# Patient Record
Sex: Male | Born: 1950 | Race: Black or African American | Hispanic: No | Marital: Single | Smoking: Former smoker
Health system: Southern US, Community
[De-identification: ages and names within clinical notes are randomized; demographics above are authoritative.]

## PROBLEM LIST (undated history)

## (undated) DIAGNOSIS — I10 Essential (primary) hypertension: Secondary | ICD-10-CM

## (undated) DIAGNOSIS — E7800 Pure hypercholesterolemia, unspecified: Secondary | ICD-10-CM

## (undated) DIAGNOSIS — M109 Gout, unspecified: Secondary | ICD-10-CM

## (undated) DIAGNOSIS — G43909 Migraine, unspecified, not intractable, without status migrainosus: Secondary | ICD-10-CM

## (undated) DIAGNOSIS — R7303 Prediabetes: Secondary | ICD-10-CM

## (undated) DIAGNOSIS — K219 Gastro-esophageal reflux disease without esophagitis: Secondary | ICD-10-CM

## (undated) DIAGNOSIS — I2699 Other pulmonary embolism without acute cor pulmonale: Secondary | ICD-10-CM

## (undated) DIAGNOSIS — G4733 Obstructive sleep apnea (adult) (pediatric): Secondary | ICD-10-CM

## (undated) HISTORY — PX: BACK SURGERY: SHX140

## (undated) HISTORY — PX: CARDIAC CATHETERIZATION: SHX172

## (undated) HISTORY — PX: APPENDECTOMY: SHX54

## (undated) HISTORY — PX: TONSILLECTOMY: SUR1361

## (undated) HISTORY — PX: NASAL SEPTUM SURGERY: SHX37

## (undated) HISTORY — DX: Obstructive sleep apnea (adult) (pediatric): G47.33

## (undated) HISTORY — PX: TESTICLE SURGERY: SHX794

---

## 2004-05-23 DIAGNOSIS — I2699 Other pulmonary embolism without acute cor pulmonale: Secondary | ICD-10-CM | POA: Insufficient documentation

## 2006-12-28 ENCOUNTER — Other Ambulatory Visit: Payer: Self-pay

## 2006-12-29 ENCOUNTER — Observation Stay: Payer: Self-pay | Admitting: *Deleted

## 2007-01-02 ENCOUNTER — Emergency Department: Payer: Self-pay | Admitting: Emergency Medicine

## 2007-01-02 ENCOUNTER — Other Ambulatory Visit: Payer: Self-pay

## 2007-01-13 ENCOUNTER — Other Ambulatory Visit: Payer: Self-pay

## 2007-01-13 ENCOUNTER — Emergency Department: Payer: Self-pay | Admitting: Emergency Medicine

## 2007-03-16 ENCOUNTER — Encounter (INDEPENDENT_AMBULATORY_CARE_PROVIDER_SITE_OTHER): Payer: Self-pay | Admitting: General Surgery

## 2007-03-16 ENCOUNTER — Observation Stay (HOSPITAL_COMMUNITY): Admission: EM | Admit: 2007-03-16 | Discharge: 2007-03-17 | Payer: Self-pay | Admitting: Family Medicine

## 2007-07-02 ENCOUNTER — Ambulatory Visit: Payer: Self-pay | Admitting: Family Medicine

## 2009-04-26 ENCOUNTER — Ambulatory Visit: Payer: Self-pay | Admitting: Family Medicine

## 2010-10-05 NOTE — Op Note (Signed)
[name] Whitaker, [name] Whitaker             ACCOUNT NO.:  [id number]   MEDICAL RECORD NO.:  [id number]          PATIENT TYPE:  INP   LOCATION:  5703                         FACILITY:  [facility]   PHYSICIAN:  Cherylynn Ridges, M.D.    DATE OF BIRTH:  [birth year]   DATE OF PROCEDURE:  03/16/2007  DATE OF DISCHARGE:                               OPERATIVE REPORT   PREOPERATIVE DIAGNOSIS:  Acute appendicitis.   POSTOPERATIVE DIAGNOSIS:  Acute appendicitis.   PROCEDURE:  Laparoscopic appendectomy.   SURGEON:  Cherylynn Ridges, M.D.   ANESTHESIA:  General endotracheal.   ESTIMATED BLOOD LOSS:  Less than 30 mL.   COMPLICATIONS:  None.   CONDITION:  Stable.   FINDINGS:  Acutely inflamed appendix without perforation with some  periappendiceal inflammation.   INDICATIONS FOR OPERATION:  The patient is a 60 year old gentleman with  a CT diagnosed acute appendicitis and clinical diagnosis to go along  with that who now comes in of laparoscopic appendectomy.   OPERATION:  The patient was taken to the operating room, placed on the  table in the supine position.  After an adequate general endotracheal  anesthetic was administered, he was prepped and draped in the usual  sterile manner, exposing the midline and the right upper quadrant and  the right lower quadrant.  A supraumbilical curvilinear incision was  made using a #11 blade and taking down to the midline fascia.  We were  able to incise superficially into this fascia using a #11 blade, making  an opening into the fascia.  We subsequently bluntly dissected down into  the peritoneal cavity using the surgeon's finger, and then subsequently  passed a pursestring suture of 0 Vicryl around the fascial opening.  This secured in the Hasson cannula which was subsequently passed.  Carbon dioxide gas was insufflated through the Hasson cannula into the  peritoneal cavity up to a maximal intraabdominal pressure of 15 mmHg.   With the Hasson cannula in place  and the carbon dioxide gas in place, we  placed the patient in Trendelenburg with the left side down.  A right  upper quadrant 5-mm cannula and a suprapubic 12-mm cannula passed under  direct vision with all cannulas in place.  We began the dissection.   We were able to manipulate the appendix which was in the right lower  quadrant using a grasper and a dissector.  We were able to dissect out  his adhesions up to the base of the cecum.  The mesoappendix, however,  was subposteriorly and medially, therefore, we decided to cauterize it  away from the appendix itself.  This was done using a dissector, taking  care not to have any significant bleeding.  We cauterized the  mesoappendix thoroughly in order to obtain hemostasis and also detach  the appendix until the base is clear for [place] to pass an Endo-GIA with 3.5-  mm closure or a blue staple across the base of the appendix and detach  it.  We retrieved it from the suprapubic cannula site with minimal  difficulty, then inspected the right lower quadrant for bleeding.  There  was no bleeding noted from the periappendiceal area or the mesoappendix.  We irrigated with about 700 to 800 mL of saline, then we aspirated out  fluid and gas from around the liver and the pelvis and removed all  cannulas.   The supraumbilical site was closed using a pursestring suture which was  in place.  We injected 0.25% Marcaine with epinephrine at all sites,  then we closed the skin at the suprapubic and the supraumbilical site  using a running subcuticular stitch of 5-0 Vicryl.  The lateral cannula  site was closed with Dermabond and we did apply Dermabond to the skin at  the supraumbilical and suprapubic site.  Sterile dressings were applied  including the Steri-Strips and Tegaderm.  Needle counts and sponge  counts and instrument counts were correct.      Cherylynn Ridges, M.D.  Electronically Signed     JOW/MEDQ  D:  03/16/2007  T:  03/16/2007  Job:   [id number]

## 2010-10-05 NOTE — H&P (Signed)
[name] Whitaker, [name] Whitaker             ACCOUNT NO.:  [id number]   MEDICAL RECORD NO.:  [id number]          PATIENT TYPE:  INP   LOCATION:  5703                         FACILITY:  [facility]   PHYSICIAN:  Cherylynn Ridges, M.D.    DATE OF BIRTH:  [birth year]   DATE OF ADMISSION:  03/15/2007  DATE OF DISCHARGE:                              HISTORY & PHYSICAL   IDENTIFICATION/CHIEF COMPLAINT:  The patient is a 60 year old male with  abdominal pain now localized to the right lower quadrant with  tenderness.  The CT scan demonstrating acute appendicitis who comes in  for an appendectomy.   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  He  stayed at work, although he only ate breakfast, did not have nausea or  vomiting.  The pain persisted throughout the day and worsened moving  from an epigastric location to the right lower quadrant and down towards  his thigh as the patient noted.  He had no fever or chills.  No  jaundice.  No diarrhea or constipation, but he did have a decrease in  his appetite and came into the emergency room after being seen in urgent  care where he was found to have acute appendicitis when the CT scan was  done and surgical consultation was obtained.   PAST MEDICAL HISTORY:  1. Hypertension.  2. Hypercholesterolemia.  3. Gout.  4. History of deep venous thrombosis.  5. History of sleep apnea.   PAST SURGICAL HISTORY:  1. He has had a partial palatectomy for sleep apnea.  2. Tonsils and adenoids.  3. Fixation of an ankle fracture several years ago.  4. 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   MEDICATIONS:  Include:  1. Niacin.  2. Simvastatin.  3. Hydrochlorothiazide.  Doses are unknown.   ALLERGIES:  HE IS ALLERGIC TO PENICILLIN AND HE GETS A  RASH.   SOCIAL HISTORY:  The patient is a vice principal.  He is a nonsmoker,  nondrinker.  He does not take any drugs.   REVIEW OF SYSTEMS:  GASTROINTESTINAL:  He has had no diarrhea,  constipation.  No fever or chills.  No nausea or vomiting.  He has had  no blood in his stools.  GENITOURINARY:  He has had no dysuria or  hematuria.   PHYSICAL EXAMINATION:  GENERAL:  He is a well-nourished, well-developed  gentleman, mildly overweight in no acute distress, although  uncomfortable, even at rest.  HEENT:  He is normocephalic and atraumatic and anicteric.  Mucous  membranes are moist and pink.  VITAL SIGNS:  His temperature is 97.6, pulse 71, blood pressure 138/89.  NECK:  His neck is supple with no palpable masses.  He has got no  bruits.  LUNGS:  Clear to auscultation.  No wheezes or rales.  CARDIAC EXAM:  Regular rhythm and rate  without murmurs.  ABDOMEN:  Distended but he has normoactive and perhaps hyperactive bowel  sounds with tenderness in the right lower quadrant.  No palpable masses.  No positive Rovsing sign.  RECTAL:  Rectal exam was not performed.  VASCULAR EXAM:  He has got normal pulses bilaterally.  He is slightly  swollen in the left lower extremity with good pulses.  NEUROLOGICALLY:  Cranial nerves II-XII are grossly intact.   LABORATORY STUDIES:  His white count is 12.8, hemoglobin is 15.8 with an  hematocrit of 47.0.  His electrolytes are within normal limits with the  exception of potassium which is 3.0.  BUN is 14, creatinine of 1.2.  A  12-lead EKG is normal.  Chest x-ray is pending.   IMPRESSION:  Acute appendicitis by CT scan and also clinically with  history and physical examination.   PLAN:  The plan is:  1. Give the patient IV antibiotics preoperatively.  2. Correct his potassium with two runs of K over one hour each.  3. Take him to the operating room for a laparoscopic appendectomy to      be performed as soon as possible.   The patient  understands the risks and benefits and he wishes to proceed.      Cherylynn Ridges, M.D.  Electronically Signed     JOW/MEDQ  D:  03/16/2007  T:  03/16/2007  Job:  [id number]

## 2010-10-05 NOTE — Discharge Summary (Signed)
NAMEBESSIE, [name] Whitaker             ACCOUNT NO.:  [id number]   MEDICAL RECORD NO.:  [id number]          PATIENT TYPE:  INP   LOCATION:  5703                         FACILITY:  [facility]   PHYSICIAN:  Revonda Standard L. Rennis Harding, N.P. DATE OF BIRTH:  1951/01/09   DATE OF ADMISSION:  03/16/2007  DATE OF DISCHARGE:  03/16/2007                               DISCHARGE SUMMARY   DISCHARGING PHYSICIAN:  Dr. Luisa Hart.   CHIEF COMPLAINT/REASON FOR ADMISSION:  [name] Whitaker is a 56-year male  patient, history of hypertension, who developed epigastric pain, later  more focal pain in the right lower quadrant, presented to the ER because  of severity of pain.  Was found to have elevated white count of 12,800.  A CT scan was performed that was consistent with acute appendicitis.  The patient was admitted by Dr. Lindie Spruce with a diagnosis of acute  appendicitis.   HOSPITAL COURSE:  The patient was admitted and taken from the ER to the  OR by Dr. Lindie Spruce, where he underwent a laparoscopic appendectomy for a  nonperforated acute appendicitis.  The patient tolerated the procedure  well and was sent back to the general floor to recover, noting that the  patient procedure's was complete a little after 3 a.m. on October 24th.   At 10:30 a.m. on March 16, 2007, the patient was somewhat groggy, but  doing otherwise well, tolerating a clear liquid diet.  His abdomen was  soft, slightly distended, but bowel sounds were present.  He had no  nausea or vomiting.  He was somewhat tender in the right lower quadrant  dressings were clean, dry and intact.  He was verbalizing that he wanted  to try a solid diet at lunch time.  Plans are if the patient tolerates  oral pain medicines, ambulation and a solid diet, that he can discharge  home later this evening.  Because of the inflammatory residual pain in  the right lower quadrant, the patient has also been given Toradol IV,  first dose starting today, and to continue until  discharged.   FINAL DISCHARGE DIAGNOSES:  1. Acute nonperforated appendicitis.  2. Status post laparoscopic appendectomy.  3. Chronic medical problems consisting of hypertension, sleep apnea,      deep venous thrombosis, dyslipidemia, and gout.   DISCHARGE MEDICATIONS:  The patient will resume his home medications of  allopurinol, aspirin, hydrochlorothiazide, niacin and Zocor.  No doses  available at time of dictation.  In addition, he has been given a  prescription for Vicodin 1-2 tablets every 4-6 hours as needed for pain,  dispensed #30.  In addition, he has been instructed to take over-the-  counter ibuprofen 2 tablets every 8 hours as needed in addition to  Vicodin for pain.  He is to take this with food.  Return to work 1-2  weeks; note specifies out of work 2 weeks, may return in 1 week if not  using Vicodin during the day for pain control.  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  Diet low-sodium/heart-healthy.   WOUND CARE:  Remove bandages in the morning.  May shower.  OTHER ACTIVITY:  Increase activity slowly.  May walk up steps.  No  lifting for 3 weeks.  No driving for 2 weeks.   FOLLOWUP APPOINTMENTS:  He needs to call Dr. Dixon Boos office to be seen  in 2 weeks.   OTHER INSTRUCTIONS:  1. He is to call the surgeon if a fever greater than 101 degrees      Fahrenheit.  2. New or increased belly pain.  3. Redness or drainage from wounds.  4. Nausea, vomiting or diarrhea.   This discharge is pending the patient tolerating advancement of diet,  oral pain medications, and ambulating without difficulty.      Allison L. Rennis Harding, N.P.     ALE/MEDQ  D:  03/16/2007  T:  03/17/2007  Job:  [id number]

## 2010-10-08 NOTE — Discharge Summary (Signed)
[name] Whitaker, [name] Whitaker             ACCOUNT NO.:  [id number]   MEDICAL RECORD NO.:  [id number]          PATIENT TYPE:  INP   LOCATION:  5703                         FACILITY:  [facility]   PHYSICIAN:  Maisie Fus A. Cornett, M.D.DATE OF BIRTH:  [birth year]   DATE OF ADMISSION:  03/15/2007  DATE OF DISCHARGE:  03/17/2007                               DISCHARGE SUMMARY   ADMITTING DIAGNOSIS:  Acute appendicitis.   DISCHARGE DIAGNOSIS:  Acute appendicitis.   PROCEDURE PERFORMED:  Laparoscopic appendectomy.   BRIEF HISTORY:  The patient is a 60 year old male admitted on March 16, 2007 with acute appendicitis. He underwent a laparoscopic  appendectomy by Dr. Jimmye Norman and was admitted to the hospital.   Postop course was unremarkable.  He was discharged home on postop day #1  in satisfactory condition tolerating his diet with no fevers and his  wounds were clean, dry and intact.   DISCHARGE INSTRUCTIONS:  He will follow-up in 1-2 weeks with Dr. Lindie Spruce  as an outpatient.  He will refrain from heavy lifting and straining for  the next 2 weeks and be discharged home on Vicodin for pain.   CONDITION ON DISCHARGE:  Improved.      Thomas A. Cornett, M.D.  Electronically Signed     TAC/MEDQ  D:  04/17/2007  T:  04/17/2007  Job:  [id number]

## 2011-03-02 LAB — URINALYSIS, ROUTINE W REFLEX MICROSCOPIC
Glucose, UA: NEGATIVE
Hgb urine dipstick: NEGATIVE
Ketones, ur: NEGATIVE
Nitrite: NEGATIVE
Specific Gravity, Urine: 1.02
pH: 6.5

## 2011-03-02 LAB — COMPREHENSIVE METABOLIC PANEL
ALT: 21
AST: 20
CO2: 32
Calcium: 8.7
GFR calc Af Amer: >60
GFR calc non Af Amer: >60
Total Protein: 7.1

## 2011-03-02 LAB — DIFFERENTIAL
Basophils Absolute: 0
Basophils Relative: 0
Eosinophils Absolute: 0.1
Lymphocytes Relative: 17
Lymphs Abs: 2.1
Monocytes Relative: 5
Neutro Abs: 9.8 — ABNORMAL HIGH

## 2011-03-02 LAB — CBC
HCT: 47.1
MCHC: 33.2
MCV: 85.8
Platelets: 257
RDW: 15.1 — ABNORMAL HIGH

## 2011-03-02 LAB — LIPASE, BLOOD: Lipase: 19

## 2011-05-05 ENCOUNTER — Emergency Department: Payer: Self-pay | Admitting: Emergency Medicine

## 2011-05-31 ENCOUNTER — Emergency Department: Payer: Self-pay | Admitting: Emergency Medicine

## 2011-05-31 LAB — CBC
HCT: 45.4 % (ref 40.0–52.0)
HGB: 14.7 g/dL (ref 13.0–18.0)
MCH: 29 pg (ref 26.0–34.0)
MCHC: 32.5 g/dL (ref 32.0–36.0)
MCV: 89 fL (ref 80–100)
RBC: 5.09 10*6/uL (ref 4.40–5.90)

## 2011-05-31 LAB — COMPREHENSIVE METABOLIC PANEL
Alkaline Phosphatase: 52 U/L (ref 50–136)
BUN: 15 mg/dL (ref 7–18)
Bilirubin,Total: 0.4 mg/dL (ref 0.2–1.0)
Chloride: 112 mmol/L — ABNORMAL HIGH (ref 98–107)
Creatinine: 1.19 mg/dL (ref 0.60–1.30)
EGFR (African American): >60
Osmolality: 294 (ref 275–301)
SGOT(AST): 16 U/L (ref 15–37)
SGPT (ALT): 20 U/L
Total Protein: 6.2 g/dL — ABNORMAL LOW (ref 6.4–8.2)

## 2011-07-14 ENCOUNTER — Ambulatory Visit: Payer: Self-pay | Admitting: Cardiology

## 2011-07-28 ENCOUNTER — Ambulatory Visit: Payer: Self-pay | Admitting: Cardiology

## 2011-10-24 ENCOUNTER — Emergency Department (HOSPITAL_COMMUNITY)
Admission: EM | Admit: 2011-10-24 | Discharge: 2011-10-24 | Disposition: A | Discharge status: Home / Self Care | Payer: BC Managed Care – PPO | Source: Home / Self Care

## 2011-10-24 ENCOUNTER — Encounter (HOSPITAL_COMMUNITY): Payer: Self-pay | Admitting: Cardiology

## 2011-10-24 ENCOUNTER — Encounter (HOSPITAL_COMMUNITY): Payer: Self-pay | Admitting: Emergency Medicine

## 2011-10-24 ENCOUNTER — Inpatient Hospital Stay (HOSPITAL_COMMUNITY)
Admission: EM | Admit: 2011-10-24 | Discharge: 2011-10-27 | DRG: 541 | Disposition: A | Discharge status: Home / Self Care | Payer: BC Managed Care – PPO | Attending: Internal Medicine | Admitting: Internal Medicine

## 2011-10-24 DIAGNOSIS — I82409 Acute embolism and thrombosis of unspecified deep veins of unspecified lower extremity: Secondary | ICD-10-CM | POA: Diagnosis present

## 2011-10-24 DIAGNOSIS — N182 Chronic kidney disease, stage 2 (mild): Secondary | ICD-10-CM | POA: Diagnosis present

## 2011-10-24 DIAGNOSIS — Z880 Allergy status to penicillin: Secondary | ICD-10-CM

## 2011-10-24 DIAGNOSIS — M109 Gout, unspecified: Secondary | ICD-10-CM | POA: Diagnosis present

## 2011-10-24 DIAGNOSIS — I2699 Other pulmonary embolism without acute cor pulmonale: Principal | ICD-10-CM | POA: Diagnosis present

## 2011-10-24 DIAGNOSIS — I129 Hypertensive chronic kidney disease with stage 1 through stage 4 chronic kidney disease, or unspecified chronic kidney disease: Secondary | ICD-10-CM | POA: Diagnosis present

## 2011-10-24 DIAGNOSIS — R0602 Shortness of breath: Secondary | ICD-10-CM

## 2011-10-24 DIAGNOSIS — I10 Essential (primary) hypertension: Secondary | ICD-10-CM

## 2011-10-24 DIAGNOSIS — E785 Hyperlipidemia, unspecified: Secondary | ICD-10-CM | POA: Diagnosis present

## 2011-10-24 DIAGNOSIS — I82402 Acute embolism and thrombosis of unspecified deep veins of left lower extremity: Secondary | ICD-10-CM

## 2011-10-24 DIAGNOSIS — Z8249 Family history of ischemic heart disease and other diseases of the circulatory system: Secondary | ICD-10-CM

## 2011-10-24 DIAGNOSIS — M7989 Other specified soft tissue disorders: Secondary | ICD-10-CM

## 2011-10-24 DIAGNOSIS — G4733 Obstructive sleep apnea (adult) (pediatric): Secondary | ICD-10-CM | POA: Diagnosis present

## 2011-10-24 DIAGNOSIS — E876 Hypokalemia: Secondary | ICD-10-CM | POA: Diagnosis present

## 2011-10-24 HISTORY — DX: Essential (primary) hypertension: I10

## 2011-10-24 HISTORY — DX: Other pulmonary embolism without acute cor pulmonale: I26.99

## 2011-10-24 HISTORY — DX: Pure hypercholesterolemia, unspecified: E78.00

## 2011-10-24 HISTORY — DX: Migraine, unspecified, not intractable, without status migrainosus: G43.909

## 2011-10-24 HISTORY — DX: Gout, unspecified: M10.9

## 2011-10-24 LAB — CBC
MCH: 29.8 pg (ref 26.0–34.0)
MCHC: 35.6 g/dL (ref 30.0–36.0)
MCV: 83.7 fL (ref 78.0–100.0)
RDW: 13.8 % (ref 11.5–15.5)
WBC: 8.3 10*3/uL (ref 4.0–10.5)

## 2011-10-24 LAB — COMPREHENSIVE METABOLIC PANEL
ALT: 21 U/L (ref 0–53)
AST: 22 U/L (ref 0–37)
Alkaline Phosphatase: 64 U/L (ref 39–117)
BUN: 16 mg/dL (ref 6–23)
Calcium: 9 mg/dL (ref 8.4–10.5)
Chloride: 102 mEq/L (ref 96–112)
Creatinine, Ser: 1.34 mg/dL (ref 0.50–1.35)
GFR calc Af Amer: 65 mL/min — ABNORMAL LOW (ref >90)
Total Protein: 7.3 g/dL (ref 6.0–8.3)

## 2011-10-24 LAB — DIFFERENTIAL
Basophils Absolute: 0 10*3/uL (ref 0.0–0.1)
Lymphocytes Relative: 33 % (ref 12–46)
Lymphs Abs: 2.7 10*3/uL (ref 0.7–4.0)
Neutro Abs: 4.9 10*3/uL (ref 1.7–7.7)

## 2011-10-24 LAB — POCT I-STAT TROPONIN I: Troponin i, poc: 0 ng/mL (ref 0.00–0.08)

## 2011-10-24 NOTE — ED Notes (Signed)
Patient complaining of left leg pain and swelling since yesterday; patient reports history of pulmonary embolism in the past.  Patient also reporting some shortness of breath that began yesterday.  Denies chest pain.

## 2011-10-24 NOTE — ED Provider Notes (Signed)
[name] Whitaker is a 61 y.o. male who presents to Urgent Care today for left leg swelling starting one day ago. Left calf swelling and pain. Patient notes some shortness of breath on exertion. He denies any chest pains or palpitations. He is a pertinent past history for pulmonary embolism several years ago without apparent cause.  He has no injury to his leg and feels well otherwise. Patient is right-handed   PMH reviewed. Significant for past history of pulmonary embolism History  Substance Use Topics  . Smoking status: Never Smoker   . Smokeless tobacco: Not on file  . Alcohol Use: No   ROS as above Medications reviewed. No current facility-administered medications for this encounter.   Current Outpatient Prescriptions  Medication Sig Dispense Refill  . allopurinol (ZYLOPRIM) 300 MG tablet Take 300 mg by mouth daily.      Marland Kitchen aspirin 81 MG tablet Take 81 mg by mouth daily.      . finasteride (PROSCAR) 5 MG tablet Take 5 mg by mouth daily.      Marland Kitchen gabapentin (NEURONTIN) 300 MG capsule Take 300 mg by mouth 2 (two) times daily.      Marland Kitchen loratadine (CLARITIN) 10 MG tablet Take 10 mg by mouth daily.      Marland Kitchen losartan-hydrochlorothiazide (HYZAAR) 100-25 MG per tablet Take 1 tablet by mouth daily.      . niacin 500 MG tablet Take 500 mg by mouth daily with breakfast.      . propranolol (INDERAL) 60 MG tablet Take 60 mg by mouth 2 (two) times daily.      . simvastatin (ZOCOR) 80 MG tablet Take 80 mg by mouth at bedtime.        Exam:  BP 125/78  Pulse 80  Temp(Src) 98.1 F (36.7 C) (Oral)  Resp 18  SpO2 97% Gen: Well NAD HEENT: EOMI,  MMM Lungs: CTABL Nl WOB Heart: RRR no MRG Exts: Non edematous BL  LE, warm and well perfused. Left calf diameter 46 cm right calf diameter 43 cm  No results found for this or any previous visit (from the past 24 hour(s)). No results found.  Assessment and Plan: 61 y.o. male with leg swelling concern for DVT.  Patient's left calf is 3 cm larger than his  right calf, and he is a pertinent past history for DVT. He has mild shortness of breath but no tachycardia tachypnea or decreased oxygen saturation. Pulmonary embolism is a possibility. I discussed the options with this patient and he agrees with transfer to the emergency room for further evaluation.     Rodolph Bong, MD 10/24/11 [phone]

## 2011-10-24 NOTE — ED Notes (Signed)
Pt reports left leg swelling that started yesterday. Calf tenderness that started approx one hour ago. Denies fever. Has a history of pulmonary emboli 06/2004. He had been off coumadin since 12/2004.

## 2011-10-24 NOTE — ED Provider Notes (Signed)
Medical screening examination/treatment/procedure(s) were performed by a resident physician and as supervising physician I was immediately available for consultation/collaboration.  Leslee Home, M.D.   Reuben Likes, MD 10/24/11 [id number]

## 2011-10-24 NOTE — ED Provider Notes (Signed)
History     CSN: [id number]  Arrival date & time 10/24/11  4098   First MD Initiated Contact with Patient 10/24/11 2256      Chief Complaint  Patient presents with  . Leg Pain  . Shortness of Breath    (Consider location/radiation/quality/duration/timing/severity/associated sxs/prior treatment) HPI Comments: 61 year old male with a history of hypertension, hypercholesterolemia and pulmonary embolism in 2006 who presents with recurrent left leg swelling. Onset was yesterday, gradually worsening, associated with shortness of breath. Nothing makes this better or worse, currently anticoagulated with only aspirin. He states he has no chest pain, no back pain, no difficulty ambulating or dyspnea on exertion. He has no other risk factors for pulmonary embolism other than prior pulmonary embolism including no travel, no trauma, no injuries, no immobilization, no hormone therapy, no tobacco use.  He denies fevers, cough, chills, nausea, vomiting, rashes, diarrhea  Patient is a 61 y.o. male presenting with leg pain and shortness of breath. The history is provided by the patient and medical records.  Leg Pain   Shortness of Breath  Associated symptoms include shortness of breath.    Past Medical History  Diagnosis Date  . Gout   . Hypercholesteremia   . Pulmonary embolism   . Hypertension   . Migraine     Past Surgical History  Procedure Date  . Appendectomy   . Nasal septum surgery   . Tonsillectomy   . Testicle surgery   . Cardiac catheterization     History reviewed. No pertinent family history.  History  Substance Use Topics  . Smoking status: Never Smoker   . Smokeless tobacco: Not on file  . Alcohol Use: No      Review of Systems  Respiratory: Positive for shortness of breath.   All other systems reviewed and are negative.    Allergies  Penicillins and Amlodipine  Home Medications   Current Outpatient Rx  Name Route Sig Dispense Refill  . ALLOPURINOL 300  MG PO TABS Oral Take 300 mg by mouth daily.    . ASPIRIN 81 MG PO TABS Oral Take 81 mg by mouth daily.    Marland Kitchen FINASTERIDE 5 MG PO TABS Oral Take 5 mg by mouth daily.    Marland Kitchen GABAPENTIN 300 MG PO CAPS Oral Take 300 mg by mouth 2 (two) times daily.    Marland Kitchen LORATADINE 10 MG PO TABS Oral Take 10 mg by mouth daily.    Marland Kitchen LOSARTAN POTASSIUM-HCTZ 100-25 MG PO TABS Oral Take 1 tablet by mouth daily.    Marland Kitchen NIACIN 500 MG PO TABS Oral Take 500 mg by mouth daily with breakfast.    . PROPRANOLOL HCL 60 MG PO TABS Oral Take 60 mg by mouth 2 (two) times daily.    Marland Kitchen SIMVASTATIN 80 MG PO TABS Oral Take 80 mg by mouth at bedtime.      BP 173/111  Pulse 70  Temp(Src) 98.2 F (36.8 C) (Oral)  Resp 16  Ht 5\' 11"  (1.803 m)  Wt 255 lb (115.667 kg)  BMI 35.57 kg/m2  SpO2 98%  Physical Exam  Nursing note and vitals reviewed. Constitutional: He appears well-developed and well-nourished. No distress.  HENT:  Head: Normocephalic and atraumatic.  Mouth/Throat: Oropharynx is clear and moist. No oropharyngeal exudate.  Eyes: Conjunctivae and EOM are normal. Pupils are equal, round, and reactive to light. Right eye exhibits no discharge. Left eye exhibits no discharge. No scleral icterus.  Neck: Normal range of motion. Neck supple. No JVD present.  No thyromegaly present.  Cardiovascular: Normal rate, regular rhythm, normal heart sounds and intact distal pulses.  Exam reveals no gallop and no friction rub.   No murmur heard. Pulmonary/Chest: Effort normal and breath sounds normal. No respiratory distress. He has no wheezes. He has no rales.  Abdominal: Soft. Bowel sounds are normal. He exhibits no distension and no mass. There is no tenderness.  Musculoskeletal: Normal range of motion. He exhibits edema ( Scant pitting edema of the left lower extremity, there is asymmetry of the lower extremities with left greater than right. There is normal pulses at the left foot including the dorsalis pedis and posterior tibial). He  exhibits no tenderness.  Lymphadenopathy:    He has no cervical adenopathy.  Neurological: He is alert. Coordination normal.       Normal speech, strength, sensation, motor, extraocular movements  Skin: Skin is warm and dry. No rash noted. No erythema.  Psychiatric: He has a normal mood and affect. His behavior is normal.    ED Course  Procedures (including critical care time)  ED ECG REPORT   Date: 10/25/2011   Rate: 73  Rhythm: normal sinus rhythm  QRS Axis: left  Intervals: normal  ST/T Wave abnormalities: nonspecific T wave changes  Conduction Disutrbances:none  Narrative Interpretation:   Old EKG Reviewed: No significant changes since 03/16/2007   Labs Reviewed  COMPREHENSIVE METABOLIC PANEL - Abnormal; Notable for the following:    Potassium 3.4 (*)    Glucose, Bld 132 (*)    GFR calc non Af Amer 56 (*)    GFR calc Af Amer 65 (*)    All other components within normal limits  CBC  DIFFERENTIAL  POCT I-STAT TROPONIN I  APTT  PROTIME-INR   Ct Angio Chest W/cm &/or Wo Cm  10/25/2011  *RADIOLOGY REPORT*  Clinical Data: Shortness of breath and leg swelling.  CT ANGIOGRAPHY CHEST  Technique:  Multidetector CT imaging of the chest using the standard protocol during bolus administration of intravenous contrast. Multiplanar reconstructed images including MIPs were obtained and reviewed to evaluate the vascular anatomy.  Contrast: OMNIPAQUE IOHEXOL 350 MG/ML SOLN  Comparison: None  Findings: The chest wall is unremarkable.  No supraclavicular or axillary lymphadenopathy.  Small scattered lymph nodes are noted. The bony thorax is intact.  Mild degenerative changes involving the thoracic spine.  No destructive bony lesions or spinal canal compromise.  The heart is normal in size.  No pericardial effusion.  No mediastinal or hilar lymphadenopathy.  Small scattered lymph nodes are noted.  There is mild diffuse fusiform enlargement of the ascending aorta with maximal measurement of  4.4 cm at the level of the right main pulmonary artery.  No dissection.  No significant atherosclerotic calcifications.  The esophagus is grossly normal.  The pulmonary arteries are fairly well opacified.   There are small bilateral pulmonary emboli.  Examination of the lung parenchyma demonstrates mild dependent atelectasis but no infiltrates, edema or effusions.  No worrisome pulmonary mass lesions or nodules.  The upper abdomen is unremarkable.  IMPRESSION:  1.  Small bilateral pulmonary emboli. 2.  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 3.  Dependent atelectasis without infiltrate or effusion.  Original Report Authenticated By: P. Loralie Champagne, M.D.     1. Pulmonary embolism   2. DVT (deep venous thrombosis), left       MDM  Overall the patient appears to have no significant cardiac instability, his pulse is 75, respirations are  unlabored and even at 18, sats are 97% on room air and blood pressure is normal, temperature is normal. EKG shows nonspecific T waves, there is no significant change from prior EKG from October 2008. Laboratory data shows normal blood counts, slight elevated creatinine 1.3, patient will be hydrated and had a CT angiogram of his chest.  The patient had undergone anticoagulation therapy for 6 months in 2006, and none since that time.  I have personally evaluated the CT scan and find multiple PE's.  Will d/w hospitalist re: admission.    Heparin gtt ordered.  CC provided for deep venous thrombosis with venous thromboembolism pulmonary embolus.  D/w Toniann Fail - will admit.  CRITICAL CARE Performed by: Vida Roller   Total critical care time: 35  Critical care time was exclusive of separately billable procedures and treating other patients.  Critical care was necessary to treat or prevent imminent or life-threatening deterioration.  Critical care was time spent personally by me on the following activities:  development of treatment plan with patient and/or surrogate as well as nursing, discussions with consultants, evaluation of patient's response to treatment, examination of patient, obtaining history from patient or surrogate, ordering and performing treatments and interventions, ordering and review of laboratory studies, ordering and review of radiographic studies, pulse oximetry and re-evaluation of patient's condition.       Vida Roller, MD 10/25/11 [id number]

## 2011-10-25 ENCOUNTER — Encounter (HOSPITAL_COMMUNITY): Payer: Self-pay | Admitting: Internal Medicine

## 2011-10-25 ENCOUNTER — Emergency Department (HOSPITAL_COMMUNITY): Payer: BC Managed Care – PPO

## 2011-10-25 DIAGNOSIS — I10 Essential (primary) hypertension: Secondary | ICD-10-CM

## 2011-10-25 DIAGNOSIS — E782 Mixed hyperlipidemia: Secondary | ICD-10-CM

## 2011-10-25 DIAGNOSIS — I2699 Other pulmonary embolism without acute cor pulmonale: Secondary | ICD-10-CM

## 2011-10-25 DIAGNOSIS — M109 Gout, unspecified: Secondary | ICD-10-CM | POA: Diagnosis present

## 2011-10-25 DIAGNOSIS — M7989 Other specified soft tissue disorders: Secondary | ICD-10-CM

## 2011-10-25 DIAGNOSIS — E785 Hyperlipidemia, unspecified: Secondary | ICD-10-CM | POA: Diagnosis present

## 2011-10-25 HISTORY — DX: Essential (primary) hypertension: I10

## 2011-10-25 LAB — APTT: aPTT: 29 seconds (ref 24–37)

## 2011-10-25 LAB — COMPREHENSIVE METABOLIC PANEL
Albumin: 3.7 g/dL (ref 3.5–5.2)
Alkaline Phosphatase: 60 U/L (ref 39–117)
BUN: 14 mg/dL (ref 6–23)
CO2: 25 mEq/L (ref 19–32)
Chloride: 102 mEq/L (ref 96–112)
Creatinine, Ser: 1.21 mg/dL (ref 0.50–1.35)
GFR calc non Af Amer: 64 mL/min — ABNORMAL LOW (ref >90)
Potassium: 3.1 mEq/L — ABNORMAL LOW (ref 3.5–5.1)
Total Bilirubin: 0.6 mg/dL (ref 0.3–1.2)

## 2011-10-25 LAB — CBC
MCH: 29 pg (ref 26.0–34.0)
MCV: 85 fL (ref 78.0–100.0)
Platelets: 206 10*3/uL (ref 150–400)
RDW: 14.2 % (ref 11.5–15.5)
WBC: 9.5 10*3/uL (ref 4.0–10.5)

## 2011-10-25 LAB — PROTIME-INR: INR: 1.11 (ref 0.00–1.49)

## 2011-10-25 LAB — HEPARIN LEVEL (UNFRACTIONATED): Heparin Unfractionated: 0.57 IU/mL (ref 0.30–0.70)

## 2011-10-25 MED ORDER — PROPRANOLOL HCL 60 MG PO TABS
60.0000 mg | ORAL_TABLET | ORAL | Status: AC
  Filled 2011-10-25: qty 1

## 2011-10-25 MED ORDER — LORATADINE 10 MG PO TABS
10.0000 mg | ORAL_TABLET | Freq: Every day | ORAL | Status: DC
  Administered 2011-10-25 – 2011-10-27 (×3): 10 mg via ORAL
  Filled 2011-10-25 (×3): qty 1

## 2011-10-25 MED ORDER — SODIUM CHLORIDE 0.9 % IV SOLN
INTRAVENOUS | Status: AC
  Administered 2011-10-25: 04:00:00 via INTRAVENOUS

## 2011-10-25 MED ORDER — WARFARIN - PHARMACIST DOSING INPATIENT: Freq: Every day | Status: DC

## 2011-10-25 MED ORDER — KETOROLAC TROMETHAMINE 60 MG/2ML IM SOLN
30.0000 mg | Freq: Once | INTRAMUSCULAR | Status: AC
  Administered 2011-10-25: 30 mg via INTRAMUSCULAR
  Filled 2011-10-25: qty 2

## 2011-10-25 MED ORDER — ATORVASTATIN CALCIUM 40 MG PO TABS
40.0000 mg | ORAL_TABLET | Freq: Every day | ORAL | Status: DC
  Administered 2011-10-25 – 2011-10-26 (×2): 40 mg via ORAL
  Filled 2011-10-25 (×3): qty 1

## 2011-10-25 MED ORDER — ONDANSETRON HCL 4 MG PO TABS: 4.0000 mg | ORAL_TABLET | Freq: Four times a day (QID) | ORAL | Status: DC | PRN

## 2011-10-25 MED ORDER — PROPRANOLOL HCL 60 MG PO TABS
60.0000 mg | ORAL_TABLET | Freq: Two times a day (BID) | ORAL | Status: DC
  Administered 2011-10-25 – 2011-10-27 (×5): 60 mg via ORAL
  Filled 2011-10-25 (×7): qty 1

## 2011-10-25 MED ORDER — FINASTERIDE 5 MG PO TABS
5.0000 mg | ORAL_TABLET | Freq: Every day | ORAL | Status: DC
  Administered 2011-10-25 – 2011-10-27 (×3): 5 mg via ORAL
  Filled 2011-10-25 (×3): qty 1

## 2011-10-25 MED ORDER — HYDRALAZINE HCL 20 MG/ML IJ SOLN
10.0000 mg | INTRAMUSCULAR | Status: DC | PRN
  Filled 2011-10-25: qty 0.5

## 2011-10-25 MED ORDER — ACETAMINOPHEN 325 MG PO TABS
650.0000 mg | ORAL_TABLET | Freq: Four times a day (QID) | ORAL | Status: DC | PRN
  Administered 2011-10-25: 650 mg via ORAL
  Filled 2011-10-25: qty 2

## 2011-10-25 MED ORDER — GABAPENTIN 300 MG PO CAPS
300.0000 mg | ORAL_CAPSULE | ORAL | Status: AC
  Filled 2011-10-25: qty 1

## 2011-10-25 MED ORDER — ACETAMINOPHEN 650 MG RE SUPP: 650.0000 mg | Freq: Four times a day (QID) | RECTAL | Status: DC | PRN

## 2011-10-25 MED ORDER — ONDANSETRON HCL 4 MG/2ML IJ SOLN: 4.0000 mg | Freq: Four times a day (QID) | INTRAMUSCULAR | Status: DC | PRN

## 2011-10-25 MED ORDER — NIACIN 500 MG PO TABS
500.0000 mg | ORAL_TABLET | Freq: Every day | ORAL | Status: DC
  Administered 2011-10-25 – 2011-10-27 (×3): 500 mg via ORAL
  Filled 2011-10-25 (×5): qty 1

## 2011-10-25 MED ORDER — HYDROCHLOROTHIAZIDE 25 MG PO TABS
25.0000 mg | ORAL_TABLET | Freq: Every day | ORAL | Status: DC
  Administered 2011-10-25 – 2011-10-27 (×3): 25 mg via ORAL
  Filled 2011-10-25 (×3): qty 1

## 2011-10-25 MED ORDER — IOHEXOL 350 MG/ML SOLN
100.0000 mL | Freq: Once | INTRAVENOUS | Status: AC | PRN
  Administered 2011-10-25: 100 mL via INTRAVENOUS

## 2011-10-25 MED ORDER — HEPARIN BOLUS VIA INFUSION
4000.0000 [IU] | Freq: Once | INTRAVENOUS | Status: AC
  Administered 2011-10-25: 4000 [IU] via INTRAVENOUS

## 2011-10-25 MED ORDER — LOSARTAN POTASSIUM 50 MG PO TABS
100.0000 mg | ORAL_TABLET | Freq: Every day | ORAL | Status: DC
  Administered 2011-10-25 – 2011-10-27 (×3): 100 mg via ORAL
  Filled 2011-10-25 (×3): qty 2

## 2011-10-25 MED ORDER — LOSARTAN POTASSIUM-HCTZ 100-25 MG PO TABS: 1.0000 | ORAL_TABLET | Freq: Every day | ORAL | Status: DC

## 2011-10-25 MED ORDER — WARFARIN SODIUM 10 MG PO TABS
10.0000 mg | ORAL_TABLET | Freq: Once | ORAL | Status: AC
  Administered 2011-10-25: 10 mg via ORAL
  Filled 2011-10-25: qty 1

## 2011-10-25 MED ORDER — HEPARIN (PORCINE) IN NACL 100-0.45 UNIT/ML-% IJ SOLN
1300.0000 [IU]/h | INTRAMUSCULAR | Status: AC
  Administered 2011-10-25: 1500 [IU]/h via INTRAVENOUS
  Administered 2011-10-25: 1600 [IU]/h via INTRAVENOUS
  Administered 2011-10-26: 1300 [IU]/h via INTRAVENOUS
  Filled 2011-10-25 (×4): qty 250

## 2011-10-25 MED ORDER — ALLOPURINOL 300 MG PO TABS
300.0000 mg | ORAL_TABLET | Freq: Every day | ORAL | Status: DC
  Administered 2011-10-25 – 2011-10-27 (×3): 300 mg via ORAL
  Filled 2011-10-25 (×3): qty 1

## 2011-10-25 MED ORDER — POTASSIUM CHLORIDE CRYS ER 20 MEQ PO TBCR
40.0000 meq | EXTENDED_RELEASE_TABLET | Freq: Once | ORAL | Status: AC
  Administered 2011-10-25: 40 meq via ORAL
  Filled 2011-10-25: qty 2

## 2011-10-25 MED ORDER — ONDANSETRON HCL 4 MG/2ML IJ SOLN: 4.0000 mg | Freq: Three times a day (TID) | INTRAMUSCULAR | Status: DC | PRN

## 2011-10-25 MED ORDER — SODIUM CHLORIDE 0.9 % IV SOLN: INTRAVENOUS | Status: DC

## 2011-10-25 MED ORDER — SODIUM CHLORIDE 0.9 % IJ SOLN
3.0000 mL | Freq: Two times a day (BID) | INTRAMUSCULAR | Status: DC
  Administered 2011-10-25 – 2011-10-26 (×3): 3 mL via INTRAVENOUS

## 2011-10-25 MED ORDER — BIOTENE DRY MOUTH MT LIQD: 15.0000 mL | OROMUCOSAL | Status: DC | PRN

## 2011-10-25 MED ORDER — GABAPENTIN 300 MG PO CAPS
300.0000 mg | ORAL_CAPSULE | Freq: Two times a day (BID) | ORAL | Status: DC
  Administered 2011-10-25 – 2011-10-27 (×5): 300 mg via ORAL
  Filled 2011-10-25 (×7): qty 1

## 2011-10-25 MED ORDER — ENALAPRILAT 1.25 MG/ML IV SOLN
1.2500 mg | Freq: Once | INTRAVENOUS | Status: AC
  Administered 2011-10-25: 1.25 mg via INTRAVENOUS
  Filled 2011-10-25: qty 1

## 2011-10-25 NOTE — Progress Notes (Signed)
ANTICOAGULATION CONSULT NOTE - Initial Consult  Pharmacy Consult for Heparin and Coumadin Indication: pulmonary embolus  Allergies  Allergen Reactions  . Penicillins Shortness Of Breath  . Amlodipine Swelling    Patient Measurements: Height: 5\' 10"  (177.8 cm) Weight: 256 lb 2.8 oz (116.2 kg) IBW/kg (Calculated) : 73  Heparin Dosing Weight: 100  Vital Signs: Temp: 98.5 F (36.9 C) (06/04 0343) Temp src: Oral (06/04 0343) BP: 160/90 mmHg (06/04 0343) Pulse Rate: 95  (06/04 0343)  Labs:  Basename 10/25/11 0102 10/24/11 1945  HGB -- 15.5  HCT -- 43.5  PLT -- 199  APTT 29 --  LABPROT 14.5 --  INR 1.11 --  HEPARINUNFRC -- --  CREATININE -- 1.34  CKTOTAL -- --  CKMB -- --  TROPONINI -- --    Estimated Creatinine Clearance: 75.8 ml/min (by C-G formula based on Cr of 1.34).   Medical History: Past Medical History  Diagnosis Date  . Gout   . Hypercholesteremia   . Pulmonary embolism   . Hypertension   . Migraine     Medications:  Prescriptions prior to admission  Medication Sig Dispense Refill  . allopurinol (ZYLOPRIM) 300 MG tablet Take 300 mg by mouth daily.      Marland Kitchen aspirin 81 MG tablet Take 81 mg by mouth daily.      . finasteride (PROSCAR) 5 MG tablet Take 5 mg by mouth daily.      Marland Kitchen gabapentin (NEURONTIN) 300 MG capsule Take 300 mg by mouth 2 (two) times daily.      Marland Kitchen loratadine (CLARITIN) 10 MG tablet Take 10 mg by mouth daily.      Marland Kitchen losartan-hydrochlorothiazide (HYZAAR) 100-25 MG per tablet Take 1 tablet by mouth daily.      . niacin 500 MG tablet Take 500 mg by mouth daily with breakfast.      . propranolol (INDERAL) 60 MG tablet Take 60 mg by mouth 2 (two) times daily.      . simvastatin (ZOCOR) 80 MG tablet Take 80 mg by mouth at bedtime.        Assessment: 60 yo male with PE for anticoagulation.  Heparin 4000 units IV bolus, 1600 units/hr started in ED at 0130.  Goal of Therapy:  INR 2-3 Heparin level 0.3-0.7 units/ml Monitor platelets by  anticoagulation protocol: Yes   Plan:  Continue Heparin 1600 units/hr Coumadin 10 mg po tonight  Eddie Candle 10/25/2011,4:03 AM

## 2011-10-25 NOTE — ED Notes (Signed)
Patient transported to CT 

## 2011-10-25 NOTE — Progress Notes (Signed)
ANTICOAGULATION CONSULT NOTE - Follow Up Consult  Pharmacy Consult for Heparin Indication: PE  Allergies  Allergen Reactions  . Penicillins Shortness Of Breath  . Amlodipine Swelling    Patient Measurements: Height: 5\' 10"  (177.8 cm) Weight: 256 lb 2.8 oz (116.2 kg) IBW/kg (Calculated) : 73  Heparin Dosing Weight: 98.7kg  Vital Signs: Temp: 98.5 F (36.9 C) (06/04 0343) Temp src: Oral (06/04 0343) BP: 160/90 mmHg (06/04 0343) Pulse Rate: 95  (06/04 0343)  Labs:  Basename 10/25/11 0916 10/25/11 0630 10/25/11 0102 10/24/11 1945  HGB -- 14.7 -- 15.5  HCT -- 43.1 -- 43.5  PLT -- 206 -- 199  APTT -- -- 29 --  LABPROT -- -- 14.5 --  INR -- -- 1.11 --  HEPARINUNFRC 0.72* -- -- --  CREATININE -- 1.21 -- 1.34  CKTOTAL -- -- -- --  CKMB -- -- -- --  TROPONINI -- -- -- --    Estimated Creatinine Clearance: 84 ml/min (by C-G formula based on Cr of 1.21).   Medications:  Heparin 1600 units/hr  Assessment: 61yom on heparin bridging to Coumadin (Day 1 of minimum 5 Day overlap) for CTA confirmed PE. Heparin level (0.72) is just above goal range - will decrease rate and check 6hr follow-up level. Noted possible plans to switch to Xarelto - first dose can be given at time of heparin discontinuation.  - H/H and Plts wnl - No significant bleeding reported  Goal of Therapy:  INR 2-3 Heparin level 0.3-0.7 units/ml Monitor platelets by anticoagulation protocol: Yes   Plan:  1. Decrease heparin drip to 1500 units/hr (15 ml/hr) 2. Check heparin level 6 hours after rate decrease 3. Continue daily heparin level, INR and CBC  Asiah Befort, Dannielle Karvonen 10/25/2011,10:10 AM

## 2011-10-25 NOTE — Progress Notes (Signed)
ANTICOAGULATION CONSULT NOTE - Follow Up Consult  Pharmacy Consult for Heparin Indication: PE  Allergies  Allergen Reactions  . Penicillins Shortness Of Breath  . Amlodipine Swelling    Patient Measurements: Height: 5\' 10"  (177.8 cm) Weight: 256 lb 2.8 oz (116.2 kg) IBW/kg (Calculated) : 73  Heparin Dosing Weight: 98.7kg  Vital Signs: Temp: 98.2 F (36.8 C) (06/04 1407) Temp src: Oral (06/04 1407) BP: 152/93 mmHg (06/04 1407) Pulse Rate: 63  (06/04 1407)  Labs:  Basename 10/25/11 1729 10/25/11 0916 10/25/11 0630 10/25/11 0102 10/24/11 1945  HGB -- -- 14.7 -- 15.5  HCT -- -- 43.1 -- 43.5  PLT -- -- 206 -- 199  APTT -- -- -- 29 --  LABPROT -- -- -- 14.5 --  INR -- -- -- 1.11 --  HEPARINUNFRC 0.57 0.72* -- -- --  CREATININE -- -- 1.21 -- 1.34  CKTOTAL -- -- -- -- --  CKMB -- -- -- -- --  TROPONINI -- -- -- -- --    Estimated Creatinine Clearance: 84 ml/min (by C-G formula based on Cr of 1.21).   Medications:  Heparin 1500 units/hr  Assessment: 61yom on heparin bridging to Coumadin (Day 1 of minimum 5 Day overlap) for CTA confirmed PE. Heparin level (0.72) down to 0.57 now in goal range.  Goal of Therapy:  INR 2-3 Heparin level 0.3-0.7 units/ml Monitor platelets by anticoagulation protocol: Yes   Plan:  Continue heparin at 1500 units/hr and check next heparin level with am labs.  Merilynn Finland, [employer] 10/25/2011,6:51 PM
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
## 2011-10-25 NOTE — Care Management Note (Unsigned)
    Page 1 of 1   10/25/2011     1:39:54 PM   CARE MANAGEMENT NOTE 10/25/2011  Patient:  [name] Whitaker,[name] Whitaker   Account Number:  [id number]  Date Initiated:  10/25/2011  Documentation initiated by:  SIMMONS,Bracha Frankowski  Subjective/Objective Assessment:   ADMITTED WITH PULMONARY EMBOLISM; LIVES AT HOME ALONE, STILL WORKS; HAS FAMILY SUPPORT;  IPTA.     Action/Plan:   DISCHARGE PLANNING INITIATED.   Anticipated DC Date:  10/26/2011   Anticipated DC Plan:  HOME/SELF CARE      DC Planning Services  CM consult      Choice offered to / List presented to:             Status of service:  In process, will continue to follow Medicare Important Message given?   (If response is "NO", the following Medicare IM given date fields will be blank) Date Medicare IM given:   Date Additional Medicare IM given:    Discharge Disposition:    Per UR Regulation:  Reviewed for med. necessity/level of care/duration of stay  If discussed at Long Length of Stay Meetings, dates discussed:    Comments:  10/25/11  1339  [name] Whitaker SIMMONS RN, BSN [phone] MEDICATION IS COVERED, NO AUTH REQUIRED, CO-PAY AT RETAIL $40.00  10/25/11  1042  [name] Whitaker SIMMONS RN, BSN [phone] BENEFITS CHECK IN PROGRESS FOR XARELTO PRICING.

## 2011-10-25 NOTE — Progress Notes (Signed)
Pt placed on dpap of 5cmh2o @ 0540

## 2011-10-25 NOTE — H&P (Signed)
[name] Whitaker is an 61 y.o. male.   PCP - Dr.Javed Mahsoud in [place]. Chief Complaint: Shortness of breath. HPI: 61 year old male with previous history of pulmonary embolism in 2006 presented to the ER because of sudden onset of left leg swelling and shortness of breath. Patient states he noticed the swelling and shortness of breath only from yesterday. He has exertional shortness of breath denies any chest pain fever chills cough or phlegm. He denies any recent travel on a recent surgery. Has had cardiac catheter in February of this year which was normal as per patient. Patient had a CT chest which shows embolism has been admitted for further workup.  Past Medical History  Diagnosis Date  . Gout   . Hypercholesteremia   . Pulmonary embolism   . Hypertension   . Migraine     Past Surgical History  Procedure Date  . Appendectomy   . Nasal septum surgery   . Tonsillectomy   . Testicle surgery   . Cardiac catheterization     Family History  Problem Relation Age of Onset  . Hypertension Daughter    Social History:  reports that he has never smoked. He does not have any smokeless tobacco history on file. He reports that he does not drink alcohol or use illicit drugs.  Allergies:  Allergies  Allergen Reactions  . Penicillins Shortness Of Breath  . Amlodipine Swelling     (Not in a hospital admission)  Results for orders placed during the hospital encounter of 10/24/11 (from the past 48 hour(s))  CBC     Status: Normal   Collection Time   10/24/11  7:45 PM      Component Value Range Comment   WBC 8.3  4.0 - 10.5 (K/uL)    RBC 5.20  4.22 - 5.81 (MIL/uL)    Hemoglobin 15.5  13.0 - 17.0 (g/dL)    HCT 33.8  25.0 - 53.9 (%)    MCV 83.7  78.0 - 100.0 (fL)    MCH 29.8  26.0 - 34.0 (pg)    MCHC 35.6  30.0 - 36.0 (g/dL)    RDW 76.7  34.1 - 93.7 (%)    Platelets 199  150 - 400 (K/uL)   DIFFERENTIAL     Status: Normal   Collection Time   10/24/11  7:45 PM      Component Value  Range Comment   Neutrophils Relative 59  43 - 77 (%)    Neutro Abs 4.9  1.7 - 7.7 (K/uL)    Lymphocytes Relative 33  12 - 46 (%)    Lymphs Abs 2.7  0.7 - 4.0 (K/uL)    Monocytes Relative 4  3 - 12 (%)    Monocytes Absolute 0.4  0.1 - 1.0 (K/uL)    Eosinophils Relative 4  0 - 5 (%)    Eosinophils Absolute 0.3  0.0 - 0.7 (K/uL)    Basophils Relative 0  0 - 1 (%)    Basophils Absolute 0.0  0.0 - 0.1 (K/uL)   COMPREHENSIVE METABOLIC PANEL     Status: Abnormal   Collection Time   10/24/11  7:45 PM      Component Value Range Comment   Sodium 140  135 - 145 (mEq/L)    Potassium 3.4 (*) 3.5 - 5.1 (mEq/L)    Chloride 102  96 - 112 (mEq/L)    CO2 28  19 - 32 (mEq/L)    Glucose, Bld 132 (*) 70 - 99 (mg/dL)  BUN 16  6 - 23 (mg/dL)    Creatinine, Ser 1.47  0.50 - 1.35 (mg/dL)    Calcium 9.0  8.4 - 10.5 (mg/dL)    Total Protein 7.3  6.0 - 8.3 (g/dL)    Albumin 4.0  3.5 - 5.2 (g/dL)    AST 22  0 - 37 (U/L)    ALT 21  0 - 53 (U/L)    Alkaline Phosphatase 64  39 - 117 (U/L)    Total Bilirubin 0.4  0.3 - 1.2 (mg/dL)    GFR calc non Af Amer 56 (*) >90 (mL/min)    GFR calc Af Amer 65 (*) >90 (mL/min)   POCT I-STAT TROPONIN I     Status: Normal   Collection Time   10/24/11  7:58 PM      Component Value Range Comment   Troponin i, poc 0.00  0.00 - 0.08 (ng/mL)    Comment 3            APTT     Status: Normal   Collection Time   10/25/11  1:02 AM      Component Value Range Comment   aPTT 29  24 - 37 (seconds)   PROTIME-INR     Status: Normal   Collection Time   10/25/11  1:02 AM      Component Value Range Comment   Prothrombin Time 14.5  11.6 - 15.2 (seconds)    INR 1.11  0.00 - 1.49     Ct Angio Chest W/cm &/or Wo Cm  10/25/2011  *RADIOLOGY REPORT*  Clinical Data: Shortness of breath and leg swelling.  CT ANGIOGRAPHY CHEST  Technique:  Multidetector CT imaging of the chest using the standard protocol during bolus administration of intravenous contrast. Multiplanar reconstructed images including  MIPs were obtained and reviewed to evaluate the vascular anatomy.  Contrast: OMNIPAQUE IOHEXOL 350 MG/ML SOLN  Comparison: None  Findings: The chest wall is unremarkable.  No supraclavicular or axillary lymphadenopathy.  Small scattered lymph nodes are noted. The bony thorax is intact.  Mild degenerative changes involving the thoracic spine.  No destructive bony lesions or spinal canal compromise.  The heart is normal in size.  No pericardial effusion.  No mediastinal or hilar lymphadenopathy.  Small scattered lymph nodes are noted.  There is mild diffuse fusiform enlargement of the ascending aorta with maximal measurement of 4.4 cm at the level of the right main pulmonary artery.  No dissection.  No significant atherosclerotic calcifications.  The esophagus is grossly normal.  The pulmonary arteries are fairly well opacified.   There are small bilateral pulmonary emboli.  Examination of the lung parenchyma demonstrates mild dependent atelectasis but no infiltrates, edema or effusions.  No worrisome pulmonary mass lesions or nodules.  The upper abdomen is unremarkable.  IMPRESSION:  1.  Small bilateral pulmonary emboli. 2.  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 3.  Dependent atelectasis without infiltrate or effusion.  Original Report Authenticated By: P. Loralie Champagne, M.D.    Review of Systems  Constitutional: Negative.   HENT: Negative.   Respiratory: Positive for shortness of breath.   Cardiovascular: Negative.   Gastrointestinal: Negative.   Genitourinary: Negative.   Musculoskeletal:       LEFT LEG SWELLING.  Skin: Negative.   Neurological: Negative.   Endo/Heme/Allergies: Negative.   Psychiatric/Behavioral: Negative.     Blood pressure 161/112, pulse 67, temperature 98.2 F (36.8 C), temperature source Oral, resp. rate 20, height  5\' 11"  (1.803 m), weight 115.667 kg (255 lb), SpO2 95.00%. Physical Exam  Constitutional: He is oriented  to person, place, and time. He appears well-developed and well-nourished. No distress.  HENT:  Head: Normocephalic and atraumatic.  Right Ear: External ear normal.  Left Ear: External ear normal.  Nose: Nose normal.  Mouth/Throat: Oropharynx is clear and moist. No oropharyngeal exudate.  Eyes: Conjunctivae are normal. Pupils are equal, round, and reactive to light. Right eye exhibits no discharge. Left eye exhibits no discharge. No scleral icterus.  Neck: Normal range of motion. Neck supple.  Cardiovascular: Normal rate and regular rhythm.   Respiratory: Effort normal and breath sounds normal. No respiratory distress. He has no wheezes. He has no rales.  GI: Soft. Bowel sounds are normal. He exhibits no distension. There is no tenderness.  Musculoskeletal: He exhibits edema (Left leg swollen.).  Neurological: He is alert and oriented to person, place, and time.       Moves all extremities.  Skin: He is not diaphoretic.     Assessment/Plan #1. Pulmonary embolism - patient has been started on heparin which we will continue. Add Coumadin per pharmacy. If patient insurance qualifies him for xarelto then may change over to xarelto. Since this is patient's second episode of pulmonary embolism and is being unprovoked patient may need lifelong anticoagulation. Check Doppler of the lower extremity. #2. 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 #3. Hypertension uncontrolled - continue home medications and I have placed patient on when necessary labetolol for systolic blood pressure more than 160. #4. Hyperlipidemia - continue present medications. #5. History of gout - continue present medications.  Patient states he had a normal colonoscopy 7 years ago. This being unprovoked DVT and pulmonary embolism patient may need age-appropriate cancer screening.  CODE STATUS - full code.  Sharese Manrique N. 10/25/2011, 2:55 AM

## 2011-10-25 NOTE — Progress Notes (Signed)
*  PRELIMINARY RESULTS* Vascular Ultrasound Lower extremity venous duplex has been completed.  Preliminary findings: Right= no evidence of DVT. Left= Evidence of DVT involving the peroneal veins.  Farrel Demark RDMS           10/25/2011, 2:27 PM

## 2011-10-26 DIAGNOSIS — I2699 Other pulmonary embolism without acute cor pulmonale: Secondary | ICD-10-CM

## 2011-10-26 DIAGNOSIS — N189 Chronic kidney disease, unspecified: Secondary | ICD-10-CM

## 2011-10-26 DIAGNOSIS — I749 Embolism and thrombosis of unspecified artery: Secondary | ICD-10-CM

## 2011-10-26 DIAGNOSIS — E876 Hypokalemia: Secondary | ICD-10-CM

## 2011-10-26 LAB — PROTIME-INR
INR: 1.14 (ref 0.00–1.49)
Prothrombin Time: 14.8 seconds (ref 11.6–15.2)

## 2011-10-26 LAB — CBC
MCH: 29.4 pg (ref 26.0–34.0)
Platelets: 184 10*3/uL (ref 150–400)
RBC: 4.89 MIL/uL (ref 4.22–5.81)
RDW: 14.2 % (ref 11.5–15.5)
WBC: 6.8 10*3/uL (ref 4.0–10.5)

## 2011-10-26 LAB — BASIC METABOLIC PANEL
CO2: 28 mEq/L (ref 19–32)
Calcium: 8.8 mg/dL (ref 8.4–10.5)
Chloride: 103 mEq/L (ref 96–112)
Creatinine, Ser: 1.38 mg/dL — ABNORMAL HIGH (ref 0.50–1.35)
GFR calc Af Amer: 63 mL/min — ABNORMAL LOW (ref >90)
Sodium: 140 mEq/L (ref 135–145)

## 2011-10-26 MED ORDER — RIVAROXABAN 15 MG PO TABS
15.0000 mg | ORAL_TABLET | Freq: Two times a day (BID) | ORAL | Status: DC
  Administered 2011-10-26 – 2011-10-27 (×3): 15 mg via ORAL
  Filled 2011-10-26 (×4): qty 1

## 2011-10-26 MED ORDER — RIVAROXABAN 10 MG PO TABS: 20.0000 mg | ORAL_TABLET | Freq: Every day | ORAL | Status: DC

## 2011-10-26 MED ORDER — RIVAROXABAN 15 MG PO TABS
15.0000 mg | ORAL_TABLET | Freq: Once | ORAL | Status: AC
  Administered 2011-10-26: 15 mg via ORAL
  Filled 2011-10-26: qty 1

## 2011-10-26 NOTE — Progress Notes (Signed)
ANTICOAGULATION CONSULT NOTE - Follow Up Consult  Pharmacy Consult for Heparin Indication: PE  Allergies  Allergen Reactions  . Penicillins Shortness Of Breath  . Amlodipine Swelling    Patient Measurements: Height: 5\' 10"  (177.8 cm) Weight: 256 lb 2.8 oz (116.2 kg) IBW/kg (Calculated) : 73  Heparin Dosing Weight: 98.7kg  Vital Signs: Temp: 97.5 F (36.4 C) (06/05 0443) Temp src: Oral (06/05 0443) BP: 121/72 mmHg (06/05 0443) Pulse Rate: 54  (06/05 0443)  Labs:  Basename 10/26/11 0513 10/25/11 1729 10/25/11 0916 10/25/11 0630 10/25/11 0102 10/24/11 1945  HGB 14.4 -- -- 14.7 -- --  HCT 41.7 -- -- 43.1 -- 43.5  PLT 184 -- -- 206 -- 199  APTT -- -- -- -- 29 --  LABPROT 14.8 -- -- -- 14.5 --  INR 1.14 -- -- -- 1.11 --  HEPARINUNFRC 0.90* 0.57 0.72* -- -- --  CREATININE 1.38* -- -- 1.21 -- 1.34  CKTOTAL -- -- -- -- -- --  CKMB -- -- -- -- -- --  TROPONINI -- -- -- -- -- --    Estimated Creatinine Clearance: 73.6 ml/min (by C-G formula based on Cr of 1.38).  Assessment: 61 yo male with PE for Heparin  Goal of Therapy:  INR 2-3 Heparin level 0.3-0.7 units/ml Monitor platelets by anticoagulation protocol: Yes   Plan:  Decrease Heparin 1300 units/hr F/U plan for Xarelto  Eddie Candle 10/26/2011,6:55 AM

## 2011-10-26 NOTE — Progress Notes (Signed)
ANTICOAGULATION CONSULT NOTE - Follow Up Consult  Pharmacy Consult for Heparin/Coumadin --> Xarelto Indication: PE  Allergies  Allergen Reactions  . Penicillins Shortness Of Breath  . Amlodipine Swelling    Patient Measurements: Height: 5\' 10"  (177.8 cm) Weight: 256 lb 2.8 oz (116.2 kg) IBW/kg (Calculated) : 73  Heparin Dosing Weight:   Vital Signs: Temp: 97.5 F (36.4 C) (06/05 0443) Temp src: Oral (06/05 0443) BP: 121/72 mmHg (06/05 0443) Pulse Rate: 54  (06/05 0443)  Labs:  Basename 10/26/11 0513 10/25/11 1729 10/25/11 0916 10/25/11 0630 10/25/11 0102 10/24/11 1945  HGB 14.4 -- -- 14.7 -- --  HCT 41.7 -- -- 43.1 -- 43.5  PLT 184 -- -- 206 -- 199  APTT -- -- -- -- 29 --  LABPROT 14.8 -- -- -- 14.5 --  INR 1.14 -- -- -- 1.11 --  HEPARINUNFRC 0.90* 0.57 0.72* -- -- --  CREATININE 1.38* -- -- 1.21 -- 1.34  CKTOTAL -- -- -- -- -- --  CKMB -- -- -- -- -- --  TROPONINI -- -- -- -- -- --    Estimated Creatinine Clearance: 73.6 ml/min (by C-G formula based on Cr of 1.38).   Medications:  Heparin 1300 units/hr  Assessment: 61yom on Heparin bridging to Coumadin for CTA confirmed PE. Pharmacy now consulted to transition patient to Xarelto for PE treatment. Confirmed plan with Dr. Waymon Amato - patient agrees to oupatient Xarelto cost. First dose of Xarelto should be given at the time of Heparin drip discontinuation and when the INR is < 3.  - INR 1.14 - H/H and Plts wnl - AST/ALT wnl - CrCl 74 ml/min - No significant bleeding reported   Plan:  1. Discontinue Heparin drip and Coumadin now 2. Xarelto 15mg  BID with food x 3 weeks, then 20mg  once daily with food - first dose given at time of Heparin drip discontinuation 3. Monitor CBC and s/s bleeding  Cleon Dew [phone] 10/26/2011,1:00 PM

## 2011-10-26 NOTE — Plan of Care (Signed)
Problem: Phase I Progression Outcomes Goal: Discharge plan established Outcome: Completed/Met Date Met:  10/26/11 Home with daughters help

## 2011-10-26 NOTE — Progress Notes (Signed)
Subjective:   Left leg pain has improved. Denies pain while lying in bed but still complains of sharp pains on weightbearing. Has ambulated to the bathroom x1. No bleeding. Occasional dyspnea on exertion.  Objective  Vital signs in last 24 hours: Filed Vitals:   10/25/11 0343 10/25/11 1407 10/25/11 2018 10/26/11 0443  BP: 160/90 152/93 130/97 121/72  Pulse: 95 63 64 54  Temp: 98.5 F (36.9 C) 98.2 F (36.8 C) 98 F (36.7 C) 97.5 F (36.4 C)  TempSrc: Oral Oral Oral Oral  Resp: 18 18 18 17   Height: 5\' 10"  (1.778 m)     Weight: 116.2 kg (256 lb 2.8 oz)     SpO2: 95% 95% 98% 96%   Weight change:   Intake/Output Summary (Last 24 hours) at 10/26/11 1157 Last data filed at 10/25/11 1732  Gross per 24 hour  Intake 829.17 ml  Output    725 ml  Net 104.17 ml    Physical Exam:  General Exam: Comfortable.  Respiratory System: Clear. No increased work of breathing.  Cardiovascular System: First and second heart sounds heard. Regular rate and rhythm. No JVD/murmurs. Telemetry shows sinus bradycardia in the 50s to sinus rhythm without any arrhythmia alarms. Gastrointestinal System: Abdomen is non distended, soft and normal bowel sounds heard. Nontender. Central Nervous System: Alert and oriented. No focal neurological deficits. Extremities: Left leg (below-knee) mildly asymmetrically swollen and warm compared to the right (swelling is decreasing and calf seems to be less tight compared to 6/4). No redness or color change. Peripheral pulses symmetrically well felt.  Labs:  Basic Metabolic Panel:  Lab 10/26/11 0981 10/25/11 0630 10/24/11 1945  NA 140 141 140  K 3.6 3.1* 3.4*  CL 103 102 102  CO2 28 25 28   GLUCOSE 117* 122* 132*  BUN 16 14 16   CREATININE 1.38* 1.21 1.34  CALCIUM 8.8 8.7 9.0  ALB -- -- --  PHOS -- -- --   Liver Function Tests:  Lab 10/25/11 0630 10/24/11 1945  AST 17 22  ALT 18 21  ALKPHOS 60 64  BILITOT 0.6 0.4  PROT 6.9 7.3  ALBUMIN 3.7 4.0   No  results found for this basename: LIPASE:3,AMYLASE:3 in the last 168 hours No results found for this basename: AMMONIA:3 in the last 168 hours CBC:  Lab 10/26/11 0513 10/25/11 0630 10/24/11 1945  WBC 6.8 9.5 8.3  NEUTROABS -- -- 4.9  HGB 14.4 14.7 15.5  HCT 41.7 43.1 43.5  MCV 85.3 85.0 83.7  PLT 184 206 199   Cardiac Enzymes: No results found for this basename: CKTOTAL:5,CKMB:5,CKMBINDEX:5,TROPONINI:5 in the last 168 hours CBG: No results found for this basename: GLUCAP:5 in the last 168 hours  Iron Studies: No results found for this basename: IRON,TIBC,TRANSFERRIN,FERRITIN in the last 72 hours Studies/Results: Ct Angio Chest W/cm &/or Wo Cm  10/25/2011  *RADIOLOGY REPORT*  Clinical Data: Shortness of breath and leg swelling.  CT ANGIOGRAPHY CHEST  Technique:  Multidetector CT imaging of the chest using the standard protocol during bolus administration of intravenous contrast. Multiplanar reconstructed images including MIPs were obtained and reviewed to evaluate the vascular anatomy.  Contrast: OMNIPAQUE IOHEXOL 350 MG/ML SOLN  Comparison: None  Findings: The chest wall is unremarkable.  No supraclavicular or axillary lymphadenopathy.  Small scattered lymph nodes are noted. The bony thorax is intact.  Mild degenerative changes involving the thoracic spine.  No destructive bony lesions or spinal canal compromise.  The heart is normal in size.  No pericardial  effusion.  No mediastinal or hilar lymphadenopathy.  Small scattered lymph nodes are noted.  There is mild diffuse fusiform enlargement of the ascending aorta with maximal measurement of 4.4 cm at the level of the right main pulmonary artery.  No dissection.  No significant atherosclerotic calcifications.  The esophagus is grossly normal.  The pulmonary arteries are fairly well opacified.   There are small bilateral pulmonary emboli.  Examination of the lung parenchyma demonstrates mild dependent atelectasis but no infiltrates, edema or  effusions.  No worrisome pulmonary mass lesions or nodules.  The upper abdomen is unremarkable.  IMPRESSION:  1.  Small bilateral pulmonary emboli. 2.  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 3.  Dependent atelectasis without infiltrate or effusion.  Original Report Authenticated By: P. Loralie Champagne, M.D.   Medications:    . heparin 1,300 Units/hr (10/26/11 0927)      . sodium chloride   Intravenous STAT  . allopurinol  300 mg Oral Daily  . atorvastatin  40 mg Oral q1800  . finasteride  5 mg Oral Daily  . gabapentin  300 mg Oral BID  . hydrochlorothiazide  25 mg Oral Daily  . loratadine  10 mg Oral Daily  . losartan  100 mg Oral Daily  . niacin  500 mg Oral Q breakfast  . propranolol  60 mg Oral BID  . sodium chloride  3 mL Intravenous Q12H  . warfarin  10 mg Oral ONCE-1800  . Warfarin - Pharmacist Dosing Inpatient   Does not apply q1800    I  have reviewed scheduled and prn medications.     Problem/Plan: Principal Problem:  *Pulmonary embolism Active Problems:  HTN (hypertension)  Hyperlipidemia  Gout  1. Acute bilateral pulmonary embolism, secondary to left lower extremity DVT: History of pulmonary embolism in 2006. Currently on IV heparin and Coumadin per pharmacy. Will switch to Xarelto. Recommend outpatient hypercoagulable workup in a couple of months and hematology consultation. He may need prolonged/lifelong anticoagulation. 2. Left Peroneal veins DVT: Management as above. 3. Hypokalemia: Likely secondary to diuretics. Repleted. 4. Possible chronic kidney disease: Stable. Outpatient followup. 5. Hypertension: Continue propranolol, losartan and HCTZ. Monitor. Reasonably controlled. 6. History of gout: Continue allopurinol. 7. History of OSA: On CPAP. 8. History of hyperlipidemia: Continue statins and niacin. 9. 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 Outpatient followup.  Disposition:  Ambulate patient and possible discharge 10/27/11.  Rhen Kawecki 10/26/2011,11:57 AM  LOS: 2 days

## 2011-10-27 DIAGNOSIS — N189 Chronic kidney disease, unspecified: Secondary | ICD-10-CM

## 2011-10-27 DIAGNOSIS — I82409 Acute embolism and thrombosis of unspecified deep veins of unspecified lower extremity: Secondary | ICD-10-CM

## 2011-10-27 DIAGNOSIS — I749 Embolism and thrombosis of unspecified artery: Secondary | ICD-10-CM

## 2011-10-27 DIAGNOSIS — G4733 Obstructive sleep apnea (adult) (pediatric): Secondary | ICD-10-CM | POA: Diagnosis present

## 2011-10-27 DIAGNOSIS — E876 Hypokalemia: Secondary | ICD-10-CM

## 2011-10-27 DIAGNOSIS — Z9989 Dependence on other enabling machines and devices: Secondary | ICD-10-CM | POA: Diagnosis present

## 2011-10-27 DIAGNOSIS — I2699 Other pulmonary embolism without acute cor pulmonale: Secondary | ICD-10-CM

## 2011-10-27 HISTORY — DX: Acute embolism and thrombosis of unspecified deep veins of unspecified lower extremity: I82.409

## 2011-10-27 LAB — CBC
MCV: 84.4 fL (ref 78.0–100.0)
Platelets: 196 10*3/uL (ref 150–400)
RBC: 5.14 MIL/uL (ref 4.22–5.81)
RDW: 14 % (ref 11.5–15.5)
WBC: 7.2 10*3/uL (ref 4.0–10.5)

## 2011-10-27 MED ORDER — RIVAROXABAN 20 MG PO TABS: 20.0000 mg | ORAL_TABLET | Freq: Every day | ORAL | Status: DC

## 2011-10-27 MED ORDER — RIVAROXABAN 15 MG PO TABS: 15.0000 mg | ORAL_TABLET | Freq: Two times a day (BID) | ORAL | Status: DC
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
## 2011-10-27 NOTE — Discharge Instructions (Signed)
Coronary Artery Disease, Risk Factors Research has shown that the risk of developing coronary artery disease (CAD) and having a heart attack increases with each factor you have. RISK FACTORS YOU CANNOT CHANGE  Your age. Your risk goes up as you get older. Most heart attacks happen to people over the age of 65.   Gender. Men have a greater risk of heart attack than women, and they have attacks earlier in life. However, women are more likely to die from a heart attack.   Heredity. Children of parents with heart disease are more likely to develop it themselves.   Race. African Americans and other ethnic groups have a higher risk, possibly because of high blood pressure, a tendency toward obesity, and diabetes.   Your family. Most people with a strong family history of heart disease have one or more other risk factors.  RISK FACTORS YOU CAN CHANGE  Exposure to tobacco smoke. Even secondhand smoke greatly increases the risk for heart disease.   High blood cholesterol may be lowered with changes in diet, activity, and medicines.   High blood pressure makes the heart work harder. This causes the heart muscles to become thick and, eventually, weaker. It also increases your risk of stroke, heart attack, and kidney or heart failure.   Physical inactivity is a risk factor for CAD. Regular physical activity helps prevent heart and blood vessel disease. Exercise helps control blood cholesterol, diabetes, obesity, and it may help lower blood pressure in some people.   Excess body fat, especially belly fat, increases the risk of heart disease and stroke even if there are no other risk factors. Excess weight increases the heart's workload and raises blood pressure and blood cholesterol.   Diabetes seriously increases your risk of developing CAD. If you have diabetes, you should work with your caregiver to manage it and control other risk factors.  OTHER RISK FACTORS FOR CAD  How you respond to stress.     Drinking too much alcohol may raise blood pressure, cause heart failure, and lead to stroke.   Total cholesterol greater than 200 milligrams.   HDL (good) cholesterol less than 40 milligrams. HDL helps keep cholesterol from building up in the walls of the arteries.  PREVENTING CAD  Maintain a healthy weight.   Exercise or do physical activity.   Eat a heart-healthy diet low in fat and salt and high in fiber.   Control your blood pressure to keep it below 120 over 80.   Keep your cholesterol at a level that lowers your risk.   Manage diabetes if you have it.   Stop smoking.   Learn how to manage stress.  HEART SMART SUBSTITUTIONS  Instead of whole or 2% milk and cream, use skim milk.   Instead of fried foods, eat baked, steamed, boiled, broiled, or microwaved foods.   Instead of lard, butter, palm and coconut oils, cook with unsaturated vegetable oils, such as corn, olive, canola, safflower, sesame, soybean, sunflower, or peanut.   Instead of fatty cuts of meat, eat lean cuts of meat or cut off the fatty parts.   Instead of 1 whole egg in recipes, use 2 egg whites.   Instead of sauces, butter, and salt, season vegetables with herbs and spices.   Instead of regular hard and processed cheeses, eat low-fat, low-sodium cheeses.   Instead of salted potato chips, choose low-fat, unsalted tortilla and potato chips and unsalted pretzels and popcorn.   Instead of sour cream and mayonnaise, use plain   low-fat yogurt, low-fat cottage cheese, or low-fat or "light" sour cream.  FOR MORE INFORMATION  National Heart Lung and Blood Institute: www.nhlbi.nih.gov/health/hearttruth American Heart Association: www.heart.org/HEARTORG Document Released: 07/30/2003 Document Revised: 04/28/2011 Document Reviewed: 07/25/2007 Exit[employer] Patient Information 2012 [employer]. 

## 2011-10-27 NOTE — Progress Notes (Signed)
10/26/2011 23:55 Patient placed on CPAP 5 with 21% Fio2. He has a nasal mask on and tolerates very well at this time. All vital signs are stable. RT will continue to monitor. Thelma Barge Florabelle Cardin RRT,RCP

## 2011-11-10 ENCOUNTER — Ambulatory Visit: Payer: Self-pay | Admitting: Internal Medicine

## 2011-11-17 ENCOUNTER — Observation Stay: Payer: Self-pay | Admitting: Internal Medicine

## 2011-11-17 LAB — BASIC METABOLIC PANEL
Anion Gap: 5 — ABNORMAL LOW (ref 7–16)
BUN: 14 mg/dL (ref 7–18)
Calcium, Total: 8.6 mg/dL (ref 8.5–10.1)
Chloride: 105 mmol/L (ref 98–107)
Co2: 32 mmol/L (ref 21–32)
Creatinine: 1.46 mg/dL — ABNORMAL HIGH (ref 0.60–1.30)
EGFR (African American): >60
EGFR (Non-African Amer.): 52 — ABNORMAL LOW
Glucose: 133 mg/dL — ABNORMAL HIGH (ref 65–99)
Osmolality: 286 (ref 275–301)
Potassium: 3.7 mmol/L (ref 3.5–5.1)
Sodium: 142 mmol/L (ref 136–145)

## 2011-11-17 LAB — APTT: Activated PTT: 39.4 secs — ABNORMAL HIGH (ref 23.6–35.9)

## 2011-11-17 LAB — CBC
HGB: 15 g/dL (ref 13.0–18.0)
MCH: 29.6 pg (ref 26.0–34.0)
MCHC: 33.7 g/dL (ref 32.0–36.0)
MCV: 88 fL (ref 80–100)
RBC: 5.08 10*6/uL (ref 4.40–5.90)
WBC: 5.6 10*3/uL (ref 3.8–10.6)

## 2011-11-17 LAB — HEMOGLOBIN: HGB: 14.8 g/dL (ref 13.0–18.0)

## 2011-11-17 LAB — HEPATIC FUNCTION PANEL A (ARMC)
Albumin: 4 g/dL (ref 3.4–5.0)
Bilirubin, Direct: <0.1 mg/dL (ref 0.00–0.20)
SGPT (ALT): 32 U/L
Total Protein: 7.2 g/dL (ref 6.4–8.2)

## 2011-11-17 LAB — TROPONIN I
Troponin-I: <0.02 ng/mL
Troponin-I: <0.02 ng/mL

## 2011-11-17 LAB — PROTIME-INR
INR: 1.8
Prothrombin Time: 21.1 secs — ABNORMAL HIGH (ref 11.5–14.7)

## 2011-11-17 LAB — CK TOTAL AND CKMB (NOT AT ARMC)
CK, Total: 363 U/L — ABNORMAL HIGH (ref 35–232)
CK-MB: 1 ng/mL (ref 0.5–3.6)
CK-MB: 0.6 ng/mL (ref 0.5–3.6)

## 2011-11-18 LAB — BASIC METABOLIC PANEL
Anion Gap: 7 (ref 7–16)
BUN: 14 mg/dL (ref 7–18)
Chloride: 106 mmol/L (ref 98–107)
Creatinine: 1.67 mg/dL — ABNORMAL HIGH (ref 0.60–1.30)
EGFR (African American): 51 — ABNORMAL LOW
EGFR (Non-African Amer.): 44 — ABNORMAL LOW

## 2011-11-18 LAB — CBC WITH DIFFERENTIAL/PLATELET
Basophil #: 0 10*3/uL (ref 0.0–0.1)
Basophil %: 0.4 %
HCT: 43.1 % (ref 40.0–52.0)
Lymphocyte #: 2.7 10*3/uL (ref 1.0–3.6)
Lymphocyte %: 42.5 %
MCHC: 33 g/dL (ref 32.0–36.0)
MCV: 88 fL (ref 80–100)
Neutrophil #: 2.9 10*3/uL (ref 1.4–6.5)
Neutrophil %: 45.4 %
Platelet: 180 10*3/uL (ref 150–440)

## 2011-11-18 LAB — CK TOTAL AND CKMB (NOT AT ARMC): CK-MB: 0.9 ng/mL (ref 0.5–3.6)

## 2011-11-18 LAB — LIPID PANEL: HDL Cholesterol: 23 mg/dL — ABNORMAL LOW (ref 40–60)

## 2011-12-01 ENCOUNTER — Ambulatory Visit: Payer: Self-pay | Admitting: Internal Medicine

## 2011-12-16 ENCOUNTER — Telehealth: Payer: Self-pay | Admitting: Internal Medicine

## 2011-12-16 NOTE — Telephone Encounter (Signed)
S/w pt re appt for 8/6 @ 9:30 am. Date per pt - cannot come 7/31.

## 2011-12-27 ENCOUNTER — Telehealth: Payer: Self-pay | Admitting: *Deleted

## 2011-12-27 ENCOUNTER — Other Ambulatory Visit: Payer: BC Managed Care – PPO | Admitting: Lab

## 2011-12-27 ENCOUNTER — Ambulatory Visit (HOSPITAL_BASED_OUTPATIENT_CLINIC_OR_DEPARTMENT_OTHER): Payer: BC Managed Care – PPO | Admitting: Internal Medicine

## 2011-12-27 ENCOUNTER — Ambulatory Visit: Payer: BC Managed Care – PPO

## 2011-12-27 ENCOUNTER — Encounter: Payer: Self-pay | Admitting: Internal Medicine

## 2011-12-27 ENCOUNTER — Ambulatory Visit (HOSPITAL_BASED_OUTPATIENT_CLINIC_OR_DEPARTMENT_OTHER): Payer: BC Managed Care – PPO | Admitting: Lab

## 2011-12-27 VITALS — BP 138/90 | HR 68 | Temp 98.6°F | Resp 22 | Ht 71.0 in | Wt 260.7 lb

## 2011-12-27 DIAGNOSIS — I824Z9 Acute embolism and thrombosis of unspecified deep veins of unspecified distal lower extremity: Secondary | ICD-10-CM

## 2011-12-27 DIAGNOSIS — I2699 Other pulmonary embolism without acute cor pulmonale: Secondary | ICD-10-CM

## 2011-12-27 NOTE — Telephone Encounter (Signed)
Gave patient appointment for 6 momths placed the patient on the lab schedule for 12-27-2011

## 2011-12-27 NOTE — Progress Notes (Signed)
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  HPI [name] Whitaker is a 61 y.o. male was past medical history significant for hypertension, benign prostatic hypertrophy, dyslipidemia, migraine headache and history of left deep venous thrombosis and pulmonary emboli in 2006 after left foot injury. He was treated with Coumadin for 6 months at that time. The patient has been doing fine until early June of 2013 when he noted sudden pain and swelling in his left lower extremity and shortness of breath with exertion. He was seen at the emergency Department at [facility]. CT angiogram of the chest at that time showed small bilateral pulmonary emboli. Lower extremity venous Doppler was performed on 10/25/2011 and it showed no evidence of deep venous thrombosis involving the right lower extremity but there was none compressible peroneal veins consistent with deep venous thrombosis. The patient was treated with IV heparin then switched Xarelto on 10/26/2011. He is currently on Xarelto 20 by mouth daily and tolerating his treatment fairly well with no significant adverse effects. He has occasional tightness in his chest as well as shortness breath. No cough or hemoptysis. He has no bleeding issues. He was referred to me today for evaluation and recommendation regarding his recurrent pulmonary emboli. The patient did not have any hypercoagulable studies performed before. His brother also has a history of deep venous thrombosis. The patient is single and has 3 children. 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 He has a history of smoking pipes but not recently. No history of alcohol or drug abuse. @SFHPI @  Past Medical History  Diagnosis Date  . Gout   . Hypercholesteremia   . Pulmonary embolism   . Hypertension   . Migraine      Past Surgical History  Procedure Date  . Appendectomy   . Nasal septum surgery   . Tonsillectomy   . Testicle surgery   . Cardiac catheterization     Family History  Problem Relation Age of Onset  . Hypertension Mother   . Hypertension Father     Social History History  Substance Use Topics  . Smoking status: Current Everyday Smoker    Types: Pipe  . Smokeless tobacco: Not on file  . Alcohol Use: No    Allergies  Allergen Reactions  . Penicillins Shortness Of Breath  . Amlodipine Swelling    Current Outpatient Prescriptions  Medication Sig Dispense Refill  . allopurinol (ZYLOPRIM) 300 MG tablet Take 300 mg by mouth daily.      . finasteride (PROSCAR) 5 MG tablet Take 5 mg by mouth daily.      Marland Kitchen gabapentin (NEURONTIN) 300 MG capsule Take 300 mg by mouth 2 (two) times daily.      Marland Kitchen loratadine (CLARITIN) 10 MG tablet Take 10 mg by mouth daily.      Marland Kitchen losartan-hydrochlorothiazide (HYZAAR) 100-25 MG per tablet Take 1 tablet by mouth daily.      . niacin 500 MG tablet Take 500 mg by mouth daily with breakfast.      . propranolol (INDERAL) 60 MG tablet Take 60 mg by mouth 2 (two) times daily.      . rivaroxaban (XARELTO) 10 MG TABS tablet Take 20 mg by mouth daily.      . rivaroxaban 20 MG TABS Take 20 mg by mouth daily with supper. Start after completing  15 mg twice daily prescription.  30 tablet  0  . simvastatin (ZOCOR) 80 MG tablet Take 80 mg by mouth at bedtime.      Marland Kitchen DISCONTD: Rivaroxaban (XARELTO) 15 MG TABS tablet Take 1 tablet (15 mg total) by mouth 2 (two) times daily with a meal. Start 10/27/11 pm. After completing this dose,start 20 mg daily.  39 tablet  0    Review of Systems  A comprehensive review of systems was negative.  Physical Exam  WUJ:WJXBJ, healthy, no distress, well nourished and well developed SKIN: skin color, texture, turgor are normal HEAD: Normocephalic EYES: normal EARS: External ears normal OROPHARYNX:no exudate and no erythema    NECK: supple, no adenopathy LYMPH:  no palpable lymphadenopathy, no hepatosplenomegaly LUNGS: clear to auscultation  HEART: regular rate & rhythm, no murmurs and no gallops ABDOMEN:abdomen soft, non-tender, normal bowel sounds and no masses or organomegaly BACK: Back symmetric, no curvature. EXTREMITIES:no joint deformities, effusion, or inflammation, no edema, no skin discoloration, no clubbing, no cyanosis  NEURO: alert & oriented x 3 with fluent speech, no focal motor/sensory deficits  PERFORMANCE STATUS: ECOG 0  LABORATORY DATA: Lab Results  Component Value Date   WBC 7.2 10/27/2011   HGB 15.5 10/27/2011   HCT 43.4 10/27/2011   MCV 84.4 10/27/2011   PLT 196 10/27/2011      Chemistry      Component Value Date/Time   NA 140 10/26/2011 0513   K 3.6 10/26/2011 0513   CL 103 10/26/2011 0513   CO2 28 10/26/2011 0513   BUN 16 10/26/2011 0513   CREATININE 1.38* 10/26/2011 0513      Component Value Date/Time   CALCIUM 8.8 10/26/2011 0513   ALKPHOS 60 10/25/2011 0630   AST 17 10/25/2011 0630   ALT 18 10/25/2011 0630   BILITOT 0.6 10/25/2011 0630       RADIOGRAPHIC STUDIES: No results found.  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 His first deep venous thrombosis and pulmonary emboli was proceeded by left foot injury but the second one was incidental with no prior trauma or travel. The patient has a sedentary job which may have been a contributing factor.  PLAN: I have a lengthy discussion with the patient today about his condition. I recommended for him to proceed with a hypercoagulable panel today and also another one after completion of his anticoagulation therapy in 6 months. If there is no significant genetic or acquired abnormalities in his hypercoagulable panel, he should be treated with anticoagulation only for 6 months. If the patient has any significant genetic or acquired  abnormalities in his hypercoagulable panel, I would recommend for him anticoagulation for life. The patient would come back for followup visit in 6 months for reevaluation. He was advised to call me immediately she has any concerning symptoms in the interval. His treatment with Xarelto would be monitored by his primary care physician during this period.   All questions were answered. The patient knows to call the clinic with any problems, questions or concerns. We can certainly see the patient much sooner if necessary.  Thank you so much for allowing me to participate in the care of [name] Whitaker. I will continue to follow up the patient with you and assist in his care.  I spent 30 minutes counseling the patient face to face. The total time spent in the appointment was 55 minutes.  Aeden Matranga K. 12/27/2011, 11:17 AM

## 2011-12-30 LAB — HYPERCOAGULABLE PANEL, COMPREHENSIVE
Anticardiolipin IgA: 2 APL U/mL (ref <22)
Anticardiolipin IgG: 11 GPL U/mL (ref <23)
Anticardiolipin IgM: 1 MPL U/mL (ref <11)
DRVVT 1:1 Mix: 44.6 secs (ref <45.1)
DRVVT: 66.6 secs — ABNORMAL HIGH (ref <45.1)
PTT Lupus Anticoagulant: 41.8 secs (ref 28.0–43.0)
Protein C Activity: 148 % — ABNORMAL HIGH (ref 75–133)
Protein S Activity: 84 % (ref 69–129)
Protein S Total: 75 % (ref 60–150)

## 2012-04-03 ENCOUNTER — Emergency Department: Payer: Self-pay | Admitting: Emergency Medicine

## 2012-04-03 LAB — CBC
HCT: 46 % (ref 40.0–52.0)
HGB: 15.7 g/dL (ref 13.0–18.0)
MCH: 29.8 pg (ref 26.0–34.0)
MCHC: 34.2 g/dL (ref 32.0–36.0)
MCV: 87 fL (ref 80–100)
Platelet: 227 10*3/uL (ref 150–440)
RBC: 5.28 10*6/uL (ref 4.40–5.90)
RDW: 14.6 % — ABNORMAL HIGH (ref 11.5–14.5)
WBC: 6.3 10*3/uL (ref 3.8–10.6)

## 2012-04-03 LAB — COMPREHENSIVE METABOLIC PANEL
Albumin: 3.9 g/dL (ref 3.4–5.0)
Alkaline Phosphatase: 75 U/L (ref 50–136)
Anion Gap: 7 (ref 7–16)
BUN: 13 mg/dL (ref 7–18)
Bilirubin,Total: 0.7 mg/dL (ref 0.2–1.0)
Calcium, Total: 9.1 mg/dL (ref 8.5–10.1)
Chloride: 106 mmol/L (ref 98–107)
Co2: 28 mmol/L (ref 21–32)
Creatinine: 1.49 mg/dL — ABNORMAL HIGH (ref 0.60–1.30)
EGFR (African American): 59 — ABNORMAL LOW
EGFR (Non-African Amer.): 51 — ABNORMAL LOW
Glucose: 114 mg/dL — ABNORMAL HIGH (ref 65–99)
Osmolality: 282 (ref 275–301)
Potassium: 3.4 mmol/L — ABNORMAL LOW (ref 3.5–5.1)
SGOT(AST): 23 U/L (ref 15–37)
SGPT (ALT): 30 U/L (ref 12–78)
Sodium: 141 mmol/L (ref 136–145)
Total Protein: 7.8 g/dL (ref 6.4–8.2)

## 2012-06-07 ENCOUNTER — Other Ambulatory Visit: Payer: Self-pay | Admitting: Internal Medicine

## 2012-06-07 ENCOUNTER — Other Ambulatory Visit (HOSPITAL_BASED_OUTPATIENT_CLINIC_OR_DEPARTMENT_OTHER): Payer: BC Managed Care – PPO | Admitting: Lab

## 2012-06-07 DIAGNOSIS — I2699 Other pulmonary embolism without acute cor pulmonale: Secondary | ICD-10-CM

## 2012-06-08 LAB — HYPERCOAGULABLE PANEL, COMPREHENSIVE
Anticardiolipin IgA: 2 APL U/mL (ref <22)
Anticardiolipin IgG: 8 GPL U/mL (ref <23)
Beta-2-Glycoprotein I IgA: 5 A Units (ref <20)
DRVVT: 67.2 secs — ABNORMAL HIGH (ref <42.9)
Drvvt confirmation: 1.1 Ratio (ref <1.11)
Lupus Anticoagulant: NOT DETECTED
Protein C Activity: 173 % — ABNORMAL HIGH (ref 75–133)
Protein C, Total: 83 % (ref 72–160)
Protein S Total: 71 % (ref 60–150)

## 2012-06-21 ENCOUNTER — Ambulatory Visit (HOSPITAL_BASED_OUTPATIENT_CLINIC_OR_DEPARTMENT_OTHER): Payer: BC Managed Care – PPO | Admitting: Internal Medicine

## 2012-06-21 ENCOUNTER — Encounter: Payer: Self-pay | Admitting: Internal Medicine

## 2012-06-21 VITALS — BP 132/79 | HR 84 | Temp 97.8°F | Resp 22 | Ht 71.0 in | Wt 265.7 lb

## 2012-06-21 DIAGNOSIS — Z7901 Long term (current) use of anticoagulants: Secondary | ICD-10-CM

## 2012-06-21 DIAGNOSIS — I2699 Other pulmonary embolism without acute cor pulmonale: Secondary | ICD-10-CM

## 2012-06-21 DIAGNOSIS — I82409 Acute embolism and thrombosis of unspecified deep veins of unspecified lower extremity: Secondary | ICD-10-CM

## 2012-06-21 NOTE — Progress Notes (Signed)
[facility] Telephone:([phone]) [phone]   Fax:([phone]  OFFICE PROGRESS NOTE  MASOUD,JAVED, MD [address] [place] [address]  DIAGNOSIS: Recurrent pulmonary emboli and deep venous thrombosis.  CURRENT THERAPY: Xarelto 20 mg by mouth daily  INTERVAL HISTORY: [name] Whitaker 62 y.o. male returns to the clinic today for a routine followup visit. The patient is feeling fine today with no specific complaints he denied having any significant chest pain, shortness breath, cough or hemoptysis. He denied having any bleeding issues, bruises or ecchymosis. The patient denied having any significant weight loss or night sweats. He is tolerating his treatment with Xarelto fairly well with no significant adverse effects. He had a hypercoagulable panel performed twice 6 months ago and recently which showed no significant genetic or acquired abnormality of anticoagulation. The patient is here today for evaluation and recommendation regarding his condition.  MEDICAL HISTORY: Past Medical History  Diagnosis Date  . Gout   . Hypercholesteremia   . Pulmonary embolism   . Hypertension   . Migraine     ALLERGIES:  is allergic to penicillins and amlodipine.  MEDICATIONS:  Current Outpatient Prescriptions  Medication Sig Dispense Refill  . allopurinol (ZYLOPRIM) 300 MG tablet Take 300 mg by mouth daily.      . finasteride (PROSCAR) 5 MG tablet Take 5 mg by mouth daily.      Marland Kitchen gabapentin (NEURONTIN) 300 MG capsule Take 300 mg by mouth 2 (two) times daily.      . hydrochlorothiazide (HYDRODIURIL) 50 MG tablet 50 mg Daily.      Marland Kitchen losartan-hydrochlorothiazide (HYZAAR) 100-25 MG per tablet Take 1 tablet by mouth daily.      . niacin 500 MG tablet Take 500 mg by mouth daily with breakfast.      . propranolol (INDERAL) 60 MG tablet Take 60 mg by mouth 2 (two) times daily.      . rivaroxaban (XARELTO) 10 MG TABS tablet Take 20 mg by mouth daily.      . simvastatin  (ZOCOR) 80 MG tablet Take 80 mg by mouth at bedtime.      Marland Kitchen loratadine (CLARITIN) 10 MG tablet Take 10 mg by mouth daily.        SURGICAL HISTORY:  Past Surgical History  Procedure Date  . Appendectomy   . Nasal septum surgery   . Tonsillectomy   . Testicle surgery   . Cardiac catheterization     REVIEW OF SYSTEMS:  A comprehensive review of systems was negative.   PHYSICAL EXAMINATION: General appearance: alert, cooperative and no distress Head: Normocephalic, without obvious abnormality, atraumatic Neck: no adenopathy Resp: clear to auscultation bilaterally Cardio: regular rate and rhythm, S1, S2 normal, no murmur, click, rub or gallop GI: soft, non-tender; bowel sounds normal; no masses,  no organomegaly Extremities: extremities normal, atraumatic, no cyanosis or edema  ECOG PERFORMANCE STATUS: 0 - Asymptomatic  Blood pressure 132/79, pulse 84, temperature 97.8 F (36.6 C), temperature source Oral, resp. rate 22, height 5\' 11"  (1.803 m), weight 265 lb 11.2 oz (120.521 kg).  LABORATORY DATA: Lab Results  Component Value Date   WBC 7.2 10/27/2011   HGB 15.5 10/27/2011   HCT 43.4 10/27/2011   MCV 84.4 10/27/2011   PLT 196 10/27/2011      Chemistry      Component Value Date/Time   NA 140 10/26/2011 0513   K 3.6 10/26/2011 0513   CL 103 10/26/2011 0513   CO2 28  10/26/2011 0513   BUN 16 10/26/2011 0513   CREATININE 1.38* 10/26/2011 0513      Component Value Date/Time   CALCIUM 8.8 10/26/2011 0513   ALKPHOS 60 10/25/2011 0630   AST 17 10/25/2011 0630   ALT 18 10/25/2011 0630   BILITOT 0.6 10/25/2011 0630       RADIOGRAPHIC STUDIES: No results found.  ASSESSMENT: This is a very pleasant 62 years old [place] American male with recurrent pulmonary emboli and deep venous thrombosis but no significant acquired or genetic hypercoagulable abnormality on the recent blood work. The patient is currently on Xarelto and tolerating it fairly well.  PLAN: I discussed the lab result with the patient  today. I recommended for him to continue on anticoagulation for a period of 6-12 months but I would not recommend for him to be on anticoagulation for life. The patient has concern about discontinuing anticoagulation and he would like to continue with the same treatment as he was concerned about recurrent pulmonary embolism. I would consider referring him to Dr. Imagene Gurney at [facility] who is an expert in this area for a second opinion and recommendation regarding the length of anticoagulation. I don't see a need for the patient to continue routine followup visit with me at this point but will be happy to see him in the future if needed. All questions were answered. The patient knows to call the clinic with any problems, questions or concerns. We can certainly see the patient much sooner if necessary.

## 2012-06-21 NOTE — Patient Instructions (Signed)
The hypercoagulable panel showed no significant abnormalities. I would not recommend anticoagulation for life but 6-12 months of treatment is acceptable. I will refer you to Dr. Isaiah Serge at [facility] for a second opinion regarding the length of anticoagulation.

## 2012-07-30 ENCOUNTER — Ambulatory Visit: Payer: Self-pay | Admitting: Family Medicine

## 2013-02-05 IMAGING — CT CT HEAD WITHOUT CONTRAST
1 series · 16 of 30 positions shown, 20 images · non-contrast
Comparison: none

REASON FOR EXAM: CR [id number] or Pager [phone] Headache Dizzines Eval Bleed
COMMENTS:

[Series 2: 1 soft tissue · axial · 0.39mm/px · z∈[+721,+856]mm · 16 of 30 slices shown, 20 images]
[im 2/30  brain]
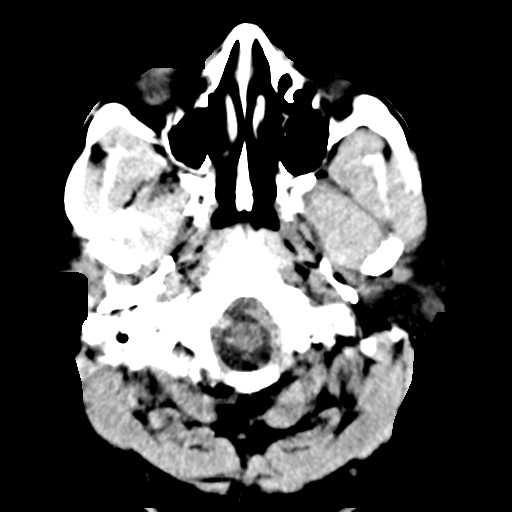
[im 2/30  bone]
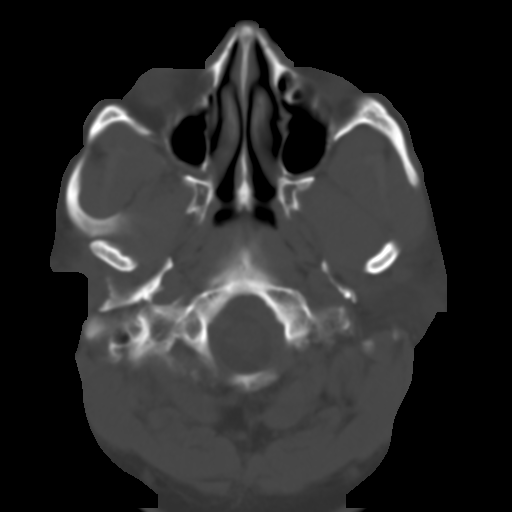
[im 4/30  brain]
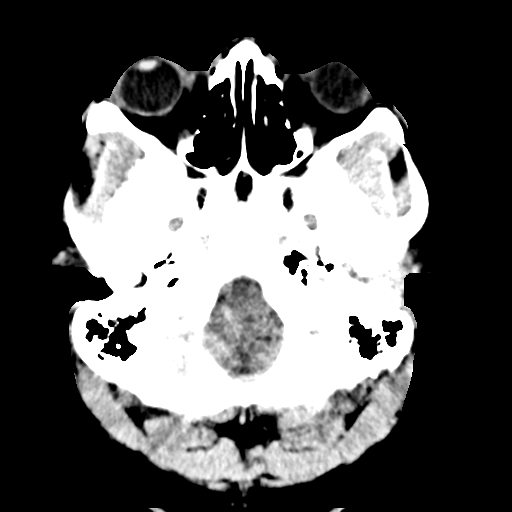
[im 6/30  brain]
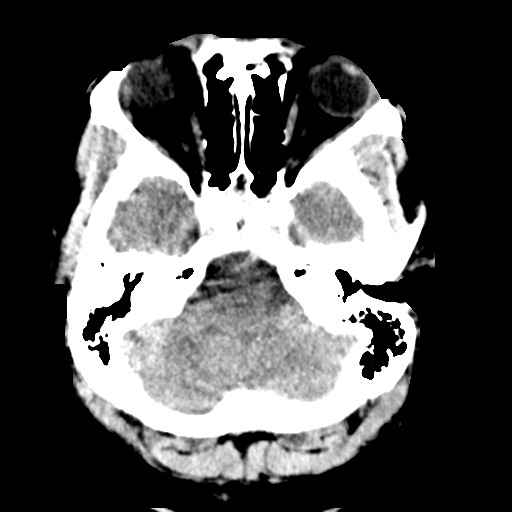
[im 8/30  brain]
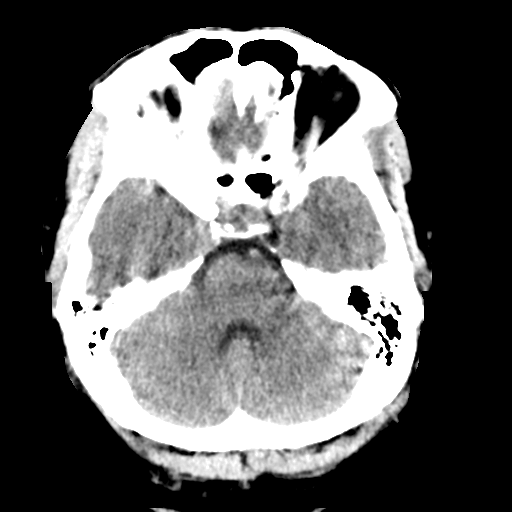
[im 9/30  brain]
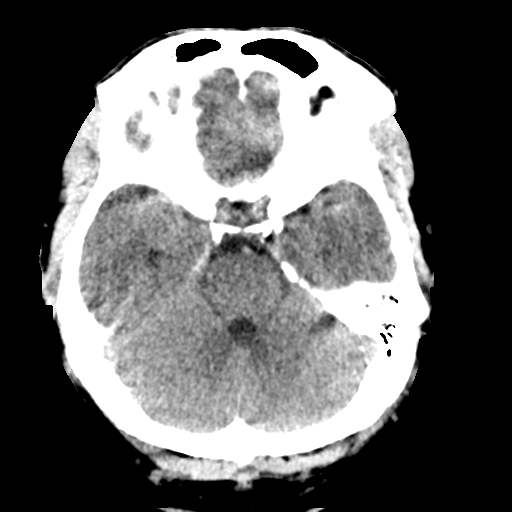
[im 9/30  bone]
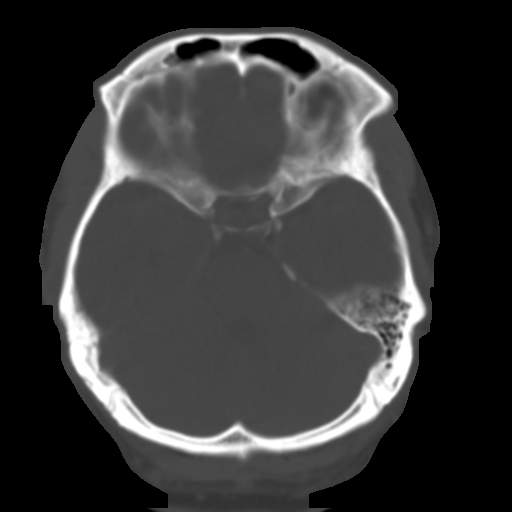
[im 11/30  brain]
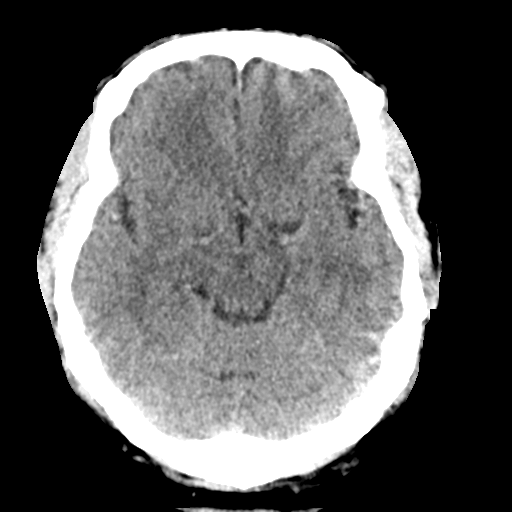
[im 13/30  brain]
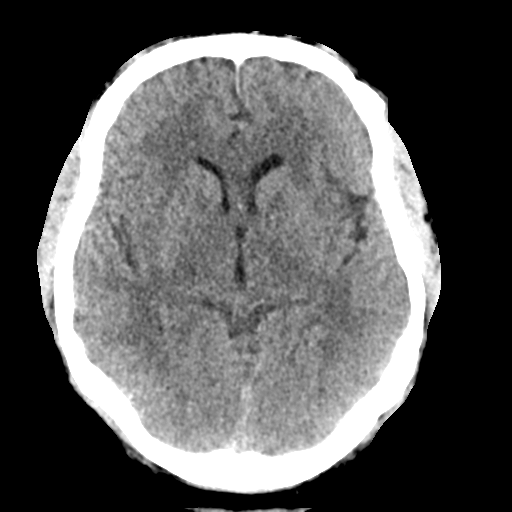
[im 15/30  brain]
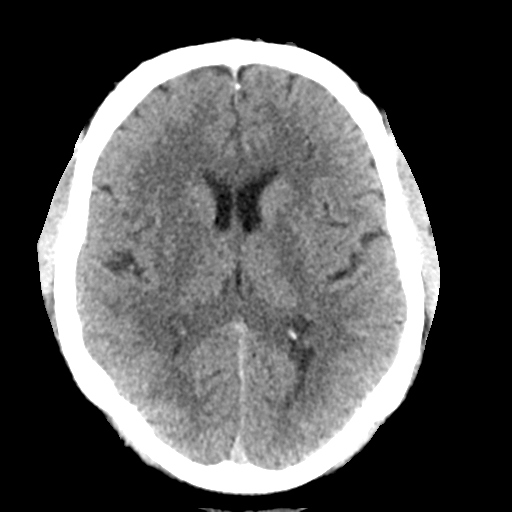
[im 16/30  brain]
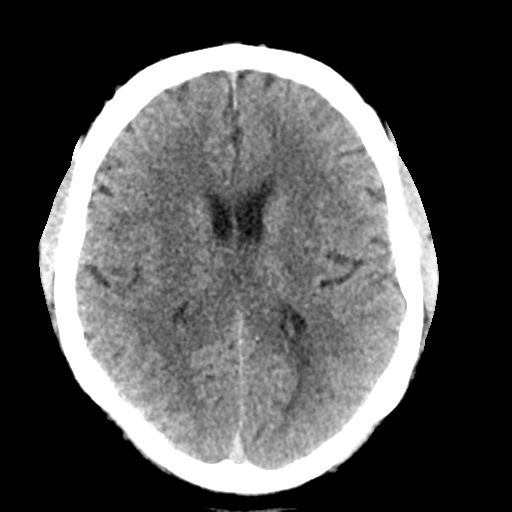
[im 16/30  bone]
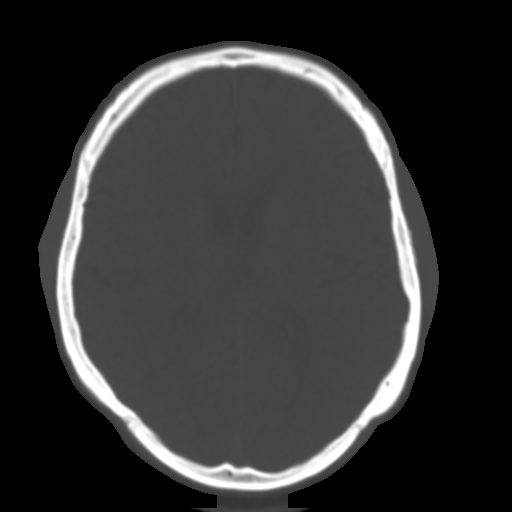
[im 18/30  brain]
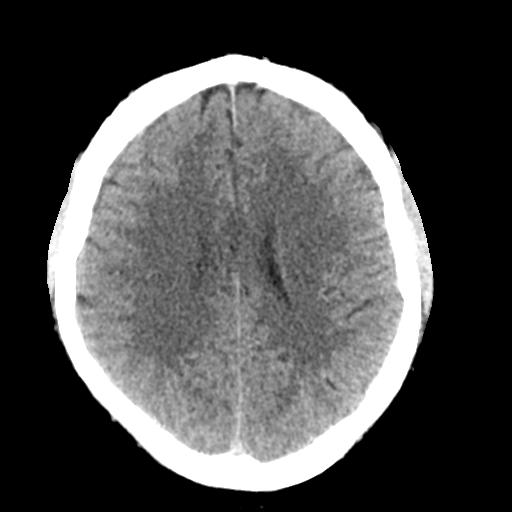
[im 20/30  brain]
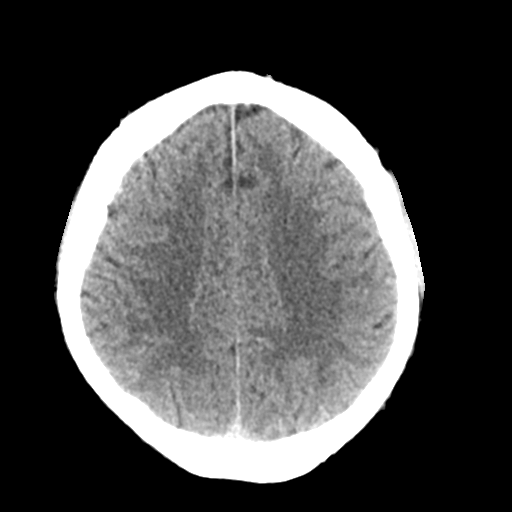
[im 22/30  brain]
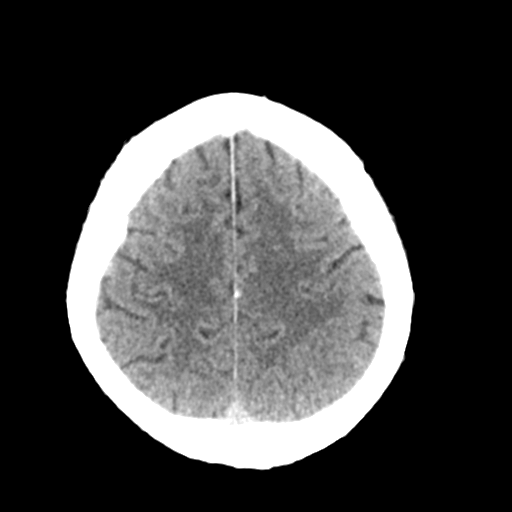
[im 23/30  brain]
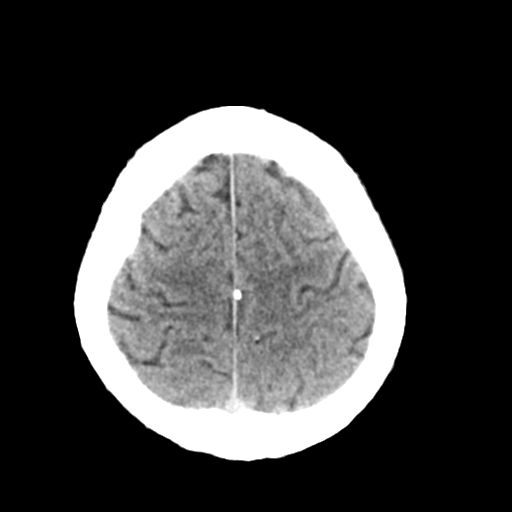
[im 23/30  bone]
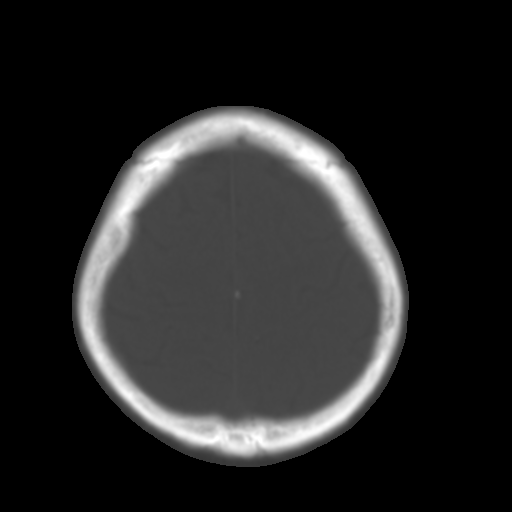
[im 25/30  brain]
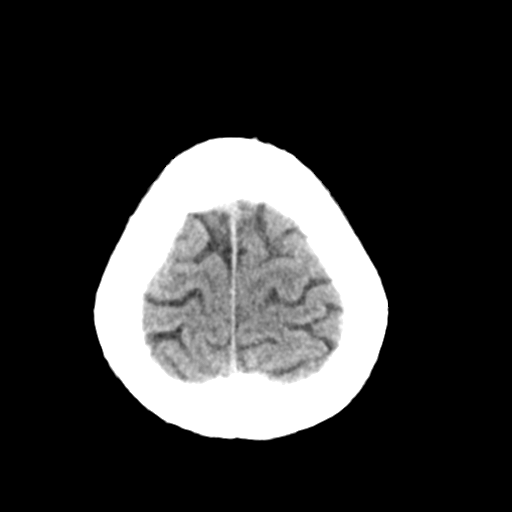
[im 27/30  brain]
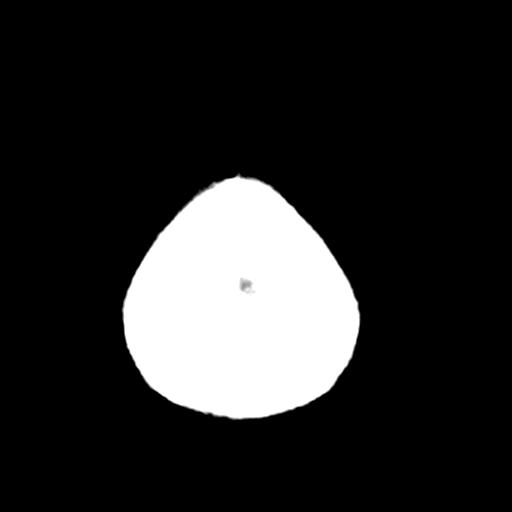
[im 29/30  brain]
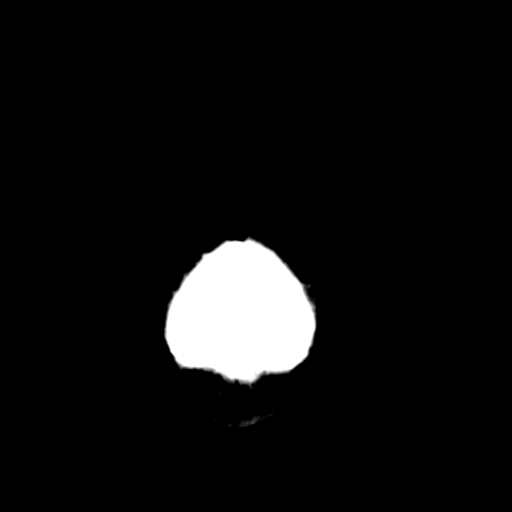

[16 of 30 positions shown; findings below may reference images not displayed]

PROCEDURE:     KCT - KCT HEAD WITHOUT CONTRAST  - November 10, 2011 [DATE]

RESULT:     Axial noncontrast CT scanning was performed through the brain
with reconstructions at 5 mm intervals and slice thicknesses. Comparison
made to study May 31, 2011.

The ventricles are normal in size and position. There is no intracranial
hemorrhage nor the cranial mass effect. There is no evidence of an evolving
ischemic infarction. The cerebellum and brainstem are normal in density. At
bone window settings the observed portions of the paranasal sinuses and
mastoid air cells are clear. There is no evidence of a skull fracture.
IMPRESSION: Normal noncontrast CT scan of the brain.

This report was called by me to Dr. [REDACTED] and the report given to
[HOSPITAL] at [DATE] p.m. on November 10, 2011.

## 2013-08-27 DIAGNOSIS — M47817 Spondylosis without myelopathy or radiculopathy, lumbosacral region: Secondary | ICD-10-CM | POA: Insufficient documentation

## 2013-09-03 ENCOUNTER — Emergency Department (HOSPITAL_COMMUNITY): Payer: Non-veteran care

## 2013-09-03 ENCOUNTER — Encounter (HOSPITAL_COMMUNITY): Payer: Self-pay | Admitting: Emergency Medicine

## 2013-09-03 ENCOUNTER — Emergency Department (HOSPITAL_COMMUNITY)
Admission: EM | Admit: 2013-09-03 | Discharge: 2013-09-03 | Disposition: A | Discharge status: Home / Self Care | Payer: Non-veteran care | Attending: Emergency Medicine | Admitting: Emergency Medicine

## 2013-09-03 DIAGNOSIS — I10 Essential (primary) hypertension: Secondary | ICD-10-CM | POA: Diagnosis not present

## 2013-09-03 DIAGNOSIS — M79609 Pain in unspecified limb: Secondary | ICD-10-CM | POA: Insufficient documentation

## 2013-09-03 DIAGNOSIS — Z86711 Personal history of pulmonary embolism: Secondary | ICD-10-CM | POA: Diagnosis not present

## 2013-09-03 DIAGNOSIS — M7989 Other specified soft tissue disorders: Secondary | ICD-10-CM

## 2013-09-03 DIAGNOSIS — M109 Gout, unspecified: Secondary | ICD-10-CM | POA: Insufficient documentation

## 2013-09-03 DIAGNOSIS — IMO0002 Reserved for concepts with insufficient information to code with codable children: Secondary | ICD-10-CM | POA: Diagnosis not present

## 2013-09-03 DIAGNOSIS — G43909 Migraine, unspecified, not intractable, without status migrainosus: Secondary | ICD-10-CM | POA: Diagnosis not present

## 2013-09-03 DIAGNOSIS — Z880 Allergy status to penicillin: Secondary | ICD-10-CM | POA: Diagnosis not present

## 2013-09-03 DIAGNOSIS — Z87891 Personal history of nicotine dependence: Secondary | ICD-10-CM | POA: Insufficient documentation

## 2013-09-03 DIAGNOSIS — Z7901 Long term (current) use of anticoagulants: Secondary | ICD-10-CM | POA: Insufficient documentation

## 2013-09-03 DIAGNOSIS — Z9889 Other specified postprocedural states: Secondary | ICD-10-CM | POA: Insufficient documentation

## 2013-09-03 DIAGNOSIS — I82409 Acute embolism and thrombosis of unspecified deep veins of unspecified lower extremity: Secondary | ICD-10-CM

## 2013-09-03 DIAGNOSIS — R0789 Other chest pain: Secondary | ICD-10-CM | POA: Diagnosis not present

## 2013-09-03 DIAGNOSIS — E7800 Pure hypercholesterolemia, unspecified: Secondary | ICD-10-CM | POA: Insufficient documentation

## 2013-09-03 DIAGNOSIS — Z79899 Other long term (current) drug therapy: Secondary | ICD-10-CM | POA: Diagnosis not present

## 2013-09-03 LAB — COMPREHENSIVE METABOLIC PANEL
ALK PHOS: 72 U/L (ref 39–117)
ALT: 22 U/L (ref 0–53)
AST: 10 U/L (ref 0–37)
Albumin: 3.9 g/dL (ref 3.5–5.2)
BILIRUBIN TOTAL: 0.2 mg/dL — AB (ref 0.3–1.2)
BUN: 16 mg/dL (ref 6–23)
CHLORIDE: 102 meq/L (ref 96–112)
CO2: 25 meq/L (ref 19–32)
Calcium: 8.9 mg/dL (ref 8.4–10.5)
Creatinine, Ser: 1.27 mg/dL (ref 0.50–1.35)
GFR calc non Af Amer: 59 mL/min — ABNORMAL LOW (ref >90)
GFR, EST AFRICAN AMERICAN: 69 mL/min — AB (ref >90)
GLUCOSE: 100 mg/dL — AB (ref 70–99)
POTASSIUM: 3.3 meq/L — AB (ref 3.7–5.3)
Sodium: 143 mEq/L (ref 137–147)
Total Protein: 7.1 g/dL (ref 6.0–8.3)

## 2013-09-03 LAB — CBC WITH DIFFERENTIAL/PLATELET
Basophils Absolute: 0 10*3/uL (ref 0.0–0.1)
Basophils Relative: 0 % (ref 0–1)
Eosinophils Absolute: 0.3 10*3/uL (ref 0.0–0.7)
Eosinophils Relative: 5 % (ref 0–5)
HCT: 41.5 % (ref 39.0–52.0)
HEMOGLOBIN: 14.3 g/dL (ref 13.0–17.0)
LYMPHS ABS: 2.5 10*3/uL (ref 0.7–4.0)
LYMPHS PCT: 36 % (ref 12–46)
MCH: 29.4 pg (ref 26.0–34.0)
MCHC: 34.5 g/dL (ref 30.0–36.0)
MCV: 85.4 fL (ref 78.0–100.0)
MONOS PCT: 6 % (ref 3–12)
Monocytes Absolute: 0.4 10*3/uL (ref 0.1–1.0)
NEUTROS ABS: 3.7 10*3/uL (ref 1.7–7.7)
NEUTROS PCT: 53 % (ref 43–77)
Platelets: 224 10*3/uL (ref 150–400)
RBC: 4.86 MIL/uL (ref 4.22–5.81)
RDW: 15 % (ref 11.5–15.5)
WBC: 7 10*3/uL (ref 4.0–10.5)

## 2013-09-03 LAB — TROPONIN I: Troponin I: <0.3 ng/mL (ref <0.30)

## 2013-09-03 LAB — PRO B NATRIURETIC PEPTIDE: Pro B Natriuretic peptide (BNP): 50 pg/mL (ref 0–125)

## 2013-09-03 LAB — PROTIME-INR
INR: 1.19 (ref 0.00–1.49)
PROTHROMBIN TIME: 14.8 s (ref 11.6–15.2)

## 2013-09-03 MED ORDER — IOHEXOL 350 MG/ML SOLN
100.0000 mL | Freq: Once | INTRAVENOUS | Status: AC | PRN
  Administered 2013-09-03: 100 mL via INTRAVENOUS

## 2013-09-03 MED ORDER — NITROGLYCERIN 0.4 MG SL SUBL
0.4000 mg | SUBLINGUAL_TABLET | SUBLINGUAL | Status: DC | PRN
  Administered 2013-09-03: 0.4 mg via SUBLINGUAL
  Filled 2013-09-03: qty 1

## 2013-09-03 NOTE — ED Notes (Signed)
Patient transported to CT 

## 2013-09-03 NOTE — ED Notes (Signed)
Hourly rounding on patient, reporting he is having right sided CP 7/10 "burning". States this just started. EKG obtained and given to Dr. Jeanell Sparrow. VSS.

## 2013-09-03 NOTE — ED Provider Notes (Signed)
CSN: [id number]     Arrival date & time 09/03/13  0108 History   First MD Initiated Contact with Patient 09/03/13 0345     Chief Complaint  Patient presents with  . Leg Swelling     (Consider location/radiation/quality/duration/timing/severity/associated sxs/prior Treatment) HPI Patient presents with one day of left leg swelling that started around noon yesterday. He is concerned he may have developed a blood clot. He's had a blood clot in his right leg and is currently taking Zarontin. He states he's been compliant with medication. He denies any chest pain or shortness of breath. Past Medical History  Diagnosis Date  . Gout   . Hypercholesteremia   . Pulmonary embolism   . Hypertension   . Migraine    Past Surgical History  Procedure Laterality Date  . Appendectomy    . Nasal septum surgery    . Tonsillectomy    . Testicle surgery    . Cardiac catheterization     Family History  Problem Relation Age of Onset  . Hypertension Mother   . Hypertension Father    History  Substance Use Topics  . Smoking status: Former Smoker    Quit date: 05/24/1983  . Smokeless tobacco: Not on file  . Alcohol Use: No    Review of Systems  Constitutional: Negative for fever and chills.  Respiratory: Negative for cough and shortness of breath.   Cardiovascular: Negative for chest pain.  Gastrointestinal: Negative for nausea, vomiting and abdominal pain.  Musculoskeletal: Positive for myalgias. Negative for neck pain.  Skin: Negative for rash and wound.  Neurological: Negative for dizziness, weakness, light-headedness, numbness and headaches.  All other systems reviewed and are negative.     Allergies  Penicillins and Amlodipine  Home Medications   Current Outpatient Rx  Name  Route  Sig  Dispense  Refill  . allopurinol (ZYLOPRIM) 300 MG tablet   Oral   Take 300 mg by mouth daily.         . butalbital-acetaminophen-caffeine (FIORICET, ESGIC) 50-325-40 MG per tablet    Oral   Take 1 tablet by mouth 2 (two) times daily as needed for headache.         . finasteride (PROSCAR) 5 MG tablet   Oral   Take 5 mg by mouth daily.         . fluticasone (FLONASE) 50 MCG/ACT nasal spray   Each Nare   Place 1 spray into both nostrils daily.         Marland Kitchen gabapentin (NEURONTIN) 400 MG capsule   Oral   Take 800 mg by mouth 2 (two) times daily.         Marland Kitchen glipiZIDE (GLUCOTROL) 5 MG tablet   Oral   Take 5 mg by mouth daily before breakfast.         . hydrochlorothiazide (MICROZIDE) 12.5 MG capsule   Oral   Take 12.5 mg by mouth daily.         Marland Kitchen loratadine (CLARITIN) 10 MG tablet   Oral   Take 10 mg by mouth daily.         Marland Kitchen losartan (COZAAR) 100 MG tablet   Oral   Take 100 mg by mouth daily.         . niacin 500 MG tablet   Oral   Take 500 mg by mouth daily with breakfast.         . propranolol (INDERAL) 60 MG tablet   Oral   Take 60 mg  by mouth 2 (two) times daily.         . rivaroxaban (XARELTO) 10 MG TABS tablet   Oral   Take 20 mg by mouth daily.         . simvastatin (ZOCOR) 80 MG tablet   Oral   Take 80 mg by mouth at bedtime.         . traMADol (ULTRAM) 50 MG tablet   Oral   Take 50 mg by mouth every 6 (six) hours as needed for moderate pain.          BP 159/91  Pulse 62  Temp(Src) 97.6 F (36.4 C) (Oral)  Resp 16  Ht 5\' 10"  (1.778 m)  Wt 260 lb (117.935 kg)  BMI 37.31 kg/m2  SpO2 94% Physical Exam  Nursing note and vitals reviewed. Constitutional: He is oriented to person, place, and time. He appears well-developed and well-nourished. No distress.  HENT:  Head: Normocephalic and atraumatic.  Mouth/Throat: Oropharynx is clear and moist.  Eyes: EOM are normal. Pupils are equal, round, and reactive to light.  Neck: Normal range of motion. Neck supple.  Cardiovascular: Normal rate and regular rhythm.   Pulmonary/Chest: Effort normal and breath sounds normal. No respiratory distress. He has no wheezes. He  has no rales.  Abdominal: Soft. Bowel sounds are normal. He exhibits no distension and no mass. There is no tenderness. There is no rebound and no guarding.  Musculoskeletal: Normal range of motion. He exhibits no edema and no tenderness.  Patient with left greater than right calf swelling. There is mild tenderness to the popliteal fossa and posterior calf area. He has 2+ dorsalis pedis bilaterally.  Neurological: He is alert and oriented to person, place, and time.  Patient is alert and oriented x3 with clear, goal oriented speech. Patient has 5/5 motor in all extremities. Sensation is intact to light touch. Bilateral finger-to-nose is normal with no signs of dysmetria. Patient has a normal gait and walks without assistance.   Skin: Skin is warm and dry. No rash noted. No erythema.  Psychiatric: He has a normal mood and affect. His behavior is normal.    ED Course  Procedures (including critical care time) Labs Review Labs Reviewed  CBC WITH DIFFERENTIAL  COMPREHENSIVE METABOLIC PANEL  PROTIME-INR  PRO B NATRIURETIC PEPTIDE   Imaging Review No results found.   EKG Interpretation None      MDM   Final diagnoses:  None    Patient will need Doppler study to rule out DVT. Patient signed out to oncoming emergency physician pending Doppler study   Julianne Rice, MD 09/04/13 [phone]

## 2013-09-03 NOTE — ED Notes (Signed)
Pt returned from CT. Hooked up to monitor. Pt refuses another nitro, also offered nausea medicine after becoming nauseated in CT, refused.

## 2013-09-03 NOTE — ED Provider Notes (Signed)
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 He is currently on Zaroxolyn. He was seen and evaluated by Dr. Lita Mains and is pending an ultrasound. Nursing reports that he is now complaining of chest pain. Patient states that he just began having some burning in his anterior chest. He describes it as 7/10. His not dyspneic. He is unable to say if this is like previous chest pain. He states he had some discomfort previously with the PE is unable to associate with today's type of pain. He states he did have a cauterization of his heart several years ago but is unable to tell me the results and this was done in a different health care system. EKG obtained by nursing when he began complaining of chest pain shows diffuse T-wave inversion please see EKG reading for complete interpretation. The patient is having a troponin added. He'll receive nitroglycerin and we will see how this affects his pain. A CT angiogram has also been added to his workup.   EKG Interpretation  Date/Time:  Tuesday September 03 2013 07:50:28 EDT Ventricular Rate:  64 PR Interval:  171 QRS Duration: 105 QT Interval:  418 QTC Calculation: 431 R Axis:   -44 Text Interpretation:  Age not entered, assumed to be  63 years old for purpose of ECG interpretation Sinus rhythm Incomplete RBBB and LAFB Abnormal R-wave progression, late transition Borderline T wave abnormalities Confirmed by Izan Miron MD, Andee Poles [phone]) on 09/03/2013 7:54:16 AM      9:34 AM Ruben Gottron, RN reports patient's pain decreased from 8 to 6/10 after nitro, but patient refused second nitro.  Patient with first troponin negative.  CT angio without pe.  Patient having US performed of lower extremity now.  Plan reassessment after with patient and repeat troponin.   10:21 AM Patient returned from [place] and per rn reports no chest pain now.  Awaiting [place] report and repeat troponin being drawn.   10:58 AM Preliminary report: Left leg is negative  for deep and superficial vein thrombosis    Troponin pending.  Repeat troponin normal.  EKG with changes but stable from prior.  Patient reports normal prior cath.  Plan discharge to follow up with pmd but advised return if return of chest burning or other symptoms.  No evidence of dvt or pe on work up.   Shaune Pollack, MD 09/03/13 [phone]

## 2013-09-03 NOTE — ED Notes (Signed)
Dr. Jeanell Sparrow at bedside. Pt's wife given cranberry juices.

## 2013-09-03 NOTE — ED Notes (Signed)
Pt is in vascular  

## 2013-09-03 NOTE — ED Notes (Signed)
Pt states he noticed swelling to the left leg and pain in the back of his knees bilaterally starting around mid day today.  Pt states the pain is similar to his previous blood clot

## 2013-09-03 NOTE — Progress Notes (Signed)
VASCULAR LAB PRELIMINARY  PRELIMINARY  PRELIMINARY  PRELIMINARY             Left lower extremity venous duplex completed.    Preliminary report: Left leg is negative for deep and superficial vein thrombosis   Margarette Canada, RVT 09/03/2013, 10:09 AM

## 2013-09-03 NOTE — ED Notes (Signed)
Pt states that he takes xarelto due to previous blood clot, states that they checked his levels last week and they were WNL.

## 2013-09-03 NOTE — Discharge Instructions (Signed)
Chest Pain (Nonspecific) °Chest pain has many causes. Your pain could be caused by something serious, such as a heart attack or a blood clot in the lungs. It could also be caused by something less serious, such as a chest bruise or a virus. Follow up with your doctor. More lab tests or other studies may be needed to find the cause of your pain. Most of the time, nonspecific chest pain will improve within 2 to 3 days of rest and mild pain medicine. °HOME CARE °· For chest bruises, you may put ice on the sore area for 15-20 minutes, 03-04 times a day. Do this only if it makes you feel better. °· Put ice in a plastic bag. °· Place a towel between the skin and the bag. °· Rest for the next 2 to 3 days. °· Go back to work if the pain improves. °· See your doctor if the pain lasts longer than 1 to 2 weeks. °· Only take medicine as told by your doctor. °· Quit smoking if you smoke. °GET HELP RIGHT AWAY IF:  °· There is more pain or pain that spreads to the arm, neck, jaw, back, or belly (abdomen). °· You have shortness of breath. °· You cough more than usual or cough up blood. °· You have very bad back or belly pain, feel sick to your stomach (nauseous), or throw up (vomit). °· You have very bad weakness. °· You pass out (faint). °· You have a fever. °Any of these problems may be serious and may be an emergency. Do not wait to see if the problems will go away. Get medical help right away. Call your local emergency services 911 in [place] Do not drive yourself to the hospital. °MAKE SURE YOU:  °· Understand these instructions. °· Will watch this condition. °· Will get help right away if you or your child is not doing well or gets worse. °Document Released: 10/26/2007 Document Revised: 08/01/2011 Document Reviewed: 10/26/2007 °ExitCare® Patient Information ©2014 [employer]. ° °

## 2013-09-03 NOTE — ED Notes (Signed)
Spoke with vascular, they report patient is on list for transport.

## 2013-09-03 NOTE — ED Notes (Signed)
Spoke with vascular who reports 30 mins before patient has venous duplex completed.

## 2013-09-03 NOTE — ED Notes (Signed)
PT REPORTS 0/10 CP. REPORTS PAIN TO LEFT LOWER LEG 8/10.

## 2013-09-27 ENCOUNTER — Encounter: Payer: Self-pay | Admitting: Cardiology

## 2013-09-27 ENCOUNTER — Ambulatory Visit (INDEPENDENT_AMBULATORY_CARE_PROVIDER_SITE_OTHER): Payer: BC Managed Care – PPO | Admitting: Cardiology

## 2013-09-27 VITALS — BP 136/84 | HR 68 | Ht 70.0 in | Wt 250.0 lb

## 2013-09-27 DIAGNOSIS — R9431 Abnormal electrocardiogram [ECG] [EKG]: Secondary | ICD-10-CM

## 2013-09-27 DIAGNOSIS — I10 Essential (primary) hypertension: Secondary | ICD-10-CM

## 2013-09-27 DIAGNOSIS — I82409 Acute embolism and thrombosis of unspecified deep veins of unspecified lower extremity: Secondary | ICD-10-CM

## 2013-09-27 DIAGNOSIS — I77810 Thoracic aortic ectasia: Secondary | ICD-10-CM | POA: Insufficient documentation

## 2013-09-27 DIAGNOSIS — E7800 Pure hypercholesterolemia, unspecified: Secondary | ICD-10-CM | POA: Insufficient documentation

## 2013-09-27 DIAGNOSIS — R079 Chest pain, unspecified: Secondary | ICD-10-CM

## 2013-09-27 HISTORY — DX: Thoracic aortic ectasia: I77.810

## 2013-09-27 HISTORY — DX: Chest pain, unspecified: R07.9

## 2013-09-27 HISTORY — DX: Abnormal electrocardiogram (ECG) (EKG): R94.31

## 2013-09-27 MED ORDER — ATORVASTATIN CALCIUM 40 MG PO TABS: 40.0000 mg | ORAL_TABLET | Freq: Every day | ORAL | Status: DC

## 2013-09-27 NOTE — Progress Notes (Addendum)
[facility] [address], [address], [place]  [address] Phone: [phone] Fax:  [phone]  Date:  09/27/2013   ID:  [name] Whitaker, DOB 1951/02/26, MRN [id number]  PCP:  No primary provider on file.   History of Present Illness: [name] Whitaker is a 63 y.o. male here for evaluation at the request of Dr. Jeanell Sparrow for abnormal EKG, having been seen in emergency room on 09/03/13. He has a history of DVT and PE and presented with increased leg swelling at the emergency department. Left leg was more edematous than right. Has had 2 prior thrombotic episodes. Left home at 1am to get evaluated. Right of sternum, like hit with a hammer. Felt more in back pushing forward. NTG.   burning in his anterior chest, 7/10 without dyspnea. His EKG showed diffuse T wave inversion. Has incomplete right bundle branch block and left anterior fascicular block. Troponin was normal at 0.30. BNP was 50. CT scan 4.4 cm ascending root. No evidence of acute DVT on ultrasound.  Had cardiac cath at [facility]. 2014. Was told that he had moderate disease, 50%, no PCI.  He is transitioning over his primary care to Dr. Leighton Ruff.  He has not been experiencing any more exertional chest discomfort. 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 Looking to get back to work. Trying to lose weight.   Wt Readings from Last 3 Encounters:  09/27/13 250 lb (113.399 kg)  09/03/13 260 lb (117.935 kg)  06/21/12 265 lb 11.2 oz (120.521 kg)     Past Medical History  Diagnosis Date  . Gout   . Hypercholesteremia   . Pulmonary embolism   . Hypertension   . Migraine     Past Surgical History  Procedure Laterality Date  . Appendectomy    . Nasal septum surgery    . Tonsillectomy    . Testicle surgery    . Cardiac catheterization      Current Outpatient Prescriptions  Medication Sig Dispense Refill  . acetaminophen (TYLENOL) 325 MG tablet Take 650 mg by mouth every 6 (six) hours as needed.      Marland Kitchen allopurinol  (ZYLOPRIM) 300 MG tablet Take 300 mg by mouth daily.      . finasteride (PROSCAR) 5 MG tablet Take 5 mg by mouth daily.      . fluticasone (FLONASE) 50 MCG/ACT nasal spray Place 1 spray into both nostrils daily.      Marland Kitchen gabapentin (NEURONTIN) 400 MG capsule Take 800 mg by mouth 2 (two) times daily.      Marland Kitchen glipiZIDE (GLUCOTROL) 5 MG tablet Take 5 mg by mouth daily before breakfast.      . hydrochlorothiazide (MICROZIDE) 12.5 MG capsule Take 12.5 mg by mouth daily.      Marland Kitchen loratadine (CLARITIN) 10 MG tablet Take 10 mg by mouth daily.      Marland Kitchen losartan (COZAAR) 100 MG tablet Take 100 mg by mouth daily.      . niacin 500 MG tablet Take 500 mg by mouth daily with breakfast.      . propranolol (INDERAL) 60 MG tablet Take 60 mg by mouth 2 (two) times daily.      . rivaroxaban (XARELTO) 10 MG TABS tablet Take 20 mg by mouth daily.      . simvastatin (ZOCOR) 80 MG tablet Take 80 mg by mouth at bedtime.      . traMADol (ULTRAM) 50 MG tablet Take 50 mg by mouth every 6 (six)  hours as needed for moderate pain.      . potassium chloride SA (K-DUR,KLOR-CON) 20 MEQ tablet Take 1 tab daily       No current facility-administered medications for this visit.    Allergies:    Allergies  Allergen Reactions  . Penicillins Shortness Of Breath  . Amlodipine Swelling    Social History:  The patient  reports that he quit smoking about 30 years ago. He does not have any smokeless tobacco history on file. He reports that he does not drink alcohol or use illicit drugs.   Family History  Problem Relation Age of Onset  . Hypertension Mother   . Hypertension Father     ROS:  Please see the history of present illness.   Denies any fevers, chills, orthopnea, PND, syncope.   All other systems reviewed and negative.   PHYSICAL EXAM: VS:  BP 136/84  Pulse 68  Ht 5\' 10"  (1.778 m)  Wt 250 lb (113.399 kg)  BMI 35.87 kg/m2 Well nourished, well developed, in no acute distress HEENT: normal, [place]/AT, EOMI Neck: no JVD,  normal carotid upstroke, no bruit Cardiac:  normal S1, S2; RRR; no murmur Lungs:  clear to auscultation bilaterally, no wheezing, rhonchi or rales Abd: soft, nontender, no hepatomegaly, no bruitsoverweight Ext: trace B edema, 2+ distal pulses Skin: warm and dry GU: deferred Neuro: no focal abnormalities noted, AAO x 3  EKG:  Sinus rhythm, incomplete right bundle branch block, left anterior fascicular block, diffuse T wave inversion.  Cardiac catheterization 07/14/2011-[facility]   -distal left main 10%, mid LAD 50%, circumflex nondominant, minor luminal irregularities. RCA normal. Left ventriculogram mildly depressed.  Right atrium 5, right ventricle 16/2, pulmonary artery 6/2?, wedge 5  ASSESSMENT AND PLAN:  1. Abnormal EKG - T-wave inversion is new when compared to EKG from 2013. The patient does state that he has had a heart catheterization however within the past year at [facility] which showed moderate CAD, up to 50% but no PCI needed. He has been treated medically. He continues to be on beta blocker and ACE inhibitor. He has not had any further chest discomfort with exertion. We will continue with current regimen. I will see him back in 2 months to followup symptoms. 2. Hyperlipidemia-he admits to me that he has not been taking his simvastatin due to  Myalgias. I will transition him over to atorvastatin 40 mg once a day. I will have him stop his simvastatin and stopped his niacin (has not been taking for a while). He states that since stopping his simvastatin, his muscles feel better. I will check ALT and lipid profile in 2 months. 3. CAD-moderate disease. 50% LAD, Medical management. Catheterization 07/14/11.  4. 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 History of PE. 5. Hypertension-multidrug regimen. Doing well. 6. 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 We will continue to monitor 7. 2 mths follow up.   Signed, Candee Furbish, MD [facility]  09/27/2013 12:43 PM

## 2013-09-27 NOTE — Patient Instructions (Signed)
Your physician has recommended you make the following change in your medication:   1. Stop Simvastatin 2. Stop Niacin 3. Start Atorvastatin 40 mg once daily  Your physician recommends that you return for lab work in: 2 months, ALT, Lipid (Fasting)  Your physician recommends that you schedule a follow-up appointment in: 2 months with Dr. Marlou Porch

## 2013-11-27 ENCOUNTER — Encounter: Payer: Self-pay | Admitting: *Deleted

## 2013-11-27 ENCOUNTER — Encounter: Payer: Self-pay | Admitting: Cardiology

## 2013-11-27 ENCOUNTER — Ambulatory Visit (INDEPENDENT_AMBULATORY_CARE_PROVIDER_SITE_OTHER): Payer: BC Managed Care – PPO | Admitting: Cardiology

## 2013-11-27 ENCOUNTER — Other Ambulatory Visit: Payer: Non-veteran care

## 2013-11-27 VITALS — BP 114/86 | HR 60 | Ht 70.0 in | Wt 254.0 lb

## 2013-11-27 DIAGNOSIS — R0602 Shortness of breath: Secondary | ICD-10-CM

## 2013-11-27 DIAGNOSIS — I77810 Thoracic aortic ectasia: Secondary | ICD-10-CM

## 2013-11-27 DIAGNOSIS — Z7901 Long term (current) use of anticoagulants: Secondary | ICD-10-CM

## 2013-11-27 DIAGNOSIS — E7800 Pure hypercholesterolemia, unspecified: Secondary | ICD-10-CM

## 2013-11-27 DIAGNOSIS — I10 Essential (primary) hypertension: Secondary | ICD-10-CM

## 2013-11-27 LAB — LIPID PANEL
CHOLESTEROL: 97 mg/dL (ref 0–200)
HDL: 28.7 mg/dL — ABNORMAL LOW (ref >39.00)
LDL CALC: 50 mg/dL (ref 0–99)
NonHDL: 68.3
Total CHOL/HDL Ratio: 3
Triglycerides: 94 mg/dL (ref 0.0–149.0)
VLDL: 18.8 mg/dL (ref 0.0–40.0)

## 2013-11-27 NOTE — Patient Instructions (Signed)
The current medical regimen is effective;  continue present plan and medications.  Please have blood work today (Lipid panel)  Your physician has requested that you have an echocardiogram. Echocardiography is a painless test that uses sound waves to create images of your heart. It provides your doctor with information about the size and shape of your heart and how well your heart's chambers and valves are working. This procedure takes approximately one hour. There are no restrictions for this procedure.  Follow up in 6 months with Dr Marlou Porch.  You will receive a letter in the mail 2 months before you are due.  Please call us when you receive this letter to schedule your follow up appointment.

## 2013-11-27 NOTE — Progress Notes (Signed)
[name] Whitaker. [address], [address] [place], [place]  [address] Phone: [phone] Fax:  [phone]  Date:  11/27/2013   ID:  [name] Whitaker, DOB [birth year], MRN [id number]  PCP:  No primary provider on file.   History of Present Illness: [name] Whitaker is a 63 y.o. male here for evaluation at the request of [name] Whitaker for abnormal EKG, having been seen in emergency room on 09/03/13. He has a history of DVT and PE and presented with increased leg swelling at the emergency department. Left leg was more edematous than right. Has had 2 prior thrombotic episodes. Left home at 1am to get evaluated. 09/03/13: PE CT negative.  Right of sternum, like hit with a hammer. Felt more in back pushing forward. NTG.   burning in his anterior chest, 7/10 without dyspnea. His EKG showed diffuse T wave inversion. Has incomplete right bundle branch block and left anterior fascicular block. Troponin was normal at 0.30. BNP was 50. CT scan 4.4 cm ascending root. No evidence of acute DVT on ultrasound.  Had cardiac cath at [facility]. 2014. Was told that he had moderate disease, 50%, no PCI.  He is transitioning over his primary care to [name] Whitaker.  He has not been experiencing any more exertional chest discomfort. 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 Looking to get back to work. Trying to lose weight.  He does state that he is having some dyspnea on exertion when walking. Relieved with rest. Mild to moderate degree. Prior cardiac catheterization showed mild reduction in left ventricular function.   Wt Readings from Last 3 Encounters:  11/27/13 254 lb (115.214 kg)  09/27/13 250 lb (113.399 kg)  09/03/13 260 lb (117.935 kg)     Past Medical History  Diagnosis Date  . Gout   . Hypercholesteremia   . Pulmonary embolism   . Hypertension   . Migraine     Past Surgical History  Procedure Laterality Date  . Appendectomy    . Nasal septum surgery    . Tonsillectomy    . Testicle  surgery    . Cardiac catheterization      Current Outpatient Prescriptions  Medication Sig Dispense Refill  . acetaminophen (TYLENOL) 325 MG tablet Take 650 mg by mouth every 6 (six) hours as needed.      Marland Kitchen allopurinol (ZYLOPRIM) 300 MG tablet Take 300 mg by mouth daily.      Marland Kitchen atorvastatin (LIPITOR) 40 MG tablet Take 1 tablet (40 mg total) by mouth daily.  30 tablet  3  . finasteride (PROSCAR) 5 MG tablet Take 5 mg by mouth daily.      . fluticasone (FLONASE) 50 MCG/ACT nasal spray Place 1 spray into both nostrils daily.      Marland Kitchen gabapentin (NEURONTIN) 400 MG capsule Take 800 mg by mouth 2 (two) times daily.      Marland Kitchen glipiZIDE (GLUCOTROL) 5 MG tablet Take 5 mg by mouth daily before breakfast.      . hydrochlorothiazide (MICROZIDE) 12.5 MG capsule Take 12.5 mg by mouth daily.      Marland Kitchen ketoconazole (NIZORAL) 2 % cream       . loratadine (CLARITIN) 10 MG tablet Take 10 mg by mouth daily.      Marland Kitchen losartan (COZAAR) 100 MG tablet Take 100 mg by mouth daily.      . potassium chloride SA (K-DUR,KLOR-CON) 20 MEQ tablet Take 1 tab daily      . propranolol (INDERAL) 60  MG tablet Take 60 mg by mouth 2 (two) times daily.      . rivaroxaban (XARELTO) 10 MG TABS tablet Take 20 mg by mouth daily.      . traMADol (ULTRAM) 50 MG tablet Take 50 mg by mouth every 6 (six) hours as needed for moderate pain.       No current facility-administered medications for this visit.    Allergies:    Allergies  Allergen Reactions  . Penicillins Shortness Of Breath  . Amlodipine Swelling    Social History:  The patient  reports that he quit smoking about 30 years ago. He does not have any smokeless tobacco history on file. He reports that he does not drink alcohol or use illicit drugs.   Family History  Problem Relation Age of Onset  . Hypertension Mother   . Hypertension Father     ROS:  Please see the history of present illness.   Denies any fevers, chills, orthopnea, PND, syncope.   All other systems reviewed and  negative.   PHYSICAL EXAM: VS:  BP 114/86  Pulse 60  Ht 5\' 10"  (1.778 m)  Wt 254 lb (115.214 kg)  BMI 36.45 kg/m2 Well nourished, well developed, in no acute distress HEENT: normal, [name] Whitaker/AT, EOMI Neck: no JVD, normal carotid upstroke, no bruit Cardiac:  normal S1, S2; RRR; no murmur Lungs:  clear to auscultation bilaterally, no wheezing, rhonchi or rales Abd: soft, nontender, no hepatomegaly, no bruitsoverweight Ext: trace B edema, 2+ distal pulses Skin: warm and dry GU: deferred Neuro: no focal abnormalities noted, AAO x 3  EKG:  Sinus rhythm, incomplete right bundle branch block, left anterior fascicular block, diffuse T wave inversion.  Cardiac catheterization 07/14/2011-[facility]   -distal left main 10%, mid LAD 50%, circumflex nondominant, minor luminal irregularities. RCA normal. Left ventriculogram mildly depressed.  Right atrium 5, right ventricle 16/2, pulmonary artery 6/2?, wedge 38mmHg.  ASSESSMENT AND PLAN:  1. Dyspnea-I will check an echocardiogram. Cardiac catheterization from 2013 reviewed and showed mild global reduction in left ventricular function. I do want to make sure that he does not have a cardiomyopathy. 2. Abnormal EKG - T-wave inversion is new when compared to EKG from 2013. The patient does state that he has had a heart catheterization however within the past year at [facility] which showed moderate CAD, up to 50% but no PCI needed. He has been treated medically. He continues to be on beta blocker and ACE inhibitor. He has not had any further chest discomfort with exertion. We will continue with current regimen. I will see him back in 6 months to followup symptoms. 3. Hyperlipidemia-he admits to me that he has not been taking his simvastatin due to  Myalgias. I will transition him over to atorvastatin 40 mg once a day. I will have him stop his simvastatin and stopped his niacin (has not been taking for a while). He states that since stopping  his simvastatin, his muscles feel better. I will check ALT and lipid profile. Tolerating atorvastatin fairly well. 4. CAD-moderate disease. 50% LAD, Medical management. Catheterization 07/14/11.  5. 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 History of PE. 6. Hypertension-multidrug regimen. Doing well. 7. 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 We will continue to monitor 8. 6 mths follow up.   Signed, Candee Furbish, MD [facility]  11/27/2013 8:45 AM

## 2013-12-10 ENCOUNTER — Ambulatory Visit (HOSPITAL_COMMUNITY): Payer: BC Managed Care – PPO | Attending: Cardiovascular Disease | Admitting: Radiology

## 2013-12-10 DIAGNOSIS — Z87891 Personal history of nicotine dependence: Secondary | ICD-10-CM | POA: Insufficient documentation

## 2013-12-10 DIAGNOSIS — R079 Chest pain, unspecified: Secondary | ICD-10-CM | POA: Diagnosis not present

## 2013-12-10 DIAGNOSIS — I379 Nonrheumatic pulmonary valve disorder, unspecified: Secondary | ICD-10-CM | POA: Diagnosis not present

## 2013-12-10 DIAGNOSIS — G4733 Obstructive sleep apnea (adult) (pediatric): Secondary | ICD-10-CM | POA: Insufficient documentation

## 2013-12-10 DIAGNOSIS — I517 Cardiomegaly: Secondary | ICD-10-CM | POA: Diagnosis not present

## 2013-12-10 DIAGNOSIS — I059 Rheumatic mitral valve disease, unspecified: Secondary | ICD-10-CM | POA: Diagnosis not present

## 2013-12-10 DIAGNOSIS — E785 Hyperlipidemia, unspecified: Secondary | ICD-10-CM | POA: Diagnosis not present

## 2013-12-10 DIAGNOSIS — I77810 Thoracic aortic ectasia: Secondary | ICD-10-CM

## 2013-12-10 DIAGNOSIS — R0602 Shortness of breath: Secondary | ICD-10-CM | POA: Insufficient documentation

## 2013-12-10 DIAGNOSIS — I079 Rheumatic tricuspid valve disease, unspecified: Secondary | ICD-10-CM | POA: Insufficient documentation

## 2013-12-10 DIAGNOSIS — I10 Essential (primary) hypertension: Secondary | ICD-10-CM | POA: Insufficient documentation

## 2013-12-10 DIAGNOSIS — I2510 Atherosclerotic heart disease of native coronary artery without angina pectoris: Secondary | ICD-10-CM | POA: Diagnosis not present

## 2013-12-10 DIAGNOSIS — I359 Nonrheumatic aortic valve disorder, unspecified: Secondary | ICD-10-CM | POA: Diagnosis not present

## 2013-12-10 DIAGNOSIS — Z86718 Personal history of other venous thrombosis and embolism: Secondary | ICD-10-CM | POA: Insufficient documentation

## 2013-12-10 NOTE — Progress Notes (Signed)
Echocardiogram performed.  

## 2013-12-17 ENCOUNTER — Telehealth: Payer: Self-pay | Admitting: Cardiology

## 2013-12-17 NOTE — Telephone Encounter (Signed)
Reviewed results of echo with pt again

## 2013-12-17 NOTE — Telephone Encounter (Signed)
New message     Talk to [facility] again----he spoke to a nurse this am but have another question to ask

## 2014-02-05 ENCOUNTER — Telehealth: Payer: Self-pay | Admitting: Cardiology

## 2014-02-05 NOTE — Telephone Encounter (Signed)
New message    Office calling   Patient having colonoscopy on Monday  9/21 . Please advise on xarelto 10 mg

## 2014-02-05 NOTE — Telephone Encounter (Signed)
Will forward to Dr Skains for review and orders 

## 2014-02-07 NOTE — Telephone Encounter (Signed)
He is on Xarelto secondary to recurrent DVT/hypercoagulable state (not AFIB). Please forward to Dr. Inda Merlin for further recommendations. Thanks.

## 2014-02-07 NOTE — Telephone Encounter (Signed)
Pam, Dr. Marlou Porch ment Dr. Julien Nordmann the oncologist. I am Dr. Fletcher Anon :)

## 2014-05-09 ENCOUNTER — Ambulatory Visit: Payer: Non-veteran care | Admitting: Internal Medicine

## 2014-05-16 ENCOUNTER — Encounter (HOSPITAL_COMMUNITY): Payer: Self-pay | Admitting: Emergency Medicine

## 2014-05-16 DIAGNOSIS — Z86718 Personal history of other venous thrombosis and embolism: Secondary | ICD-10-CM | POA: Diagnosis not present

## 2014-05-16 DIAGNOSIS — I10 Essential (primary) hypertension: Secondary | ICD-10-CM | POA: Diagnosis not present

## 2014-05-16 DIAGNOSIS — R0602 Shortness of breath: Secondary | ICD-10-CM | POA: Diagnosis not present

## 2014-05-16 DIAGNOSIS — Z880 Allergy status to penicillin: Secondary | ICD-10-CM | POA: Insufficient documentation

## 2014-05-16 DIAGNOSIS — Z7901 Long term (current) use of anticoagulants: Secondary | ICD-10-CM | POA: Diagnosis not present

## 2014-05-16 DIAGNOSIS — Z79899 Other long term (current) drug therapy: Secondary | ICD-10-CM | POA: Insufficient documentation

## 2014-05-16 DIAGNOSIS — M109 Gout, unspecified: Secondary | ICD-10-CM | POA: Diagnosis not present

## 2014-05-16 DIAGNOSIS — M79651 Pain in right thigh: Secondary | ICD-10-CM | POA: Insufficient documentation

## 2014-05-16 DIAGNOSIS — R0789 Other chest pain: Secondary | ICD-10-CM | POA: Diagnosis not present

## 2014-05-16 DIAGNOSIS — I209 Angina pectoris, unspecified: Secondary | ICD-10-CM | POA: Insufficient documentation

## 2014-05-16 DIAGNOSIS — R079 Chest pain, unspecified: Secondary | ICD-10-CM | POA: Diagnosis present

## 2014-05-16 DIAGNOSIS — Z87891 Personal history of nicotine dependence: Secondary | ICD-10-CM | POA: Insufficient documentation

## 2014-05-16 DIAGNOSIS — Z9889 Other specified postprocedural states: Secondary | ICD-10-CM | POA: Insufficient documentation

## 2014-05-16 DIAGNOSIS — Z7951 Long term (current) use of inhaled steroids: Secondary | ICD-10-CM | POA: Diagnosis not present

## 2014-05-16 DIAGNOSIS — G43909 Migraine, unspecified, not intractable, without status migrainosus: Secondary | ICD-10-CM | POA: Diagnosis not present

## 2014-05-16 DIAGNOSIS — M79652 Pain in left thigh: Secondary | ICD-10-CM | POA: Insufficient documentation

## 2014-05-16 DIAGNOSIS — Z86711 Personal history of pulmonary embolism: Secondary | ICD-10-CM | POA: Insufficient documentation

## 2014-05-16 DIAGNOSIS — M5489 Other dorsalgia: Secondary | ICD-10-CM | POA: Insufficient documentation

## 2014-05-16 DIAGNOSIS — E780 Pure hypercholesterolemia: Secondary | ICD-10-CM | POA: Insufficient documentation

## 2014-05-16 NOTE — ED Notes (Signed)
Patient here with complaint of chest pain and upper medial back pain with shortness of breath. States that he had a procedure done on his back this past Tuesday to help with his back pain. Reports history of angina but never had this much pain.

## 2014-05-17 ENCOUNTER — Encounter (HOSPITAL_COMMUNITY): Payer: Self-pay

## 2014-05-17 ENCOUNTER — Emergency Department (HOSPITAL_COMMUNITY)
Admission: EM | Admit: 2014-05-17 | Discharge: 2014-05-17 | Disposition: A | Discharge status: Home / Self Care | Payer: BC Managed Care – PPO | Attending: Emergency Medicine | Admitting: Emergency Medicine

## 2014-05-17 ENCOUNTER — Emergency Department (HOSPITAL_COMMUNITY): Payer: BC Managed Care – PPO

## 2014-05-17 DIAGNOSIS — Z86711 Personal history of pulmonary embolism: Secondary | ICD-10-CM

## 2014-05-17 DIAGNOSIS — R0781 Pleurodynia: Secondary | ICD-10-CM

## 2014-05-17 LAB — CBC
HCT: 42.6 % (ref 39.0–52.0)
Hemoglobin: 14.4 g/dL (ref 13.0–17.0)
MCH: 29 pg (ref 26.0–34.0)
MCHC: 33.8 g/dL (ref 30.0–36.0)
MCV: 85.7 fL (ref 78.0–100.0)
PLATELETS: 203 10*3/uL (ref 150–400)
RBC: 4.97 MIL/uL (ref 4.22–5.81)
RDW: 14.6 % (ref 11.5–15.5)
WBC: 7.2 10*3/uL (ref 4.0–10.5)

## 2014-05-17 LAB — BASIC METABOLIC PANEL
ANION GAP: 12 (ref 5–15)
BUN: 15 mg/dL (ref 6–23)
CALCIUM: 8.9 mg/dL (ref 8.4–10.5)
CO2: 23 mmol/L (ref 19–32)
Chloride: 102 mEq/L (ref 96–112)
Creatinine, Ser: 1.36 mg/dL — ABNORMAL HIGH (ref 0.50–1.35)
GFR calc Af Amer: 63 mL/min — ABNORMAL LOW (ref >90)
GFR calc non Af Amer: 55 mL/min — ABNORMAL LOW (ref >90)
Glucose, Bld: 185 mg/dL — ABNORMAL HIGH (ref 70–99)
Potassium: 3.3 mmol/L — ABNORMAL LOW (ref 3.5–5.1)
SODIUM: 137 mmol/L (ref 135–145)

## 2014-05-17 LAB — I-STAT TROPONIN, ED: TROPONIN I, POC: 0.01 ng/mL (ref 0.00–0.08)

## 2014-05-17 LAB — BRAIN NATRIURETIC PEPTIDE: B Natriuretic Peptide: 33.8 pg/mL (ref 0.0–100.0)

## 2014-05-17 LAB — PROTIME-INR
INR: 1.41 (ref 0.00–1.49)
Prothrombin Time: 17.4 seconds — ABNORMAL HIGH (ref 11.6–15.2)

## 2014-05-17 MED ORDER — NAPROXEN 250 MG PO TABS: 250.0000 mg | ORAL_TABLET | Freq: Two times a day (BID) | ORAL | Status: DC

## 2014-05-17 MED ORDER — SODIUM CHLORIDE 0.9 % IV SOLN
INTRAVENOUS | Status: DC
  Administered 2014-05-17: 04:00:00 via INTRAVENOUS

## 2014-05-17 MED ORDER — MORPHINE SULFATE 4 MG/ML IJ SOLN
4.0000 mg | Freq: Once | INTRAMUSCULAR | Status: AC
  Administered 2014-05-17: 4 mg via INTRAVENOUS
  Filled 2014-05-17: qty 1

## 2014-05-17 MED ORDER — ONDANSETRON HCL 4 MG/2ML IJ SOLN
4.0000 mg | Freq: Once | INTRAMUSCULAR | Status: AC
  Administered 2014-05-17: 4 mg via INTRAVENOUS
  Filled 2014-05-17: qty 2

## 2014-05-17 MED ORDER — IOHEXOL 350 MG/ML SOLN
100.0000 mL | Freq: Once | INTRAVENOUS | Status: AC | PRN
  Administered 2014-05-17: 100 mL via INTRAVENOUS

## 2014-05-17 MED ORDER — CYCLOBENZAPRINE HCL 10 MG PO TABS: 10.0000 mg | ORAL_TABLET | Freq: Three times a day (TID) | ORAL | Status: DC | PRN

## 2014-05-17 NOTE — ED Notes (Signed)
Tomi Bamberger, MD at bedside.

## 2014-05-17 NOTE — ED Provider Notes (Signed)
CSN: [id number]     Arrival date & time 05/16/14  2339 History  This chart was scribed for Janice Norrie, MD by Peyton Bottoms, ED Scribe. This patient was seen in room A11C/A11C and the patient's care was started at 2:58 AM.   Chief Complaint  Patient presents with  . Chest Pain   The history is provided by the patient. No language interpreter was used.   HPI Comments: [name] Whitaker is a 63 y.o. male with a history of angina, gout, hypercholesteremia, pulmonary embolism, hypertension, and migraine, who presents to the Emergency Department complaining of moderate back pain that began 2 days ago and midsternal chest pain that began yesterday. He describes his chest pain as intermittent, pressure pain in his lower central chest that lasts a  few hours at a time. He states that the chest pain worsens with breathing in deeply, and turning. Nothing makes the chest pain feel better. He also states that "my chest feels warm". He states that the pain radiates to his back with movement. He states he has constant, sharp upper back pain located between his shoulder blades. He states that his back pain started before the onset of chest pain. He reports associated SOB. He states he has had a recent radiofrequency ablation surgery 4 days ago in his lower lumbar spine. He reports associated pain in the back of his thighs bilaterally. He denies associated cough, fever or abdominal pain. He denies swelling of his legs. Patient states he currently takes Xarelto for history of DVT and pulmonary embolus. He denies history of similar symptoms like he is having today. He denies recent travel. He did stop taking his xarelto for 1 day prior to his procedure Patient is seen by Dr. Leighton Ruff. Patient's Radiofrequency Ablation surgery was done by Dr. Marlaine Hind. Patient does not smoke or drink ETOH. Patient is a retired Physiological scientist. Patient states he has a history of angina that was treating by putting him on  cholesterol medications. He states his blockages were not significant enough to require stenting.  PCP Dr Edwin Dada Cardiologist Dr Marlou Porch  Appt Jan 12 Pain management Dr Brien Few   Past Medical History  Diagnosis Date  . Gout   . Hypercholesteremia   . Pulmonary embolism   . Hypertension   . Migraine    Past Surgical History  Procedure Laterality Date  . Appendectomy    . Nasal septum surgery    . Tonsillectomy    . Testicle surgery    . Cardiac catheterization     Family History  Problem Relation Age of Onset  . Hypertension Mother   . Hypertension Father    History  Substance Use Topics  . Smoking status: Former Smoker    Quit date: 05/24/1983  . Smokeless tobacco: Not on file  . Alcohol Use: No   Lives at home Lives with spouse retired  Review of Systems  Constitutional: Negative for fever and chills.  HENT: Negative for congestion, rhinorrhea and sore throat.   Respiratory: Positive for shortness of breath. Negative for cough.   Cardiovascular: Positive for chest pain.  Gastrointestinal: Negative for nausea, vomiting and diarrhea.  Musculoskeletal: Positive for back pain.  Psychiatric/Behavioral: Negative for confusion.  All other systems reviewed and are negative.  Allergies  Penicillins and Amlodipine  Home Medications   Prior to Admission medications   Medication Sig Start Date End Date Taking? Authorizing Provider  acetaminophen (TYLENOL) 325 MG tablet Take 650 mg by mouth every  6 (six) hours as needed for mild pain.    Yes Historical Provider, MD  allopurinol (ZYLOPRIM) 300 MG tablet Take 300 mg by mouth daily.   Yes Historical Provider, MD  finasteride (PROSCAR) 5 MG tablet Take 5 mg by mouth daily.   Yes Historical Provider, MD  fluticasone (FLONASE) 50 MCG/ACT nasal spray Place 1 spray into both nostrils daily as needed for allergies.    Yes Historical Provider, MD  gabapentin (NEURONTIN) 400 MG capsule Take 800 mg by mouth 2 (two) times daily.    Yes Historical Provider, MD  glipiZIDE (GLUCOTROL) 5 MG tablet Take 5 mg by mouth daily before breakfast.   Yes Historical Provider, MD  hydrochlorothiazide (MICROZIDE) 12.5 MG capsule Take 12.5 mg by mouth daily.   Yes Historical Provider, MD  ketoconazole (NIZORAL) 2 % cream Apply 1 application topically daily as needed for irritation (feet).  10/07/13  Yes Historical Provider, MD  loratadine (CLARITIN) 10 MG tablet Take 10 mg by mouth daily.   Yes Historical Provider, MD  losartan (COZAAR) 100 MG tablet Take 100 mg by mouth daily.   Yes Historical Provider, MD  potassium chloride SA (K-DUR,KLOR-CON) 20 MEQ tablet Take 1 tab daily 09/06/13  Yes Historical Provider, MD  propranolol (INDERAL) 60 MG tablet Take 60 mg by mouth 2 (two) times daily.   Yes Historical Provider, MD  rivaroxaban (XARELTO) 20 MG TABS tablet Take 20 mg by mouth every morning.   Yes Historical Provider, MD  traMADol (ULTRAM) 50 MG tablet Take 50 mg by mouth every 6 (six) hours as needed for moderate pain.   Yes Historical Provider, MD  atorvastatin (LIPITOR) 40 MG tablet Take 1 tablet (40 mg total) by mouth daily. Patient not taking: Reported on 05/17/2014 09/27/13   Candee Furbish, MD  rivaroxaban (XARELTO) 10 MG TABS tablet Take 20 mg by mouth daily. 11/07/11   Historical Provider, MD   Triage Vitals: BP 171/93 mmHg  Pulse 68  Temp(Src) 97.9 F (36.6 C) (Oral)  Resp 23  Ht 5\' 11"  (1.803 m)  Wt 255 lb (115.667 kg)  BMI 35.58 kg/m2  SpO2 95%  Vital signs normal except for hypertension   Physical Exam  Constitutional: He is oriented to person, place, and time. He appears well-developed and well-nourished.  Non-toxic appearance. He does not appear ill. No distress.  HENT:  Head: Normocephalic and atraumatic.  Right Ear: External ear normal.  Left Ear: External ear normal.  Nose: Nose normal. No mucosal edema or rhinorrhea.  Mouth/Throat: Oropharynx is clear and moist and mucous membranes are normal. No dental abscesses  or uvula swelling.  Eyes: Conjunctivae and EOM are normal. Pupils are equal, round, and reactive to light.  Neck: Normal range of motion and full passive range of motion without pain. Neck supple.  Cardiovascular: Normal rate, regular rhythm and normal heart sounds.  Exam reveals no gallop and no friction rub.   No murmur heard. Pulmonary/Chest: Effort normal and breath sounds normal. No respiratory distress. He has no wheezes. He has no rhonchi. He has no rales. He exhibits no tenderness and no crepitus.    Area of pain noted  Abdominal: Soft. Normal appearance and bowel sounds are normal. He exhibits no distension. There is no tenderness. There is no rebound and no guarding.  Musculoskeletal: Normal range of motion. He exhibits tenderness. He exhibits no edema.       Legs: Moves all extremities well. Nontender calves, nontender medial thighs, but tender posterior thighs.  Neurological: He  is alert and oriented to person, place, and time. He has normal strength. No cranial nerve deficit.  Skin: Skin is warm, dry and intact. No rash noted. No erythema. No pallor.  Psychiatric: He has a normal mood and affect. His speech is normal and behavior is normal. His mood appears not anxious.  Nursing note and vitals reviewed.  ED Course  Procedures (including critical care time)  Medications  0.9 %  sodium chloride infusion ( Intravenous Stopped 05/17/14 0614)  morphine 4 MG/ML injection 4 mg (4 mg Intravenous Given 05/17/14 0415)  ondansetron (ZOFRAN) injection 4 mg (4 mg Intravenous Given 05/17/14 0415)  iohexol (OMNIPAQUE) 350 MG/ML injection 100 mL (100 mLs Intravenous Contrast Given 05/17/14 0419)    DIAGNOSTIC STUDIES: Oxygen Saturation is 95% on RA, adequate by my interpretation.    COORDINATION OF CARE: 3:07 AM- Discussed plans to obtain diagnostic blood work. Will order CT imaging of chest. Pt advised of plan for treatment and pt agrees.  Patient was given results of his CT scan. He  states his chest pain is much improved and almost gone. His CK is normal so I do not suspect myositis. Patient was discharged on low-dose nonsteroidal anti-inflammatory history due to him being on a anticoagulant. He was only given 5 days worth of medication.  Results for orders placed or performed during the hospital encounter of 05/17/14  CBC  Result Value Ref Range   WBC 7.2 4.0 - 10.5 K/uL   RBC 4.97 4.22 - 5.81 MIL/uL   Hemoglobin 14.4 13.0 - 17.0 g/dL   HCT 42.6 39.0 - 52.0 %   MCV 85.7 78.0 - 100.0 fL   MCH 29.0 26.0 - 34.0 pg   MCHC 33.8 30.0 - 36.0 g/dL   RDW 14.6 11.5 - 15.5 %   Platelets 203 150 - 400 K/uL  Basic metabolic panel  Result Value Ref Range   Sodium 137 135 - 145 mmol/L   Potassium 3.3 (L) 3.5 - 5.1 mmol/L   Chloride 102 96 - 112 mEq/L   CO2 23 19 - 32 mmol/L   Glucose, Bld 185 (H) 70 - 99 mg/dL   BUN 15 6 - 23 mg/dL   Creatinine, Ser 1.36 (H) 0.50 - 1.35 mg/dL   Calcium 8.9 8.4 - 10.5 mg/dL   GFR calc non Af Amer 55 (L) >90 mL/min   GFR calc Af Amer 63 (L) >90 mL/min   Anion gap 12 5 - 15  BNP (order ONLY if patient complains of dyspnea/SOB AND you have documented it for THIS visit)  Result Value Ref Range   B Natriuretic Peptide 33.8 0.0 - 100.0 pg/mL  Protime-INR (if pt is taking Coumadin)  Result Value Ref Range   Prothrombin Time 17.4 (H) 11.6 - 15.2 seconds   INR 1.41 0.00 - 1.49  I-stat troponin, ED (not at [facility])  Result Value Ref Range   Troponin i, poc 0.01 0.00 - 0.08 ng/mL   Comment 3           Laboratory interpretation all normal except mild renal insufficiency     EKG Interpretation  Date/Time:  Friday May 16 2014 23:43:42 EST Ventricular Rate:  68 PR Interval:  164 QRS Duration: 100 QT Interval:  368 QTC Calculation: 391 R Axis:   -58 Text Interpretation:  Normal sinus rhythm Pulmonary disease pattern Left anterior fascicular block Nonspecific T wave abnormality No significant change since last tracing 03 Sep 2013 Confirmed  by [facility]  MD-I, Laurrie Toppin ([id number]) on  05/17/2014 1:27:06 AM        MDM   Final diagnoses:  Pleuritic chest pain  History of pulmonary embolism    Discharge Medication List as of 05/17/2014  6:25 AM    START taking these medications   Details  cyclobenzaprine (FLEXERIL) 10 MG tablet Take 1 tablet (10 mg total) by mouth 3 (three) times daily as needed (muscle soreness)., Starting 05/17/2014, Until Discontinued, Print    naproxen (NAPROSYN) 250 MG tablet Take 1 tablet (250 mg total) by mouth 2 (two) times daily., Starting 05/17/2014, Until Discontinued, Print        Plan discharge  Rolland Porter, MD, FACEP  I personally performed the services described in this documentation, which was scribed in my presence. The recorded information has been reviewed and considered.  Rolland Porter, MD, Abram Sander   Janice Norrie, MD 05/17/14 [phone]

## 2014-05-17 NOTE — Discharge Instructions (Signed)
Take the medication as prescribed. Recheck if you get worse such as fever, struggle to breathe. Keep your appointment with Dr Marlou Porch on Jan 12th.

## 2014-05-27 ENCOUNTER — Telehealth: Payer: Self-pay | Admitting: Cardiology

## 2014-05-27 NOTE — Telephone Encounter (Signed)
Pt calling because he was seen in the ED for chest pain that was determined to be pleuritic.  He is asking for a recommendation to a pulmonologist.  Advised we refer pts to [facility] on [facility]  He states understanding and that he will call them to schedule an appt.

## 2014-05-27 NOTE — Telephone Encounter (Signed)
New message     Pt was seen in ER last week.  He was having chest pain.  He was released.  He is not having chest pain but want to talk to a nurse

## 2014-06-03 ENCOUNTER — Ambulatory Visit (INDEPENDENT_AMBULATORY_CARE_PROVIDER_SITE_OTHER): Payer: Non-veteran care | Admitting: Cardiology

## 2014-06-03 ENCOUNTER — Encounter: Payer: Self-pay | Admitting: Cardiology

## 2014-06-03 VITALS — BP 126/82 | HR 73 | Ht 71.0 in | Wt 268.0 lb

## 2014-06-03 DIAGNOSIS — R0789 Other chest pain: Secondary | ICD-10-CM | POA: Diagnosis not present

## 2014-06-03 DIAGNOSIS — I10 Essential (primary) hypertension: Secondary | ICD-10-CM

## 2014-06-03 DIAGNOSIS — I2699 Other pulmonary embolism without acute cor pulmonale: Secondary | ICD-10-CM | POA: Diagnosis not present

## 2014-06-03 NOTE — Progress Notes (Signed)
[place]. [address], [address], [place]  [address] Phone: [phone] Fax:  [phone]  Date:  06/03/2014   ID:  [name] Whitaker, DOB Jul 22, 1950, MRN [id number]  PCP:  Gerrit Heck, MD   History of Present Illness: [name] Whitaker is a 64 y.o. male here for follow up abnormal EKG, having been seen in emergency room on 09/03/13 and on 05/17/14. He has a history of DVT and PE and presented with increased leg swelling at the emergency department. Left leg was more edematous than right. Has had 2 prior thrombotic episodes. Left home at 1am to get evaluated. 09/03/13 and on 05/17/14: PE CT negative.  Also went to [facility] ER 05/21/14. Gave Flexeril and pain medication. Pleurisy. Right of sternum, like hit with a hammer. Felt more in back pushing forward. NTG.   Burning in his anterior chest, 7/10 without dyspnea. His EKG showed diffuse T wave inversion. Has incomplete right bundle branch block and left anterior fascicular block. Troponin was normal at 0.30. BNP was 50. CT scan 4.4 cm ascending root. No evidence of acute DVT on ultrasound.  Had cardiac cath at [facility]. 2014. Was told that he had moderate disease, 50%, no PCI.  He is transitioning over his primary care to Dr. Leighton Ruff.  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 Trying to lose weight.    Wt Readings from Last 3 Encounters:  06/03/14 268 lb (121.564 kg)  05/16/14 255 lb (115.667 kg)  11/27/13 254 lb (115.214 kg)     Past Medical History  Diagnosis Date  . Gout   . Hypercholesteremia   . Pulmonary embolism   . Hypertension   . Migraine     Past Surgical History  Procedure Laterality Date  . Appendectomy    . Nasal septum surgery    . Tonsillectomy    . Testicle surgery    . Cardiac catheterization      Current Outpatient Prescriptions  Medication Sig Dispense Refill  . acetaminophen (TYLENOL) 325 MG tablet Take 650 mg by mouth every 6 (six) hours as needed for mild pain.       Marland Kitchen allopurinol (ZYLOPRIM) 300 MG tablet Take 300 mg by mouth daily.    Marland Kitchen atorvastatin (LIPITOR) 40 MG tablet Take 1 tablet (40 mg total) by mouth daily. 30 tablet 3  . cyclobenzaprine (FLEXERIL) 10 MG tablet Take 1 tablet (10 mg total) by mouth 3 (three) times daily as needed (muscle soreness). 30 tablet 0  . finasteride (PROSCAR) 5 MG tablet Take 5 mg by mouth daily.    . fluticasone (FLONASE) 50 MCG/ACT nasal spray Place 1 spray into both nostrils daily as needed for allergies.     Marland Kitchen gabapentin (NEURONTIN) 400 MG capsule Take 800 mg by mouth 2 (two) times daily.    Marland Kitchen glipiZIDE (GLUCOTROL) 5 MG tablet Take 5 mg by mouth daily before breakfast.    . hydrochlorothiazide (MICROZIDE) 12.5 MG capsule Take 12.5 mg by mouth daily.    Marland Kitchen ketoconazole (NIZORAL) 2 % cream Apply 1 application topically daily as needed for irritation (feet).     Marland Kitchen loratadine (CLARITIN) 10 MG tablet Take 10 mg by mouth daily.    Marland Kitchen losartan (COZAAR) 100 MG tablet Take 100 mg by mouth daily.    . potassium chloride SA (K-DUR,KLOR-CON) 20 MEQ tablet Take 1 tab daily    . propranolol (INDERAL) 60 MG tablet Take 60 mg by mouth 2 (two) times daily.    Marland Kitchen  rivaroxaban (XARELTO) 10 MG TABS tablet Take 20 mg by mouth daily.    . rivaroxaban (XARELTO) 20 MG TABS tablet Take 20 mg by mouth every morning.    . traMADol (ULTRAM) 50 MG tablet Take 50 mg by mouth every 6 (six) hours as needed for moderate pain.     No current facility-administered medications for this visit.    Allergies:    Allergies  Allergen Reactions  . Penicillins Shortness Of Breath  . Amlodipine Swelling    Social History:  The patient  reports that he quit smoking about 31 years ago. He does not have any smokeless tobacco history on file. He reports that he does not drink alcohol or use illicit drugs.   Family History  Problem Relation Age of Onset  . Hypertension Mother   . Hypertension Father     ROS:  Please see the history of present illness.    Denies any fevers, chills, orthopnea, PND, syncope.   All other systems reviewed and negative.   PHYSICAL EXAM: VS:  BP 126/82 mmHg  Pulse 73  Ht 5\' 11"  (1.803 m)  Wt 268 lb (121.564 kg)  BMI 37.39 kg/m2 Well nourished, well developed, in no acute distress HEENT: normal, [place]/AT, EOMI Neck: no JVD, normal carotid upstroke, no bruit Cardiac:  normal S1, S2; RRR; no murmur Lungs:  clear to auscultation bilaterally, no wheezing, rhonchi or rales Abd: soft, nontender, no hepatomegaly, no bruitsoverweight Ext: trace B edema, 2+ distal pulses Skin: warm and dry GU: deferred Neuro: no focal abnormalities noted, AAO x 3  EKG:  Sinus rhythm, incomplete right bundle branch block, left anterior fascicular block, diffuse T wave inversion.  Cardiac catheterization 07/14/2011-[facility]   -distal left main 10%, mid LAD 50%, circumflex nondominant, minor luminal irregularities. RCA normal. Left ventriculogram mildly depressed.  Right atrium 5, right ventricle 16/2, wedge 89mmHg.  ASSESSMENT AND PLAN:  1. Pleurisy/chronic chest pain - still with chest pain and back pain that moves.  Intermittent. Upper back and chest. No cough. Has been given a trial of prednisone. Several emergency room visits. Reassuring workups. On gabapentin. Asked to take Tylenol. Exercise. No signs of cardiac abnormality. Noncardiac chest pain. 2. Abnormal EKG - T-wave inversion. Catheterization at [name] which showed moderate CAD, up to 50% but no PCI needed. He has been treated medically. He continues to be on beta blocker and ACE inhibitor. He has not had any further chest discomfort with exertion. We will continue with current regimen. I will see him back in 6 months to followup symptoms. 3. Hyperlipidemia-prior statin intolerances. Encourage atorvastatin use. Dr. Drema Dallas recently check lipids. 4. CAD-moderate disease. 50% LAD, Medical management. Catheterization 07/14/11.  5. 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 History of PE. Because of Xarelto, cannot take NSAIDs. 6. Hypertension-multidrug regimen. Doing well. 7. 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 We will continue to monitor 8. 6 mths follow up.   Signed, Candee Furbish, MD [facility]  06/03/2014 4:58 PM

## 2014-06-03 NOTE — Patient Instructions (Signed)
The current medical regimen is effective;  continue present plan and medications.  Follow up in 6 months with Dr. Skains.  You will receive a letter in the mail 2 months before you are due.  Please call us when you receive this letter to schedule your follow up appointment.  Thank you for choosing [facility]!!     

## 2014-06-04 ENCOUNTER — Encounter: Payer: Self-pay | Admitting: Cardiology

## 2014-06-06 ENCOUNTER — Encounter: Payer: Self-pay | Admitting: Cardiology

## 2014-06-13 ENCOUNTER — Other Ambulatory Visit (HOSPITAL_COMMUNITY): Payer: BC Managed Care – PPO

## 2014-06-16 ENCOUNTER — Other Ambulatory Visit (HOSPITAL_COMMUNITY): Payer: BC Managed Care – PPO

## 2014-06-16 ENCOUNTER — Other Ambulatory Visit (HOSPITAL_COMMUNITY): Payer: Self-pay | Admitting: Cardiology

## 2014-06-16 ENCOUNTER — Ambulatory Visit (HOSPITAL_COMMUNITY): Payer: BC Managed Care – PPO | Attending: Cardiology | Admitting: Radiology

## 2014-06-16 DIAGNOSIS — I712 Thoracic aortic aneurysm, without rupture: Secondary | ICD-10-CM

## 2014-06-16 DIAGNOSIS — I7121 Aneurysm of the ascending aorta, without rupture: Secondary | ICD-10-CM

## 2014-06-16 NOTE — Progress Notes (Signed)
Echocardiogram performed.  

## 2014-06-27 ENCOUNTER — Encounter: Payer: Self-pay | Admitting: Internal Medicine

## 2014-06-27 ENCOUNTER — Ambulatory Visit (INDEPENDENT_AMBULATORY_CARE_PROVIDER_SITE_OTHER): Payer: Non-veteran care | Admitting: Internal Medicine

## 2014-06-27 ENCOUNTER — Encounter (INDEPENDENT_AMBULATORY_CARE_PROVIDER_SITE_OTHER): Payer: Self-pay

## 2014-06-27 ENCOUNTER — Telehealth: Payer: Self-pay | Admitting: Internal Medicine

## 2014-06-27 VITALS — BP 128/90 | HR 63 | Ht 71.0 in | Wt 267.0 lb

## 2014-06-27 DIAGNOSIS — R0600 Dyspnea, unspecified: Secondary | ICD-10-CM

## 2014-06-27 DIAGNOSIS — I2699 Other pulmonary embolism without acute cor pulmonale: Secondary | ICD-10-CM | POA: Diagnosis not present

## 2014-06-27 HISTORY — DX: Dyspnea, unspecified: R06.00

## 2014-06-27 MED ORDER — FAMOTIDINE 20 MG PO TABS: ORAL_TABLET | ORAL | Status: AC

## 2014-06-27 MED ORDER — PANTOPRAZOLE SODIUM 40 MG PO TBEC: 40.0000 mg | DELAYED_RELEASE_TABLET | Freq: Every day | ORAL | Status: DC

## 2014-06-27 NOTE — Progress Notes (Signed)
Subjective:    Patient ID: [name] Whitaker, male    DOB: 09/20/1950   MRN: [id number]  HPI  13 yobm quit smoking 1985 with recurrent PE around 2013  p L ankle injury  But back to baseline on maintenance then abruptly developed back and chest pain/sob 05/16/14 > [facility] > referred by [facility] to pulmonary clinic 06/27/2014 p neg cardiac w/u by Dr Marlou Porch   06/27/2014 1st [facility] office visit/ Wert   Chief Complaint  Patient presents with  . Pulmonary Consult    Referred by [facility]. Pt c/o SOB and CP on and off since 05/16/14. He states that his SOB can occur at rest or with exertion.  He states that the CP is dull and does can occur with rest or exertion. He also c/o sharp pain between in his shoulders.   onset of cp at rest   center of chest s radiation lasted weeks, then resolved x 3 weeks then recurred off and on x  shortest few hours, longest few hours, happening now 2 x weekly, sitting up more often than supine. Not lateralizing or pleuritic, can't related to meals, has not tried antacids/ppi  Sob with voice use more than exertion/ "other people hear me breathing hard"   No obvious other patterns in day to day or daytime variabilty or assoc chronic cough or  chest tightness, subjective wheeze overt sinus or hb symptoms. No unusual exp hx or h/o childhood pna/ asthma or knowledge of premature birth.  Sleeping ok without nocturnal  or early am exacerbation  of respiratory  c/o's or need for noct saba. Also denies any obvious fluctuation of symptoms with weather or environmental changes or other aggravating or alleviating factors except as outlined above   Current Medications, Allergies, Complete Past Medical History, Past Surgical History, Family History, and Social History were reviewed in [employer] record.             Review of Systems  Constitutional: Negative for fever, chills, activity change, appetite change and unexpected weight change.  HENT: Negative  for congestion, dental problem, postnasal drip, rhinorrhea, sneezing, sore throat, trouble swallowing and voice change.   Eyes: Negative for visual disturbance.  Respiratory: Positive for shortness of breath. Negative for cough and choking.   Cardiovascular: Positive for chest pain. Negative for leg swelling.  Gastrointestinal: Negative for nausea, vomiting and abdominal pain.  Genitourinary: Negative for difficulty urinating.  Musculoskeletal: Negative for arthralgias.  Skin: Negative for rash.  Psychiatric/Behavioral: Negative for behavioral problems and confusion.       Objective:   Physical Exam  Wt Readings from Last 3 Encounters:  06/27/14 267 lb (121.11 kg)  06/03/14 268 lb (121.564 kg)  05/16/14 255 lb (115.667 kg)    Vital signs reviewed    HEENT: nl dentition, turbinates, and orophanx. Nl external ear canals without cough reflex   NECK :  without JVD/Nodes/TM/ nl carotid upstrokes bilaterally   LUNGS: no acc muscle use, clear to A and P bilaterally without cough on insp or exp maneuvers   CV:  RRR  no s3 or murmur or increase in P2, no edema   ABD:  soft and nontender with nl excursion in the supine position. No bruits or organomegaly, bowel sounds nl  MS:  warm without deformities, calf tenderness, cyanosis or clubbing  SKIN: warm and dry without lesions    NEURO:  alert, approp, no deficits     CTa chest 05/17/14  I personally reviewed images  and agree with radiology impression as follows:   No acute pulmonary embolism. Enlarged main pulmonary artery can be seen with chronic pulmonary arterial hypertension.  Mild cardiomegaly and pulmonary vascular congestion.  Fusiform ascending aortic aneurysm.          Assessment & Plan:

## 2014-06-27 NOTE — Telephone Encounter (Signed)
Spoke with pt, had concerns about the GERD diet MW put on his AVS- I clarified the recs.  Nothing further needed.

## 2014-06-27 NOTE — Patient Instructions (Signed)
Pantoprazole (protonix) 40 mg   Take 30-60 min before first meal of the day and Pepcid 20 mg one bedtime until return to office - this is the best way to tell whether stomach acid is contributing to your problem.    GERD (REFLUX)  is an extremely common cause of respiratory symptoms just like yours , many times with no obvious heartburn at all.    It can be treated with medication, but also with lifestyle changes including avoidance of late meals, excessive alcohol, smoking cessation, and avoid fatty foods, chocolate, peppermint, colas, red wine, and acidic juices such as orange juice.  NO MINT OR MENTHOL PRODUCTS SO NO COUGH DROPS  USE SUGARLESS CANDY INSTEAD (Jolley ranchers or Stover's or Life Savers) or even ice chips will also do - the key is to swallow to prevent all throat clearing. NO OIL BASED VITAMINS - use powdered substitutes.    Please schedule a follow up office visit in 6 weeks, call sooner if needed with pfts

## 2014-06-29 ENCOUNTER — Encounter: Payer: Self-pay | Admitting: Internal Medicine

## 2014-06-29 NOTE — Assessment & Plan Note (Addendum)
2/5 /2016  Walked RA x 3 laps @ 185 ft each stopped due to  End of study, slow pace min sob    Symptoms are markedly disproportionate to objective findings and not clear this is a lung problem but pt does appear to have difficult airway management issues. DDX of  difficult airways management all start with A and  include Adherence, Ace Inhibitors, Acid Reflux, Active Sinus Disease, Alpha 1 Antitripsin deficiency, Anxiety masquerading as Airways dz,  ABPA,  allergy(esp in young), Aspiration (esp in elderly), Adverse effects of DPI,  Active smokers, plus two Bs  = Bronchiectasis and Beta blocker use..and one C= CHF  Adherence is always the initial "prime suspect" and is a multilayered concern that requires a "trust but verify" approach in every patient - starting with knowing how to use medications, especially inhalers, correctly, keeping up with refills and understanding the fundamental difference between maintenance and prns vs those medications only taken for a very short course and then stopped and not refilled.   ? Acid (or non-acid) GERD > always difficult to exclude as up to 75% of pts in some series report no assoc GI/ Heartburn symptoms> rec max (24h)  acid suppression and diet restrictions/ reviewed and instructions given in writing.   ? Anxiety > usually dx of exclusion   ?BB effect > need alternative to inderal if proves to have significant airflow obstruction > Strongly prefer in this setting: Bystolic, the most beta -1  selective Beta blocker available in sample form, with bisoprolol the most selective generic choice  on the market.   ? chf > doubt strongly/ eg w/u by Dr Kingsley Plan  Needs to return for pfts p full trial of gerd rx

## 2014-06-29 NOTE — Assessment & Plan Note (Signed)
Agree with lifelong xarelto, no evidence of recurrence and note cp is midline and not really pleuratic or lateralizing and much more likely GERD related so I would not keep pursuing dx of recurrent pe while on rx unless hx changes dramatically

## 2014-08-01 ENCOUNTER — Ambulatory Visit (INDEPENDENT_AMBULATORY_CARE_PROVIDER_SITE_OTHER): Payer: Non-veteran care | Admitting: Internal Medicine

## 2014-08-01 ENCOUNTER — Encounter: Payer: Self-pay | Admitting: Internal Medicine

## 2014-08-01 VITALS — BP 140/94 | HR 56 | Ht 71.0 in | Wt 266.0 lb

## 2014-08-01 DIAGNOSIS — M79661 Pain in right lower leg: Secondary | ICD-10-CM | POA: Diagnosis not present

## 2014-08-01 DIAGNOSIS — R0600 Dyspnea, unspecified: Secondary | ICD-10-CM | POA: Diagnosis not present

## 2014-08-01 DIAGNOSIS — M79662 Pain in left lower leg: Secondary | ICD-10-CM

## 2014-08-01 LAB — PULMONARY FUNCTION TEST
DL/VA % PRED: 77 %
DL/VA: 3.61 ml/min/mmHg/L
DLCO unc % pred: 119 %
DLCO unc: 40.27 ml/min/mmHg
FEF 25-75 Post: 2.87 L/sec
FEF 25-75 Pre: 2.87 L/sec
FEF2575-%CHANGE-POST: 0 %
FEF2575-%Pred-Post: 97 %
FEF2575-%Pred-Pre: 98 %
FEV1-%Change-Post: 0 %
FEV1-%PRED-POST: 90 %
FEV1-%PRED-PRE: 90 %
FEV1-PRE: 2.9 L
FEV1-Post: 2.89 L
FEV1FVC-%Change-Post: 0 %
FEV1FVC-%PRED-PRE: 102 %
FEV6-%Change-Post: -1 %
FEV6-%Pred-Post: 89 %
FEV6-%Pred-Pre: 91 %
FEV6-POST: 3.57 L
FEV6-Pre: 3.62 L
FEV6FVC-%Pred-Post: 104 %
FEV6FVC-%Pred-Pre: 104 %
FVC-%Change-Post: -1 %
FVC-%PRED-PRE: 87 %
FVC-%Pred-Post: 86 %
FVC-POST: 3.57 L
FVC-Pre: 3.62 L
POST FEV1/FVC RATIO: 81 %
POST FEV6/FVC RATIO: 100 %
PRE FEV6/FVC RATIO: 100 %
Pre FEV1/FVC ratio: 80 %
RV % PRED: 73 %
RV: 1.72 L
TLC % PRED: 79 %
TLC: 5.72 L

## 2014-08-01 NOTE — Progress Notes (Signed)
PFT performed today. 

## 2014-08-01 NOTE — Progress Notes (Signed)
Subjective:    Patient ID: [name] Whitaker, male    DOB: 12-Jan-1951   MRN: [id number]    Brief patient profile:  63 yobm quit smoking 1985 with recurrent PE around 2013  p L ankle injury  But back to baseline on maintenance then abruptly developed back and chest pain/sob 05/16/14 > [facility] > referred by [facility] to pulmonary clinic 06/27/2014 p neg cardiac w/u by Dr Marlou Porch    History of Present Illness  06/27/2014 1st [facility] office visit/ Almee Pelphrey   Chief Complaint  Patient presents with  . Pulmonary Consult    Referred by [facility]. Pt c/o SOB and CP on and off since 05/16/14. He states that his SOB can occur at rest or with exertion.  He states that the CP is dull and does can occur with rest or exertion. He also c/o sharp pain between in his shoulders.   onset of cp at rest  center of chest s radiation lasted weeks, then resolved x 3 weeks then recurred off and on x  shortest few hours, longest few hours, happening now 2 x weekly, sitting up more often than supine. Not lateralizing or pleuritic, can't related to meals, has not tried antacids/ppi  Sob with voice use more than exertion/ "other people hear me breathing hard"  rec Pantoprazole (protonix) 40 mg   Take 30-60 min before first meal of the day and Pepcid 20 mg one bedtime until return to office - this is the best way to tell whether stomach acid is contributing to your problem.   GERD diet       08/01/2014 f/u ov/Nixon Sparr re: unexplained sob  Chief Complaint  Patient presents with  . Follow-up    PFT done today. Pt states that his CP has resolved. His breathing is unchanged.   ex tolerance :  Limited by bilateral leg cramps  x several  Years on variable amts of statins, doesn't happen when he's not taking statins  Sob most noticeable with voice use phone or giving a talk / min assoc dry cough   Sleeps on cpap ok   No obvious day to day or daytime variabilty or assoc cp or chest tightness, subjective wheeze overt sinus or hb  symptoms. No unusual exp hx or h/o childhood pna/ asthma or knowledge of premature birth.  Sleeping ok without nocturnal  or early am exacerbation  of respiratory  c/o's or need for noct saba. Also denies any obvious fluctuation of symptoms with weather or environmental changes or other aggravating or alleviating factors except as outlined above   Current Medications, Allergies, Complete Past Medical History, Past Surgical History, Family History, and Social History were reviewed in [employer] record.  ROS  The following are not active complaints unless bolded sore throat, dysphagia, dental problems, itching, sneezing,  nasal congestion or excess/ purulent secretions, ear ache,   fever, chills, sweats, unintended wt loss, pleuritic or exertional cp, hemoptysis,  orthopnea pnd or leg swelling, presyncope, palpitations, heartburn, abdominal pain, anorexia, nausea, vomiting, diarrhea  or change in bowel or urinary habits, change in stools or urine, dysuria,hematuria,  rash, arthralgias, visual complaints, headache, numbness weakness or ataxia or problems with walking or coordination,  change in mood/affect or memory.                           Objective:   Physical Exam  08/01/2014        266  Wt  Readings from Last 3 Encounters:  06/27/14 267 lb (121.11 kg)  06/03/14 268 lb (121.564 kg)  05/16/14 255 lb (115.667 kg)    Vital signs reviewed    HEENT: nl dentition, turbinates, and orophanx. Nl external ear canals without cough reflex   NECK :  without JVD/Nodes/TM/ nl carotid upstrokes bilaterally   LUNGS: no acc muscle use, clear to A and P bilaterally without cough on insp or exp maneuvers   CV:  RRR  no s3 or murmur or increase in P2, no edema   ABD:  soft and nontender with nl excursion in the supine position. No bruits or organomegaly, bowel sounds nl  MS:  warm without deformities, calf tenderness, cyanosis or clubbing  SKIN: warm and dry without  lesions    NEURO:  alert, approp, no deficits     CTa chest 05/17/14  I personally reviewed images and agree with radiology impression as follows:   No acute pulmonary embolism. Enlarged main pulmonary artery can be seen with chronic pulmonary arterial hypertension. Mild cardiomegaly and pulmonary vascular congestion. Fusiform ascending aortic aneurysm.          Assessment & Plan:

## 2014-08-01 NOTE — Patient Instructions (Signed)
Try coQ enzyme over the counter to see helps the muscle  cramps and discuss with Drs Drema Dallas / Luther Parody (he is also following your aneurysm)  Weight control is simply a matter of calorie balance which needs to be tilted in your favor by eating less and exercising more.  To get the most out of exercise, you need to be continuously aware that you are short of breath, but never out of breath, for 30 minutes daily. As you improve, it will actually be easier for you to do the same amount of exercise  in  30 minutes so always push to the level where you are short of breath.  If this does not result in gradual weight reduction then I strongly recommend you see a nutritionist with a food diary x 2 weeks so that we can work out a negative calorie balance which is universally effective in steady weight loss programs.  Think of your calorie balance like you do your bank account where in this case you want the balance to go down so you must take in less calories than you burn up.  It's just that simple:  Hard to do, but easy to understand.  Good luck!   Pulmonary follow up is as needed

## 2014-08-02 ENCOUNTER — Encounter: Payer: Self-pay | Admitting: Internal Medicine

## 2014-08-02 DIAGNOSIS — M79662 Pain in left lower leg: Secondary | ICD-10-CM

## 2014-08-02 DIAGNOSIS — M79661 Pain in right lower leg: Secondary | ICD-10-CM | POA: Insufficient documentation

## 2014-08-02 NOTE — Assessment & Plan Note (Signed)
Reported to resolve off statins>   rec trial of coQ10/ discuss with cards and primary care if not better

## 2014-08-02 NOTE — Assessment & Plan Note (Addendum)
2/5 /2016  Walked RA x 3 laps @ 185 ft each stopped due to  End of study, slow pace min sob  - PFTs 08/01/14  FEV1  2.89 (90%) ratio 81 and nl dlco    Symptoms remain markedly disproportionate to objective findings and not clear this is a lung problem but pt does appear to have difficult airway management issues. DDX of  difficult airways management all start with A and  include Adherence, Ace Inhibitors, Acid Reflux, Active Sinus Disease, Alpha 1 Antitripsin deficiency, Anxiety masquerading as Airways dz,  ABPA,  allergy(esp in young), Aspiration (esp in elderly), Adverse effects of DPI,  Active smokers, plus two Bs  = Bronchiectasis and Beta blocker use..and one C= CHF  ? Anxiety > dx of exclusion   ? Acid (or non-acid) GERD > always difficult to exclude as up to 75% of pts in some series report no assoc GI/ Heartburn symptoms> rec continue max (24h)  acid suppression and diet restrictions/ reviewed       The CP as resolved on Acid rx but of course GERD is more than an excess acid issue and will need to continue to be addressed through diet/ wt loss  Nothing else to offer in pulmonary clinic as his spells of sob only when talking not c/w asthma and much more typical of voice fatigue from gerd.  If dx remains in doubt we could do a cpst but he'd need to be off statins for 2 weeks to assure his calf pains don't limit his from achieving V02 max

## 2014-09-14 NOTE — Discharge Summary (Signed)
PATIENT NAMEARIEZ, Whitaker MR#:  [id number] DATE OF BIRTH:  Feb 16, 1951  DATE OF ADMISSION:  11/17/2011 DATE OF DISCHARGE:  11/18/2011  DISCHARGE DIAGNOSES: 1. Rectal bleeding, likely due to internal hemorrhoids, on aspirin and Xarelto.  2. Chest pain, likely secondary to anxiety. Negative serial cardiac enzymes.   SECONDARY DIAGNOSES:  1. Hypertension with a history of pulmonary emboli and deep vein thrombosis.  2. PPH.  3. Hyperlipidemia.  4. Migraine.  5. Gout.   CONSULTATION: Gastroenterology, Dr. Dionne Milo.   PROCEDURES/RADIOLOGY: Chest x-ray on the 27th of June showed no acute cardiopulmonary disease.   HISTORY AND SHORT HOSPITAL COURSE: The patient is a 64 year old male with the above-mentioned medical problems who was admitted for rectal bleed, had stable hemodynamics. Gastroenterology consultation was obtained with Dr. Dionne Milo who felt this to be due to internal hemorrhoids and being on anticoagulation with Xarelto and aspirin. The patient did not have any further bleed. He was also found to have chest pain on admission, which was ruled out with three negative sets of cardiac enzymes. His chest pain was also resolved while in the hospital. The patient hemodynamically remained stable and did not have any further rectal bleed and was discharged home in stable condition.   PERTINENT PHYSICAL EXAMINATION ON THE DATE OF DISCHARGE: VITAL SIGNS: On the date of discharge, his vital signs were as follows: Temperature 98.7, heart rate 65 per minute, respirations 18 per minute, blood pressure 133/89. He was saturating 97% on room air. CARDIOVASCULAR: S1, S2 normal. No murmurs, rubs, or gallop. LUNGS: Clear to auscultation bilaterally. No wheezing, rales, rhonchi, or crepitation. ABDOMEN: Soft, benign. NEUROLOGIC: Nonfocal examination. All other physical examination remained at baseline.   DISCHARGE MEDICATIONS:  1. Finasteride 5 mg p.o. daily. 2. Allopurinol 300 mg p.o. daily.   3. Hydrochlorothiazide/losartan 25/100, one tablet p.o. daily.  4. Loratadine 10 mg p.o. b.i.d.  5. Propranolol 60 mg p.o. twice a day.  6. Gabapentin 300 mg p.o. b.i.d.  7. Simvastatin 80 mg p.o. daily. 8. Niacin 500 mg p.o. daily.   DISCHARGE DIET: Low sodium.   DISCHARGE ACTIVITY: As tolerated.   DISCHARGE INSTRUCTIONS AND FOLLOW-UP:  1. The patient was instructed to follow up with his primary care physician, Dr. Maryland Pink, in 1 to 2 weeks.  2. He will need follow-up with Dr. Lavera Guise in 2 to 4 weeks.  3. He was also instructed to follow-up with Dr. Dionne Milo from gastroenterology in 4 to 6 weeks.   TOTAL TIME DISCHARGING THIS PATIENT: 55 minutes.   ____________________________ Lucina Mellow. Manuella Ghazi, MD vss:ap D: 11/19/2011 22:19:59 ET T: 11/20/2011 14:25:59 ET JOB#: [id number]  cc: Symphany Fleissner S. Manuella Ghazi, MD, <Dictator> Irven Easterly. Kary Kos, MD Cletis Athens, MD Jill Side, MD  Lucina Mellow [facility] MD ELECTRONICALLY SIGNED 11/20/2011 19:25

## 2014-09-14 NOTE — Consult Note (Signed)
PATIENT NAME:  [name] Whitaker, HARTE MR#:  [id number] DATE OF BIRTH:  1950-07-24  DATE OF CONSULTATION:  11/18/2011  REFERRING PHYSICIAN:  Cletis Athens, MD CONSULTING PHYSICIAN:  Jill Side, MD  REASON FOR CONSULTATION: Rectal bleeding.   HISTORY OF PRESENT ILLNESS: This is a 64 year old male with history of hypertension, migraine, hyperlipidemia, history of recent deep vein thrombosis and PE for which she was recently started on Xarelto. The patient was admitted yesterday with one episode of a small amount of rectal bleeding with a bowel movement. He came to the Emergency Room. He was hemodynamically stable. Hemoglobin was 15. He was admitted with a diagnosis of lower gastrointestinal bleed. The patient was evaluated yesterday afternoon. He denied any further bleeding since admission that morning. Denies any nausea, vomiting, abdominal pain, fever, chills, or any other significant gastrointestinal symptoms. He denies any prior history of lower gastrointestinal bleeding. Apparently, he has had a colonoscopy a few years ago which was unremarkable according to him.   PAST MEDICAL HISTORY:  1. History of recent deep vein thrombosis and PE as mentioned above. 2. Hypertension. 3. Hyperlipidemia. 4. Migraine. 5. Gout.   HOME MEDICATIONS:  1. Allopurinol. 2. Aspirin 81 mg a day. 3. Gabapentin 300 mg b.i.d. 4. Hydrochlorothiazide/losartan 25/100.  5. Loratadine 10 b.i.d.  6. Niacin 500 mg a day. 7. Propranolol 60 mg b.i.d.  8. Simvastatin 80 mg a day. 9. Xarelto.   ALLERGIES: Norvasc, penicillin.   REVIEW OF SYSTEMS: Grossly negative except for what is mentioned in the history of present illness.   PHYSICAL EXAMINATION:  GENERAL: Fairly well built male who does not appear to be in any acute distress. Fully awake, alert, and oriented.   VITAL SIGNS: Temperature 98.7, pulse 65, respirations 18, blood pressure 133/89, heart rate is in 60s.   SKIN: Skin is without jaundice or signs of  anemia.   HEENT: Unremarkable.   NECK: Veins are flat.   LUNGS: Grossly clear to auscultation bilaterally with fair air entry and no added sounds.   CARDIOVASCULAR: Regular rate and rhythm. No gallops or murmur.   ABDOMEN: Examination is fairly benign. No hepatosplenomegaly or ascites were noted. Nontender. No rebound or guarding was noted.   EXTREMITIES: No edema.   NEUROLOGIC: Examination appears to be unremarkable.   LABORATORY, DIAGNOSTIC, AND RADIOLOGICAL DATA: INR 1.8, hemoglobin on admission was 15. Subsequent hemoglobins are normal at 14.8 and 14.2. White cell count normal at 6.3, platelet count is normal as well. Troponins are less than 0.02. Electrolytes are fine. Creatinine is slightly elevated at 1.67.   ASSESSMENT AND PLAN: The patient is with minimal rectal bleeding several hours ago. His hemoglobin and hematocrit is normal. He is not tachycardic. He is not hypertensive and there are no signs of further gastrointestinal blood loss. Most likely the bleeding came from internal hemorrhoids. Apparently, a colonoscopy just about six years ago was reported normal according to the patient. The patient was recently started on Xarelto that may have aggravated the bleeding from internal hemorrhoids. Again, as mentioned above, there are no signs of active gastrointestinal blood loss and he seems to be hemodynamically stable with normal hemoglobin and hematocrit. I would recommend continuing Xarelto because of very high risk of recurrent PE. Watch for any further signs of gastrointestinal bleeding, follow hemoglobin and hematocrit, agree with liquid diet. The pain was later discussed with Dr. Max Sane this morning and apparently the patient is still doing very well without any signs of further bleeding and I agree with discharge and  follow up as outpatient for probably a routine colonoscopy as his last colonoscopy was more than five years ago.   Thank you so much for involving me in the care  of Mr. Grunder. Will sign off.   ____________________________ Jill Side, MD si:ap D: 11/18/2011 15:39:30 ET T: 11/18/2011 16:05:02 ET JOB#: [id number]  cc: Jill Side, MD, <Dictator> Cletis Athens, MD Jill Side MD ELECTRONICALLY SIGNED 11/19/2011 12:41

## 2014-09-14 NOTE — H&P (Signed)
PATIENT NAMEDENZELL, [name] Whitaker MR#:  [id number] DATE OF BIRTH:  Apr 21, 1951  DATE OF ADMISSION:  11/17/2011  PRIMARY CARE PHYSICIAN: Cletis Athens, MD   CHIEF COMPLAINT: Chest pain and rectal bleed.   HISTORY AND PHYSICAL: The patient is a 64 year old male with a history of hypertension, migraine headaches, hyperlipidemia, and recent bilateral pulmonary emboli and left lower extremity deep venous thrombosis diagnosed at [facility], who is on Xarelto and comes in with the above complaints. The patient at 8:15 noticed some blood in his stool as he was flushing the toilet. He has not had any since then or prior to this. He had a colonoscopy about six years ago which he says was normal. He was supposed to come back in seven years. He is on Xarelto  20 mg a day. He was taking b.i.d. since last night for recent bilateral pulmonary emboli and DVT that was diagnosed two weeks ago at [facility]. Then at 8:30, he developed chest pain in the center of his chest with some shortness of breath which is now relieved. No other symptoms are associated with this.  EKG and troponins are all normal in the ER.   REVIEW OF SYSTEMS: CONSTITUTIONAL: No fever, fatigue, weakness. EYES: No blurred or double vision, glaucoma or cataracts. ENT: No ear pain, hearing loss. Positive seasonal allergies. No postnasal drip, snoring. RESPIRATORY: No cough, wheezing, hemoptysis, chronic obstructive pulmonary disease. CARDIOVASCULAR: Chest pain as mentioned above. No palpitations, orthopnea, edema, arrhythmia, dyspnea on exertion. GASTROINTESTINAL: No nausea, vomiting, diarrhea, abdominal pain, melena, or ulcers. He had some blood in his stool today.  GENITOURINARY: No dysuria or hematuria. ENDOCRINE: No polyuria or polydipsia. HEMATOLOGIC/LYMPHATIC: No anemia or easy bruising. SKIN: No rash or lesions. MUSCULOSKELETAL: No pain in the neck or shoulders. Positive history of gout. NEUROLOGICAL: No history of cerebrovascular accident, transient  ischemic attack or seizures. PSYCHIATRIC: No history of anxiety or depression.   PAST MEDICAL HISTORY:  1. Hypertension.  2. Pulmonary emboli and deep venous thrombosis. This is the second time the patient has been diagnosed with pulmonary emboli. His first one was after a foot fracture. This time, they have no idea why he had a pulmonary emboli/deep venous thrombosis. This was diagnosed about two weeks ago at [facility]. He is currently taking Xarelto.  3. He had a prostate check recently which was normal.  4. He had a colonoscopy about six years ago which he says was normal,  and he is due in one year from now.  5. Benign prostatic hypertrophy.  6. Hyperlipidemia.  7. Migraines.  8. Gout.   MEDICATIONS:  1. Allopurinol 300 mg daily.  2. Aspirin 81 mg daily. 3. Finasteride 5 mg daily.  4. Gabapentin 300 mg b.i.d.  5. HCTZ/losartan 25/100 daily.  6. Loratadine 10 mg b.i.d.  7. Niacin 500 mg daily. 8. Propranolol  60 mg b.i.d.  9. Simvastatin 80 mg at bedtime.   ALLERGIES: Norvasc causes swelling; penicillin unknown; reserpine causes swelling, shortness of breath and hives.   PAST SURGICAL HISTORY:  1. TURP.  2. Appendectomy.   FAMILY HISTORY: Positive for coronary artery disease, congestive heart failure, diabetes.   PHYSICAL EXAMINATION:  VITAL SIGNS: Temperature 97.9, pulse 54, respirations 18, blood pressure 125/84, 98% on room air.   GENERAL: The patient is alert and oriented, not in acute distress.   HEENT: Head is atraumatic. Pupils are round and reactive. Sclerae are anicteric. Mucous membranes are moist. Oropharynx is clear.   NECK: Supple without jugular venous  distention, carotid bruits or enlarged thyroid.   CARDIOVASCULAR: Regular rate and rhythm. No murmurs, gallops, or rubs. PMI is nondisplaced.   LUNGS: Clear to auscultation bilaterally without crackles, rales, rhonchi or wheezing.   EXTREMITIES: No cyanosis, clubbing or edema.   ABDOMEN: Bowel sounds are  positive and nontender, nondistended. No hepatosplenomegaly.  NEUROLOGICAL:   Cranial nerves II through XII are intact. There are no focal deficits.   SKIN: Without rash or lesions.   MUSCULOSKELETAL: Strength 5/5 in all extremities.   LABORATORY, DIAGNOSTIC AND RADIOLOGICAL DATA:  White blood cells 5.6, hemoglobin 15, hematocrit 45, platelets are 183. Sodium 142, potassium 3.7, chloride 104, bicarbonate 32, BUN 14, creatinine 1.46/133. Troponin less than 0.02.  CK 363, CPK-MB 1.4.  Bilirubin 0.3, ALT 32, AST 31, total protein 7.2, albumin 4.0.  INR 1.8.  Chest x-ray shows no acute cardiopulmonary disease.  EKG: Normal sinus rhythm. No ST elevation or depression.   ASSESSMENT AND PLAN: The patient is a 64 year old male recently diagnosed with pulmonary emboli and deep vein thrombosis at [facility], is on Xarelto, who presents with rectal bleeding x1 with guaiac-negative per Emergency Room physician, and chest pain.   1. Rectal bleed: Likely internal hemorrhoids. However, the patient is on aspirin and Xarelto. Hold the aspirin. He needs Xarelto due to his recent diagnosis of pulmonary emboli. He had a colonoscopy six years ago which he says was normal. I will go ahead and consult GI; however, I will continue his Xarelto and observe clinically, and also order a hemoglobin later on today. His vitals are stable and, as mentioned, he is guaiac-negative. He needs his Xarelto due to a recent history of PE/deep vein thrombosis. I have also ordered the results and clinical course from [facility]. He could be a candidate, if he continues to have bleeding, for an IVC.  2. Chest pain: Likely secondary to anxiety versus PE.  We will continue to cycle cardiac enzymes, place the patient on telemetry and monitor closely. EKG is normal.  3. Recent PE/deep venous thrombosis: As mentioned, the patient will continue Xarelto for now, hold aspirin and monitor closely. I have asked Dr. Renard Hamper to please get information  from his last hospitalization at [facility] regarding his PE/DVT.  4. Hypertension: Continue outpatient medications.  5. Benign prostatic hypertrophy: We will finasteride.   6. Gout: Continue allopurinol.  7. History of migraines: Continue propranolol and gabapentin.  8. Hyperlipidemia: Continue Niaspan and simvastatin.   CODE STATUS:  The patient is a FULL CODE STATUS.     TIME SPENT: Approximately  55 minutes.   ____________________________ Donell Beers. Benjie Karvonen, MD spm:cbb D: 11/17/2011 14:06:54 ET T: 11/17/2011 14:46:35 ET JOB#: [id number]  cc: Gleen Ripberger P. Benjie Karvonen, MD, <Dictator> Cletis Athens, MD Donell Beers Renie Stelmach MD ELECTRONICALLY SIGNED 11/17/2011 16:29

## 2014-09-14 NOTE — Consult Note (Signed)
Brief Consult Note: Diagnosis: Rectal bleeding.   Patient was seen by consultant.   Comments: Patient with minimal rectal bleeding times one thsi morning. Probably secondary to internal hemorrhoids. No signs of active GI bleed and H and H is normal.  Follow H and H. Agree with continuing Xeralto. Further recommendations depending on his hospital course.  Electronic Signatures: Jill Side (MD)  (Signed 27-Jun-13 18:43)  Authored: Brief Consult Note   Last Updated: 27-Jun-13 18:43 by Jill Side (MD)

## 2014-10-16 ENCOUNTER — Encounter: Payer: Self-pay | Admitting: Emergency Medicine

## 2014-10-16 ENCOUNTER — Emergency Department: Payer: BC Managed Care – PPO

## 2014-10-16 ENCOUNTER — Emergency Department
Admission: EM | Admit: 2014-10-16 | Discharge: 2014-10-16 | Disposition: A | Discharge status: Home / Self Care | Payer: BC Managed Care – PPO | Attending: Emergency Medicine | Admitting: Emergency Medicine

## 2014-10-16 DIAGNOSIS — Z880 Allergy status to penicillin: Secondary | ICD-10-CM | POA: Diagnosis not present

## 2014-10-16 DIAGNOSIS — M62831 Muscle spasm of calf: Secondary | ICD-10-CM

## 2014-10-16 DIAGNOSIS — Z87891 Personal history of nicotine dependence: Secondary | ICD-10-CM | POA: Insufficient documentation

## 2014-10-16 DIAGNOSIS — Z79899 Other long term (current) drug therapy: Secondary | ICD-10-CM | POA: Diagnosis not present

## 2014-10-16 DIAGNOSIS — M79604 Pain in right leg: Secondary | ICD-10-CM | POA: Diagnosis present

## 2014-10-16 DIAGNOSIS — I10 Essential (primary) hypertension: Secondary | ICD-10-CM | POA: Insufficient documentation

## 2014-10-16 LAB — BASIC METABOLIC PANEL
ANION GAP: 6 (ref 5–15)
BUN: 15 mg/dL (ref 6–20)
CO2: 30 mmol/L (ref 22–32)
Calcium: 8.9 mg/dL (ref 8.9–10.3)
Chloride: 106 mmol/L (ref 101–111)
Creatinine, Ser: 1.41 mg/dL — ABNORMAL HIGH (ref 0.61–1.24)
GFR, EST NON AFRICAN AMERICAN: 52 mL/min — AB (ref >60)
Glucose, Bld: 145 mg/dL — ABNORMAL HIGH (ref 65–99)
Potassium: 3.5 mmol/L (ref 3.5–5.1)
Sodium: 142 mmol/L (ref 135–145)

## 2014-10-16 NOTE — ED Provider Notes (Signed)
[facility] Emergency Department Provider Note  ____________________________________________  Time seen: 6:10 PM  I have reviewed the triage vital signs and the nursing notes.   HISTORY  Chief Complaint Leg Pain    HPI [name] Whitaker is a 64 y.o. male who reports a sudden onset of right lateral calf pain about 2 hours ago. It is tense and there is a palpable tense area that is tender to the touch. No recent injuries travel trauma or hospitalization. He does have a history of bilateral lower extremity DVT and PE which she has been on Xarelto for his complaint for this and has had no issues. Denies any chest pain or shortness of breath, dizziness or passing out. No fevers or chills or cough.  Patient does have hypertension for which she takes diuretics.  The calf pain is achy, constant, no aggravating or alleviating factors, no associated symptoms     Past Medical History  Diagnosis Date  . Gout   . Hypercholesteremia   . Pulmonary embolism   . Hypertension   . Migraine   . OSA (obstructive sleep apnea)     on CPAP     Patient Active Problem List   Diagnosis Date Noted  . Bilateral calf pain 08/02/2014  . Dyspnea 06/27/2014  . Chest pain 09/27/2013  . Abnormal ECG 09/27/2013  . 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 Pure hypercholesterolemia 09/27/2013  . DVT, lower extremity 10/27/2011  . OSA on CPAP 10/27/2011  . Pulmonary embolism 10/25/2011  . HTN (hypertension) 10/25/2011  . Hyperlipidemia 10/25/2011  . Gout 10/25/2011    Past Surgical History  Procedure Laterality Date  . Appendectomy    . Nasal septum surgery    . Tonsillectomy    . Testicle surgery    . Cardiac catheterization      Current Outpatient Rx  Name  Route  Sig  Dispense  Refill  . acetaminophen (TYLENOL) 325 MG tablet   Oral   Take 650 mg by mouth every 6 (six) hours as needed for mild pain.          Marland Kitchen allopurinol (ZYLOPRIM) 300 MG tablet   Oral   Take 300 mg  by mouth daily.         Marland Kitchen atorvastatin (LIPITOR) 40 MG tablet   Oral   Take 1 tablet (40 mg total) by mouth daily.   30 tablet   3   . EPINEPHrine 0.3 mg/0.3 mL IJ SOAJ injection   Intramuscular   Inject 0.3 mg into the muscle as needed.         . famotidine (PEPCID) 20 MG tablet      One at bedtime   30 tablet   2   . finasteride (PROSCAR) 5 MG tablet   Oral   Take 5 mg by mouth daily.         . fluticasone (FLONASE) 50 MCG/ACT nasal spray   Each Nare   Place 1 spray into both nostrils daily as needed for allergies.          Marland Kitchen gabapentin (NEURONTIN) 400 MG capsule   Oral   Take 800 mg by mouth 2 (two) times daily.         Marland Kitchen glipiZIDE (GLUCOTROL) 5 MG tablet   Oral   Take 5 mg by mouth daily before breakfast.         . hydrochlorothiazide (MICROZIDE) 12.5 MG capsule   Oral   Take 12.5 mg by mouth daily.         Marland Kitchen  ketoconazole (NIZORAL) 2 % cream   Topical   Apply 1 application topically daily as needed for irritation (feet).          Marland Kitchen loratadine (CLARITIN) 10 MG tablet   Oral   Take 10 mg by mouth daily.         Marland Kitchen losartan (COZAAR) 100 MG tablet   Oral   Take 100 mg by mouth daily.         . pantoprazole (PROTONIX) 40 MG tablet   Oral   Take 1 tablet (40 mg total) by mouth daily. Take 30-60 min before first meal of the day   30 tablet   2   . potassium chloride SA (K-DUR,KLOR-CON) 20 MEQ tablet      Take 1 tab daily         . propranolol (INDERAL) 60 MG tablet   Oral   Take 60 mg by mouth 2 (two) times daily.         . rivaroxaban (XARELTO) 20 MG TABS tablet   Oral   Take 20 mg by mouth every morning.         . traMADol (ULTRAM) 50 MG tablet   Oral   Take 50 mg by mouth every 6 (six) hours as needed for moderate pain.           Allergies Penicillins and Amlodipine  Family History  Problem Relation Age of Onset  . Hypertension Mother   . Hypertension Father   . Clotting disorder Brother     Social  History History  Substance Use Topics  . Smoking status: Former Smoker -- 4 years    Types: Pipe    Quit date: 05/24/1983  . Smokeless tobacco: Never Used     Comment: smoked a pipe for 4 yrs- quit 1985  . Alcohol Use: No    Review of Systems  Constitutional: No fever or chills. No weight changes Eyes:No blurry vision or double vision.  ENT: No sore throat. Cardiovascular: No chest pain. Respiratory: No dyspnea or cough. Gastrointestinal: Negative for abdominal pain, vomiting and diarrhea.  No BRBPR or melena. Genitourinary: Negative for dysuria, urinary retention, bloody urine, or difficulty urinating. Musculoskeletal: Negative for back pain. Right leg pain as above. No joint pain.. Skin: Negative for rash. Neurological: Negative for headaches, focal weakness or numbness. Psychiatric:No anxiety or depression.   Endocrine:No hot/cold intolerance, changes in energy, or sleep difficulty.  10-point ROS otherwise negative.  ____________________________________________   PHYSICAL EXAM:  VITAL SIGNS: ED Triage Vitals  Enc Vitals Group     BP 10/16/14 1735 174/108 mmHg     Pulse Rate 10/16/14 1735 66     Resp 10/16/14 1735 20     Temp 10/16/14 1735 97.6 F (36.4 C)     Temp Source 10/16/14 1735 Oral     SpO2 10/16/14 1735 98 %     Weight 10/16/14 1735 255 lb (115.667 kg)     Height 10/16/14 1735 5\' 10"  (1.778 m)     Head Cir --      Peak Flow --      Pain Score 10/16/14 1736 8     Pain Loc --      Pain Edu? --      Excl. in [place]? --      Constitutional: Alert and oriented. Well appearing and in no distress. Eyes: No scleral icterus. No conjunctival pallor. PERRL. EOMI ENT   Head: Normocephalic and atraumatic.   Nose: No congestion/rhinnorhea. No septal  hematoma   Mouth/Throat: MMM, no pharyngeal erythema. No peritonsillar mass. No uvula shift.   Neck: No stridor. No SubQ emphysema. No meningismus. Hematological/Lymphatic/Immunilogical: No cervical  lymphadenopathy. Cardiovascular: RRR. Normal and symmetric distal pulses are present in all extremities. No murmurs, rubs, or gallops. Respiratory: Normal respiratory effort without tachypnea nor retractions. Breath sounds are clear and equal bilaterally. No wheezes/rales/rhonchi.   Musculoskeletal: No edema, erythema, or asymmetric swelling. There is an area in the mid calf along the right lateral side of the right leg approximately 2 x 2 cm nodular area that is tender to the touch. The area is not warm or fluctuant. There is no evidence of surrounding inflammation. No calf tenderness other than that and no popliteal tenderness normal symmetric distal posterior tibial pulses in both legs.  Neurologic:   Normal speech and language.  CN 2-10 normal. Motor grossly intact. No pronator drift.  Normal gait. No gross focal neurologic deficits are appreciated.  Normal inversion eversion dorsiflexion and plantar flexion of the right foot Skin:  Skin is warm, dry and intact. No rash noted.  No petechiae, purpura, or bullae. Psychiatric: Mood and affect are normal. Speech and behavior are normal. Patient exhibits appropriate insight and judgment.  ____________________________________________    LABS (pertinent positives/negatives) (all labs ordered are listed, but only abnormal results are displayed) Labs Reviewed  BASIC METABOLIC PANEL - Abnormal; Notable for the following:    Glucose, Bld 145 (*)    Creatinine, Ser 1.41 (*)    GFR calc non Af Amer 52 (*)    All other components within normal limits   ____________________________________________   EKG    ____________________________________________    RADIOLOGY  DVT relatively unremarkable  ____________________________________________   PROCEDURES  ____________________________________________   INITIAL IMPRESSION / ASSESSMENT AND PLAN / ED COURSE  Pertinent labs & imaging results that were available during my care of the  patient were reviewed by me and considered in my medical decision making (see chart for details).  Overall, examination is consistent with a muscle spasm. However, due to the patient's medical history with DVT as well as diuretic use, we'll check a BMP for his potassium and get an ultrasound of the right leg to evaluate for a DVT which would indicate Xarelto treatment failure. Well appearing in no acute distress at this time. Anticipate that he will be discharged home to follow up with primary care with a trial of NSAIDs and heating pad if his workup does not reveal any significant concerns ----------------------------------------- 7:44 PM on 10/16/2014 -----------------------------------------  Workup unremarkable. Joints okay no DVT. Patient, comfortable hemodynamically stable. We'll discharge home, heating pad gentle stretching and exercises for the right leg muscle spasm. Follow-up with primary care as needed. No evidence of DVT or PE at this time. No evidence of  infection  ____________________________________________   FINAL CLINICAL IMPRESSION(S) / ED DIAGNOSES  Final diagnoses:  Muscle spasm of calf      Carrie Mew, MD 10/16/14 1945

## 2014-10-16 NOTE — Discharge Instructions (Signed)
Heat Therapy Heat therapy can help make painful, stiff muscles and joints feel better. Do not use heat on new injuries. Wait at least 48 hours after an injury to use heat. Do not use heat when you have aches or pains right after an activity. If you still have pain 3 hours after stopping the activity, then you may use heat. HOME CARE Wet heat pack  Soak a clean towel in warm water. Squeeze out the extra water.  Put the warm, wet towel in a plastic bag.  Place a thin, dry towel between your skin and the bag.  Put the heat pack on the area for 5 minutes, and check your skin. Your skin may be pink, but it should not be red.  Leave the heat pack on the area for 15 to 30 minutes.  Repeat this every 2 to 4 hours while awake. Do not use heat while you are sleeping. Warm water bath  Fill a tub with warm water.  Place the affected body part in the tub.  Soak the area for 20 to 40 minutes.  Repeat as needed. Hot water bottle  Fill the water bottle half full with hot water.  Press out the extra air. Close the cap tightly.  Place a dry towel between your skin and the bottle.  Put the bottle on the area for 5 minutes, and check your skin. Your skin may be pink, but it should not be red.  Leave the bottle on the area for 15 to 30 minutes.  Repeat this every 2 to 4 hours while awake. Electric heating pad  Place a dry towel between your skin and the heating pad.  Set the heating pad on low heat.  Put the heating pad on the area for 10 minutes, and check your skin. Your skin may be pink, but it should not be red.  Leave the heating pad on the area for 20 to 40 minutes.  Repeat this every 2 to 4 hours while awake.  Do not lie on the heating pad.  Do not fall asleep while using the heating pad.  Do not use the heating pad near water. GET HELP RIGHT AWAY IF:  You get blisters or red skin.  Your skin is puffy (swollen), or you lose feeling (numbness) in the affected area.  You  have any new problems.  Your problems are getting worse.  You have any questions or concerns. If you have any problems, stop using heat therapy until you see your doctor. MAKE SURE YOU:  Understand these instructions.  Will watch your condition.  Will get help right away if you are not doing well or get worse. Document Released: 08/01/2011 Document Reviewed: 07/02/2013 [facility] Patient Information 2015 [place]. This information is not intended to replace advice given to you by your health care provider. Make sure you discuss any questions you have with your health care provider.  Muscle Cramps and Spasms Muscle cramps and spasms occur when a muscle or muscles tighten and you have no control over this tightening (involuntary muscle contraction). They are a common problem and can develop in any muscle. The most common place is in the calf muscles of the leg. Both muscle cramps and muscle spasms are involuntary muscle contractions, but they also have differences:   Muscle cramps are sporadic and painful. They may last a few seconds to a quarter of an hour. Muscle cramps are often more forceful and last longer than muscle spasms.  Muscle spasms  may or may not be painful. They may also last just a few seconds or much longer. CAUSES  It is uncommon for cramps or spasms to be due to a serious underlying problem. In many cases, the cause of cramps or spasms is unknown. Some common causes are:   Overexertion.   Overuse from repetitive motions (doing the same thing over and over).   Remaining in a certain position for a long period of time.   Improper preparation, form, or technique while performing a sport or activity.   Dehydration.   Injury.   Side effects of some medicines.   Abnormally low levels of the salts and ions in your blood (electrolytes), especially potassium and calcium. This could happen if you are taking water pills (diuretics) or you are pregnant.  Some  underlying medical problems can make it more likely to develop cramps or spasms. These include, but are not limited to:   Diabetes.   Parkinson disease.   Hormone disorders, such as thyroid problems.   Alcohol abuse.   Diseases specific to muscles, joints, and bones.   Blood vessel disease where not enough blood is getting to the muscles.  HOME CARE INSTRUCTIONS   Stay well hydrated. Drink enough water and fluids to keep your urine clear or pale yellow.  It may be helpful to massage, stretch, and relax the affected muscle.  For tight or tense muscles, use a warm towel, heating pad, or hot shower water directed to the affected area.  If you are sore or have pain after a cramp or spasm, applying ice to the affected area may relieve discomfort.  Put ice in a plastic bag.  Place a towel between your skin and the bag.  Leave the ice on for 15-20 minutes, 03-04 times a day.  Medicines used to treat a known cause of cramps or spasms may help reduce their frequency or severity. Only take over-the-counter or prescription medicines as directed by your caregiver. SEEK MEDICAL CARE IF:  Your cramps or spasms get more severe, more frequent, or do not improve over time.  MAKE SURE YOU:   Understand these instructions.  Will watch your condition.  Will get help right away if you are not doing well or get worse. Document Released: 10/29/2001 Document Revised: 09/03/2012 Document Reviewed: 04/25/2012 [facility] Patient Information 2015 [place], [place]. This information is not intended to replace advice given to you by your health care provider. Make sure you discuss any questions you have with your health care provider.

## 2014-10-16 NOTE — ED Notes (Signed)
Patient to ED with c/o right calf pain for about the last 2 hours, patient reports history of blood clots to bilateral lower legs as well as lungs in the past. Patient reports starting Xarelto 2 years ago and has had no problems since then until today.

## 2014-10-16 NOTE — ED Notes (Signed)
R lower leg pain that began today. Hx of blood clots. Pt currently takes Xarelto. Pt alert and oriented X4, active, cooperative, pt in NAD. RR even and unlabored, color WNL.

## 2015-07-31 DIAGNOSIS — Z7984 Long term (current) use of oral hypoglycemic drugs: Secondary | ICD-10-CM | POA: Diagnosis not present

## 2015-07-31 DIAGNOSIS — M545 Low back pain: Secondary | ICD-10-CM | POA: Diagnosis not present

## 2015-07-31 DIAGNOSIS — I10 Essential (primary) hypertension: Secondary | ICD-10-CM | POA: Diagnosis not present

## 2015-07-31 DIAGNOSIS — E119 Type 2 diabetes mellitus without complications: Secondary | ICD-10-CM | POA: Diagnosis not present

## 2015-07-31 DIAGNOSIS — G43909 Migraine, unspecified, not intractable, without status migrainosus: Secondary | ICD-10-CM | POA: Diagnosis not present

## 2015-07-31 DIAGNOSIS — E785 Hyperlipidemia, unspecified: Secondary | ICD-10-CM | POA: Diagnosis not present

## 2015-08-03 ENCOUNTER — Ambulatory Visit: Payer: Self-pay | Admitting: Physician Assistant

## 2015-08-12 DIAGNOSIS — G4733 Obstructive sleep apnea (adult) (pediatric): Secondary | ICD-10-CM | POA: Diagnosis not present

## 2015-08-20 DIAGNOSIS — Z5181 Encounter for therapeutic drug level monitoring: Secondary | ICD-10-CM | POA: Diagnosis not present

## 2015-08-20 DIAGNOSIS — Z86718 Personal history of other venous thrombosis and embolism: Secondary | ICD-10-CM | POA: Diagnosis not present

## 2015-08-20 DIAGNOSIS — I10 Essential (primary) hypertension: Secondary | ICD-10-CM | POA: Diagnosis not present

## 2015-08-20 DIAGNOSIS — Z79899 Other long term (current) drug therapy: Secondary | ICD-10-CM | POA: Diagnosis not present

## 2015-08-20 DIAGNOSIS — Z7901 Long term (current) use of anticoagulants: Secondary | ICD-10-CM | POA: Diagnosis not present

## 2015-08-24 DIAGNOSIS — Z6837 Body mass index (BMI) 37.0-37.9, adult: Secondary | ICD-10-CM | POA: Diagnosis not present

## 2015-08-24 DIAGNOSIS — M4806 Spinal stenosis, lumbar region: Secondary | ICD-10-CM | POA: Diagnosis not present

## 2015-08-24 DIAGNOSIS — M48061 Spinal stenosis, lumbar region without neurogenic claudication: Secondary | ICD-10-CM | POA: Insufficient documentation

## 2015-08-24 DIAGNOSIS — M47817 Spondylosis without myelopathy or radiculopathy, lumbosacral region: Secondary | ICD-10-CM | POA: Diagnosis not present

## 2015-09-09 DIAGNOSIS — E119 Type 2 diabetes mellitus without complications: Secondary | ICD-10-CM | POA: Diagnosis not present

## 2015-09-09 DIAGNOSIS — G43909 Migraine, unspecified, not intractable, without status migrainosus: Secondary | ICD-10-CM | POA: Diagnosis not present

## 2015-09-09 DIAGNOSIS — M47817 Spondylosis without myelopathy or radiculopathy, lumbosacral region: Secondary | ICD-10-CM | POA: Diagnosis not present

## 2015-09-09 DIAGNOSIS — I10 Essential (primary) hypertension: Secondary | ICD-10-CM | POA: Diagnosis not present

## 2015-09-09 DIAGNOSIS — Z7984 Long term (current) use of oral hypoglycemic drugs: Secondary | ICD-10-CM | POA: Diagnosis not present

## 2015-09-09 DIAGNOSIS — H524 Presbyopia: Secondary | ICD-10-CM | POA: Diagnosis not present

## 2015-09-09 DIAGNOSIS — Z6837 Body mass index (BMI) 37.0-37.9, adult: Secondary | ICD-10-CM | POA: Diagnosis not present

## 2015-10-22 DIAGNOSIS — E119 Type 2 diabetes mellitus without complications: Secondary | ICD-10-CM | POA: Diagnosis not present

## 2015-11-19 DIAGNOSIS — M545 Low back pain: Secondary | ICD-10-CM | POA: Diagnosis not present

## 2015-11-19 DIAGNOSIS — E119 Type 2 diabetes mellitus without complications: Secondary | ICD-10-CM | POA: Diagnosis not present

## 2015-11-19 DIAGNOSIS — I10 Essential (primary) hypertension: Secondary | ICD-10-CM | POA: Diagnosis not present

## 2015-11-19 DIAGNOSIS — E785 Hyperlipidemia, unspecified: Secondary | ICD-10-CM | POA: Diagnosis not present

## 2015-12-16 DIAGNOSIS — M47817 Spondylosis without myelopathy or radiculopathy, lumbosacral region: Secondary | ICD-10-CM | POA: Diagnosis not present

## 2015-12-16 DIAGNOSIS — Z6837 Body mass index (BMI) 37.0-37.9, adult: Secondary | ICD-10-CM | POA: Diagnosis not present

## 2016-02-01 DIAGNOSIS — Z86711 Personal history of pulmonary embolism: Secondary | ICD-10-CM | POA: Diagnosis not present

## 2016-02-01 DIAGNOSIS — Z5181 Encounter for therapeutic drug level monitoring: Secondary | ICD-10-CM | POA: Diagnosis not present

## 2016-02-01 DIAGNOSIS — Z7901 Long term (current) use of anticoagulants: Secondary | ICD-10-CM | POA: Diagnosis not present

## 2016-02-01 DIAGNOSIS — Z86718 Personal history of other venous thrombosis and embolism: Secondary | ICD-10-CM | POA: Diagnosis not present

## 2016-06-14 DIAGNOSIS — G4733 Obstructive sleep apnea (adult) (pediatric): Secondary | ICD-10-CM | POA: Diagnosis not present

## 2016-06-14 DIAGNOSIS — E119 Type 2 diabetes mellitus without complications: Secondary | ICD-10-CM | POA: Diagnosis not present

## 2016-06-14 DIAGNOSIS — I10 Essential (primary) hypertension: Secondary | ICD-10-CM | POA: Diagnosis not present

## 2016-06-22 ENCOUNTER — Other Ambulatory Visit: Payer: Self-pay | Admitting: Neurology

## 2016-06-22 DIAGNOSIS — G43909 Migraine, unspecified, not intractable, without status migrainosus: Secondary | ICD-10-CM

## 2016-06-29 DIAGNOSIS — G43119 Migraine with aura, intractable, without status migrainosus: Secondary | ICD-10-CM | POA: Insufficient documentation

## 2016-06-29 DIAGNOSIS — G4733 Obstructive sleep apnea (adult) (pediatric): Secondary | ICD-10-CM | POA: Insufficient documentation

## 2016-07-01 ENCOUNTER — Ambulatory Visit: Payer: No Typology Code available for payment source

## 2016-07-09 ENCOUNTER — Ambulatory Visit: Payer: Medicare HMO

## 2017-05-25 ENCOUNTER — Encounter: Payer: Self-pay | Admitting: Emergency Medicine

## 2017-05-25 ENCOUNTER — Emergency Department
Admission: EM | Admit: 2017-05-25 | Discharge: 2017-05-26 | Disposition: A | Discharge status: Home / Self Care | Payer: BC Managed Care – PPO | Attending: Emergency Medicine | Admitting: Emergency Medicine

## 2017-05-25 DIAGNOSIS — I10 Essential (primary) hypertension: Secondary | ICD-10-CM | POA: Diagnosis not present

## 2017-05-25 DIAGNOSIS — Z7984 Long term (current) use of oral hypoglycemic drugs: Secondary | ICD-10-CM | POA: Insufficient documentation

## 2017-05-25 DIAGNOSIS — Z7901 Long term (current) use of anticoagulants: Secondary | ICD-10-CM | POA: Insufficient documentation

## 2017-05-25 DIAGNOSIS — Z87891 Personal history of nicotine dependence: Secondary | ICD-10-CM | POA: Diagnosis not present

## 2017-05-25 DIAGNOSIS — Z79899 Other long term (current) drug therapy: Secondary | ICD-10-CM | POA: Insufficient documentation

## 2017-05-25 DIAGNOSIS — M791 Myalgia, unspecified site: Secondary | ICD-10-CM | POA: Diagnosis not present

## 2017-05-25 DIAGNOSIS — M79621 Pain in right upper arm: Secondary | ICD-10-CM | POA: Diagnosis present

## 2017-05-25 DIAGNOSIS — E876 Hypokalemia: Secondary | ICD-10-CM | POA: Diagnosis not present

## 2017-05-25 LAB — BASIC METABOLIC PANEL
ANION GAP: 8 (ref 5–15)
BUN: 15 mg/dL (ref 6–20)
CHLORIDE: 102 mmol/L (ref 101–111)
CO2: 31 mmol/L (ref 22–32)
Calcium: 9 mg/dL (ref 8.9–10.3)
Creatinine, Ser: 1.37 mg/dL — ABNORMAL HIGH (ref 0.61–1.24)
GFR calc non Af Amer: 53 mL/min — ABNORMAL LOW (ref >60)
Glucose, Bld: 114 mg/dL — ABNORMAL HIGH (ref 65–99)
POTASSIUM: 3 mmol/L — AB (ref 3.5–5.1)
Sodium: 141 mmol/L (ref 135–145)

## 2017-05-25 LAB — CBC
HEMATOCRIT: 44.5 % (ref 40.0–52.0)
HEMOGLOBIN: 14.6 g/dL (ref 13.0–18.0)
MCH: 28.3 pg (ref 26.0–34.0)
MCHC: 32.9 g/dL (ref 32.0–36.0)
MCV: 86.1 fL (ref 80.0–100.0)
Platelets: 213 10*3/uL (ref 150–440)
RBC: 5.17 MIL/uL (ref 4.40–5.90)
RDW: 15.2 % — ABNORMAL HIGH (ref 11.5–14.5)
WBC: 6.3 10*3/uL (ref 3.8–10.6)

## 2017-05-25 LAB — CK: Total CK: 281 U/L (ref 49–397)

## 2017-05-25 NOTE — ED Triage Notes (Addendum)
Pt comes into the ED via POV sent by his PCP stating that his CK is 231 and the patient has been having severe shoulder pain for a couple of days.  Patient states he received an injection in the right shoulder a couple of days ago.  Patient in NAD at this time with even and unlabored respirations.

## 2017-05-26 MED ORDER — POTASSIUM CHLORIDE CRYS ER 20 MEQ PO TBCR
40.0000 meq | EXTENDED_RELEASE_TABLET | Freq: Once | ORAL | Status: AC
  Administered 2017-05-26: 40 meq via ORAL
  Filled 2017-05-26: qty 2

## 2017-05-26 NOTE — ED Provider Notes (Signed)
[facility] Emergency Department Provider Note   ____________________________________________   I have reviewed the triage vital signs and the nursing notes.   HISTORY  Chief Complaint Right shoulder and arm pain/spasms.  History limited by: Not Limited   HPI [name] Whitaker is a 67 y.o. male who presents to the emergency department today because of CK of 231 obtained by PCP however whose main complaint is right shoulder and arm pain/spasms.   LOCATION:right shoulder and arm DURATION:2-3 weeks TIMING: started about a week after injection of migraine medication QUALITY: spasms CONTEXT: patient had injections done in both arms for migraine. About a week later the pain started in his right shoulder and arm. He has also been having spasms. Went to PCP and had CK level done. Was called by PCP to come to the emergency department MODIFYING FACTORS: none ASSOCIATED SYMPTOMS: denies any chest pain. Denies any shortness of breath.  Per medical record review patient has a history of migraines  Past Medical History:  Diagnosis Date  . Gout   . Hypercholesteremia   . Hypertension   . Migraine   . OSA (obstructive sleep apnea)    on CPAP   . Pulmonary embolism [facility])     Patient Active Problem List   Diagnosis Date Noted  . Bilateral calf pain 08/02/2014  . Dyspnea 06/27/2014  . Chest pain 09/27/2013  . Abnormal ECG 09/27/2013  . Dilated aortic root ([place]) 09/27/2013  . Pure hypercholesterolemia 09/27/2013  . DVT, lower extremity ([place]) 10/27/2011  . OSA on CPAP 10/27/2011  . Pulmonary embolism ([place]) 10/25/2011  . HTN (hypertension) 10/25/2011  . Hyperlipidemia 10/25/2011  . Gout 10/25/2011    Past Surgical History:  Procedure Laterality Date  . APPENDECTOMY    . CARDIAC CATHETERIZATION    . NASAL SEPTUM SURGERY    . TESTICLE SURGERY    . TONSILLECTOMY      Prior to Admission medications   Medication Sig Start Date End Date Taking?  Authorizing Provider  acetaminophen (TYLENOL) 325 MG tablet Take 650 mg by mouth every 6 (six) hours as needed for mild pain.     [provider]  allopurinol (ZYLOPRIM) 300 MG tablet Take 300 mg by mouth daily.    [provider]  atorvastatin (LIPITOR) 40 MG tablet Take 1 tablet (40 mg total) by mouth daily. 09/27/13   Jerline Pain, MD  EPINEPHrine 0.3 mg/0.3 mL IJ SOAJ injection Inject 0.3 mg into the muscle as needed.    [provider]  famotidine (PEPCID) 20 MG tablet One at bedtime 06/27/14   Tanda Rockers, MD  finasteride (PROSCAR) 5 MG tablet Take 5 mg by mouth daily.    [provider]  fluticasone (FLONASE) 50 MCG/ACT nasal spray Place 1 spray into both nostrils daily as needed for allergies.     [provider]  gabapentin (NEURONTIN) 400 MG capsule Take 800 mg by mouth 2 (two) times daily.    [provider]  glipiZIDE (GLUCOTROL) 5 MG tablet Take 5 mg by mouth daily before breakfast.    [provider]  hydrochlorothiazide (MICROZIDE) 12.5 MG capsule Take 12.5 mg by mouth daily.    [provider]  ketoconazole (NIZORAL) 2 % cream Apply 1 application topically daily as needed for irritation (feet).  10/07/13   [provider]  loratadine (CLARITIN) 10 MG tablet Take 10 mg by mouth daily.    [provider]  losartan (COZAAR) 100 MG tablet Take 100 mg  by mouth daily.    [provider]  pantoprazole (PROTONIX) 40 MG tablet Take 1 tablet (40 mg total) by mouth daily. Take 30-60 min before first meal of the day 06/27/14   Tanda Rockers, MD  potassium chloride SA (K-DUR,KLOR-CON) 20 MEQ tablet Take 1 tab daily 09/06/13   [provider]  propranolol (INDERAL) 60 MG tablet Take 60 mg by mouth 2 (two) times daily.    [provider]  rivaroxaban (XARELTO) 20 MG TABS tablet Take 20 mg by mouth every morning.    [provider]  traMADol (ULTRAM) 50 MG tablet Take 50 mg  by mouth every 6 (six) hours as needed for moderate pain.    [provider]    Allergies Penicillins and Amlodipine  Family History  Problem Relation Age of Onset  . Hypertension Mother   . Hypertension Father   . Clotting disorder Brother     Social History Social History   Tobacco Use  . Smoking status: Former Smoker    Years: 4.00    Types: Pipe    Last attempt to quit: 05/24/1983    Years since quitting: 34.0  . Smokeless tobacco: Never Used  . Tobacco comment: smoked a pipe for 4 yrs- quit 1985  Substance Use Topics  . Alcohol use: No    Alcohol/week: 0.0 oz  . Drug use: No    Review of Systems Constitutional: No fever/chills Eyes: No visual changes. ENT: No sore throat. Cardiovascular: Denies chest pain. Respiratory: Denies shortness of breath. Gastrointestinal: No abdominal pain.  No nausea, no vomiting.  No diarrhea.   Genitourinary: Negative for dysuria. Musculoskeletal: Positive for right shoulder pain, right arm spasms Skin: Negative for rash. Neurological: Negative for headaches, focal weakness or numbness.  ____________________________________________   PHYSICAL EXAM:  VITAL SIGNS: ED Triage Vitals  Enc Vitals Group     BP 05/25/17 1952 (!) 157/106     Pulse Rate 05/25/17 1952 68     Resp 05/25/17 1952 17     Temp 05/25/17 1952 97.6 F (36.4 C)     Temp Source 05/25/17 1952 Oral     SpO2 05/25/17 1952 97 %     Weight 05/25/17 1952 252 lb (114.3 kg)     Height 05/25/17 1952 5' 10.5" (1.791 m)     Head Circumference --      Peak Flow --      Pain Score 05/25/17 1951 7    Constitutional: Alert and oriented. Well appearing and in no distress. Eyes: Conjunctivae are normal.  ENT   Head: Normocephalic and atraumatic.   Nose: No congestion/rhinnorhea.   Mouth/Throat: Mucous membranes are moist.   Neck: No stridor. Hematological/Lymphatic/Immunilogical: No cervical lymphadenopathy. Cardiovascular: Normal rate, regular  rhythm.  No murmurs, rubs, or gallops.  Respiratory: Normal respiratory effort without tachypnea nor retractions. Breath sounds are clear and equal bilaterally. No wheezes/rales/rhonchi. Gastrointestinal: Soft and non tender. No rebound. No guarding.  Genitourinary: Deferred Musculoskeletal: Normal range of motion in all extremities. No lower extremity edema. Neurologic:  Normal speech and language. No gross focal neurologic deficits are appreciated.  Skin:  Skin is warm, dry and intact. No rash noted. Psychiatric: Mood and affect are normal. Speech and behavior are normal. Patient exhibits appropriate insight and judgment.  ____________________________________________    LABS (pertinent positives/negatives)  CK 281 CBC wnl except RDW 15.2 BMP k 3.0, cr 1.37  ____________________________________________   EKG  I, Nance Pear, attending physician, personally viewed and interpreted this  EKG  EKG Time: 0036 Rate: 57 Rhythm: sinus bradycardia Axis: left axis deviation Intervals: qtc 415 QRS: LAFB, q waves III ST changes: no st elevation Impression: abnormal ekg   ____________________________________________    RADIOLOGY  None  ____________________________________________   PROCEDURES  Procedures  ____________________________________________   INITIAL IMPRESSION / ASSESSMENT AND PLAN / ED COURSE  Pertinent labs & imaging results that were available during my care of the patient were reviewed by me and considered in my medical decision making (see chart for details).  Patient presents to the emergency department today due to CK of 231. CK here 281 which is within the normal range. Patient not complaining of any chest pain. Doubt cardiac issue at this time. No fevers or leukocytosis to suggest infection after the injection. Did discuss infection precautions with the patient. Will plan on discharging to continue to follow up with  PCP.  ____________________________________________   FINAL CLINICAL IMPRESSION(S) / ED DIAGNOSES  Final diagnoses:  Hypokalemia  Muscle pain     Note: This dictation was prepared with Dragon dictation. Any transcriptional errors that result from this process are unintentional     Nance Pear, MD 05/26/17 [phone]

## 2017-05-26 NOTE — Discharge Instructions (Signed)
Please seek medical attention for any high fevers, chest pain, shortness of breath, change in behavior, persistent vomiting, bloody stool or any other new or concerning symptoms.  

## 2017-09-26 ENCOUNTER — Other Ambulatory Visit: Payer: Self-pay | Admitting: Sports Medicine

## 2017-09-26 DIAGNOSIS — M7541 Impingement syndrome of right shoulder: Secondary | ICD-10-CM

## 2017-10-06 ENCOUNTER — Ambulatory Visit
Admission: RE | Admit: 2017-10-06 | Discharge: 2017-10-06 | Disposition: A | Discharge status: Home / Self Care | Payer: Medicare Other | Source: Ambulatory Visit | Attending: Sports Medicine | Admitting: Sports Medicine

## 2017-10-06 DIAGNOSIS — M25419 Effusion, unspecified shoulder: Secondary | ICD-10-CM | POA: Diagnosis not present

## 2017-10-06 DIAGNOSIS — G8929 Other chronic pain: Secondary | ICD-10-CM | POA: Insufficient documentation

## 2017-10-06 DIAGNOSIS — M25511 Pain in right shoulder: Secondary | ICD-10-CM | POA: Insufficient documentation

## 2017-10-06 DIAGNOSIS — M7551 Bursitis of right shoulder: Secondary | ICD-10-CM | POA: Insufficient documentation

## 2017-10-06 DIAGNOSIS — M7541 Impingement syndrome of right shoulder: Secondary | ICD-10-CM | POA: Insufficient documentation

## 2018-07-10 DIAGNOSIS — M48061 Spinal stenosis, lumbar region without neurogenic claudication: Secondary | ICD-10-CM | POA: Insufficient documentation

## 2018-07-10 DIAGNOSIS — M47816 Spondylosis without myelopathy or radiculopathy, lumbar region: Secondary | ICD-10-CM | POA: Insufficient documentation

## 2019-01-30 ENCOUNTER — Encounter (HOSPITAL_COMMUNITY): Payer: Self-pay

## 2019-01-30 ENCOUNTER — Emergency Department (HOSPITAL_COMMUNITY): Payer: No Typology Code available for payment source

## 2019-01-30 ENCOUNTER — Other Ambulatory Visit: Payer: Self-pay

## 2019-01-30 ENCOUNTER — Emergency Department (HOSPITAL_COMMUNITY)
Admission: EM | Admit: 2019-01-30 | Discharge: 2019-01-30 | Disposition: A | Discharge status: Home / Self Care | Payer: No Typology Code available for payment source | Attending: Emergency Medicine | Admitting: Emergency Medicine

## 2019-01-30 DIAGNOSIS — S60511A Abrasion of right hand, initial encounter: Secondary | ICD-10-CM | POA: Diagnosis not present

## 2019-01-30 DIAGNOSIS — Z7901 Long term (current) use of anticoagulants: Secondary | ICD-10-CM | POA: Insufficient documentation

## 2019-01-30 DIAGNOSIS — S638X1A Sprain of other part of right wrist and hand, initial encounter: Secondary | ICD-10-CM | POA: Insufficient documentation

## 2019-01-30 DIAGNOSIS — I2699 Other pulmonary embolism without acute cor pulmonale: Secondary | ICD-10-CM | POA: Diagnosis not present

## 2019-01-30 DIAGNOSIS — W010XXA Fall on same level from slipping, tripping and stumbling without subsequent striking against object, initial encounter: Secondary | ICD-10-CM | POA: Insufficient documentation

## 2019-01-30 DIAGNOSIS — I10 Essential (primary) hypertension: Secondary | ICD-10-CM | POA: Insufficient documentation

## 2019-01-30 DIAGNOSIS — Z87891 Personal history of nicotine dependence: Secondary | ICD-10-CM | POA: Diagnosis not present

## 2019-01-30 DIAGNOSIS — Z79899 Other long term (current) drug therapy: Secondary | ICD-10-CM | POA: Diagnosis not present

## 2019-01-30 DIAGNOSIS — Y92481 Parking lot as the place of occurrence of the external cause: Secondary | ICD-10-CM | POA: Insufficient documentation

## 2019-01-30 DIAGNOSIS — Y999 Unspecified external cause status: Secondary | ICD-10-CM | POA: Insufficient documentation

## 2019-01-30 DIAGNOSIS — S60519A Abrasion of unspecified hand, initial encounter: Secondary | ICD-10-CM

## 2019-01-30 DIAGNOSIS — M25551 Pain in right hip: Secondary | ICD-10-CM | POA: Diagnosis not present

## 2019-01-30 DIAGNOSIS — S39012A Strain of muscle, fascia and tendon of lower back, initial encounter: Secondary | ICD-10-CM | POA: Diagnosis not present

## 2019-01-30 DIAGNOSIS — Y9301 Activity, walking, marching and hiking: Secondary | ICD-10-CM | POA: Diagnosis not present

## 2019-01-30 DIAGNOSIS — S63501A Unspecified sprain of right wrist, initial encounter: Secondary | ICD-10-CM

## 2019-01-30 DIAGNOSIS — S60512A Abrasion of left hand, initial encounter: Secondary | ICD-10-CM | POA: Insufficient documentation

## 2019-01-30 MED ORDER — ACETAMINOPHEN 500 MG PO TABS
1000.0000 mg | ORAL_TABLET | Freq: Once | ORAL | Status: AC
  Administered 2019-01-30: 1000 mg via ORAL
  Filled 2019-01-30: qty 2

## 2019-01-30 MED ORDER — METHOCARBAMOL 500 MG PO TABS: 500.0000 mg | ORAL_TABLET | Freq: Three times a day (TID) | ORAL | 0 refills | Status: DC | PRN

## 2019-01-30 MED ORDER — TRAMADOL HCL 50 MG PO TABS
50.0000 mg | ORAL_TABLET | Freq: Once | ORAL | Status: AC
  Administered 2019-01-30: 50 mg via ORAL
  Filled 2019-01-30: qty 1

## 2019-01-30 MED ORDER — BACITRACIN ZINC 500 UNIT/GM EX OINT
TOPICAL_OINTMENT | Freq: Two times a day (BID) | CUTANEOUS | Status: DC
  Administered 2019-01-30: 1 via TOPICAL
  Filled 2019-01-30: qty 1.8
  Filled 2019-01-30: qty 0.9

## 2019-01-30 NOTE — Discharge Instructions (Signed)
You may take Tylenol in addition to the Robaxin as needed for muscle aches.  Follow-up with your primary physician.

## 2019-01-30 NOTE — ED Triage Notes (Addendum)
Per EMS, Pt was walking across a dirt and gravel path between 2 parking lots, slipped, and fell forward.  Pt c/o abrasions to bilateral hands and low back pain.  No LOC.  Pt reports having a procedure on back around June 12th.

## 2019-02-05 NOTE — ED Provider Notes (Signed)
[facility] DEPT Provider Note   CSN: [id number] Arrival date & time: 01/30/19  1425     History   Chief Complaint Chief Complaint  Patient presents with  . Fall  . Hand Pain  . Back Pain    HPI [name] Whitaker is a 68 y.o. male.     HPI Patient is on Xarelto had a slip and fall today while walking across a parking lot.  Landed onto both outstretched hands.  Denies hitting his head.  Denies loss of consciousness.  Complaining of abrasions to the bilateral palms and pain to the right wrist.  Patient has some mild low back pain.  States the pain radiates to his right hip. No focal weakness or numbness. Past Medical History:  Diagnosis Date  . Gout   . Hypercholesteremia   . Hypertension   . Migraine   . OSA (obstructive sleep apnea)    on CPAP   . Pulmonary embolism [facility])     Patient Active Problem List   Diagnosis Date Noted  . Bilateral calf pain 08/02/2014  . Dyspnea 06/27/2014  . Chest pain 09/27/2013  . Abnormal ECG 09/27/2013  . Dilated aortic root ([place]) 09/27/2013  . Pure hypercholesterolemia 09/27/2013  . DVT, lower extremity ([place]) 10/27/2011  . OSA on CPAP 10/27/2011  . Pulmonary embolism ([place]) 10/25/2011  . HTN (hypertension) 10/25/2011  . Hyperlipidemia 10/25/2011  . Gout 10/25/2011    Past Surgical History:  Procedure Laterality Date  . APPENDECTOMY    . BACK SURGERY    . CARDIAC CATHETERIZATION    . NASAL SEPTUM SURGERY    . TESTICLE SURGERY    . TONSILLECTOMY          Home Medications    Prior to Admission medications   Medication Sig Start Date End Date Taking? Authorizing Provider  acetaminophen (TYLENOL) 325 MG tablet Take 650 mg by mouth every 6 (six) hours as needed for mild pain.     [provider]  allopurinol (ZYLOPRIM) 300 MG tablet Take 300 mg by mouth daily.    [provider]  atorvastatin (LIPITOR) 40 MG tablet Take 1 tablet (40 mg total) by mouth daily. 09/27/13   Jerline Pain, MD  EPINEPHrine 0.3 mg/0.3 mL IJ SOAJ injection Inject 0.3 mg into the muscle as needed.    [provider]  famotidine (PEPCID) 20 MG tablet One at bedtime 06/27/14   Tanda Rockers, MD  finasteride (PROSCAR) 5 MG tablet Take 5 mg by mouth daily.    [provider]  fluticasone (FLONASE) 50 MCG/ACT nasal spray Place 1 spray into both nostrils daily as needed for allergies.     [provider]  gabapentin (NEURONTIN) 400 MG capsule Take 800 mg by mouth 2 (two) times daily.    [provider]  glipiZIDE (GLUCOTROL) 5 MG tablet Take 5 mg by mouth daily before breakfast.    [provider]  hydrochlorothiazide (MICROZIDE) 12.5 MG capsule Take 12.5 mg by mouth daily.    [provider]  ketoconazole (NIZORAL) 2 % cream Apply 1 application topically daily as needed for irritation (feet).  10/07/13   [provider]  loratadine (CLARITIN) 10 MG tablet Take 10 mg by mouth daily.    [provider]  losartan (COZAAR) 100 MG tablet Take 100 mg by mouth daily.    [provider]  methocarbamol (ROBAXIN) 500 MG tablet Take 1 tablet (500 mg total) by mouth every 8 (eight)  hours as needed for muscle spasms. 01/30/19   Julianne Rice, MD  pantoprazole (PROTONIX) 40 MG tablet Take 1 tablet (40 mg total) by mouth daily. Take 30-60 min before first meal of the day 06/27/14   Tanda Rockers, MD  potassium chloride SA (K-DUR,KLOR-CON) 20 MEQ tablet Take 1 tab daily 09/06/13   [provider]  propranolol (INDERAL) 60 MG tablet Take 60 mg by mouth 2 (two) times daily.    [provider]  rivaroxaban (XARELTO) 20 MG TABS tablet Take 20 mg by mouth every morning.    [provider]  traMADol (ULTRAM) 50 MG tablet Take 50 mg by mouth every 6 (six) hours as needed for moderate pain.    [provider]    Family History Family History  Problem Relation Age of Onset  . Hypertension Mother   . Hypertension  Father   . Clotting disorder Brother     Social History Social History   Tobacco Use  . Smoking status: Former Smoker    Years: 4.00    Types: Pipe    Quit date: 05/24/1983    Years since quitting: 35.7  . Smokeless tobacco: Never Used  . Tobacco comment: smoked a pipe for 4 yrs- quit 1985  Substance Use Topics  . Alcohol use: No    Alcohol/week: 0.0 standard drinks  . Drug use: No     Allergies   Penicillins and Amlodipine   Review of Systems Review of Systems  Constitutional: Negative for chills and fever.  Eyes: Negative for visual disturbance.  Respiratory: Negative for cough and shortness of breath.   Cardiovascular: Negative for chest pain.  Gastrointestinal: Negative for abdominal pain, nausea and vomiting.  Genitourinary: Negative for flank pain, frequency and hematuria.  Musculoskeletal: Positive for arthralgias, back pain, joint swelling and myalgias. Negative for neck pain and neck stiffness.  Skin: Positive for wound.  Neurological: Negative for dizziness, syncope, weakness, light-headedness, numbness and headaches.  All other systems reviewed and are negative.    Physical Exam Updated Vital Signs BP (!) 153/108 (BP Location: Left Arm) Comment: Simultaneous filing. User may not have seen previous data.  Pulse (!) 57   Temp 98.8 F (37.1 C) (Oral)   Resp 16   SpO2 99%   Physical Exam Vitals signs and nursing note reviewed.  Constitutional:      General: He is not in acute distress.    Appearance: Normal appearance. He is well-developed. He is not ill-appearing.  HENT:     Head: Normocephalic and atraumatic.     Comments: No obvious head trauma.    Mouth/Throat:     Mouth: Mucous membranes are moist.  Eyes:     Pupils: Pupils are equal, round, and reactive to light.  Neck:     Musculoskeletal: Normal range of motion and neck supple.     Comments: No posterior midline cervical tenderness to palpation. Cardiovascular:     Rate and Rhythm: Normal  rate and regular rhythm.     Heart sounds: No murmur. No friction rub. No gallop.   Pulmonary:     Effort: Pulmonary effort is normal. No respiratory distress.     Breath sounds: Normal breath sounds. No stridor. No wheezing, rhonchi or rales.  Chest:     Chest wall: No tenderness.  Abdominal:     General: Bowel sounds are normal.     Palpations: Abdomen is soft.     Tenderness: There is no abdominal tenderness. There is no guarding  or rebound.  Musculoskeletal: Normal range of motion.        General: Tenderness present. No swelling, deformity or signs of injury.     Right lower leg: No edema.     Left lower leg: No edema.     Comments: Patient with mild swelling and tenderness to palpation to the right wrist.  No snuffbox tenderness.  Right lumbar paraspinal tenderness to palpation.  This tenderness extends to the lateral surface of the right hip.  Patient has full range of motion of hips bilaterally.  No extremity shortening or malrotation.  Distal pulses are 2+.  Skin:    General: Skin is warm and dry.     Findings: No erythema or rash.     Comments: Superficial abrasions to the bilateral palms.  No active bleeding.  Neurological:     General: No focal deficit present.     Mental Status: He is alert and oriented to person, place, and time.     Comments: Patient is alert and oriented x3 with clear, goal oriented speech. Patient has 5/5 motor in all extremities. Sensation is intact to light touch. Patient has a normal gait and walks without assistance.  Psychiatric:        Behavior: Behavior normal.      ED Treatments / Results  Labs (all labs ordered are listed, but only abnormal results are displayed) Labs Reviewed - No data to display  EKG None  Radiology No results found.  Procedures Procedures (including critical care time)  Medications Ordered in ED Medications  traMADol (ULTRAM) tablet 50 mg (50 mg Oral Given 01/30/19 1943)  acetaminophen (TYLENOL) tablet 1,000 mg  (1,000 mg Oral Given 01/30/19 1943)     Initial Impression / Assessment and Plan / ED Course  I have reviewed the triage vital signs and the nursing notes.  Pertinent labs & imaging results that were available during my care of the patient were reviewed by me and considered in my medical decision making (see chart for details).        Wounds were cleaned and antibiotic ointment and dressings applied.  Patient placed in splint for likely right wrist sprain.  Will treat symptomatically.  Return precautions given.  Final Clinical Impressions(s) / ED Diagnoses   Final diagnoses:  Abrasion of hand, unspecified laterality, initial encounter  Right wrist sprain, initial encounter  Strain of lumbar region, initial encounter  Acute hip pain, right    ED Discharge Orders         Ordered    methocarbamol (ROBAXIN) 500 MG tablet  Every 8 hours PRN     01/30/19 1946           Julianne Rice, MD 02/05/19 1717

## 2019-04-12 ENCOUNTER — Telehealth: Payer: Self-pay

## 2019-04-14 DIAGNOSIS — Z86711 Personal history of pulmonary embolism: Secondary | ICD-10-CM

## 2019-04-14 DIAGNOSIS — Z86718 Personal history of other venous thrombosis and embolism: Secondary | ICD-10-CM | POA: Insufficient documentation

## 2019-04-14 DIAGNOSIS — Z79899 Other long term (current) drug therapy: Secondary | ICD-10-CM | POA: Insufficient documentation

## 2019-04-14 DIAGNOSIS — M899 Disorder of bone, unspecified: Secondary | ICD-10-CM | POA: Insufficient documentation

## 2019-04-14 DIAGNOSIS — Z789 Other specified health status: Secondary | ICD-10-CM | POA: Insufficient documentation

## 2019-04-14 DIAGNOSIS — G894 Chronic pain syndrome: Secondary | ICD-10-CM | POA: Insufficient documentation

## 2019-04-14 HISTORY — DX: Personal history of pulmonary embolism: Z86.711

## 2019-04-14 HISTORY — DX: Personal history of other venous thrombosis and embolism: Z86.718

## 2019-04-14 NOTE — Progress Notes (Signed)
Patient's Name: [name] Whitaker  MRN: [id number]  Referring Provider: [facility]  DOB: 04/23/1951  PCP: Leighton Ruff, MD  DOS: 04/15/2019  Note by: Gaspar Cola, MD  Service setting: Ambulatory outpatient  Specialty: Interventional Pain Management  Location: [facility] (AMB) Pain Management Facility  Visit type: Initial Patient Evaluation  Patient type: New Patient   Primary Reason(s) for Visit: Encounter for initial evaluation of one or more chronic problems (new to examiner) potentially causing chronic pain, and posing a threat to normal musculoskeletal function. (Level of risk: High) CC: Back Pain  HPI  [name] is a 68 y.o. year old, male patient, who comes today to see [place] for the first time for an initial evaluation of his chronic pain. 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; Spondylosis of lumbar region without myelopathy or radiculopathy; Obstructive sleep apnea (adult) (pediatric); Chronic pain syndrome; Pharmacologic therapy; Disorder of skeletal system; Problems influencing health status; Abnormal MRI, lumbar spine (2014); Lumbar facet hypertrophy (Multilevel) ; Lumbar foraminal 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 Today he comes in for evaluation of his Back  Pain  Pain Assessment: Location: Right, Lower Back Radiating: pain radiaties down right hip to mid thigh Onset: More than a month ago Duration: Chronic pain Quality: Throbbing, Sharp, Nagging Severity: 6 /10 (subjective, self-reported pain score)  Note: Reported level is compatible with observation.                         When using our objective Pain Scale, levels between 6 and 10/10 are said to belong in an emergency room, as it progressively worsens from a 6/10, described as severely limiting, requiring emergency care not usually available at an outpatient pain management facility. At a 6/10 level, communication becomes difficult and requires great effort. Assistance to reach the emergency department may be required. Facial flushing and profuse sweating along with potentially dangerous increases in heart rate and blood pressure will be evident. Effect on ADL: limits my daily actvities Timing: Intermittent Modifying factors: nothing BP: 123/82  HR: 76  Onset and Duration: Sudden and Date of injury: 01/30/19 Cause of pain: fall Severity: Getting worse, NAS-11 at its worse: 10/10, NAS-11 at its best: 0/10, NAS-11 now: 7/10 and NAS-11 on the average: 6/10 Timing: Not influenced by the time of the day Aggravating Factors: Bending, Kneeling, Lifiting, Motion, Prolonged sitting, Prolonged standing, Twisting and Walking Alleviating Factors: nothing Associated Problems: Spasms and Pain that wakes patient up Quality of Pain: Nagging, Sharp, Shooting and Throbbing Previous Examinations or Tests: Orthopedic evaluation and Chiropractic evaluation Previous Treatments: Chiropractic manipulations and Steroid treatments by mouth  The patient comes into the clinics today for the first time for a chronic pain management evaluation.  According to the patient everything started around September 9 when he experienced a fall, after which he started having pain in his lower back and hip on the right side.  He  refers to  the hip as the area of his right iliac crest and PSIS area.  He tells me that he was told that it was in the area of the SI joint, which I agree.  He describes his pain as sharp shooting sensation and he has been to the chiropractor for the past 2 weeks.  He was referred to physical therapy but he has not completed that yet.  He denies any recent x-rays but he refers that in the past he has had multiple radiofrequency ablations which usually provide him with up to 1 year of pain relief.  He indicated that the very first 1 was done by Dr. Park Breed, but he refers that Dr. Wilford Grist has retired since.  Subsequent radiofrequencies apparently were done by Dr. Amalia Hailey and Dr. Mindi Curling, neurosurgeons from [facility] health neurosurgery.  He refers that he was being treated at the [facility] in [facility], but since they have moved and now they are out of his range and network.  He comes in today to see if we can take him as a patient and begin treatment of his current pain.  He refers having been given a steroid pack when he had his injury, but this did not help.  He has a prior history of lumbar spinal stenosis and foraminal stenosis based on a prior MRI found in his electronic medical record.  He denies having had any back surgeries.  Today the patient was able to toe walk and heel walk, with some discomfort, but no apparent weakness.  He was able to bend down to approximately 2 feet from the floor.  He was able to also do straight leg raises, bilaterally, with some discomfort on the right lower back upon doing the right side, but no radicular symptoms or sciatica.  He has some tenderness to palpation over the right lower back around the PSIS area.  Patrick maneuver was positive on the right side for pain coming from the SI joint as well as the hip joint.  The patient also presented with decreased range of motion of the right hip.  Today I took the time to provide the patient with  information regarding my pain practice. The patient was informed that my practice is divided into two sections: an interventional pain management section, as well as a completely separate and distinct medication management section. I explained that I have procedure days for my interventional therapies, and evaluation days for follow-ups and medication management. Because of the amount of documentation required during both, they are kept separated. This means that there is the possibility that he may be scheduled for a procedure on one day, and medication management the next. I have also informed him that because of staffing and facility limitations, I no longer take patients for medication management only. To illustrate the reasons for this, I gave the patient the example of surgeons, and how inappropriate it would be to refer a patient to his/her care, just to write for the post-surgical antibiotics on a surgery done by a different surgeon.   Because interventional pain management is my board-certified specialty, the patient was informed that joining my practice means that they are open to any and all interventional therapies. I made it clear that this does not mean that they will be forced to have any procedures done. What this means is that I believe interventional therapies to be essential part of the diagnosis and proper management of chronic pain conditions. Therefore, patients not interested in  these interventional alternatives will be better served under the care of a different practitioner.  The patient was also made aware of my Comprehensive Pain Management Safety Guidelines where by joining my practice, they limit all of their nerve blocks and joint injections to those done by our practice, for as long as we are retained to manage their care.   Historic Controlled Substance Pharmacotherapy Review  PMP and historical list of controlled substances: Hydrocodone/APAP 5/325 #16 prescribed on 08/31/2017  by Nicki Reaper R. Rehm, DDS (26.67 MME) Highest opioid analgesic regimen found: Hydrocodone/APAP 5/325 #16 prescribed on 08/31/2017 by Nicki Reaper R. Rehm, DDS (26.67 MME)  Most recent opioid analgesic: Hydrocodone/APAP 5/325 #16 prescribed on 08/31/2017 by Nicki Reaper R. Rehm, DDS (26.67 MME)  Current opioid analgesics:  None Highest recorded MME/day: 26.67 mg/day MME/day: 0 mg/day  Medications: The patient did not bring the medication(s) to the appointment, as requested in our "New Patient Package" Pharmacodynamics: Desired effects: Analgesia: The patient reports >50% benefit. Reported improvement in function: The patient reports medication allows him to accomplish basic ADLs. Clinically meaningful improvement in function (CMIF): Sustained CMIF goals met Perceived effectiveness: Described as relatively effective, allowing for increase in activities of daily living (ADL) Undesirable effects: Side-effects or Adverse reactions: None reported Historical Monitoring: The patient  reports no history of drug use. List of all UDS Test(s): No results found. List of other Serum/Urine Drug Screening Test(s):  No results found. Historical Background Evaluation: [place] PMP: PDMP reviewed during this encounter. Six (6) year initial data search conducted.             PMP NARX Score Report:  Narcotic: 020 Sedative: 010 Stimulant: 000 [place] Department of public safety, offender search: Editor, commissioning Information) Non-contributory Risk Assessment Profile: Aberrant behavior: None observed or detected today Risk factors for fatal opioid overdose: None identified today PMP NARX Overdose Risk Score: 110 Fatal overdose hazard ratio (HR): Calculation deferred Non-fatal overdose hazard ratio (HR): Calculation deferred Risk of opioid abuse or dependence: 0.7-3.0% with doses ? 36 MME/day and 6.1-26% with doses ? 120 MME/day. Substance use disorder (SUD) risk level: See below Personal History of Substance Abuse (SUD-Substance use  disorder):  Alcohol: Negative  Illegal Drugs: Negative  Rx Drugs: Negative  ORT Risk Level calculation: Low Risk Opioid Risk Tool - 04/15/19 1200      Family History of Substance Abuse   Alcohol  Negative    Illegal Drugs  Negative    Rx Drugs  Negative      Personal History of Substance Abuse   Alcohol  Negative    Illegal Drugs  Negative    Rx Drugs  Negative      Age   Age between 71-45 years   No      History of Preadolescent Sexual Abuse   History of Preadolescent Sexual Abuse  Negative or Male      Psychological Disease   Psychological Disease  Negative    Depression  Negative      Total Score   Opioid Risk Tool Scoring  0    Opioid Risk Interpretation  Low Risk      ORT Scoring interpretation table:  Score <3 = Low Risk for SUD  Score between 4-7 = Moderate Risk for SUD  Score >8 = High Risk for Opioid Abuse   PHQ-2 Depression Scale:  Total score:    PHQ-2 Scoring interpretation table: (Score and probability of major depressive disorder)  Score 0 = No depression  Score 1 = 15.4% Probability  Score 2 = 21.1% Probability  Score 3 = 38.4% Probability  Score 4 = 45.5% Probability  Score 5 = 56.4% Probability  Score 6 = 78.6% Probability   PHQ-9 Depression Scale:  Total score:    PHQ-9 Scoring interpretation table:  Score 0-4 = No depression  Score 5-9 = Mild depression  Score 10-14 = Moderate depression  Score 15-19 = Moderately severe depression  Score 20-27 = Severe depression (2.4 times higher risk of SUD and 2.89 times higher risk of overuse)   Pharmacologic Plan: As per protocol, I have not taken over any controlled substance management, pending the results of ordered tests and/or consults.            Initial impression: Pending review of available data and ordered tests.  Meds   Current Outpatient Medications:  .  acetaminophen (TYLENOL) 325 MG tablet, Take 650 mg by mouth every 6 (six) hours as needed for mild pain. , Disp: , Rfl:  .   allopurinol (ZYLOPRIM) 300 MG tablet, Take 300 mg by mouth daily., Disp: , Rfl:  .  EPINEPHrine 0.3 mg/0.3 mL IJ SOAJ injection, Inject 0.3 mg into the muscle as needed., Disp: , Rfl:  .  famotidine (PEPCID) 20 MG tablet, One at bedtime, Disp: 30 tablet, Rfl: 2 .  finasteride (PROSCAR) 5 MG tablet, Take 5 mg by mouth daily., Disp: , Rfl:  .  gabapentin (NEURONTIN) 400 MG capsule, Take 800 mg by mouth 2 (two) times daily., Disp: , Rfl:  .  glipiZIDE (GLUCOTROL) 5 MG tablet, Take 5 mg by mouth daily before breakfast., Disp: , Rfl:  .  loratadine (CLARITIN) 10 MG tablet, Take 10 mg by mouth daily., Disp: , Rfl:  .  losartan (COZAAR) 100 MG tablet, Take 100 mg by mouth daily., Disp: , Rfl:  .  methocarbamol (ROBAXIN) 500 MG tablet, Take 1 tablet (500 mg total) by mouth every 8 (eight) hours as needed for muscle spasms., Disp: 30 tablet, Rfl: 0 .  pantoprazole (PROTONIX) 40 MG tablet, Take 1 tablet (40 mg total) by mouth daily. Take 30-60 min before first meal of the day, Disp: 30 tablet, Rfl: 2 .  potassium chloride SA (K-DUR,KLOR-CON) 20 MEQ tablet, Take 1 tab daily, Disp: , Rfl:  .  propranolol (INDERAL) 60 MG tablet, Take 60 mg by mouth 2 (two) times daily., Disp: , Rfl:  .  rivaroxaban (XARELTO) 20 MG TABS tablet, Take 20 mg by mouth every morning., Disp: , Rfl:  .  traMADol (ULTRAM) 50 MG tablet, Take 50 mg by mouth every 6 (six) hours as needed for moderate pain., Disp: , Rfl:  .  atorvastatin (LIPITOR) 40 MG tablet, Take 1 tablet (40 mg total) by mouth daily. (Patient not taking: Reported on 04/15/2019), Disp: 30 tablet, Rfl: 3 .  fluticasone (FLONASE) 50 MCG/ACT nasal spray, Place 1 spray into both nostrils daily as needed for allergies. , Disp: , Rfl:  .  hydrochlorothiazide (MICROZIDE) 12.5 MG capsule, Take 12.5 mg by mouth daily., Disp: , Rfl:  .  ketoconazole (NIZORAL) 2 % cream, Apply 1 application topically daily as needed for irritation (feet). , Disp: , Rfl:   Imaging Review  Shoulder  Imaging: Shoulder-R MR wo contrast:  Results for orders placed during the hospital encounter of 10/06/17  MR SHOULDER RIGHT WO CONTRAST   Narrative CLINICAL DATA:  Progressive right shoulder pain with limited range of motion.  EXAM: MRI OF THE RIGHT SHOULDER WITHOUT CONTRAST  TECHNIQUE: Multiplanar, multisequence MR imaging of  the shoulder was performed. No intravenous contrast was administered.  COMPARISON:  None.  FINDINGS: Rotator cuff: Intact supraspinatus, infraspinatus, teres minor and subscapularis tendons.  Muscles: No atrophy or abnormal signal of the muscles of the rotator cuff.  Biceps long head:  Properly located and intact.  Acromioclavicular Joint: Normal. Type 2 acromion. Slight inflammation of the subdeltoid bursa superficial to the bicipital groove.  Glenohumeral Joint: Moderate glenohumeral joint effusion. Chondral defect. No osteophyte formation.  Labrum:  Intact.  Bones:  Normal.  Other: None  IMPRESSION: 1. Slight nonspecific inflammation of the subdeltoid bursa anterior to the bicipital groove of the proximal humerus. The underlying biceps tendon and the adjacent subscapularis tendon appear normal. 2. Moderate nonspecific glenohumeral joint effusion. 3. Normal rotator cuff.   Electronically Signed   By: Lorriane Shire M.D.   On: 10/06/2017 12:36    Lumbosacral Imaging: Lumbar DG (Complete) 4+V:  Results for orders placed during the hospital encounter of 01/30/19  DG Lumbar Spine Complete   Narrative CLINICAL DATA:  Fall, back pain  EXAM: LUMBAR SPINE - COMPLETE 4+ VIEW  COMPARISON:  None.  FINDINGS: Normal lumbar lordosis.  No evidence of fracture or dislocation. Vertebral body heights are maintained.  Mild multilevel degenerative changes with anterior osteophytosis.  Visualized bony pelvis appears intact.  IMPRESSION: Negative.   Electronically Signed   By: Julian Hy M.D.   On: 01/30/2019 19:04           Hip Imaging: Hip-R DG 2-3 views:  Results for orders placed during the hospital encounter of 01/30/19  DG Hip Unilat W or Wo Pelvis 2-3 Views Right   Narrative CLINICAL DATA:  Fall  EXAM: DG HIP (WITH OR WITHOUT PELVIS) 2-3V RIGHT  COMPARISON:  None.  FINDINGS: No fracture or dislocation is seen.  Bilateral hip joint spaces are preserved.  Visualized bony pelvis appears intact.  IMPRESSION: Negative.   Electronically Signed   By: Julian Hy M.D.   On: 01/30/2019 19:04    Wrist Imaging: Wrist-R DG Complete:  Results for orders placed during the hospital encounter of 01/30/19  DG Wrist Complete Right   Narrative CLINICAL DATA:  Fall  EXAM: RIGHT WRIST - COMPLETE 3+ VIEW  COMPARISON:  None.  FINDINGS: There is no evidence of fracture or dislocation. There is no evidence of arthropathy or other focal bone abnormality. Mild dorsal soft tissue swelling.  IMPRESSION: No acute osseous abnormality.   Electronically Signed   By: Prudencio Pair M.D.   On: 01/30/2019 19:04    Complexity Note: Imaging results reviewed. Results shared with Mr. Ghosh, using Layman's terms.                         ROS  Cardiovascular: High blood pressure Pulmonary or Respiratory: No reported pulmonary signs or symptoms such as wheezing and difficulty taking a deep full breath (Asthma), difficulty blowing air out (Emphysema), coughing up mucus (Bronchitis), persistent dry cough, or temporary stoppage of breathing during sleep Neurological: No reported neurological signs or symptoms such as seizures, abnormal skin sensations, urinary and/or fecal incontinence, being born with an abnormal open spine and/or a tethered spinal cord Review of Past Neurological Studies:  Results for orders placed or performed during the hospital encounter of 01/30/19  CT Head Wo Contrast   Narrative   CLINICAL DATA:  Fall  EXAM: CT HEAD WITHOUT CONTRAST  TECHNIQUE: Contiguous axial images were  obtained from the base of the skull through the vertex  without intravenous contrast.  COMPARISON:  None.  FINDINGS: Brain: No evidence of acute infarction, hemorrhage, hydrocephalus, extra-axial collection or mass lesion/mass effect.  Vascular: No hyperdense vessel or unexpected calcification.  Skull: Normal. Negative for fracture or focal lesion.  Sinuses/Orbits: The visualized paranasal sinuses are essentially clear. The mastoid air cells are unopacified.  Other: None.  IMPRESSION: Normal head CT.   Electronically Signed   By: Julian Hy M.D.   On: 01/30/2019 19:05    Psychological-Psychiatric: No reported psychological or psychiatric signs or symptoms such as difficulty sleeping, anxiety, depression, delusions or hallucinations (schizophrenial), mood swings (bipolar disorders) or suicidal ideations or attempts Gastrointestinal: Reflux or heatburn Genitourinary: No reported renal or genitourinary signs or symptoms such as difficulty voiding or producing urine, peeing blood, non-functioning kidney, kidney stones, difficulty emptying the bladder, difficulty controlling the flow of urine, or chronic kidney disease Hematological: No reported hematological signs or symptoms such as prolonged bleeding, low or poor functioning platelets, bruising or bleeding easily, hereditary bleeding problems, low energy levels due to low hemoglobin or being anemic Endocrine: No reported endocrine signs or symptoms such as high or low blood sugar, rapid heart rate due to high thyroid levels, obesity or weight gain due to slow thyroid or thyroid disease Rheumatologic: No reported rheumatological signs and symptoms such as fatigue, joint pain, tenderness, swelling, redness, heat, stiffness, decreased range of motion, with or without associated rash Musculoskeletal: Negative for myasthenia gravis, muscular dystrophy, multiple sclerosis or malignant hyperthermia Work History: Retired  Allergies   Mr. Nghiem is allergic to penicillins and amlodipine.  Laboratory Chemistry Profile   Screening No results found.  Inflammation (CRP: Acute Phase) (ESR: Chronic Phase) No results found.  Rheumatology No results found.  Renal Lab Results  Component Value Date   BUN 15 05/25/2017   CREATININE 1.37 (H) 05/25/2017   GFRAA >60 05/25/2017   GFRNONAA 53 (L) 05/25/2017                             Hepatic Lab Results  Component Value Date   AST 10 09/03/2013   ALT 22 09/03/2013   ALBUMIN 3.9 09/03/2013   ALKPHOS 72 09/03/2013   LIPASE 19 03/15/2007                        Electrolytes Lab Results  Component Value Date   NA 141 05/25/2017   K 3.0 (L) 05/25/2017   CL 102 05/25/2017   CALCIUM 9.0 05/25/2017                        Neuropathy No results found.  CNS No results found.  Bone No results found.  Coagulation Lab Results  Component Value Date   INR 1.41 05/16/2014   LABPROT 17.4 (H) 05/16/2014   APTT 39.4 (H) 11/17/2011   PLT 213 05/25/2017   AT3 98 06/07/2012                        Cardiovascular Lab Results  Component Value Date   BNP 33.8 05/16/2014   CKTOTAL 281 05/25/2017   CKMB 0.9 11/18/2011   TROPONINI <0.30 09/03/2013   HGB 14.6 05/25/2017   HCT 44.5 05/25/2017                         ID No results found.  Cancer No results  found.  Endocrine No results found.  Note: Lab results reviewed.  [place]  Drug: Mr. Renstrom  reports no history of drug use. Alcohol:  reports no history of alcohol use. Tobacco:  reports that he quit smoking about 35 years ago. His smoking use included pipe. He quit after 4.00 years of use. He has never used smokeless tobacco. Medical:  has a past medical history of Gout, Hypercholesteremia, Hypertension, Migraine, OSA (obstructive sleep apnea), and Pulmonary embolism ([place]). Family: family history includes Clotting disorder in his brother; Hypertension in his father and mother.  Past Surgical  History:  Procedure Laterality Date  . APPENDECTOMY    . BACK SURGERY    . CARDIAC CATHETERIZATION    . NASAL SEPTUM SURGERY    . TESTICLE SURGERY    . TONSILLECTOMY     Active Ambulatory Problems    Diagnosis Date Noted  . Pulmonary embolism ([place]) 10/25/2011  . HTN (hypertension) 10/25/2011  . Hyperlipidemia 10/25/2011  . Gout 10/25/2011  . DVT, lower extremity ([place]) 10/27/2011  . OSA on CPAP 10/27/2011  . Chest pain 09/27/2013  . Abnormal ECG 09/27/2013  . Dilated aortic root ([place]) 09/27/2013  . Pure hypercholesterolemia 09/27/2013  . Dyspnea 06/27/2014  . Bilateral calf pain 08/02/2014  . History of DVT (deep vein thrombosis) 04/14/2019  . History of pulmonary embolism 04/14/2019  . Intractable migraine with aura without status migrainosus 06/29/2016  . 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 07/10/2018  . Spondylosis of lumbar region without myelopathy or radiculopathy 07/10/2018  . Obstructive sleep apnea (adult) (pediatric) 06/29/2016  . Chronic pain syndrome 04/14/2019  . Pharmacologic therapy 04/14/2019  . Disorder of skeletal system 04/14/2019  . Problems influencing health status 04/14/2019  . Abnormal MRI, lumbar spine (2014) 04/15/2019  . Lumbar facet hypertrophy (Multilevel)  04/15/2019  . 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 Lumbar facet joint syndrome (Bilateral) (R>L) 04/15/2019  . DDD (degenerative disc disease), lumbosacral 04/15/2019  . Chronic hip pain (Secondary area of Pain) (Right) 04/15/2019  . Chronic low back pain (Primary Area of Pain) (Right) w/o sciatica 04/15/2019  . Chronic sacroiliac joint pain (Right) 04/15/2019  . Other intervertebral disc degeneration, lumbar region 04/15/2019  . Chronic anticoagulation (Xarelto) 04/15/2019   Resolved Ambulatory Problems    Diagnosis Date Noted  . No Resolved Ambulatory Problems   Past Medical History:  Diagnosis Date  . Hypercholesteremia   . Hypertension    . Migraine   . OSA (obstructive sleep apnea)    Constitutional Exam  General appearance: Well nourished, well developed, and well hydrated. In no apparent acute distress Vitals:   04/15/19 1017  BP: 123/82  Pulse: 76  Temp: (!) 97.5 F (36.4 C)  SpO2: 100%  Weight: 240 lb (108.9 kg)  Height: '5\' 11"'$  (1.803 m)   BMI Assessment: Estimated body mass index is 33.47 kg/m as calculated from the following:   Height as of this encounter: '5\' 11"'$  (1.803 m).   Weight as of this encounter: 240 lb (108.9 kg).  BMI interpretation table: BMI level Category Range association with higher incidence of chronic pain  <18 kg/m2 Underweight   18.5-24.9 kg/m2 Ideal body weight   25-29.9 kg/m2 Overweight Increased incidence by 20%  30-34.9 kg/m2 Obese (Class I) Increased incidence by 68%  35-39.9 kg/m2 Severe obesity (Class II) Increased incidence by 136%  >40 kg/m2 Extreme obesity (Class III) Increased incidence by 254%   Patient's current BMI Ideal Body weight  Body mass index is  33.47 kg/m. Ideal body weight: 75.3 kg (166 lb 0.1 oz) Adjusted ideal body weight: 88.7 kg (195 lb 9.7 oz)   BMI Readings from Last 4 Encounters:  04/15/19 33.47 kg/m  05/25/17 35.65 kg/m  10/16/14 36.59 kg/m  08/01/14 37.10 kg/m   Wt Readings from Last 4 Encounters:  04/15/19 240 lb (108.9 kg)  05/25/17 252 lb (114.3 kg)  10/16/14 255 lb (115.7 kg)  08/01/14 266 lb (120.7 kg)  Psych/Mental status: Alert, oriented x 3 (person, place, & time)       Eyes: PERLA Respiratory: No evidence of acute respiratory distress  Cervical Spine Area Exam  Skin & Axial Inspection: No masses, redness, edema, swelling, or associated skin lesions Alignment: Symmetrical Functional ROM: Unrestricted ROM      Stability: No instability detected Muscle Tone/Strength: Functionally intact. No obvious neuro-muscular anomalies detected. Sensory (Neurological): Unimpaired Palpation: No palpable anomalies              Upper  Extremity (UE) Exam    Side: Right upper extremity  Side: Left upper extremity  Skin & Extremity Inspection: Skin color, temperature, and hair growth are WNL. No peripheral edema or cyanosis. No masses, redness, swelling, asymmetry, or associated skin lesions. No contractures.  Skin & Extremity Inspection: Skin color, temperature, and hair growth are WNL. No peripheral edema or cyanosis. No masses, redness, swelling, asymmetry, or associated skin lesions. No contractures.  Functional ROM: Unrestricted ROM          Functional ROM: Unrestricted ROM          Muscle Tone/Strength: Functionally intact. No obvious neuro-muscular anomalies detected.  Muscle Tone/Strength: Functionally intact. No obvious neuro-muscular anomalies detected.  Sensory (Neurological): Unimpaired          Sensory (Neurological): Unimpaired          Palpation: No palpable anomalies              Palpation: No palpable anomalies              Provocative Test(s):  Phalen's test: deferred Tinel's test: deferred Apley's scratch test (touch opposite shoulder):  Action 1 (Across chest): deferred Action 2 (Overhead): deferred Action 3 (LB reach): deferred   Provocative Test(s):  Phalen's test: deferred Tinel's test: deferred Apley's scratch test (touch opposite shoulder):  Action 1 (Across chest): deferred Action 2 (Overhead): deferred Action 3 (LB reach): deferred    Thoracic Spine Area Exam  Skin & Axial Inspection: No masses, redness, or swelling Alignment: Symmetrical Functional ROM: Unrestricted ROM Stability: No instability detected Muscle Tone/Strength: Functionally intact. No obvious neuro-muscular anomalies detected. Sensory (Neurological): Unimpaired Muscle strength & Tone: No palpable anomalies  Lumbar Spine Area Exam  Skin & Axial Inspection: No masses, redness, or swelling Alignment: Symmetrical Functional ROM: Decreased ROM affecting primarily the right Stability: No instability detected Muscle  Tone/Strength: Guarding detected Sensory (Neurological): Movement-associated discomfort Palpation: Complains of area being tender to palpation       Provocative Tests: Hyperextension/rotation test: (+) bilaterally for facet joint pain. Lumbar quadrant test (Kemp's test): (+) on the right for facet joint pain. Lateral bending test: deferred today       Patrick's Maneuver: (+) for right-sided S-I arthralgia and for right hip arthralgia FABER* test: (+) for right-sided S-I arthralgia and for right hip arthralgia S-I anterior distraction/compression test: deferred today         S-I lateral compression test: deferred today         S-I Thigh-thrust test: deferred today  S-I Gaenslen's test: deferred today         *(Flexion, ABduction and External Rotation)  Gait & Posture Assessment  Ambulation: Limited Gait: Antalgic gait (limping) Posture: Difficulty standing up straight, due to pain   Lower Extremity Exam    Side: Right lower extremity  Side: Left lower extremity  Stability: No instability observed          Stability: No instability observed          Skin & Extremity Inspection: Skin color, temperature, and hair growth are WNL. No peripheral edema or cyanosis. No masses, redness, swelling, asymmetry, or associated skin lesions. No contractures.  Skin & Extremity Inspection: Skin color, temperature, and hair growth are WNL. No peripheral edema or cyanosis. No masses, redness, swelling, asymmetry, or associated skin lesions. No contractures.  Functional ROM: Decreased ROM for hip joint Adequate SLR (straight leg raise)  Functional ROM: Unrestricted ROM         Adequate SLR (straight leg raise)  Muscle Tone/Strength: Able to Toe-walk & Heel-walk without problems  Muscle Tone/Strength: Able to Toe-walk & Heel-walk without problems  Sensory (Neurological): Arthropathic arthralgia        Sensory (Neurological): Unimpaired        DTR: Patellar: deferred today Achilles: deferred  today Plantar: deferred today  DTR: Patellar: deferred today Achilles: deferred today Plantar: deferred today  Palpation: Complains of area being tender to palpation  Palpation: No palpable anomalies   Assessment  Primary Diagnosis & Pertinent Problem List: The primary encounter diagnosis was Chronic hip pain (Right). 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  Visit Diagnosis (New problems to examiner): 1. Chronic hip pain (Right)   2. Chronic low back pain (Right) w/o sciatica   3. Chronic sacroiliac joint pain (Right)   4. Lumbar facet joint syndrome   5. Lumbar facet hypertrophy (Multilevel)   6. DDD (degenerative disc disease), lumbosacral   7. Other intervertebral disc degeneration, lumbar region   8. Spondylosis of lumbar region without myelopathy or radiculopathy   9. 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   10. 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 Chronic pain syndrome   12. Abnormal MRI, lumbar spine (2014)   13. Pharmacologic therapy   14. Disorder of skeletal system   15. Problems influencing health status   16. Chronic anticoagulation (Xarelto)    Plan of Care (Initial workup plan)  Note: Mr. Vahle was reminded that as per protocol, today's visit has been an evaluation only. We have not taken over the patient's controlled substance  management.  Problem-specific plan: Lumbar facet joint syndrome (Bilateral) (R>L) Based on the fact that the patient has a known right-sided lumbar facet syndrome, there is a possibility that this may be contributing to his current acute pain.  Chronic hip pain (Secondary area of Pain) (Right) Physical exam today would suggest him to be having problems with his right hip joint and right SI joint.  Today he presents with pain upon performing the Patrick maneuver with decreased range of motion of the right  hip joint.  X-rays ordered today.  Chronic sacroiliac joint pain (Right) Positive Patrick maneuver for right SI joint and right hip joint arthralgia.  X-rays ordered today.   Lab Orders     UDS (Comprehensive-24) ([facility]) ([facility]) (New Pt.)     Comprehensive Metabolic Panel w/GFR     Magnesium     Vitamin B12     Sedimentation rate     Vitamin D (D2+D3)     CRP  Imaging Orders     DG Hip (Right)     DG Sacroiliac Joint X-Rays Referral Orders  No referral(s) requested today    Procedure Orders     Hip injection (Schedule) Pharmacotherapy (current): Medications ordered:  No orders of the defined types were placed in this encounter.  Medications administered during this visit: [name] had no medications administered during this visit.   Pharmacological management options:  Opioid Analgesics: The patient was informed that there is no guarantee that he would be a candidate for opioid analgesics. The decision will be made following CDC guidelines. This decision will be based on the results of diagnostic studies, as well as Mr. Goodchild's risk profile.   Membrane stabilizer: To be determined at a later time  Muscle relaxant: To be determined at a later time  NSAID: To be determined at a later time  Other analgesic(s): To be determined at a later time   Interventional management options: Mr. Hagedorn was informed that there is no guarantee that he would be a  candidate for interventional therapies. The decision will be based on the results of diagnostic studies, as well as Mr. Naron's risk profile.  Procedure(s) under consideration:  NOTE: Xarelto Anticoagulation (Stop: 3 days  Restart: 6 hours) Diagnostic right IA hip joint injection #1  Possible right opturator + femoral nerve block  Possible right opturator + femoral nerve RFA  Diagnostic right SI joint injection #1  Possible right SI joint RFA  Diagnostic right-sided lumbar facet block  Possible right-sided lumbar facet RFA (the patient indicates having had approximately 5 prior RFA)    Provider-requested follow-up: Return for Procedure (w/ sedation): (R) Hip inj #1, (Blood-thinner Protocol).  Future Appointments  Date Time Provider [place]  04/30/2019  8:00 AM Milinda Pointer, MD [facility] None    Primary Care Physician: Leighton Ruff, MD Location: [facility] Note by: Gaspar Cola, MD Date: 04/15/2019; Time: 3:05 PM  Note: This dictation was prepared with Dragon dictation. Any transcriptional errors that may result from this process are unintentional.

## 2019-04-15 ENCOUNTER — Encounter: Payer: Self-pay | Admitting: Pain Medicine

## 2019-04-15 ENCOUNTER — Ambulatory Visit: Payer: No Typology Code available for payment source | Attending: Pain Medicine | Admitting: Pain Medicine

## 2019-04-15 ENCOUNTER — Other Ambulatory Visit: Payer: Self-pay

## 2019-04-15 VITALS — BP 123/82 | HR 76 | Temp 97.5°F | Ht 71.0 in | Wt 240.0 lb

## 2019-04-15 DIAGNOSIS — M5137 Other intervertebral disc degeneration, lumbosacral region: Secondary | ICD-10-CM | POA: Insufficient documentation

## 2019-04-15 DIAGNOSIS — R937 Abnormal findings on diagnostic imaging of other parts of musculoskeletal system: Secondary | ICD-10-CM | POA: Insufficient documentation

## 2019-04-15 DIAGNOSIS — M899 Disorder of bone, unspecified: Secondary | ICD-10-CM | POA: Diagnosis present

## 2019-04-15 DIAGNOSIS — Z789 Other specified health status: Secondary | ICD-10-CM | POA: Insufficient documentation

## 2019-04-15 DIAGNOSIS — M48061 Spinal stenosis, lumbar region without neurogenic claudication: Secondary | ICD-10-CM | POA: Insufficient documentation

## 2019-04-15 DIAGNOSIS — Z7901 Long term (current) use of anticoagulants: Secondary | ICD-10-CM | POA: Insufficient documentation

## 2019-04-15 DIAGNOSIS — M25552 Pain in left hip: Secondary | ICD-10-CM | POA: Insufficient documentation

## 2019-04-15 DIAGNOSIS — G894 Chronic pain syndrome: Secondary | ICD-10-CM | POA: Diagnosis present

## 2019-04-15 DIAGNOSIS — M47816 Spondylosis without myelopathy or radiculopathy, lumbar region: Secondary | ICD-10-CM | POA: Insufficient documentation

## 2019-04-15 DIAGNOSIS — M545 Low back pain, unspecified: Secondary | ICD-10-CM | POA: Insufficient documentation

## 2019-04-15 DIAGNOSIS — M533 Sacrococcygeal disorders, not elsewhere classified: Secondary | ICD-10-CM | POA: Diagnosis present

## 2019-04-15 DIAGNOSIS — G8929 Other chronic pain: Secondary | ICD-10-CM | POA: Diagnosis present

## 2019-04-15 DIAGNOSIS — M5136 Other intervertebral disc degeneration, lumbar region: Secondary | ICD-10-CM | POA: Diagnosis present

## 2019-04-15 DIAGNOSIS — M25551 Pain in right hip: Secondary | ICD-10-CM

## 2019-04-15 DIAGNOSIS — Z79899 Other long term (current) drug therapy: Secondary | ICD-10-CM | POA: Diagnosis present

## 2019-04-15 NOTE — Assessment & Plan Note (Signed)
Based on the fact that the patient has a known right-sided lumbar facet syndrome, there is a possibility that this may be contributing to his current acute pain.

## 2019-04-15 NOTE — Progress Notes (Signed)
Safety precautions to be maintained throughout the outpatient stay will include: orient to surroundings, keep bed in low position, maintain call bell within reach at all times, provide assistance with transfer out of bed and ambulation.  

## 2019-04-15 NOTE — Patient Instructions (Addendum)
____________________________________________________________________________________________  Blood Thinners  Recommended Time Interval Before and After Neuraxial Block or Catheter Removal  Drug (Generic) Brand Name Time Before Time After Comments  Abciximab Reopro 15 days 2 hours   Alteplase Activase 10 days 10 days   Apixaban Eliquis 3 days 6 hours   Aspirin > 325 mg Goody Powders/Excedrin 11 days  (Usually not stopped)  Aspirin ? 81 mg  7 days  (Usually not stopped)  Cholesterol Medication Lipitor 4 days    Cilostazol Pletal 3 days 5 hours   Clopidogrel Plavix 7-10 days 2 hours   Dabigatran Pradaxa 5 days 6 hours   Delteparin Fragmin 24 hours 4 hours   Dipyridamole + ASA Aggrenox 11days 2 hours   Enoxaparin  Lovenox 24 hours 4 hours   Eptifibatide Integrillin 8 hours 2 hours   Fish oil  4 days    Fondaparinux  Arixtra 72 hours 12 hours   Garlic supplements  7 days    Ginkgo biloba  36 hours    Ginseng  24 hours    Heparin (IV)  4 hours 2 hours   Heparin ([place])  12 hours 2 hours   Hydroxychloroquine Plaquenil 11 days    LMW Heparin  24 hours    LMWH  24 hours    NSAIDs  3 days  (Usually not stopped)  Prasugrel Effient 7-10 days 6 hours   Reteplase Retavase 10 days 10 days   Rivaroxaban Xarelto 3 days 6 hours   Streptokinase Streptase 10 days 10 days   Tenecteplase TNKase 10 days 10 days   Thrombolytics  10 days  10 days Avoid x 10 days after inj.  Ticagrelor Brilinta 5-7 days 6 hours   Ticlodipine Ticlid 10-14 days 2 hours   Tinzaparin Innohep 24 hours 4 hours   Tirofiban Aggrastat 8 hours 2 hours   Vitamin E  4 days    Warfarin Coumadin 5 days 2 hours   ____________________________________________________________________________________________  ____________________________________________________________________________________________  Preparing for Procedure with Sedation  Procedure appointments are limited to planned procedures: . No Prescription Refills. . No  disability issues will be discussed. . No medication changes will be discussed.  Instructions: . Oral Intake: Do not eat or drink anything for at least 8 hours prior to your procedure. . Transportation: Public transportation is not allowed. Bring an adult driver. The driver must be physically present in our waiting room before any procedure can be started. Marland Kitchen Physical Assistance: Bring an adult physically capable of assisting you, in the event you need help. This adult should keep you company at home for at least 6 hours after the procedure. . Blood Pressure Medicine: Take your blood pressure medicine with a sip of water the morning of the procedure. . Blood thinners: Notify our staff if you are taking any blood thinners. Depending on which one you take, there will be specific instructions on how and when to stop it. . Diabetics on insulin: Notify the staff so that you can be scheduled 1st case in the morning. If your diabetes requires high dose insulin, take only  of your normal insulin dose the morning of the procedure and notify the staff that you have done so. . Preventing infections: Shower with an antibacterial soap the morning of your procedure. . Build-up your immune system: Take 1000 mg of Vitamin C with every meal (3 times a day) the day prior to your procedure. Marland Kitchen Antibiotics: Inform the staff if you have a condition or reason that requires you to take  antibiotics before dental procedures. . Pregnancy: If you are pregnant, call and cancel the procedure. . Sickness: If you have a cold, fever, or any active infections, call and cancel the procedure. . Arrival: You must be in the facility at least 30 minutes prior to your scheduled procedure. . Children: Do not bring children with you. . Dress appropriately: Bring dark clothing that you would not mind if they get stained. . Valuables: Do not bring any jewelry or valuables.  Reasons to call and reschedule or cancel your procedure:  (Following these recommendations will minimize the risk of a serious complication.) . Surgeries: Avoid having procedures within 2 weeks of any surgery. (Avoid for 2 weeks before or after any surgery). . Flu Shots: Avoid having procedures within 2 weeks of a flu shots or . (Avoid for 2 weeks before or after immunizations). . Barium: Avoid having a procedure within 7-10 days after having had a radiological study involving the use of radiological contrast. (Myelograms, Barium swallow or enema study). . Heart attacks: Avoid any elective procedures or surgeries for the initial 6 months after a "Myocardial Infarction" (Heart Attack). . Blood thinners: It is imperative that you stop these medications before procedures. Let us know if you if you take any blood thinner.  . Infection: Avoid procedures during or within two weeks of an infection (including chest colds or gastrointestinal problems). Symptoms associated with infections include: Localized redness, fever, chills, night sweats or profuse sweating, burning sensation when voiding, cough, congestion, stuffiness, runny nose, sore throat, diarrhea, nausea, vomiting, cold or Flu symptoms, recent or current infections. It is specially important if the infection is over the area that we intend to treat. Marland Kitchen Heart and lung problems: Symptoms that may suggest an active cardiopulmonary problem include: cough, chest pain, breathing difficulties or shortness of breath, dizziness, ankle swelling, uncontrolled high or unusually low blood pressure, and/or palpitations. If you are experiencing any of these symptoms, cancel your procedure and contact your primary care physician for an evaluation.  Remember:  Regular Business hours are:  Monday to Thursday 8:00 AM to 4:00 PM  Provider's Schedule: Milinda Pointer, MD:  Procedure days: Tuesday and Thursday 7:30 AM to 4:00 PM  Gillis Santa, MD:  Procedure days: Monday and Wednesday 7:30 AM to 4:00  PM ____________________________________________________________________________________________   ____________________________________________________________________________________________  General Risks and Possible Complications  Patient Responsibilities: It is important that you read this as it is part of your informed consent. It is our duty to inform you of the risks and possible complications associated with treatments offered to you. It is your responsibility as a patient to read this and to ask questions about anything that is not clear or that you believe was not covered in this document.  Patient's Rights: You have the right to refuse treatment. You also have the right to change your mind, even after initially having agreed to have the treatment done. However, under this last option, if you wait until the last second to change your mind, you may be charged for the materials used up to that point.  Introduction: Medicine is not an Chief Strategy Officer. Everything in Medicine, including the lack of treatment(s), carries the potential for danger, harm, or loss (which is by definition: Risk). In Medicine, a complication is a secondary problem, condition, or disease that can aggravate an already existing one. All treatments carry the risk of possible complications. The fact that a side effects or complications occurs, does not imply that the treatment was conducted incorrectly. It  must be clearly understood that these can happen even when everything is done following the highest safety standards.  No treatment: You can choose not to proceed with the proposed treatment alternative. The "PRO(s)" would include: avoiding the risk of complications associated with the therapy. The "CON(s)" would include: not getting any of the treatment benefits. These benefits fall under one of three categories: diagnostic; therapeutic; and/or palliative. Diagnostic benefits include: getting information which can ultimately  lead to improvement of the disease or symptom(s). Therapeutic benefits are those associated with the successful treatment of the disease. Finally, palliative benefits are those related to the decrease of the primary symptoms, without necessarily curing the condition (example: decreasing the pain from a flare-up of a chronic condition, such as incurable terminal cancer).  General Risks and Complications: These are associated to most interventional treatments. They can occur alone, or in combination. They fall under one of the following six (6) categories: no benefit or worsening of symptoms; bleeding; infection; nerve damage; allergic reactions; and/or death. 1. No benefits or worsening of symptoms: In Medicine there are no guarantees, only probabilities. No healthcare provider can ever guarantee that a medical treatment will work, they can only state the probability that it may. Furthermore, there is always the possibility that the condition may worsen, either directly, or indirectly, as a consequence of the treatment. 2. Bleeding: This is more common if the patient is taking a blood thinner, either prescription or over the counter (example: Goody Powders, Fish oil, Aspirin, Garlic, etc.), or if suffering a condition associated with impaired coagulation (example: Hemophilia, cirrhosis of the liver, low platelet counts, etc.). However, even if you do not have one on these, it can still happen. If you have any of these conditions, or take one of these drugs, make sure to notify your treating physician. 3. Infection: This is more common in patients with a compromised immune system, either due to disease (example: diabetes, cancer, human immunodeficiency virus [HIV], etc.), or due to medications or treatments (example: therapies used to treat cancer and rheumatological diseases). However, even if you do not have one on these, it can still happen. If you have any of these conditions, or take one of these drugs, make  sure to notify your treating physician. 4. Nerve Damage: This is more common when the treatment is an invasive one, but it can also happen with the use of medications, such as those used in the treatment of cancer. The damage can occur to small secondary nerves, or to large primary ones, such as those in the spinal cord and brain. This damage may be temporary or permanent and it may lead to impairments that can range from temporary numbness to permanent paralysis and/or brain death. 5. Allergic Reactions: Any time a substance or material comes in contact with our body, there is the possibility of an allergic reaction. These can range from a mild skin rash (contact dermatitis) to a severe systemic reaction (anaphylactic reaction), which can result in death. 6. Death: In general, any medical intervention can result in death, most of the time due to an unforeseen complication. ____________________________________________________________________________________________

## 2019-04-15 NOTE — Assessment & Plan Note (Signed)
Positive Patrick maneuver for right SI joint and right hip joint arthralgia.  X-rays ordered today.

## 2019-04-15 NOTE — Assessment & Plan Note (Addendum)
Physical exam today would suggest him to be having problems with his right hip joint and right SI joint.  Today he presents with pain upon performing the Patrick maneuver with decreased range of motion of the right hip joint.  X-rays ordered today.

## 2019-04-17 ENCOUNTER — Ambulatory Visit
Admission: RE | Admit: 2019-04-17 | Discharge: 2019-04-17 | Disposition: A | Discharge status: Home / Self Care | Payer: No Typology Code available for payment source | Source: Ambulatory Visit | Attending: Pain Medicine | Admitting: Pain Medicine

## 2019-04-17 ENCOUNTER — Telehealth: Payer: Self-pay

## 2019-04-17 DIAGNOSIS — G8929 Other chronic pain: Secondary | ICD-10-CM | POA: Diagnosis present

## 2019-04-17 DIAGNOSIS — M533 Sacrococcygeal disorders, not elsewhere classified: Secondary | ICD-10-CM | POA: Diagnosis present

## 2019-04-17 DIAGNOSIS — M25551 Pain in right hip: Secondary | ICD-10-CM | POA: Insufficient documentation

## 2019-04-17 NOTE — Progress Notes (Signed)
Dr Leighton Ruff office was called to clear Patient to stop his Xarelto for 3 days for a procedure on 04/30/19. Talk to the nurse and waiting for a respond back.

## 2019-04-21 LAB — MAGNESIUM: Magnesium: 2.2 mg/dL (ref 1.6–2.3)

## 2019-04-21 LAB — COMP. METABOLIC PANEL (12)
AST: 20 IU/L (ref 0–40)
Albumin/Globulin Ratio: 1.6 (ref 1.2–2.2)
Albumin: 4.6 g/dL (ref 3.8–4.8)
Alkaline Phosphatase: 79 IU/L (ref 39–117)
BUN/Creatinine Ratio: 11 (ref 10–24)
BUN: 15 mg/dL (ref 8–27)
Bilirubin Total: 1 mg/dL (ref 0.0–1.2)
Calcium: 9.8 mg/dL (ref 8.6–10.2)
Chloride: 102 mmol/L (ref 96–106)
Creatinine, Ser: 1.36 mg/dL — ABNORMAL HIGH (ref 0.76–1.27)
GFR calc Af Amer: 62 mL/min/{1.73_m2} (ref >59)
GFR calc non Af Amer: 54 mL/min/{1.73_m2} — ABNORMAL LOW (ref >59)
Globulin, Total: 2.9 g/dL (ref 1.5–4.5)
Glucose: 95 mg/dL (ref 65–99)
Potassium: 4 mmol/L (ref 3.5–5.2)
Sodium: 145 mmol/L — ABNORMAL HIGH (ref 134–144)
Total Protein: 7.5 g/dL (ref 6.0–8.5)

## 2019-04-21 LAB — SEDIMENTATION RATE: Sed Rate: 6 mm/hr (ref 0–30)

## 2019-04-21 LAB — 25-HYDROXYVITAMIN D LCMS D2+D3
25-Hydroxy, Vitamin D-2: <1 ng/mL
25-Hydroxy, Vitamin D: 43 ng/mL

## 2019-04-21 LAB — VITAMIN B12: Vitamin B-12: 680 pg/mL (ref 232–1245)

## 2019-04-21 LAB — 25-HYDROXY VITAMIN D LCMS D2+D3: 25-Hydroxy, Vitamin D-3: 43 ng/mL

## 2019-04-21 LAB — C-REACTIVE PROTEIN: CRP: 2 mg/L (ref 0–10)

## 2019-04-22 ENCOUNTER — Telehealth: Payer: Self-pay | Admitting: Pain Medicine

## 2019-04-22 NOTE — Telephone Encounter (Signed)
Looked up who prescribed Xarelto and found it was aPA in [place]. Attempted to called patient, no answer. Could not leave a message-mailbox not set up/full.,

## 2019-04-22 NOTE — Telephone Encounter (Signed)
Sharee Pimple - office that request to stop Xarelto was sent to [facility] stating they did not prescribe this med, that maybe it was his cardiologist. His appt is scheduled for 12-8-

## 2019-04-23 NOTE — Telephone Encounter (Signed)
This patient is scheduled for 04-30-19 procedure appt. May need to call patient and find out where to send request to stop Xarelto? Ask Dr. Dossie Arbour what to do?

## 2019-04-23 NOTE — Telephone Encounter (Signed)
Ok, so what does Dr. Dossie Arbour want to do now? The patient is scheduled for a procedure.

## 2019-04-23 NOTE — Telephone Encounter (Signed)
I did call the patient and could not leave a message. The last MD that prescribed last was [place].

## 2019-04-23 NOTE — Telephone Encounter (Signed)
I am sorry unable to ask Dr Dossie Arbour. He is in office. If we cant get in touch with the patient, then my only thoughts would be that if he comes in we can get the info then and reschedule.

## 2019-04-23 NOTE — Telephone Encounter (Signed)
Dr. Dossie Arbour what do you want to do about this problem. This patient is sched for 12-8 and is still on Xarelto?

## 2019-04-30 ENCOUNTER — Ambulatory Visit (HOSPITAL_BASED_OUTPATIENT_CLINIC_OR_DEPARTMENT_OTHER): Payer: No Typology Code available for payment source | Admitting: Pain Medicine

## 2019-04-30 ENCOUNTER — Ambulatory Visit
Admission: RE | Admit: 2019-04-30 | Discharge: 2019-04-30 | Disposition: A | Discharge status: Home / Self Care | Payer: No Typology Code available for payment source | Source: Ambulatory Visit | Attending: Pain Medicine | Admitting: Pain Medicine

## 2019-04-30 ENCOUNTER — Other Ambulatory Visit: Payer: Self-pay

## 2019-04-30 ENCOUNTER — Encounter: Payer: Self-pay | Admitting: Pain Medicine

## 2019-04-30 VITALS — BP 119/75 | HR 85 | Temp 97.0°F | Resp 18 | Ht 71.0 in | Wt 230.0 lb

## 2019-04-30 DIAGNOSIS — M25551 Pain in right hip: Secondary | ICD-10-CM | POA: Insufficient documentation

## 2019-04-30 DIAGNOSIS — M1611 Unilateral primary osteoarthritis, right hip: Secondary | ICD-10-CM

## 2019-04-30 DIAGNOSIS — Z7901 Long term (current) use of anticoagulants: Secondary | ICD-10-CM | POA: Insufficient documentation

## 2019-04-30 DIAGNOSIS — G8929 Other chronic pain: Secondary | ICD-10-CM

## 2019-04-30 MED ORDER — LIDOCAINE HCL 2 % IJ SOLN
20.0000 mL | Freq: Once | INTRAMUSCULAR | Status: AC
  Administered 2019-04-30: 400 mg
  Filled 2019-04-30: qty 40

## 2019-04-30 MED ORDER — METHYLPREDNISOLONE ACETATE 80 MG/ML IJ SUSP
80.0000 mg | Freq: Once | INTRAMUSCULAR | Status: AC
  Administered 2019-04-30: 80 mg via INTRA_ARTICULAR
  Filled 2019-04-30: qty 1

## 2019-04-30 MED ORDER — IOHEXOL 180 MG/ML  SOLN
10.0000 mL | Freq: Once | INTRAMUSCULAR | Status: AC
  Administered 2019-04-30: 10 mL via INTRA_ARTICULAR
  Filled 2019-04-30: qty 20

## 2019-04-30 MED ORDER — ROPIVACAINE HCL 2 MG/ML IJ SOLN
9.0000 mL | Freq: Once | INTRAMUSCULAR | Status: AC
  Administered 2019-04-30: 9 mL via INTRA_ARTICULAR
  Filled 2019-04-30: qty 10

## 2019-04-30 MED ORDER — LACTATED RINGERS IV SOLN: 1000.0000 mL | Freq: Once | INTRAVENOUS | Status: DC

## 2019-04-30 MED ORDER — FENTANYL CITRATE (PF) 100 MCG/2ML IJ SOLN: 25.0000 ug | INTRAMUSCULAR | Status: DC | PRN

## 2019-04-30 MED ORDER — MIDAZOLAM HCL 5 MG/5ML IJ SOLN: 1.0000 mg | INTRAMUSCULAR | Status: DC | PRN

## 2019-04-30 NOTE — Progress Notes (Signed)
Safety precautions to be maintained throughout the outpatient stay will include: orient to surroundings, keep bed in low position, maintain call bell within reach at all times, provide assistance with transfer out of bed and ambulation.  

## 2019-04-30 NOTE — Patient Instructions (Addendum)

## 2019-04-30 NOTE — Progress Notes (Signed)
Patient's Name: [name] Whitaker  MRN: [id number]  Referring Provider: Leighton Ruff, MD  DOB: 15-Mar-1951  PCP: Leighton Ruff, MD  DOS: 04/30/2019  Note by: Gaspar Cola, MD  Service setting: Ambulatory outpatient  Specialty: Interventional Pain Management  Patient type: Established  Location: [facility] (AMB) Pain Management Facility  Visit type: Interventional Procedure   Primary Reason for Visit: Interventional Pain Management Treatment. CC: Hip Pain (right)  Procedure:          Anesthesia, Analgesia, Anxiolysis:  Type: Intra-Articular Hip Injection #1  Primary Purpose: Diagnostic Region: Posterolateral hip joint area. Level: Lower pelvic and hip joint level. Target Area: Superior aspect of the hip joint cavity, going thru the superior portion of the capsular ligament. Approach: Posterolateral approach. Laterality: Right  Type: Local Anesthesia Indication(s): Analgesia         Route: Infiltration ([place]/IM) IV Access: Declined Sedation: Declined  Local Anesthetic: Lidocaine 1-2%  Position: Lateral Decubitus with bad side up Prepped Area: Entire Posterolateral hip area. Prepping solution: DuraPrep (Iodine Povacrylex [0.7% available iodine] and Isopropyl Alcohol, 74% w/w)   Indications: 1. Chronic hip pain (Secondary area of Pain) (Right)   2. Osteoarthritis of hip (Right)   3. Chronic anticoagulation (Xarelto)    Pain Score: Pre-procedure: 5 /10 Post-procedure: 5 /10   Pre-op Assessment:  [name] Whitaker is a 68 y.o. (year old), male patient, seen today for interventional treatment. He  has a past surgical history that includes Appendectomy; Nasal septum surgery; Tonsillectomy; Testicle surgery; Cardiac catheterization; and Back surgery. [name] Whitaker has a current medication list which includes the following prescription(s): acetaminophen, allopurinol, epinephrine, famotidine, finasteride, fluticasone, gabapentin, glipizide, hydrochlorothiazide, ketoconazole, loratadine,  losartan, methocarbamol, pantoprazole, potassium chloride sa, propranolol, rivaroxaban, tramadol, and atorvastatin. His primarily concern today is the Hip Pain (right)  Initial Vital Signs:  Pulse/HCG Rate: 85ECG Heart Rate: 71 Temp: (!) 97 F (36.1 C) Resp: 16 BP: 98/82 SpO2: 95 %  BMI: Estimated body mass index is 32.08 kg/m as calculated from the following:   Height as of this encounter: 5\' 11"  (1.803 m).   Weight as of this encounter: 230 lb (104.3 kg).  Risk Assessment: Allergies: Reviewed. He is allergic to penicillins and amlodipine.  Allergy Precautions: None required Coagulopathies: Reviewed. None identified.  Blood-thinner therapy: None at this time Active Infection(s): Reviewed. None identified. [name] Whitaker is afebrile  Site Confirmation: [name] Whitaker was asked to confirm the procedure and laterality before marking the site Procedure checklist: Completed Consent: Before the procedure and under the influence of no sedative(s), amnesic(s), or anxiolytics, the patient was informed of the treatment options, risks and possible complications. To fulfill our ethical and legal obligations, as recommended by the American Medical Association's Code of Ethics, I have informed the patient of my clinical impression; the nature and purpose of the treatment or procedure; the risks, benefits, and possible complications of the intervention; the alternatives, including doing nothing; the risk(s) and benefit(s) of the alternative treatment(s) or procedure(s); and the risk(s) and benefit(s) of doing nothing. The patient was provided information about the general risks and possible complications associated with the procedure. These may include, but are not limited to: failure to achieve desired goals, infection, bleeding, organ or nerve damage, allergic reactions, paralysis, and death. In addition, the patient was informed of those risks and complications associated to the procedure, such as failure  to decrease pain; infection; bleeding; organ or nerve damage with subsequent damage to sensory, motor, and/or autonomic systems, resulting in permanent pain, numbness, and/or weakness of  one or several areas of the body; allergic reactions; (i.e.: anaphylactic reaction); and/or death. Furthermore, the patient was informed of those risks and complications associated with the medications. These include, but are not limited to: allergic reactions (i.e.: anaphylactic or anaphylactoid reaction(s)); adrenal axis suppression; blood sugar elevation that in diabetics may result in ketoacidosis or comma; water retention that in patients with history of congestive heart failure may result in shortness of breath, pulmonary edema, and decompensation with resultant heart failure; weight gain; swelling or edema; medication-induced neural toxicity; particulate matter embolism and blood vessel occlusion with resultant organ, and/or nervous system infarction; and/or aseptic necrosis of one or more joints. Finally, the patient was informed that Medicine is not an exact science; therefore, there is also the possibility of unforeseen or unpredictable risks and/or possible complications that may result in a catastrophic outcome. The patient indicated having understood very clearly. We have given the patient no guarantees and we have made no promises. Enough time was given to the patient to ask questions, all of which were answered to the patient's satisfaction. [name] Whitaker has indicated that he wanted to continue with the procedure. Attestation: I, the ordering provider, attest that I have discussed with the patient the benefits, risks, side-effects, alternatives, likelihood of achieving goals, and potential problems during recovery for the procedure that I have provided informed consent. Date  Time: 04/30/2019  8:14 AM  Pre-Procedure Preparation:  Monitoring: As per clinic protocol. Respiration, ETCO2, SpO2, BP, heart rate and  rhythm monitor placed and checked for adequate function Safety Precautions: Patient was assessed for positional comfort and pressure points before starting the procedure. Time-out: I initiated and conducted the "Time-out" before starting the procedure, as per protocol. The patient was asked to participate by confirming the accuracy of the "Time Out" information. Verification of the correct person, site, and procedure were performed and confirmed by me, the nursing staff, and the patient. "Time-out" conducted as per Joint Commission's Universal Protocol (UP.01.01.01). Time: 0852  Description of Procedure:          Safety Precautions: Aspiration looking for blood return was conducted prior to all injections. At no point did we inject any substances, as a needle was being advanced. No attempts were made at seeking any paresthesias. Safe injection practices and needle disposal techniques used. Medications properly checked for expiration dates. SDV (single dose vial) medications used. Description of the Procedure: Protocol guidelines were followed. The patient was placed in position over the fluoroscopy table. The target area was identified and the area prepped in the usual manner. Skin & deeper tissues infiltrated with local anesthetic. Appropriate amount of time allowed to pass for local anesthetics to take effect. The procedure needles were then advanced to the target area. Proper needle placement secured. Negative aspiration confirmed. Solution injected in intermittent fashion, asking for systemic symptoms every 0.5cc of injectate. The needles were then removed and the area cleansed, making sure to leave some of the prepping solution back to take advantage of its long term bactericidal properties. Vitals:   04/30/19 0814 04/30/19 0848 04/30/19 0853 04/30/19 0859  BP: 98/82 126/86 112/81 119/75  Pulse: 85     Resp: 16 20 (!) 23 18  Temp: (!) 97 F (36.1 C)     TempSrc: Temporal     SpO2: 95% 95% 96%  95%  Weight: 230 lb (104.3 kg)     Height: 5\' 11"  (1.803 m)       Start Time: 0852 hrs. End Time:   hrs.  Materials:  Needle(s) Type: Spinal Needle Gauge: 22G Length: 5.0-in Medication(s): Please see orders for medications and dosing details.  Imaging Guidance (Non-Spinal):          Type of Imaging Technique: Fluoroscopy Guidance (Non-Spinal) Indication(s): Assistance in needle guidance and placement for procedures requiring needle placement in or near specific anatomical locations not easily accessible without such assistance. Exposure Time: Please see nurses notes. Contrast: Before injecting any contrast, we confirmed that the patient did not have an allergy to iodine, shellfish, or radiological contrast. Once satisfactory needle placement was completed at the desired level, radiological contrast was injected. Contrast injected under live fluoroscopy. No contrast complications. See chart for type and volume of contrast used. Fluoroscopic Guidance: I was personally present during the use of fluoroscopy. "Tunnel Vision Technique" used to obtain the best possible view of the target area. Parallax error corrected before commencing the procedure. "Direction-depth-direction" technique used to introduce the needle under continuous pulsed fluoroscopy. Once target was reached, antero-posterior, oblique, and lateral fluoroscopic projection used confirm needle placement in all planes. Images permanently stored in EMR. Interpretation: I personally interpreted the imaging intraoperatively. Adequate needle placement confirmed in multiple planes. Appropriate spread of contrast into desired area was observed. No evidence of afferent or efferent intravascular uptake. Permanent images saved into the patient's record.  Antibiotic Prophylaxis:   Anti-infectives (From admission, onward)   None     Indication(s): None identified  Post-operative Assessment:  Post-procedure Vital Signs:  Pulse/HCG Rate:  8570 Temp: (!) 97 F (36.1 C) Resp: 18 BP: 119/75 SpO2: 95 %  EBL: None  Complications: No immediate post-treatment complications observed by team, or reported by patient.  Note: The patient tolerated the entire procedure well. A repeat set of vitals were taken after the procedure and the patient was kept under observation following institutional policy, for this type of procedure. Post-procedural neurological assessment was performed, showing return to baseline, prior to discharge. The patient was provided with post-procedure discharge instructions, including a section on how to identify potential problems. Should any problems arise concerning this procedure, the patient was given instructions to immediately contact us, at any time, without hesitation. In any case, we plan to contact the patient by telephone for a follow-up status report regarding this interventional procedure.  Comments:  No additional relevant information.  Plan of Care  Orders:  Orders Placed This Encounter  Procedures  . Hip injection (Today)    Scheduling Instructions:     Side: Right-sided     Sedation: With Sedation.     Timeframe: Today  . Fluoro (C-Arm) (<60 min) (No Report)    Intraoperative interpretation by procedural physician at [place].    Standing Status:   Standing    Number of Occurrences:   1    Order Specific Question:   Reason for exam:    Answer:   Assistance in needle guidance and placement for procedures requiring needle placement in or near specific anatomical locations not easily accessible without such assistance.  . Consent: Hip inj.    Nursing Order: Transcribe to consent form and obtain patient signature. Note: Always confirm laterality of pain with Mr. Vereb, before procedure. Procedure: Hip injection Indication/Reason: Hip Joint Pain (Arthralgia) Provider Attestation: I, [place] Dossie Arbour, MD, (Pain Management Specialist), the physician/practitioner, attest that  I have discussed with the patient the benefits, risks, side effects, alternatives, likelihood of achieving goals and potential problems during recovery for the procedure that I have provided informed consent.  . Care order/instruction:  Please confirm that the patient has stopped the Xarelto (Rivaroxaban) x 3 days prior to procedure or surgery.    Please confirm that the patient has stopped the Xarelto (Rivaroxaban) x 3 days prior to procedure or surgery.    Standing Status:   Standing    Number of Occurrences:   1  . Block Tray    Equipment required: Single use, disposable, "Block Tray"    Standing Status:   Standing    Number of Occurrences:   1    Order Specific Question:   Specify    Answer:   Block Tray  . Bleeding precautions    Standing Status:   Standing    Number of Occurrences:   1   Chronic Opioid Analgesic:  None Highest recorded MME/day: 26.67 mg/day MME/day: 0 mg/day   Medications ordered for procedure: Meds ordered this encounter  Medications  . iohexol (OMNIPAQUE) 180 MG/ML injection 10 mL    Must be Myelogram-compatible. If not available, you may substitute with a water-soluble, non-ionic, hypoallergenic, myelogram-compatible radiological contrast medium.  . ropivacaine (PF) 2 mg/mL (0.2%) (NAROPIN) injection 9 mL  . methylPREDNISolone acetate (DEPO-MEDROL) injection 80 mg  . lidocaine (XYLOCAINE) 2 % (with pres) injection 400 mg  . DISCONTD: lactated ringers infusion 1,000 mL  . DISCONTD: midazolam (VERSED) 5 MG/5ML injection 1-2 mg    Make sure Flumazenil is available in the pyxis when using this medication. If oversedation occurs, administer 0.2 mg IV over 15 sec. If after 45 sec no response, administer 0.2 mg again over 1 min; may repeat at 1 min intervals; not to exceed 4 doses (1 mg)  . DISCONTD: fentaNYL (SUBLIMAZE) injection 25-50 mcg    Make sure Narcan is available in the pyxis when using this medication. In the event of respiratory depression (RR< 8/min):  Titrate NARCAN (naloxone) in increments of 0.1 to 0.2 mg IV at 2-3 minute intervals, until desired degree of reversal.   Medications administered: We administered iohexol, ropivacaine (PF) 2 mg/mL (0.2%), methylPREDNISolone acetate, and lidocaine.  See the medical record for exact dosing, route, and time of administration.  Follow-up plan:   Return in about 2 weeks (around 05/14/2019) for (VV), (PP).       Interventional treatment options: Under consideration:  NOTE: Xarelto Anticoagulation (Stop: 3 days  Restart: 6 hours) Diagnostic right IA hip joint injection #1  (today-04/30/2019) Possible right opturator + femoral nerve block  Possible right opturator + femoral nerve RFA  Diagnostic right SI joint injection #1  Possible right SI joint RFA  Diagnostic right-sided lumbar facet block  Possible right-sided lumbar facet RFA (the patient indicates having had approximately 5 prior RFA)     Recent Visits Date Type Provider Dept  04/15/19 Office Visit Milinda Pointer, MD [facility]  Showing recent visits within past 90 days and meeting all other requirements   Today's Visits Date Type Provider Dept  04/30/19 Procedure visit Milinda Pointer, MD [facility]  Showing today's visits and meeting all other requirements   Future Appointments Date Type Provider Dept  05/13/19 Appointment Milinda Pointer, MD [facility]  Showing future appointments within next 90 days and meeting all other requirements   Disposition: Discharge home  Discharge Date & Time: 04/30/2019; 0910 hrs.   Primary Care Physician: Leighton Ruff, MD Location: [facility] Note by: Gaspar Cola, MD Date: 04/30/2019; Time: 9:52 AM  Disclaimer:  Medicine is not an Chief Strategy Officer. The only guarantee in medicine is  that nothing is guaranteed. It is important to note that the decision to proceed with this intervention was based on the information  collected from the patient. The Data and conclusions were drawn from the patient's questionnaire, the interview, and the physical examination. Because the information was provided in large part by the patient, it cannot be guaranteed that it has not been purposely or unconsciously manipulated. Every effort has been made to obtain as much relevant data as possible for this evaluation. It is important to note that the conclusions that lead to this procedure are derived in large part from the available data. Always take into account that the treatment will also be dependent on availability of resources and existing treatment guidelines, considered by other Pain Management Practitioners as being common knowledge and practice, at the time of the intervention. For Medico-Legal purposes, it is also important to point out that variation in procedural techniques and pharmacological choices are the acceptable norm. The indications, contraindications, technique, and results of the above procedure should only be interpreted and judged by a Board-Certified Interventional Pain Specialist with extensive familiarity and expertise in the same exact procedure and technique.

## 2019-05-01 ENCOUNTER — Telehealth: Payer: Self-pay

## 2019-05-01 NOTE — Telephone Encounter (Signed)
Attempted to call pt. Mailbox full unable to leave message.

## 2019-05-08 ENCOUNTER — Encounter: Payer: Self-pay | Admitting: Pain Medicine

## 2019-05-08 NOTE — Progress Notes (Signed)
Pain relief after procedure (treated area only): (Questions asked to patient) 1. Starting about 15 minutes after the procedure, and "while the area was still numb" (from the local anesthetics), were you having any of your usual pain "in that area" (the treated area)?  (NOTE: NOT including the discomfort from the needle sticks.) First 1 hour: 100 % better. First 4-6 hours:100 % better.   3. How much better is your pain now, when compared to before the procedure? Current benefit: 50 % better. 4. Can you move better now? Improvement in ROM (Range of Motion): Yes. 5. Can you do more now? Improvement in function: Yes. 4. Did you have any problems with the procedure? Side-effects/Complications: No. 

## 2019-05-12 NOTE — Progress Notes (Signed)
NOTE: Information contained herein reflects provider's review and annotations entered in association with encounter. Patient information is provided elsewhere in the medical record. Interpretation of information and data contained herein should be left to medically trained personnel.  Pain Management Virtual Encounter Note - Virtual Visit via Telephone Telehealth (real-time audio visits between healthcare provider and patient).   Patient's Phone No. & Preferred Pharmacy:  [phone] (home); [phone] (mobile); (Preferred) [phone] [email or web address]  [place], [place] - [address] [address] Phone: [phone] Fax: [phone]    Pre-screening note:  Our staff contacted [name] Whitaker and offered him an "in person", "face-to-face" appointment versus a telephone encounter. He indicated preferring the telephone encounter, at this time.   Reason for Virtual Visit: COVID-19*  Social distancing based on CDC and AMA recommendations.   I contacted [name] Whitaker on 05/13/2019 via telephone.      I clearly identified myself as Gaspar Cola, MD. I verified that I was speaking with the correct person using two identifiers (Name: [name] Whitaker, and date of birth: 1950/11/04).  Advanced Informed Consent I sought verbal advanced consent from [name] Whitaker for virtual visit interactions. I informed [name] Whitaker of possible security and privacy concerns, risks, and limitations associated with providing "not-in-person" medical evaluation and management services. I also informed [name] Whitaker of the availability of "in-person" appointments. Finally, I informed him that there would be a charge for the virtual visit and that he could be  personally, fully or partially, financially responsible for it. [name] Whitaker expressed understanding and agreed to proceed.   Historic Elements   [name] Whitaker is a 68 y.o. year old, male  patient evaluated today after his last encounter by our practice on 05/01/2019. [name] Whitaker  has a past medical history of Gout, Hypercholesteremia, Hypertension, Migraine, OSA (obstructive sleep apnea), and Pulmonary embolism ([place]). He also  has a past surgical history that includes Appendectomy; Nasal septum surgery; Tonsillectomy; Testicle surgery; Cardiac catheterization; and Back surgery. [name] Whitaker has a current medication list which includes the following prescription(s): acetaminophen, allopurinol, epinephrine, famotidine, finasteride, fluticasone, gabapentin, glipizide, hydrochlorothiazide, ketoconazole, loratadine, losartan, methocarbamol, pantoprazole, potassium chloride sa, propranolol, rivaroxaban, and tramadol. He  reports that he quit smoking about 35 years ago. His smoking use included pipe. He quit after 4.00 years of use. He has never used smokeless tobacco. He reports that he does not drink alcohol or use drugs. [name] Whitaker is allergic to penicillins; amlodipine; erenumab-aooe; and lisinopril.   HPI  Today, he is being contacted for a post-procedure assessment.  The patient indicates that he has continued to improve since he had his right intra-articular hip joint injection.  At the time of the prescreening phone call he was only 50% better, but right now he indicates that he is currently pain-free.  In view of this, we will set him up with a PRN procedure, should right hip pain returns.  Post-Procedure Evaluation  Procedure: Diagnostic right-sided IA hip joint injection #1 under fluoroscopic guidance, no sedation. Pre-procedure pain level:  5/10 Post-procedure: 5/10 No initial benefit, possibly due to rapid discharge after no sedation procedure, without enough time to allow full onset of block.  Sedation: None.  [name] Shorter, RN  05/08/2019  9:11 AM  Signed Pain relief after procedure (treated area only): (Questions asked to patient) 1. Starting about 15 minutes after the  procedure, and "while the area was still numb" (from the local anesthetics), were you having any of your usual  pain "in that area" (the treated area)?  (NOTE: NOT including the discomfort from the needle sticks.) First 1 hour:100 % better. First 4-6 hours:100% better.  3. How much better is your pain now, when compared to before the procedure? Current benefit: 50 % better. 4. Can you move better now? Improvement in ROM (Range of Motion): Yes. 5. Can you do more now? Improvement in function: Yes. 4. Did you have any problems with the procedure? Side-effects/Complications: No.  Current benefits: Defined as benefit that persist at this time.   Analgesia:  90-100% better Function: [name] Whitaker reports improvement in function ROM: [name] Whitaker reports improvement in ROM  Pharmacotherapy Assessment  Analgesic: None Highest recorded MME/day: 26.67 mg/day MME/day: 0 mg/day   Monitoring: Pharmacotherapy: No side-effects or adverse reactions reported. [place] PMP: PDMP reviewed during this encounter.       Compliance: No problems identified. Effectiveness: Clinically acceptable. Plan: Refer to "POC".  UDS: No results found for: SUMMARY Laboratory Chemistry Profile (12 mo)  Renal: 04/15/2019: BUN 15; BUN/Creatinine Ratio 11; Creatinine, Ser 1.36  Lab Results  Component Value Date   GFRAA 62 04/15/2019   GFRNONAA 54 (L) 04/15/2019   Hepatic: 04/15/2019: Albumin 4.6 Lab Results  Component Value Date   AST 20 04/15/2019   ALT 22 09/03/2013   Other: 04/15/2019: 25-Hydroxy, Vitamin D 43; 25-Hydroxy, Vitamin D-2 <1.0; 25-Hydroxy, Vitamin D-3 43; CRP 2; Sed Rate 6; Vitamin B-12 680 Note: Above Lab results reviewed.  Imaging  Fluoro (C-Arm) (<60 min) (No Report) Fluoro was used, but no Radiologist interpretation will be provided.  Please refer to "NOTES" tab for provider progress note.  Assessment  The primary encounter diagnosis was Chronic pain syndrome. Diagnoses of Chronic low back  pain (Primary Area of Pain) (Right) w/o sciatica and Chronic hip pain (Secondary area of Pain) (Right) were also pertinent to this visit.  Plan of Care  Problem-specific:  No problem-specific Assessment & Plan notes found for this encounter.  I have discontinued [name] Whitaker's atorvastatin. I am also having him maintain his loratadine, finasteride, allopurinol, propranolol, hydrochlorothiazide, losartan, fluticasone, gabapentin, glipiZIDE, traMADol, acetaminophen, potassium chloride SA, ketoconazole, rivaroxaban, famotidine, pantoprazole, EPINEPHrine, and methocarbamol.  Pharmacotherapy (Medications Ordered): No orders of the defined types were placed in this encounter.  Orders:  Orders Placed This Encounter  Procedures  . Hip injection (PRN)    For hip pain.    Standing Status:   Standing    Number of Occurrences:   1    Standing Expiration Date:   05/12/2020    Scheduling Instructions:     Side: Right-sided     Sedation: No Sedation.     TIMEFRAME: PRN procedure. ([name] Whitaker will call when needed.)   Follow-up plan:   Return if symptoms worsen or fail to improve, for PRN Procedure(s): (R) IA Hip inj. #2, (Blood-thinner Protocol).      Interventional treatment options: Planned, scheduled, and/or pending:   None at this time   Under consideration NOTE: Xarelto Anticoagulation (Stop: 3 days  Restart: 6 hours) Possible right opturator + femoral NB  Possible right opturator + femoral nerve RFA  Diagnostic right SI joint injection #1  Possible right SI joint RFA  Diagnostic right lumbar facet block  Possible right lumbar facet RFA (the patient indicates having had approximately 5 prior RFA)   Therapeutic/palliative (PRN):   Palliative right IA hip joint injection #2  (last done 04/30/2019)    Recent Visits Date Type Provider Dept  04/30/19 Procedure visit Milinda Pointer,  MD [facility]  04/15/19 Office Visit Milinda Pointer, MD [facility]   Showing recent visits within past 90 days and meeting all other requirements   Today's Visits Date Type Provider Dept  05/13/19 Telemedicine Milinda Pointer, MD [facility]  Showing today's visits and meeting all other requirements   Future Appointments No visits were found meeting these conditions.  Showing future appointments within next 90 days and meeting all other requirements   I discussed the assessment and treatment plan with the patient. The patient was provided an opportunity to ask questions and all were answered. The patient agreed with the plan and demonstrated an understanding of the instructions.  Patient advised to call back or seek an in-person evaluation if the symptoms or condition worsens.  Total duration of non-face-to-face encounter: 15 minutes.  Note by: Gaspar Cola, MD Date: 05/13/2019; Time: 8:56 AM  Note: This dictation was prepared with Dragon dictation. Any transcriptional errors that may result from this process are unintentional.

## 2019-05-13 ENCOUNTER — Ambulatory Visit: Payer: No Typology Code available for payment source | Attending: Pain Medicine | Admitting: Pain Medicine

## 2019-05-13 ENCOUNTER — Other Ambulatory Visit: Payer: Self-pay

## 2019-05-13 DIAGNOSIS — M545 Low back pain, unspecified: Secondary | ICD-10-CM

## 2019-05-13 DIAGNOSIS — G894 Chronic pain syndrome: Secondary | ICD-10-CM | POA: Diagnosis not present

## 2019-05-13 DIAGNOSIS — G8929 Other chronic pain: Secondary | ICD-10-CM

## 2019-05-13 DIAGNOSIS — M25551 Pain in right hip: Secondary | ICD-10-CM

## 2019-05-13 NOTE — Progress Notes (Signed)
-   Normal Creatinine levels are between 0.5 and 0.9 mg/dl for our lab. Any condition that impairs the function of the kidneys is likely to raise the creatinine level in the blood. The most common causes of longstanding (chronic) kidney disease in adults are high blood pressure and diabetes. Other causes of elevated blood creatinine levels include drugs, ingestion of a large amount of dietary meat, kidney infections, rhabdomyolysis (abnormal muscle breakdown), and urinary tract obstruction. ___________________________________________________________________________________   - The normal adult value for sodium is 134-144 mEq/L, for our Lab. Certain conditions may cause an excess of sodium in the blood. Specific causes of hypernatremia include: dehydration or a loss of body fluids from prolonged vomiting, diarrhea, sweating or high fevers; dehydration from not drinking enough water; drugs such as steroids, licorice, and certain blood pressure lowering medicines; certain endocrine diseases such as diabetes (when very frequent urination occurs) or aldosteronism; excess salt intake; and hyperventilation (breathing too fast). ___________________________________________________________________________________

## 2019-05-15 ENCOUNTER — Other Ambulatory Visit: Payer: Self-pay | Admitting: Pain Medicine

## 2019-05-15 NOTE — Telephone Encounter (Signed)
Xarelto must be stopped x at least 3 days prior to procedure. We do have a list of all blood thinners and when they need to be stopped. Anyone not stopping them or stopping them for an insufficient amount of time will need to be rescheduled. Pass this on to everyone. In his case he already came in for the procedure and he had stopped it 5 days, according to him.

## 2019-05-15 NOTE — Progress Notes (Signed)
-   The normal adult value for sodium is 134-144 mEq/L, for our Lab. Certain conditions may cause an excess of sodium in the blood. Specific causes of hypernatremia include: dehydration or a loss of body fluids from prolonged vomiting, diarrhea, sweating or high fevers; dehydration from not drinking enough water; drugs such as steroids, licorice, and certain blood pressure lowering medicines; certain endocrine diseases such as diabetes (when very frequent urination occurs) or aldosteronism; excess salt intake; and hyperventilation (breathing too fast). ___________________________________________________________________________________   - Normal Creatinine levels are between 0.5 and 0.9 mg/dl for our lab. Any condition that impairs the function of the kidneys is likely to raise the creatinine level in the blood. The most common causes of longstanding (chronic) kidney disease in adults are high blood pressure and diabetes. Other causes of elevated blood creatinine levels include drugs, ingestion of a large amount of dietary meat, kidney infections, rhabdomyolysis (abnormal muscle breakdown), and urinary tract obstruction. ___________________________________________________________________________________

## 2019-08-01 ENCOUNTER — Encounter: Payer: Self-pay | Admitting: Pain Medicine

## 2019-08-04 NOTE — Progress Notes (Signed)
Patient: [name] Whitaker  Service Category: E/M  Provider: Gaspar Cola, MD  DOB: [birth year]  DOS: 08/05/2019  Location: Office  MRN: [id number]  Setting: Ambulatory outpatient  Referring Provider: Leighton Ruff, MD  Type: Established Patient  Specialty: Interventional Pain Management  PCP: Leighton Ruff, MD  Location: Remote location  Delivery: TeleHealth     Virtual Encounter - Pain Management PROVIDER NOTE: Information contained herein reflects review and annotations entered in association with encounter. Interpretation of such information and data should be left to medically-trained personnel. Information provided to patient can be located elsewhere in the medical record under "Patient Instructions". Document created using STT-dictation technology, any transcriptional errors that may result from process are unintentional.    Contact & Pharmacy Preferred: [phone] Home: [phone] (home) Mobile: [phone] (mobile) E-mail: [email or web address], [place] - [place] [address] Phone: [phone] Fax: [phone]   Pre-screening  Mr. Wilcock offered "in-person" vs "virtual" encounter. He indicated preferring virtual for this encounter.   Reason COVID-19*  Social distancing based on CDC and AMA recommendations.   I contacted [name] on 08/05/2019 via telephone.      I clearly identified myself as Gaspar Cola, MD. I verified that I was speaking with the correct person using two identifiers (Name: Macsen Nuttall, and date of birth: 1951/04/04).  Consent I sought verbal advanced consent from [name] for virtual visit interactions. I informed Mr. Bodey of possible security and privacy concerns, risks, and limitations associated with providing "not-in-person" medical evaluation and management services. I also informed Mr. Lindon of the availability of "in-person" appointments.  Finally, I informed him that there would be a charge for the virtual visit and that he could be  personally, fully or partially, financially responsible for it. Mr. Ryser expressed understanding and agreed to proceed.   Historic Elements   Mr. Damiel Barthold is a 69 y.o. year old, male patient evaluated today after his last contact with our practice on 05/01/2019. Mr. Treichler  has a past medical history of Gout, Hypercholesteremia, Hypertension, Migraine, OSA (obstructive sleep apnea), and Pulmonary embolism ([place]). He also  has a past surgical history that includes Appendectomy; Nasal septum surgery; Tonsillectomy; Testicle surgery; Cardiac catheterization; and Back surgery. Mr. Tuckey has a current medication list which includes the following prescription(s): allopurinol, chlorthalidone, cyclobenzaprine, epinephrine, famotidine, finasteride, gabapentin, galcanezumab-gnlm, glipizide, ketoconazole, loratadine, losartan, methocarbamol, pantoprazole, potassium chloride sa, propranolol, rivaroxaban, and tramadol. He  reports that he quit smoking about 36 years ago. His smoking use included pipe. He quit after 4.00 years of use. He has never used smokeless tobacco. He reports that he does not drink alcohol or use drugs. Mr. Siebenaler is allergic to penicillins; amlodipine; erenumab-aooe; and lisinopril.   HPI  Today, he is being contacted for worsening of previously known (established) problem.  In the past, he used to be a patient of Dr. Park Breed who treated his lower back and hip problems.  Over a year ago he had his last lumbar facet radiofrequency ablation which did provide him with good relief of the pain, but he believes that by now it is wearing off.  He also indicates that he is having right hip problems and that he can tell that they are to very distinct and different kinds of pain.  Today he has requested that we bring him in to treat his right hip first and to follow that up with a  diagnostic lumbar facet block to determine  if his radiofrequency has worn off and if it did, to have it repeated.  Pharmacotherapy Assessment  Analgesic: None Highest recorded MME/day: 26.67 mg/day MME/day: 0 mg/day   Monitoring: [place] PMP: PDMP reviewed during this encounter.       Pharmacotherapy: No side-effects or adverse reactions reported. Compliance: No problems identified. Effectiveness: Clinically acceptable. Plan: Refer to "POC".  UDS: No results found for: SUMMARY Laboratory Chemistry Profile   Renal Lab Results  Component Value Date   BUN 15 04/15/2019   CREATININE 1.36 (H) 04/15/2019   BCR 11 04/15/2019   GFRAA 62 04/15/2019   GFRNONAA 54 (L) 04/15/2019    Hepatic Lab Results  Component Value Date   AST 20 04/15/2019   ALT 22 09/03/2013   ALBUMIN 4.6 04/15/2019   ALKPHOS 79 04/15/2019   LIPASE 19 03/15/2007    Electrolytes Lab Results  Component Value Date   NA 145 (H) 04/15/2019   K 4.0 04/15/2019   CL 102 04/15/2019   CALCIUM 9.8 04/15/2019   MG 2.2 04/15/2019    Bone Lab Results  Component Value Date   25OHVITD1 43 04/15/2019   25OHVITD2 <1.0 04/15/2019   25OHVITD3 43 04/15/2019    Inflammation (CRP: Acute Phase) (ESR: Chronic Phase) Lab Results  Component Value Date   CRP 2 04/15/2019   ESRSEDRATE 6 04/15/2019      Note: Above Lab results reviewed.  Imaging  Fluoro (C-Arm) (<60 min) (No Report) Fluoro was used, but no Radiologist interpretation will be provided.  Please refer to "NOTES" tab for provider progress note.  Assessment  The primary encounter diagnosis was Chronic hip pain (Secondary area of Pain) (Right). Diagnoses of Osteoarthritis of hip (Right), Chronic low back pain (Primary Area of Pain) (Right) w/o sciatica, Lumbar facet joint syndrome (Bilateral) (R>L), Lumbar facet hypertrophy (Multilevel) , and Chronic anticoagulation (Xarelto) were also pertinent to this visit.  Plan of Care  Problem-specific:  No problem-specific  Assessment & Plan notes found for this encounter.  Mr. Randi College has a current medication list which includes the following long-term medication(s): allopurinol, chlorthalidone, famotidine, glipizide, loratadine, losartan, pantoprazole, potassium chloride sa, propranolol, and rivaroxaban.  Pharmacotherapy (Medications Ordered): No orders of the defined types were placed in this encounter.  Orders:  Orders Placed This Encounter  Procedures  . HIP INJECTION    Standing Status:   Future    Standing Expiration Date:   09/04/2019    Scheduling Instructions:     Side: Right-sided     Sedation: With Sedation.     Timeframe: As soon as schedule allows  . Blood Thinner Instructions to Nursing    If the patient requires a Lovenox-bridge therapy, make sure arrangements are made to institute it with the assistance of the PCP.    Scheduling Instructions:     Always stop the Xarelto (Rivaroxaban) x 3 days prior to procedure or surgery.   Follow-up plan:   Return for Procedure (w/ sedation): (R) IA Hip inj. #2, (Blood-thinner Protocol).      Interventional treatment options: Planned, scheduled, and/or pending:   The plan is to do a right intra-articular hip joint injection first, then followed by a diagnostic bilateral lumbar facet block.  If the second one provide symptom with good relief of the pain, this would signify that the prior radiofrequency ablation has worn off and we would then have it repeated.  The last one was done by Dr. Park Breed before retiring.   Under consideration NOTE: Xarelto Anticoagulation (Stop: 3  days  Restart: 6 hours) Possible right opturator + femoral NB  Possible right opturator + femoral nerve RFA  Diagnostic right SI joint injection #1  Possible right SI joint RFA  Diagnostic right lumbar facet block  Possible right lumbar facet RFA (the patient indicates having had approximately 5 prior RFA)   Therapeutic/palliative (PRN):   Palliative right IA  hip joint injection #2  (last done 04/30/2019)     Recent Visits Date Type Provider Dept  05/13/19 Telemedicine Milinda Pointer, MD [facility]  Showing recent visits within past 90 days and meeting all other requirements   Today's Visits Date Type Provider Dept  08/05/19 Telemedicine Milinda Pointer, MD [facility]  Showing today's visits and meeting all other requirements   Future Appointments No visits were found meeting these conditions.  Showing future appointments within next 90 days and meeting all other requirements   I discussed the assessment and treatment plan with the patient. The patient was provided an opportunity to ask questions and all were answered. The patient agreed with the plan and demonstrated an understanding of the instructions.  Patient advised to call back or seek an in-person evaluation if the symptoms or condition worsens.  Duration of encounter: 14 minutes.  Note by: Gaspar Cola, MD Date: 08/05/2019; Time: 3:13 PM

## 2019-08-05 ENCOUNTER — Other Ambulatory Visit: Payer: Self-pay

## 2019-08-05 ENCOUNTER — Ambulatory Visit: Payer: No Typology Code available for payment source | Attending: Pain Medicine | Admitting: Pain Medicine

## 2019-08-05 DIAGNOSIS — M545 Low back pain, unspecified: Secondary | ICD-10-CM

## 2019-08-05 DIAGNOSIS — M25551 Pain in right hip: Secondary | ICD-10-CM | POA: Diagnosis not present

## 2019-08-05 DIAGNOSIS — M1611 Unilateral primary osteoarthritis, right hip: Secondary | ICD-10-CM | POA: Diagnosis not present

## 2019-08-05 DIAGNOSIS — Z7901 Long term (current) use of anticoagulants: Secondary | ICD-10-CM

## 2019-08-05 DIAGNOSIS — G8929 Other chronic pain: Secondary | ICD-10-CM

## 2019-08-05 DIAGNOSIS — M47816 Spondylosis without myelopathy or radiculopathy, lumbar region: Secondary | ICD-10-CM | POA: Diagnosis not present

## 2019-08-05 NOTE — Patient Instructions (Signed)

## 2019-08-13 ENCOUNTER — Ambulatory Visit (HOSPITAL_BASED_OUTPATIENT_CLINIC_OR_DEPARTMENT_OTHER): Payer: No Typology Code available for payment source | Admitting: Pain Medicine

## 2019-08-13 ENCOUNTER — Encounter: Payer: Self-pay | Admitting: Pain Medicine

## 2019-08-13 ENCOUNTER — Ambulatory Visit
Admission: RE | Admit: 2019-08-13 | Discharge: 2019-08-13 | Disposition: A | Discharge status: Home / Self Care | Payer: No Typology Code available for payment source | Source: Ambulatory Visit | Attending: Pain Medicine | Admitting: Pain Medicine

## 2019-08-13 ENCOUNTER — Other Ambulatory Visit: Payer: Self-pay

## 2019-08-13 VITALS — BP 125/72 | HR 65 | Temp 97.1°F | Resp 20 | Ht 70.0 in | Wt 230.0 lb

## 2019-08-13 DIAGNOSIS — G8929 Other chronic pain: Secondary | ICD-10-CM

## 2019-08-13 DIAGNOSIS — M1611 Unilateral primary osteoarthritis, right hip: Secondary | ICD-10-CM | POA: Diagnosis present

## 2019-08-13 DIAGNOSIS — M25551 Pain in right hip: Secondary | ICD-10-CM | POA: Diagnosis present

## 2019-08-13 DIAGNOSIS — Z7901 Long term (current) use of anticoagulants: Secondary | ICD-10-CM | POA: Insufficient documentation

## 2019-08-13 MED ORDER — MIDAZOLAM HCL 5 MG/5ML IJ SOLN: 1.0000 mg | INTRAMUSCULAR | Status: DC | PRN

## 2019-08-13 MED ORDER — METHYLPREDNISOLONE ACETATE 80 MG/ML IJ SUSP
80.0000 mg | Freq: Once | INTRAMUSCULAR | Status: DC
  Filled 2019-08-13: qty 1

## 2019-08-13 MED ORDER — ROPIVACAINE HCL 2 MG/ML IJ SOLN
9.0000 mL | Freq: Once | INTRAMUSCULAR | Status: AC
  Administered 2019-08-13: 9 mL via INTRA_ARTICULAR
  Filled 2019-08-13: qty 10

## 2019-08-13 MED ORDER — FENTANYL CITRATE (PF) 100 MCG/2ML IJ SOLN: 25.0000 ug | INTRAMUSCULAR | Status: DC | PRN

## 2019-08-13 MED ORDER — LIDOCAINE HCL 2 % IJ SOLN
20.0000 mL | Freq: Once | INTRAMUSCULAR | Status: AC
  Administered 2019-08-13: 400 mg
  Filled 2019-08-13: qty 40

## 2019-08-13 MED ORDER — LACTATED RINGERS IV SOLN: 1000.0000 mL | Freq: Once | INTRAVENOUS | Status: DC

## 2019-08-13 MED ORDER — IOHEXOL 180 MG/ML  SOLN
10.0000 mL | Freq: Once | INTRAMUSCULAR | Status: AC
  Administered 2019-08-13: 10 mL via EPIDURAL
  Filled 2019-08-13: qty 20

## 2019-08-13 NOTE — Patient Instructions (Addendum)
____________________________________________________________________________________________  Post-Procedure Discharge Instructions  Instructions:  Apply ice:   Purpose: This will minimize any swelling and discomfort after procedure.   When: Day of procedure, as soon as you get home.  How: Fill a plastic sandwich bag with crushed ice. Cover it with a small towel and apply to injection site.  How long: (15 min on, 15 min off) Apply for 15 minutes then remove x 15 minutes.  Repeat sequence on day of procedure, until you go to bed.  Apply heat:   Purpose: To treat any soreness and discomfort from the procedure.  When: Starting the next day after the procedure.  How: Apply heat to procedure site starting the day following the procedure.  How long: May continue to repeat daily, until discomfort goes away.  Food intake: Start with clear liquids (like water) and advance to regular food, as tolerated.   Physical activities: Keep activities to a minimum for the first 8 hours after the procedure. After that, then as tolerated.  Driving: If you have received any sedation, be responsible and do not drive. You are not allowed to drive for 24 hours after having sedation.  Blood thinner: (Applies only to those taking blood thinners) You may restart your blood thinner 6 hours after your procedure.  Insulin: (Applies only to Diabetic patients taking insulin) As soon as you can eat, you may resume your normal dosing schedule.  Infection prevention: Keep procedure site clean and dry. Shower daily and clean area with soap and water.  Post-procedure Pain Diary: Extremely important that this be done correctly and accurately. Recorded information will be used to determine the next step in treatment. For the purpose of accuracy, follow these rules:  Evaluate only the area treated. Do not report or include pain from an untreated area. For the purpose of this evaluation, ignore all other areas of pain,  except for the treated area.  After your procedure, avoid taking a long nap and attempting to complete the pain diary after you wake up. Instead, set your alarm clock to go off every hour, on the hour, for the initial 8 hours after the procedure. Document the duration of the numbing medicine, and the relief you are getting from it.  Do not go to sleep and attempt to complete it later. It will not be accurate. If you received sedation, it is likely that you were given a medication that may cause amnesia. Because of this, completing the diary at a later time may cause the information to be inaccurate. This information is needed to plan your care.  Follow-up appointment: Keep your post-procedure follow-up evaluation appointment after the procedure (usually 2 weeks for most procedures, 6 weeks for radiofrequencies). DO NOT FORGET to bring you pain diary with you.   Expect: (What should I expect to see with my procedure?)  From numbing medicine (AKA: Local Anesthetics): Numbness or decrease in pain. You may also experience some weakness, which if present, could last for the duration of the local anesthetic.  Onset: Full effect within 15 minutes of injected.  Duration: It will depend on the type of local anesthetic used. On the average, 1 to 8 hours.   From steroids (Applies only if steroids were used): Decrease in swelling or inflammation. Once inflammation is improved, relief of the pain will follow.  Onset of benefits: Depends on the amount of swelling present. The more swelling, the longer it will take for the benefits to be seen. In some cases, up to 10 days.    Duration: Steroids will stay in the system x 2 weeks. Duration of benefits will depend on multiple posibilities including persistent irritating factors.  Side-effects: If present, they may typically last 2 weeks (the duration of the steroids).  Frequent: Cramps (if they occur, drink Gatorade and take over-the-counter Magnesium 450-500 mg  once to twice a day); water retention with temporary weight gain; increases in blood sugar; decreased immune system response; increased appetite.  Occasional: Facial flushing (red, warm cheeks); mood swings; menstrual changes.  Uncommon: Long-term decrease or suppression of natural hormones; bone thinning. (These are more common with higher doses or more frequent use. This is why we prefer that our patients avoid having any injection therapies in other practices.)   Very Rare: Severe mood changes; psychosis; aseptic necrosis.  From procedure: Some discomfort is to be expected once the numbing medicine wears off. This should be minimal if ice and heat are applied as instructed.  Call if: (When should I call?)  You experience numbness and weakness that gets worse with time, as opposed to wearing off.  New onset bowel or bladder incontinence. (Applies only to procedures done in the spine)  Emergency Numbers:  Durning business hours (Monday - Thursday, 8:00 AM - 4:00 PM) (Friday, 9:00 AM - 12:00 Noon): [phone]  After hours: [phone]  NOTE: If you are having a problem and are unable connect with, or to talk to a provider, then go to your nearest urgent care or emergency department. If the problem is serious and urgent, please call 911. ____________________________________________________________________________________________   ____________________________________________________________________________________________  Blood Thinners  IMPORTANT NOTICE:  If you take any of these, make sure to notify the nursing staff.  Failure to do so may result in injury.  Recommended time intervals to stop and restart blood-thinners, before & after invasive procedures  Generic Name Brand Name Stop Time. Must be stopped at least this long before procedures. After procedures, wait at least this long before re-starting.  Abciximab Reopro 15 days 2 hrs  Alteplase Activase 10 days 10 days   Anagrelide Agrylin    Apixaban Eliquis 3 days 6 hrs  Cilostazol Pletal 3 days 5 hrs  Clopidogrel Plavix 7-10 days 2 hrs  Dabigatran Pradaxa 5 days 6 hrs  Dalteparin Fragmin 24 hours 4 hrs  Dipyridamole Aggrenox 11days 2 hrs  Edoxaban Lixiana; Savaysa 3 days 2 hrs  Enoxaparin  Lovenox 24 hours 4 hrs  Eptifibatide Integrillin 8 hours 2 hrs  Fondaparinux  Arixtra 72 hours 12 hrs  Prasugrel Effient 7-10 days 6 hrs  Reteplase Retavase 10 days 10 days  Rivaroxaban Xarelto 3 days 6 hrs  Ticagrelor Brilinta 5-7 days 6 hrs  Ticlopidine Ticlid 10-14 days 2 hrs  Tinzaparin Innohep 24 hours 4 hrs  Tirofiban Aggrastat 8 hours 2 hrs  Warfarin Coumadin 5 days 2 hrs   Other medications with blood-thinning effects  Product indications Generic (Brand) names Note  Cholesterol Lipitor Stop 4 days before procedure  Blood thinner (injectable) Heparin (LMW or LMWH Heparin) Stop 24 hours before procedure  Cancer Ibrutinib (Imbruvica) Stop 7 days before procedure  Malaria/Rheumatoid Hydroxychloroquine (Plaquenil) Stop 11 days before procedure  Thrombolytics  10 days before or after procedures   Over-the-counter (OTC) Products with blood-thinning effects  Product Common names Stop Time  Aspirin > 325 mg Goody Powders, Excedrin, etc. 11 days  Aspirin ? 81 mg  7 days  Fish oil  4 days  Garlic supplements  7 days  Ginkgo biloba  36 hours  Ginseng  24 hours  NSAIDs Ibuprofen, Naprosyn, etc. 3 days  Vitamin E  4 days   ____________________________________________________________________________________________  Pain Management Discharge Instructions  General Discharge Instructions :  If you need to reach your doctor call: Monday-Friday 8:00 am - 4:00 pm at [phone] or toll free [phone].  After clinic hours [phone] to have operator reach doctor.  Bring all of your medication bottles to all your appointments in the pain clinic.  To cancel or reschedule your appointment with Pain  Management please remember to call 24 hours in advance to avoid a fee.  Refer to the educational materials which you have been given on: General Risks, I had my Procedure. Discharge Instructions, Post Sedation.  Post Procedure Instructions:  The drugs you were given will stay in your system until tomorrow, so for the next 24 hours you should not drive, make any legal decisions or drink any alcoholic beverages.  You may eat anything you prefer, but it is better to start with liquids then soups and crackers, and gradually work up to solid foods.  Please notify your doctor immediately if you have any unusual bleeding, trouble breathing or pain that is not related to your normal pain.  Depending on the type of procedure that was done, some parts of your body may feel week and/or numb.  This usually clears up by tonight or the next day.  Walk with the use of an assistive device or accompanied by an adult for the 24 hours.  You may use ice on the affected area for the first 24 hours.  Put ice in a Ziploc bag and cover with a towel and place against area 15 minutes on 15 minutes off.  You may switch to heat after 24 hours.

## 2019-08-13 NOTE — Progress Notes (Signed)
PROVIDER NOTE: Information contained herein reflects review and annotations entered in association with encounter. Interpretation of such information and data should be left to medically-trained personnel. Information provided to patient can be located elsewhere in the medical record under "Patient Instructions". Document created using STT-dictation technology, any transcriptional errors that may result from process are unintentional.    Patient: [name] Whitaker  Service Category: Procedure  Provider: Gaspar Cola, MD  DOB: Jun 14, 1950  DOS: 08/13/2019  Location: [facility]  MRN: [id number]  Setting: Ambulatory - outpatient  Referring Provider: Leighton Ruff, MD  Type: Established Patient  Specialty: Interventional Pain Management  PCP: Leighton Ruff, MD   Primary Reason for Visit: Interventional Pain Management Treatment. CC: Hip Pain (right)  Procedure:          Anesthesia, Analgesia, Anxiolysis:  Type: Intra-Articular Hip Injection #2  Primary Purpose: Diagnostic/Therapeutric Region: Posterolateral hip joint area. Level: Lower pelvic and hip joint level. Target Area: Superior aspect of the hip joint cavity, going thru the superior portion of the capsular ligament. Approach: Posterolateral approach. Laterality: Right  Type: Local Anesthesia Indication(s): Analgesia         Route: Infiltration ([place]/IM) IV Access: Declined Sedation: Declined  Local Anesthetic: Lidocaine 1-2%  Position: Lateral Decubitus with bad side up Prepped Area: Entire Posterolateral hip area. Prepping solution: DuraPrep (Iodine Povacrylex [0.7% available iodine] and Isopropyl Alcohol, 74% w/w)   Indications: 1. Chronic hip pain (Secondary area of Pain) (Right)   2. Osteoarthritis of hip (Right)   3. Chronic anticoagulation (Xarelto)    Pain Score: Pre-procedure: 5 /10 Post-procedure: 5 /10   Pre-op Assessment:  Mr. Wilkins is a 69 y.o. (year old), male patient, seen today for  interventional treatment. He  has a past surgical history that includes Appendectomy; Nasal septum surgery; Tonsillectomy; Testicle surgery; Cardiac catheterization; and Back surgery. Mr. Tuazon has a current medication list which includes the following prescription(s): allopurinol, chlorthalidone, cyclobenzaprine, epinephrine, famotidine, finasteride, gabapentin, galcanezumab-gnlm, glipizide, ketoconazole, loratadine, losartan, methocarbamol, pantoprazole, potassium chloride sa, propranolol, rivaroxaban, and tramadol, and the following Facility-Administered Medications: methylprednisolone acetate. His primarily concern today is the Hip Pain (right)  Initial Vital Signs:  Pulse/HCG Rate: 65ECG Heart Rate: 63 Temp: (!) 97.1 F (36.2 C) Resp: 16 BP: (!) 137/100 SpO2: 99 %  BMI: Estimated body mass index is 33 kg/m as calculated from the following:   Height as of this encounter: 5\' 10"  (1.778 m).   Weight as of this encounter: 230 lb (104.3 kg).  Risk Assessment: Allergies: Reviewed. He is allergic to penicillins; amlodipine; erenumab-aooe; and lisinopril.  Allergy Precautions: None required Coagulopathies: Reviewed. None identified.  Blood-thinner therapy: None at this time Active Infection(s): Reviewed. None identified. Mr. Pana is afebrile  Site Confirmation: Mr. Micheau was asked to confirm the procedure and laterality before marking the site Procedure checklist: Completed Consent: Before the procedure and under the influence of no sedative(s), amnesic(s), or anxiolytics, the patient was informed of the treatment options, risks and possible complications. To fulfill our ethical and legal obligations, as recommended by the American Medical Association's Code of Ethics, I have informed the patient of my clinical impression; the nature and purpose of the treatment or procedure; the risks, benefits, and possible complications of the intervention; the alternatives, including doing nothing;  the risk(s) and benefit(s) of the alternative treatment(s) or procedure(s); and the risk(s) and benefit(s) of doing nothing. The patient was provided information about the general risks and possible complications associated with the procedure. These may include, but  are not limited to: failure to achieve desired goals, infection, bleeding, organ or nerve damage, allergic reactions, paralysis, and death. In addition, the patient was informed of those risks and complications associated to the procedure, such as failure to decrease pain; infection; bleeding; organ or nerve damage with subsequent damage to sensory, motor, and/or autonomic systems, resulting in permanent pain, numbness, and/or weakness of one or several areas of the body; allergic reactions; (i.e.: anaphylactic reaction); and/or death. Furthermore, the patient was informed of those risks and complications associated with the medications. These include, but are not limited to: allergic reactions (i.e.: anaphylactic or anaphylactoid reaction(s)); adrenal axis suppression; blood sugar elevation that in diabetics may result in ketoacidosis or comma; water retention that in patients with history of congestive heart failure may result in shortness of breath, pulmonary edema, and decompensation with resultant heart failure; weight gain; swelling or edema; medication-induced neural toxicity; particulate matter embolism and blood vessel occlusion with resultant organ, and/or nervous system infarction; and/or aseptic necrosis of one or more joints. Finally, the patient was informed that Medicine is not an exact science; therefore, there is also the possibility of unforeseen or unpredictable risks and/or possible complications that may result in a catastrophic outcome. The patient indicated having understood very clearly. We have given the patient no guarantees and we have made no promises. Enough time was given to the patient to ask questions, all of which  were answered to the patient's satisfaction. Mr. Dargin has indicated that he wanted to continue with the procedure. Attestation: I, the ordering provider, attest that I have discussed with the patient the benefits, risks, side-effects, alternatives, likelihood of achieving goals, and potential problems during recovery for the procedure that I have provided informed consent. Date  Time: 08/13/2019  8:12 AM  Pre-Procedure Preparation:  Monitoring: As per clinic protocol. Respiration, ETCO2, SpO2, BP, heart rate and rhythm monitor placed and checked for adequate function Safety Precautions: Patient was assessed for positional comfort and pressure points before starting the procedure. Time-out: I initiated and conducted the "Time-out" before starting the procedure, as per protocol. The patient was asked to participate by confirming the accuracy of the "Time Out" information. Verification of the correct person, site, and procedure were performed and confirmed by me, the nursing staff, and the patient. "Time-out" conducted as per Joint Commission's Universal Protocol (UP.01.01.01). Time: 0912  Description of Procedure:          Safety Precautions: Aspiration looking for blood return was conducted prior to all injections. At no point did we inject any substances, as a needle was being advanced. No attempts were made at seeking any paresthesias. Safe injection practices and needle disposal techniques used. Medications properly checked for expiration dates. SDV (single dose vial) medications used. Description of the Procedure: Protocol guidelines were followed. The patient was placed in position over the fluoroscopy table. The target area was identified and the area prepped in the usual manner. Skin & deeper tissues infiltrated with local anesthetic. Appropriate amount of time allowed to pass for local anesthetics to take effect. The procedure needles were then advanced to the target area. Proper needle  placement secured. Negative aspiration confirmed. Solution injected in intermittent fashion, asking for systemic symptoms every 0.5cc of injectate. The needles were then removed and the area cleansed, making sure to leave some of the prepping solution back to take advantage of its long term bactericidal properties. Vitals:   08/13/19 0813 08/13/19 0905 08/13/19 0912 08/13/19 0916  BP: (!) 137/93 117/73 119/72 125/72  Pulse:      Resp:  16 (!) 21 20  Temp:      TempSrc:      SpO2:  98% 100% 100%  Weight:      Height:        Start Time: 0912 hrs. End Time: 0915 hrs. Materials:  Needle(s) Type: Spinal Needle Gauge: 22G Length: 5.0-in Medication(s): Please see orders for medications and dosing details.  Imaging Guidance (Non-Spinal):          Type of Imaging Technique: Fluoroscopy Guidance (Non-Spinal) Indication(s): Assistance in needle guidance and placement for procedures requiring needle placement in or near specific anatomical locations not easily accessible without such assistance. Exposure Time: Please see nurses notes. Contrast: Before injecting any contrast, we confirmed that the patient did not have an allergy to iodine, shellfish, or radiological contrast. Once satisfactory needle placement was completed at the desired level, radiological contrast was injected. Contrast injected under live fluoroscopy. No contrast complications. See chart for type and volume of contrast used. Fluoroscopic Guidance: I was personally present during the use of fluoroscopy. "Tunnel Vision Technique" used to obtain the best possible view of the target area. Parallax error corrected before commencing the procedure. "Direction-depth-direction" technique used to introduce the needle under continuous pulsed fluoroscopy. Once target was reached, antero-posterior, oblique, and lateral fluoroscopic projection used confirm needle placement in all planes. Images permanently stored in EMR. Interpretation: I  personally interpreted the imaging intraoperatively. Adequate needle placement confirmed in multiple planes. Appropriate spread of contrast into desired area was observed. No evidence of afferent or efferent intravascular uptake. Permanent images saved into the patient's record.  Antibiotic Prophylaxis:   Anti-infectives (From admission, onward)   None     Indication(s): None identified  Post-operative Assessment:  Post-procedure Vital Signs:  Pulse/HCG Rate: 6561 Temp: (!) 97.1 F (36.2 C) Resp: 20 BP: 125/72 SpO2: 100 %  EBL: None  Complications: No immediate post-treatment complications observed by team, or reported by patient.  Note: The patient tolerated the entire procedure well. A repeat set of vitals were taken after the procedure and the patient was kept under observation following institutional policy, for this type of procedure. Post-procedural neurological assessment was performed, showing return to baseline, prior to discharge. The patient was provided with post-procedure discharge instructions, including a section on how to identify potential problems. Should any problems arise concerning this procedure, the patient was given instructions to immediately contact us, at any time, without hesitation. In any case, we plan to contact the patient by telephone for a follow-up status report regarding this interventional procedure.  Comments:  No additional relevant information.  Plan of Care  Orders:  Orders Placed This Encounter  Procedures  . HIP INJECTION    Scheduling Instructions:     Side: Right-sided     Sedation: With Sedation.     Timeframe: Today  . [facility] C-ARM 1-60 MIN NO REPORT    Intraoperative interpretation by procedural physician at [facility].    Standing Status:   Standing    Number of Occurrences:   1    Order Specific Question:   Reason for exam:    Answer:   Assistance in needle guidance and placement for procedures requiring  needle placement in or near specific anatomical locations not easily accessible without such assistance.  . Informed Consent Details: Physician/Practitioner Attestation; Transcribe to consent form and obtain patient signature    Nursing Order: Transcribe to consent form and obtain patient signature. Note: Always confirm  laterality of pain with Mr. Gentis, before procedure. Procedure: Hip injection Indication/Reason: Hip Joint Pain (Arthralgia) Provider Attestation: I, Hutton Dossie Arbour, MD, (Pain Management Specialist), the physician/practitioner, attest that I have discussed with the patient the benefits, risks, side effects, alternatives, likelihood of achieving goals and potential problems during recovery for the procedure that I have provided informed consent.  . Care order/instruction: Please confirm that the patient has stopped the Xarelto (Rivaroxaban) x 3 days prior to procedure or surgery.    Please confirm that the patient has stopped the Xarelto (Rivaroxaban) x 3 days prior to procedure or surgery.    Standing Status:   Standing    Number of Occurrences:   1  . Provide equipment / supplies at bedside    Equipment required: Single use, disposable, "Block Tray"    Standing Status:   Standing    Number of Occurrences:   1    Order Specific Question:   Specify    Answer:   Block Tray  . Bleeding precautions    Standing Status:   Standing    Number of Occurrences:   1   Chronic Opioid Analgesic:  None Highest recorded MME/day: 26.67 mg/day MME/day: 0 mg/day   Medications ordered for procedure: Meds ordered this encounter  Medications  . iohexol (OMNIPAQUE) 180 MG/ML injection 10 mL    Must be Myelogram-compatible. If not available, you may substitute with a water-soluble, non-ionic, hypoallergenic, myelogram-compatible radiological contrast medium.  Marland Kitchen lidocaine (XYLOCAINE) 2 % (with pres) injection 400 mg  . DISCONTD: lactated ringers infusion 1,000 mL  . DISCONTD:  midazolam (VERSED) 5 MG/5ML injection 1-2 mg    Make sure Flumazenil is available in the pyxis when using this medication. If oversedation occurs, administer 0.2 mg IV over 15 sec. If after 45 sec no response, administer 0.2 mg again over 1 min; may repeat at 1 min intervals; not to exceed 4 doses (1 mg)  . DISCONTD: fentaNYL (SUBLIMAZE) injection 25-50 mcg    Make sure Narcan is available in the pyxis when using this medication. In the event of respiratory depression (RR< 8/min): Titrate NARCAN (naloxone) in increments of 0.1 to 0.2 mg IV at 2-3 minute intervals, until desired degree of reversal.  . ropivacaine (PF) 2 mg/mL (0.2%) (NAROPIN) injection 9 mL  . methylPREDNISolone acetate (DEPO-MEDROL) injection 80 mg   Medications administered: We administered iohexol, lidocaine, and ropivacaine (PF) 2 mg/mL (0.2%).  See the medical record for exact dosing, route, and time of administration.  Follow-up plan:   Return in about 2 weeks (around 08/27/2019) for (VV), (PP).       Interventional treatment options: Planned, scheduled, and/or pending:   The plan is to do a right intra-articular hip joint injection first, then followed by a diagnostic bilateral lumbar facet block.  If the second one provide symptom with good relief of the pain, this would signify that the prior radiofrequency ablation has worn off and we would then have it repeated.  The last one was done by Dr. Park Breed before retiring.   Under consideration NOTE: Xarelto Anticoagulation (Stop: 3 days  Restart: 6 hours) Possible right opturator + femoral NB  Possible right opturator + femoral nerve RFA  Diagnostic right SI joint injection #1  Possible right SI joint RFA  Diagnostic right lumbar facet block  Possible right lumbar facet RFA (the patient indicates having had approximately 5 prior RFA)   Therapeutic/palliative (PRN):   Palliative right IA hip joint injection #2  (last done  04/30/2019)      Recent Visits Date  Type Provider Dept  08/05/19 Telemedicine Milinda Pointer, MD [facility]  Showing recent visits within past 90 days and meeting all other requirements   Today's Visits Date Type Provider Dept  08/13/19 Procedure visit Milinda Pointer, MD [facility]  Showing today's visits and meeting all other requirements   Future Appointments Date Type Provider Dept  08/26/19 Appointment Milinda Pointer, MD [facility]  Showing future appointments within next 90 days and meeting all other requirements   Disposition: Discharge home  Discharge (Date  Time): 08/13/2019; 0930 hrs.   Primary Care Physician: Leighton Ruff, MD Location: [facility] Note by: Gaspar Cola, MD Date: 08/13/2019; Time: 9:32 AM  Disclaimer:  Medicine is not an Chief Strategy Officer. The only guarantee in medicine is that nothing is guaranteed. It is important to note that the decision to proceed with this intervention was based on the information collected from the patient. The Data and conclusions were drawn from the patient's questionnaire, the interview, and the physical examination. Because the information was provided in large part by the patient, it cannot be guaranteed that it has not been purposely or unconsciously manipulated. Every effort has been made to obtain as much relevant data as possible for this evaluation. It is important to note that the conclusions that lead to this procedure are derived in large part from the available data. Always take into account that the treatment will also be dependent on availability of resources and existing treatment guidelines, considered by other Pain Management Practitioners as being common knowledge and practice, at the time of the intervention. For Medico-Legal purposes, it is also important to point out that variation in procedural techniques and pharmacological choices are the acceptable norm. The indications,  contraindications, technique, and results of the above procedure should only be interpreted and judged by a Board-Certified Interventional Pain Specialist with extensive familiarity and expertise in the same exact procedure and technique.

## 2019-08-14 ENCOUNTER — Telehealth: Payer: Self-pay | Admitting: *Deleted

## 2019-08-14 NOTE — Telephone Encounter (Signed)
No problems post procedure. 

## 2019-08-22 ENCOUNTER — Encounter: Payer: Self-pay | Admitting: Pain Medicine

## 2019-08-22 NOTE — Progress Notes (Signed)
Pain relief after procedure (treated area only): (Questions asked to patient) 1. Starting about 15 minutes after the procedure, and "while the area was still numb" (from the local anesthetics), were you having any of your usual pain "in that area" (the treated area)?  (NOTE: NOT including the discomfort from the needle sticks.) First 1 hour: 100 % better. First 4-6 hours:100 % better.   3. How much better is your pain now, when compared to before the procedure? Current benefit: 50 % better. 4. Can you move better now? Improvement in ROM (Range of Motion): Yes. 5. Can you do more now? Improvement in function: Yes. 4. Did you have any problems with the procedure? Side-effects/Complications: No. 

## 2019-08-25 NOTE — Progress Notes (Signed)
Patient: [name] Whitaker  Service Category: E/M  Provider: Gaspar Cola, MD  DOB: Sep 25, 1950  DOS: 08/26/2019  Location: Office  MRN: [id number]  Setting: Ambulatory outpatient  Referring Provider: Leighton Ruff, MD  Type: Established Patient  Specialty: Interventional Pain Management  PCP: Leighton Ruff, MD  Location: Remote location  Delivery: TeleHealth     Virtual Encounter - Pain Management PROVIDER NOTE: Information contained herein reflects review and annotations entered in association with encounter. Interpretation of such information and data should be left to medically-trained personnel. Information provided to patient can be located elsewhere in the medical record under "Patient Instructions". Document created using STT-dictation technology, any transcriptional errors that may result from process are unintentional.    Contact & Pharmacy Preferred: [phone] Home: [phone] (home) Mobile: [phone] (mobile) E-mail: [name], [place] [address] Phone: [phone] Fax: [phone]   Pre-screening  [name] Whitaker offered "in-person" vs "virtual" encounter. He indicated preferring virtual for this encounter.   Reason COVID-19*  Social distancing based on CDC and AMA recommendations.   I contacted [name] Whitaker on 08/26/2019 via telephone.      I clearly identified myself as Gaspar Cola, MD. I verified that I was speaking with the correct person using two identifiers (Name: [name] Whitaker, and date of birth: [birth year]).  Consent I sought verbal advanced consent from [name] Whitaker for virtual visit interactions. I informed [name] Whitaker of possible security and privacy concerns, risks, and limitations associated with providing "not-in-person" medical evaluation and management services. I also informed Mr. Monnig of the availability of "in-person" appointments.  Finally, I informed him that there would be a charge for the virtual visit and that he could be  personally, fully or partially, financially responsible for it. Mr. Andel expressed understanding and agreed to proceed.   Historic Elements   [name] Whitaker is a 69 y.o. year old, male patient evaluated today after his last contact with our practice on 08/14/2019. [name] Whitaker  has a past medical history of Gout, Hypercholesteremia, Hypertension, Migraine, OSA (obstructive sleep apnea), and Pulmonary embolism ([place]). He also  has a past surgical history that includes Appendectomy; Nasal septum surgery; Tonsillectomy; Testicle surgery; Cardiac catheterization; and Back surgery. [name] Whitaker has a current medication list which includes the following prescription(s): allopurinol, chlorthalidone, cholecalciferol, cyclobenzaprine, epinephrine, famotidine, finasteride, gabapentin, galcanezumab-gnlm, glipizide, ketoconazole, loratadine, losartan, methocarbamol, potassium chloride sa, propranolol, rivaroxaban, tramadol, and turmeric. He  reports that he quit smoking about 36 years ago. His smoking use included pipe. He quit after 4.00 years of use. He has never used smokeless tobacco. He reports that he does not drink alcohol or use drugs. [name] Whitaker is allergic to penicillins; amlodipine; erenumab-aooe; and lisinopril.   HPI  Today, he is being contacted for a post-procedure assessment.  Today I took the time to go over the results of the diagnostic right-sided intra-articular hip joint injection with the patient.  He describes 100% relief of the pain for the duration of the local anesthetic (4 to 8 hours), after which it began to wear off and he had some discomfort that still was about 50% better but lasted a couple days.  Today he indicates that it has improved to a point where he is completely 100% pain-free at this instant.  Today I took the time to explain to the patient how the diagnostic injections work  and how they know the medicine provided [place] with information regarding to the  origin of this pain and how his second wave of relief confirms that we are dealing with an inflammatory process.  Because of this, he would be a good candidate for the use of NSAIDs, however, he is on a blood thinner and therefore it is contraindicated.  As an alternative to this, I have suggested that he try that he try using some turmeric as well as an anti-inflammatory diet.  I have provided the patient with written information about this to through the patient information section on the chart.  He indicates having access to "MY CHART" system and therefore he should be able to get that information.  At this point he refers doing well and he has indicated that he rather wait in terms of the treatment of his lower back.  This is perfectly okay with me and I have instructed the patient to simply give [place] a call whenever he is ready.  Post-Procedure Evaluation  Procedure (08/13/2019): Diagnostic/therapeutic right-sided IA hip joint injection #2 under fluoroscopic guidance, no sedation. Pre-procedure pain level:  5/10 Post-procedure: 5/10 No initial benefit, possibly due to rapid discharge after no sedation procedure, without enough time to allow full onset of block.  Sedation: None.  [name] Shorter, RN  08/22/2019 10:56 AM  Signed Pain relief after procedure (treated area only): (Questions asked to patient) 1. Starting about 15 minutes after the procedure, and "while the area was still numb" (from the local anesthetics), were you having any of your usual pain "in that area" (the treated area)?  (NOTE: NOT including the discomfort from the needle sticks.) First 1 hour: 100 % better. First 4-6 hours: 100 % better.   3. How much better is your pain now, when compared to before the procedure? Current benefit: 50 % better. 4. Can you move better now? Improvement in ROM (Range of Motion): Yes. 5. Can you do more now? Improvement in  function: Yes. 4. Did you have any problems with the procedure? Side-effects/Complications: No.  Current benefits: Defined as benefit that persist at this time.   Analgesia:  50% improved Function: [name] Whitaker reports improvement in function ROM: Mr. Smolinsky reports improvement in ROM  Pharmacotherapy Assessment  Analgesic: None Highest recorded MME/day: 26.67 mg/day MME/day: 0 mg/day   Monitoring: [place] PMP: PDMP reviewed during this encounter.       Pharmacotherapy: No side-effects or adverse reactions reported. Compliance: No problems identified. Effectiveness: Clinically acceptable. Plan: Refer to "POC".  UDS: No results found for: SUMMARY Laboratory Chemistry Profile   Renal Lab Results  Component Value Date   BUN 15 04/15/2019   CREATININE 1.36 (H) 04/15/2019   BCR 11 04/15/2019   GFRAA 62 04/15/2019   GFRNONAA 54 (L) 04/15/2019     Hepatic Lab Results  Component Value Date   AST 20 04/15/2019   ALT 22 09/03/2013   ALBUMIN 4.6 04/15/2019   ALKPHOS 79 04/15/2019   LIPASE 19 03/15/2007     Electrolytes Lab Results  Component Value Date   NA 145 (H) 04/15/2019   K 4.0 04/15/2019   CL 102 04/15/2019   CALCIUM 9.8 04/15/2019   MG 2.2 04/15/2019     Bone Lab Results  Component Value Date   25OHVITD1 43 04/15/2019   25OHVITD2 <1.0 04/15/2019   25OHVITD3 43 04/15/2019     Inflammation (CRP: Acute Phase) (ESR: Chronic Phase) Lab Results  Component Value Date   CRP 2 04/15/2019   ESRSEDRATE 6 04/15/2019       Note: Above Lab  results reviewed.  Imaging  DG PAIN CLINIC C-ARM 1-60 MIN NO REPORT Fluoro was used, but no Radiologist interpretation will be provided.  Please refer to "NOTES" tab for provider progress note.  Assessment  The primary encounter diagnosis was Chronic low back pain (Primary Area of Pain) (Right) w/o sciatica. Diagnoses of Chronic hip pain (Secondary area of Pain) (Right), Arthritis of hip (Right), and Chronic anticoagulation  (Xarelto) were also pertinent to this visit.  Plan of Care  Problem-specific:  No problem-specific Assessment & Plan notes found for this encounter.  Mr. Rakesh Dutko has a current medication list which includes the following long-term medication(s): allopurinol, chlorthalidone, famotidine, glipizide, loratadine, losartan, potassium chloride sa, propranolol, and rivaroxaban.  Pharmacotherapy (Medications Ordered): Meds ordered this encounter  Medications  . Turmeric 500 MG CAPS    Sig: Take 500 mg by mouth daily.    Dispense:  90 capsule    Refill:  0    OTC Recommendation. Please provide information on: where to find; how to take; side-effects; adverse reactions; drug-to-drug interactions; and contraindications. If unavailable, recommend similar substitute.   Orders:  No orders of the defined types were placed in this encounter.  Follow-up plan:   Return if symptoms worsen or fail to improve.      Interventional treatment options: Planned, scheduled, and/or pending:   The plan is to do a right intra-articular hip joint injection first, then followed by a diagnostic bilateral lumbar facet block.  If the second one provide symptom with good relief of the pain, this would signify that the prior radiofrequency ablation has worn off and we would then have it repeated.  The last one was done by Dr. Park Breed before retiring.   Under consideration NOTE: Xarelto Anticoagulation (Stop: 3 days  Restart: 6 hours) Possible right opturator + femoral NB  Possible right opturator + femoral nerve RFA  Diagnostic right SI joint injection #1  Possible right SI joint RFA  Diagnostic right lumbar facet block  Possible right lumbar facet RFA (the patient indicates having had approximately 5 prior RFA)   Therapeutic/palliative (PRN):   Palliative right IA hip joint injection #2  (last done 04/30/2019)       Recent Visits Date Type Provider Dept  08/13/19 Procedure visit Milinda Pointer, MD [facility]  08/05/19 Telemedicine Milinda Pointer, MD [facility]  Showing recent visits within past 90 days and meeting all other requirements   Today's Visits Date Type Provider Dept  08/26/19 Telemedicine Milinda Pointer, MD [facility]  Showing today's visits and meeting all other requirements   Future Appointments No visits were found meeting these conditions.  Showing future appointments within next 90 days and meeting all other requirements   I discussed the assessment and treatment plan with the patient. The patient was provided an opportunity to ask questions and all were answered. The patient agreed with the plan and demonstrated an understanding of the instructions.  Patient advised to call back or seek an in-person evaluation if the symptoms or condition worsens.  Duration of encounter: 16 minutes.  Note by: Gaspar Cola, MD Date: 08/26/2019; Time: 4:26 PM

## 2019-08-26 ENCOUNTER — Other Ambulatory Visit: Payer: Self-pay

## 2019-08-26 ENCOUNTER — Ambulatory Visit: Payer: No Typology Code available for payment source | Attending: Pain Medicine | Admitting: Pain Medicine

## 2019-08-26 DIAGNOSIS — G8929 Other chronic pain: Secondary | ICD-10-CM

## 2019-08-26 DIAGNOSIS — Z712 Person consulting for explanation of examination or test findings: Secondary | ICD-10-CM | POA: Diagnosis not present

## 2019-08-26 DIAGNOSIS — M25551 Pain in right hip: Secondary | ICD-10-CM

## 2019-08-26 DIAGNOSIS — Z7901 Long term (current) use of anticoagulants: Secondary | ICD-10-CM

## 2019-08-26 DIAGNOSIS — M1611 Unilateral primary osteoarthritis, right hip: Secondary | ICD-10-CM

## 2019-08-26 DIAGNOSIS — M545 Low back pain, unspecified: Secondary | ICD-10-CM

## 2019-08-26 MED ORDER — TURMERIC 500 MG PO CAPS: 500.0000 mg | ORAL_CAPSULE | Freq: Every day | ORAL | 0 refills | Status: AC

## 2019-08-26 NOTE — Patient Instructions (Addendum)
Since you cannot take any NSAIDs secondary to the use of a blood thinner, you could consider taking some over-the-counter turmeric 500 mg p.o. daily as a natural anti-inflammatory.    In addition, and also an alternative, you could concentrate more on an anti-inflammatory diet:  [email or web address]

## 2019-12-09 ENCOUNTER — Other Ambulatory Visit: Payer: Self-pay

## 2019-12-09 ENCOUNTER — Encounter: Payer: Self-pay | Admitting: Pain Medicine

## 2019-12-09 ENCOUNTER — Ambulatory Visit: Payer: No Typology Code available for payment source | Attending: Pain Medicine | Admitting: Pain Medicine

## 2019-12-09 VITALS — BP 125/94 | HR 58 | Temp 97.2°F | Resp 16 | Ht 70.5 in | Wt 248.0 lb

## 2019-12-09 DIAGNOSIS — M9904 Segmental and somatic dysfunction of sacral region: Secondary | ICD-10-CM | POA: Diagnosis present

## 2019-12-09 DIAGNOSIS — G8929 Other chronic pain: Secondary | ICD-10-CM | POA: Diagnosis present

## 2019-12-09 DIAGNOSIS — M533 Sacrococcygeal disorders, not elsewhere classified: Secondary | ICD-10-CM | POA: Insufficient documentation

## 2019-12-09 DIAGNOSIS — M160 Bilateral primary osteoarthritis of hip: Secondary | ICD-10-CM | POA: Diagnosis present

## 2019-12-09 DIAGNOSIS — M25551 Pain in right hip: Secondary | ICD-10-CM | POA: Diagnosis present

## 2019-12-09 DIAGNOSIS — Z79899 Other long term (current) drug therapy: Secondary | ICD-10-CM | POA: Diagnosis present

## 2019-12-09 DIAGNOSIS — G894 Chronic pain syndrome: Secondary | ICD-10-CM | POA: Insufficient documentation

## 2019-12-09 DIAGNOSIS — M545 Low back pain: Secondary | ICD-10-CM | POA: Insufficient documentation

## 2019-12-09 NOTE — Patient Instructions (Addendum)
____________________________________________________________________________________________  Blood Thinners  IMPORTANT NOTICE:  If you take any of these, make sure to notify the nursing staff.  Failure to do so may result in injury.  Recommended time intervals to stop and restart blood-thinners, before & after invasive procedures  Generic Name Brand Name Stop Time. Must be stopped at least this long before procedures. After procedures, wait at least this long before re-starting.  Abciximab Reopro 15 days 2 hrs  Alteplase Activase 10 days 10 days  Anagrelide Agrylin    Apixaban Eliquis 3 days 6 hrs  Cilostazol Pletal 3 days 5 hrs  Clopidogrel Plavix 7-10 days 2 hrs  Dabigatran Pradaxa 5 days 6 hrs  Dalteparin Fragmin 24 hours 4 hrs  Dipyridamole Aggrenox 11days 2 hrs  Edoxaban Lixiana; Savaysa 3 days 2 hrs  Enoxaparin  Lovenox 24 hours 4 hrs  Eptifibatide Integrillin 8 hours 2 hrs  Fondaparinux  Arixtra 72 hours 12 hrs  Prasugrel Effient 7-10 days 6 hrs  Reteplase Retavase 10 days 10 days  Rivaroxaban Xarelto 3 days 6 hrs  Ticagrelor Brilinta 5-7 days 6 hrs  Ticlopidine Ticlid 10-14 days 2 hrs  Tinzaparin Innohep 24 hours 4 hrs  Tirofiban Aggrastat 8 hours 2 hrs  Warfarin Coumadin 5 days 2 hrs   Other medications with blood-thinning effects  Product indications Generic (Brand) names Note  Cholesterol Lipitor Stop 4 days before procedure  Blood thinner (injectable) Heparin (LMW or LMWH Heparin) Stop 24 hours before procedure  Cancer Ibrutinib (Imbruvica) Stop 7 days before procedure  Malaria/Rheumatoid Hydroxychloroquine (Plaquenil) Stop 11 days before procedure  Thrombolytics  10 days before or after procedures   Over-the-counter (OTC) Products with blood-thinning effects  Product Common names Stop Time  Aspirin > 325 mg Goody Powders, Excedrin, etc. 11 days  Aspirin ? 81 mg  7 days  Fish oil  4 days  Garlic supplements  7 days  Ginkgo biloba  36 hours  Ginseng  24  hours  NSAIDs Ibuprofen, Naprosyn, etc. 3 days  Vitamin E  4 days   ____________________________________________________________________________________________  ____________________________________________________________________________________________  Preparing for your procedure (without sedation)  Procedure appointments are limited to planned procedures: . No Prescription Refills. . No disability issues will be discussed. . No medication changes will be discussed.  Instructions: . Oral Intake: Do not eat or drink anything for at least 6 hours prior to your procedure. (Exception: Blood Pressure Medication. See below.) . Transportation: Unless otherwise stated by your physician, you may drive yourself after the procedure. . Blood Pressure Medicine: Do not forget to take your blood pressure medicine with a sip of water the morning of the procedure. If your Diastolic (lower reading)is above 100 mmHg, elective cases will be cancelled/rescheduled. . Blood thinners: These will need to be stopped for procedures. Notify our staff if you are taking any blood thinners. Depending on which one you take, there will be specific instructions on how and when to stop it. . Diabetics on insulin: Notify the staff so that you can be scheduled 1st case in the morning. If your diabetes requires high dose insulin, take only  of your normal insulin dose the morning of the procedure and notify the staff that you have done so. . Preventing infections: Shower with an antibacterial soap the morning of your procedure.  . Build-up your immune system: Take 1000 mg of Vitamin C with every meal (3 times a day) the day prior to your procedure. Marland Kitchen Antibiotics: Inform the staff if you have a condition or reason  that requires you to take antibiotics before dental procedures. . Pregnancy: If you are pregnant, call and cancel the procedure. . Sickness: If you have a cold, fever, or any active infections, call and cancel the  procedure. . Arrival: You must be in the facility at least 30 minutes prior to your scheduled procedure. . Children: Do not bring any children with you. . Dress appropriately: Bring dark clothing that you would not mind if they get stained. . Valuables: Do not bring any jewelry or valuables.  Reasons to call and reschedule or cancel your procedure: (Following these recommendations will minimize the risk of a serious complication.) . Surgeries: Avoid having procedures within 2 weeks of any surgery. (Avoid for 2 weeks before or after any surgery). . Flu Shots: Avoid having procedures within 2 weeks of a flu shots or . (Avoid for 2 weeks before or after immunizations). . Barium: Avoid having a procedure within 7-10 days after having had a radiological study involving the use of radiological contrast. (Myelograms, Barium swallow or enema study). . Heart attacks: Avoid any elective procedures or surgeries for the initial 6 months after a "Myocardial Infarction" (Heart Attack). . Blood thinners: It is imperative that you stop these medications before procedures. Let us know if you if you take any blood thinner.  . Infection: Avoid procedures during or within two weeks of an infection (including chest colds or gastrointestinal problems). Symptoms associated with infections include: Localized redness, fever, chills, night sweats or profuse sweating, burning sensation when voiding, cough, congestion, stuffiness, runny nose, sore throat, diarrhea, nausea, vomiting, cold or Flu symptoms, recent or current infections. It is specially important if the infection is over the area that we intend to treat. Marland Kitchen Heart and lung problems: Symptoms that may suggest an active cardiopulmonary problem include: cough, chest pain, breathing difficulties or shortness of breath, dizziness, ankle swelling, uncontrolled high or unusually low blood pressure, and/or palpitations. If you are experiencing any of these symptoms, cancel your  procedure and contact your primary care physician for an evaluation.  Remember:  Regular Business hours are:  Monday to Thursday 8:00 AM to 4:00 PM  Provider's Schedule: Milinda Pointer, MD:  Procedure days: Tuesday and Thursday 7:30 AM to 4:00 PM  Gillis Santa, MD:  Procedure days: Monday and Wednesday 7:30 AM to 4:00 PM ____________________________________________________________________________________________   ____________________________________________________________________________________________  General Risks and Possible Complications  Patient Responsibilities: It is important that you read this as it is part of your informed consent. It is our duty to inform you of the risks and possible complications associated with treatments offered to you. It is your responsibility as a patient to read this and to ask questions about anything that is not clear or that you believe was not covered in this document.  Patient's Rights: You have the right to refuse treatment. You also have the right to change your mind, even after initially having agreed to have the treatment done. However, under this last option, if you wait until the last second to change your mind, you may be charged for the materials used up to that point.  Introduction: Medicine is not an Chief Strategy Officer. Everything in Medicine, including the lack of treatment(s), carries the potential for danger, harm, or loss (which is by definition: Risk). In Medicine, a complication is a secondary problem, condition, or disease that can aggravate an already existing one. All treatments carry the risk of possible complications. The fact that a side effects or complications occurs, does not imply that  the treatment was conducted incorrectly. It must be clearly understood that these can happen even when everything is done following the highest safety standards.  No treatment: You can choose not to proceed with the proposed treatment  alternative. The "PRO(s)" would include: avoiding the risk of complications associated with the therapy. The "CON(s)" would include: not getting any of the treatment benefits. These benefits fall under one of three categories: diagnostic; therapeutic; and/or palliative. Diagnostic benefits include: getting information which can ultimately lead to improvement of the disease or symptom(s). Therapeutic benefits are those associated with the successful treatment of the disease. Finally, palliative benefits are those related to the decrease of the primary symptoms, without necessarily curing the condition (example: decreasing the pain from a flare-up of a chronic condition, such as incurable terminal cancer).  General Risks and Complications: These are associated to most interventional treatments. They can occur alone, or in combination. They fall under one of the following six (6) categories: no benefit or worsening of symptoms; bleeding; infection; nerve damage; allergic reactions; and/or death. 1. No benefits or worsening of symptoms: In Medicine there are no guarantees, only probabilities. No healthcare provider can ever guarantee that a medical treatment will work, they can only state the probability that it may. Furthermore, there is always the possibility that the condition may worsen, either directly, or indirectly, as a consequence of the treatment. 2. Bleeding: This is more common if the patient is taking a blood thinner, either prescription or over the counter (example: Goody Powders, Fish oil, Aspirin, Garlic, etc.), or if suffering a condition associated with impaired coagulation (example: Hemophilia, cirrhosis of the liver, low platelet counts, etc.). However, even if you do not have one on these, it can still happen. If you have any of these conditions, or take one of these drugs, make sure to notify your treating physician. 3. Infection: This is more common in patients with a compromised immune  system, either due to disease (example: diabetes, cancer, human immunodeficiency virus [HIV], etc.), or due to medications or treatments (example: therapies used to treat cancer and rheumatological diseases). However, even if you do not have one on these, it can still happen. If you have any of these conditions, or take one of these drugs, make sure to notify your treating physician. 4. Nerve Damage: This is more common when the treatment is an invasive one, but it can also happen with the use of medications, such as those used in the treatment of cancer. The damage can occur to small secondary nerves, or to large primary ones, such as those in the spinal cord and brain. This damage may be temporary or permanent and it may lead to impairments that can range from temporary numbness to permanent paralysis and/or brain death. 5. Allergic Reactions: Any time a substance or material comes in contact with our body, there is the possibility of an allergic reaction. These can range from a mild skin rash (contact dermatitis) to a severe systemic reaction (anaphylactic reaction), which can result in death. 6. Death: In general, any medical intervention can result in death, most of the time due to an unforeseen complication. ____________________________________________________________________________________________  ____________________________________________________________________________________________  Preparing for your procedure (without sedation)  Procedure appointments are limited to planned procedures: . No Prescription Refills. . No disability issues will be discussed. . No medication changes will be discussed.  Instructions: . Oral Intake: Do not eat or drink anything for at least 6 hours prior to your procedure. (Exception: Blood Pressure Medication. See below.) .  Transportation: Unless otherwise stated by your physician, you may drive yourself after the procedure. . Blood Pressure Medicine: Do  not forget to take your blood pressure medicine with a sip of water the morning of the procedure. If your Diastolic (lower reading)is above 100 mmHg, elective cases will be cancelled/rescheduled. . Blood thinners: These will need to be stopped for procedures. Notify our staff if you are taking any blood thinners. Depending on which one you take, there will be specific instructions on how and when to stop it. . Diabetics on insulin: Notify the staff so that you can be scheduled 1st case in the morning. If your diabetes requires high dose insulin, take only  of your normal insulin dose the morning of the procedure and notify the staff that you have done so. . Preventing infections: Shower with an antibacterial soap the morning of your procedure.  . Build-up your immune system: Take 1000 mg of Vitamin C with every meal (3 times a day) the day prior to your procedure. Marland Kitchen Antibiotics: Inform the staff if you have a condition or reason that requires you to take antibiotics before dental procedures. . Pregnancy: If you are pregnant, call and cancel the procedure. . Sickness: If you have a cold, fever, or any active infections, call and cancel the procedure. . Arrival: You must be in the facility at least 30 minutes prior to your scheduled procedure. . Children: Do not bring any children with you. . Dress appropriately: Bring dark clothing that you would not mind if they get stained. . Valuables: Do not bring any jewelry or valuables.  Reasons to call and reschedule or cancel your procedure: (Following these recommendations will minimize the risk of a serious complication.) . Surgeries: Avoid having procedures within 2 weeks of any surgery. (Avoid for 2 weeks before or after any surgery). . Flu Shots: Avoid having procedures within 2 weeks of a flu shots or . (Avoid for 2 weeks before or after immunizations). . Barium: Avoid having a procedure within 7-10 days after having had a radiological study involving  the use of radiological contrast. (Myelograms, Barium swallow or enema study). . Heart attacks: Avoid any elective procedures or surgeries for the initial 6 months after a "Myocardial Infarction" (Heart Attack). . Blood thinners: It is imperative that you stop these medications before procedures. Let us know if you if you take any blood thinner.  . Infection: Avoid procedures during or within two weeks of an infection (including chest colds or gastrointestinal problems). Symptoms associated with infections include: Localized redness, fever, chills, night sweats or profuse sweating, burning sensation when voiding, cough, congestion, stuffiness, runny nose, sore throat, diarrhea, nausea, vomiting, cold or Flu symptoms, recent or current infections. It is specially important if the infection is over the area that we intend to treat. Marland Kitchen Heart and lung problems: Symptoms that may suggest an active cardiopulmonary problem include: cough, chest pain, breathing difficulties or shortness of breath, dizziness, ankle swelling, uncontrolled high or unusually low blood pressure, and/or palpitations. If you are experiencing any of these symptoms, cancel your procedure and contact your primary care physician for an evaluation.  Remember:  Regular Business hours are:  Monday to Thursday 8:00 AM to 4:00 PM  Provider's Schedule: Milinda Pointer, MD:  Procedure days: Tuesday and Thursday 7:30 AM to 4:00 PM  Gillis Santa, MD:  Procedure days: Monday and Wednesday 7:30 AM to 4:00 PM ____________________________________________________________________________________________  ____________________________________________________________________________________________  Blood Thinners  IMPORTANT NOTICE:  If you take any of these,  make sure to notify the nursing staff.  Failure to do so may result in injury.  Recommended time intervals to stop and restart blood-thinners, before & after invasive procedures   Generic Name Brand Name Stop Time. Must be stopped at least this long before procedures. After procedures, wait at least this long before re-starting.  Abciximab Reopro 15 days 2 hrs  Alteplase Activase 10 days 10 days  Anagrelide Agrylin    Apixaban Eliquis 3 days 6 hrs  Cilostazol Pletal 3 days 5 hrs  Clopidogrel Plavix 7-10 days 2 hrs  Dabigatran Pradaxa 5 days 6 hrs  Dalteparin Fragmin 24 hours 4 hrs  Dipyridamole Aggrenox 11days 2 hrs  Edoxaban Lixiana; Savaysa 3 days 2 hrs  Enoxaparin  Lovenox 24 hours 4 hrs  Eptifibatide Integrillin 8 hours 2 hrs  Fondaparinux  Arixtra 72 hours 12 hrs  Prasugrel Effient 7-10 days 6 hrs  Reteplase Retavase 10 days 10 days  Rivaroxaban Xarelto 3 days 6 hrs  Ticagrelor Brilinta 5-7 days 6 hrs  Ticlopidine Ticlid 10-14 days 2 hrs  Tinzaparin Innohep 24 hours 4 hrs  Tirofiban Aggrastat 8 hours 2 hrs  Warfarin Coumadin 5 days 2 hrs   Other medications with blood-thinning effects  Product indications Generic (Brand) names Note  Cholesterol Lipitor Stop 4 days before procedure  Blood thinner (injectable) Heparin (LMW or LMWH Heparin) Stop 24 hours before procedure  Cancer Ibrutinib (Imbruvica) Stop 7 days before procedure  Malaria/Rheumatoid Hydroxychloroquine (Plaquenil) Stop 11 days before procedure  Thrombolytics  10 days before or after procedures   Over-the-counter (OTC) Products with blood-thinning effects  Product Common names Stop Time  Aspirin > 325 mg Goody Powders, Excedrin, etc. 11 days  Aspirin ? 81 mg  7 days  Fish oil  4 days  Garlic supplements  7 days  Ginkgo biloba  36 hours  Ginseng  24 hours  NSAIDs Ibuprofen, Naprosyn, etc. 3 days  Vitamin E  4 days   ____________________________________________________________________________________________ Sacroiliac (SI) Joint Injection Patient Information  Description: The sacroiliac joint connects the scrum (very low back and tailbone) to the ilium (a pelvic bone which also  forms half of the hip joint).  Normally this joint experiences very little motion.  When this joint becomes inflamed or unstable low back and or hip and pelvis pain may result.  Injection of this joint with local anesthetics (numbing medicines) and steroids can provide diagnostic information and reduce pain.  This injection is performed with the aid of x-ray guidance into the tailbone area while you are lying on your stomach.   You may experience an electrical sensation down the leg while this is being done.  You may also experience numbness.  We also may ask if we are reproducing your normal pain during the injection.  Conditions which may be treated SI injection:   Low back, buttock, hip or leg pain  Preparation for the Injection:  1. Do not eat any solid food or dairy products within 8 hours of your appointment.  2. You may drink clear liquids up to 3 hours before appointment.  Clear liquids include water, black coffee, juice or soda.  No milk or cream please. 3. You may take your regular medications, including pain medications with a sip of water before your appointment.  Diabetics should hold regular insulin (if take separately) and take 1/2 normal NPH dose the morning of the procedure.  Carry some sugar containing items with you to your appointment. 4. A driver must accompany you and  be prepared to drive you home after your procedure. 5. Bring all of your current medications with you. 6. An IV may be inserted and sedation may be given at the discretion of the physician. 7. A blood pressure cuff, EKG and other monitors will often be applied during the procedure.  Some patients may need to have extra oxygen administered for a short period.  8. You will be asked to provide medical information, including your allergies, prior to the procedure.  We must know immediately if you are taking blood thinners (like Coumadin/Warfarin) or if you are allergic to IV iodine contrast (dye).  We must know if you  could possible be pregnant.  Possible side effects:   Bleeding from needle site  Infection (rare, may require surgery)  Nerve injury (rare)  Numbness & tingling (temporary)  A brief convulsion or seizure  Light-headedness (temporary)  Pain at injection site (several days)  Decreased blood pressure (temporary)  Weakness in the leg (temporary)   Call if you experience:   New onset weakness or numbness of an extremity below the injection site that last more than 8 hours.  Hives or difficulty breathing ( go to the emergency room)  Inflammation or drainage at the injection site  Any new symptoms which are concerning to you  Please note:  Although the local anesthetic injected can often make your back/ hip/ buttock/ leg feel good for several hours after the injections, the pain will likely return.  It takes 3-7 days for steroids to work in the sacroiliac area.  You may not notice any pain relief for at least that one week.  If effective, we will often do a series of three injections spaced 3-6 weeks apart to maximally decrease your pain.  After the initial series, we generally will wait some months before a repeat injection of the same type.  If you have any questions, please call [phone] [facility]

## 2019-12-09 NOTE — Progress Notes (Signed)
PROVIDER NOTE: Information contained herein reflects review and annotations entered in association with encounter. Interpretation of such information and data should be left to medically-trained personnel. Information provided to patient can be located elsewhere in the medical record under "Patient Instructions". Document created using STT-dictation technology, any transcriptional errors that may result from process are unintentional.    Patient: [name] Whitaker  Service Category: E/M  Provider: Gaspar Cola, MD  DOB: 11/13/1950  DOS: 12/09/2019  Specialty: Interventional Pain Management  MRN: [id number]  Setting: Ambulatory outpatient  PCP: Leighton Ruff, MD  Type: Established Patient    Referring Provider: Leighton Ruff, MD  Location: Office  Delivery: Face-to-face     HPI  Reason for encounter: [name] Whitaker, a 69 y.o. year old male, is here today for evaluation and management of his Chronic pain syndrome [G89.4]. [name] Whitaker's primary complain today is Hip Pain (right ) and Back Pain (lumbar bilateral ) Last encounter: Practice (08/14/2019). My last encounter with him was on 08/13/2019. 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 Pain Assessment: Severity of Chronic pain is reported as a 5 /10. Location: Hip (back lumbar) Right/back pain radiates to both sides.. Onset: More than a month ago. Quality: Discomfort, Sharp, Constant (pulsating and dull). Timing: Constant. Modifying factor(s): staying put in one place.. Vitals:  height is 5' 10.5" (1.791 m) and weight is 248 lb (112.5 kg). His temporal temperature is 97.2 F (36.2 C) (abnormal). His blood pressure is 125/94 (abnormal) and his pulse is 58 (abnormal). His respiration is 16 and oxygen saturation is 99%.   Today the patient returns to the clinic today indicating that he has been given approval for treatment of his chronic low back pain.  Physical exam today as demonstrated no pain coming from the right hip joint.  Provocative Patrick maneuver was positive for bilateral sacroiliac joint pain and left hip arthralgia.  However, in the case of the right hip this did not hurt at all when performing the provocative maneuver suggesting that the intra-articular hip joint injections have helped with that particular pain.  However hyperextension and rotation was also positive for bilateral lumbar facet pain however, he was able to distinguish between the pain from the facet joint and the SI joint and he indicates that it is the one from the SI joint that it is bothering him.  In view of this, I will have him come in for a diagnostic bilateral intra-articular hip joint injection under fluoroscopic guidance and IV sedation.  Last IA right hip joint injection was on 08/13/2019.  According to the patient he attained 50% long-term benefit from the procedure, however during today's physical exam it was clear to me that he attained 100% relief of the right hip as I was unable to provoke any pain upon doing provocative maneuvers on that right hip.  However, the patient did proved to  be having pain from the SI joints,  bilaterally.  He also did experience pain on the left hip and on the facet joints, bilaterally.  Pharmacotherapy Assessment   Analgesic: None Highest recorded MME/day: 26.67 mg/day MME/day: 0 mg/day   Monitoring: [place] PMP: PDMP reviewed during this encounter.       Pharmacotherapy: No side-effects or adverse reactions reported. Compliance: No problems identified. Effectiveness: Clinically acceptable.  Janett Billow, RN  12/09/2019  1:31 PM  Sign when Signing Visit Nursing Pain Medication Assessment:  Safety precautions to be maintained throughout the outpatient stay will include: orient to surroundings, keep bed in low position, maintain call bell within reach at all times, provide assistance with transfer out of bed and ambulation.  Medication Inspection Compliance: [name] Whitaker did not comply with our request to bring his pills to be counted. He was reminded that bringing the medication bottles, even when empty, is a requirement.  Medication: None brought in. Pill/Patch Count: None available to be counted. Bottle Appearance: No container available. Did not bring bottle(s) to appointment. Filled Date: N/A Last Medication intake:  has not taken in "a while".  only takes prn and tries not to take them.     UDS: No results found for: SUMMARY   ROS  Constitutional: Denies any fever or chills Gastrointestinal: No reported hemesis, hematochezia, vomiting, or acute GI distress Musculoskeletal: Denies any acute onset joint swelling, redness, loss of ROM, or weakness Neurological: No reported episodes of acute onset apraxia, aphasia, dysarthria, agnosia, amnesia, paralysis, loss of coordination, or loss of consciousness  Medication Review  Cholecalciferol, EPINEPHrine, Galcanezumab-gnlm, allopurinol, chlorthalidone, cyclobenzaprine, famotidine, finasteride, gabapentin, glipiZIDE, ketoconazole, loratadine, losartan, methocarbamol, potassium chloride SA, propranolol, rivaroxaban, and  traMADol  History Review  Allergy: [name] Whitaker is allergic to penicillins, amlodipine, erenumab-aooe, and lisinopril. Drug: [name] Whitaker  reports no history of drug use. Alcohol:  reports no history of alcohol use. Tobacco:  reports that he quit smoking about 36 years ago. His smoking use included pipe. He quit after 4.00 years of use. He has never used smokeless tobacco. Social: [name] Whitaker  reports that he quit smoking about 36 years ago. His smoking use included pipe. He quit after 4.00 years of use. He has never used smokeless tobacco. He reports that he does not drink alcohol and does not use drugs. Medical:  has a past medical history of Gout, Hypercholesteremia, Hypertension, Migraine, OSA (obstructive sleep apnea), and Pulmonary embolism ([place]). Surgical: [name] Whitaker  has a past surgical history that includes Appendectomy; Nasal septum surgery; Tonsillectomy; Testicle surgery; Cardiac catheterization; and Back surgery. Family: family history includes Clotting disorder in his brother; Hypertension in his father and mother.  Laboratory Chemistry Profile   Renal Lab Results  Component Value Date   BUN 15 04/15/2019   CREATININE 1.36 (H) 04/15/2019   BCR 11 04/15/2019   GFRAA 62 04/15/2019   GFRNONAA 54 (L) 04/15/2019     Hepatic Lab Results  Component Value Date   AST 20 04/15/2019   ALT 22 09/03/2013   ALBUMIN 4.6 04/15/2019   ALKPHOS 79 04/15/2019   LIPASE 19 03/15/2007     Electrolytes Lab Results  Component Value Date   NA 145 (H) 04/15/2019   K 4.0 04/15/2019   CL 102 04/15/2019   CALCIUM 9.8 04/15/2019   MG 2.2 04/15/2019     Bone Lab Results  Component Value Date   25OHVITD1 43 04/15/2019   25OHVITD2 <1.0 04/15/2019   25OHVITD3 43 04/15/2019  Inflammation (CRP: Acute Phase) (ESR: Chronic Phase) Lab Results  Component Value Date   CRP 2 04/15/2019   ESRSEDRATE 6 04/15/2019       Note: Above Lab results reviewed.  Recent Imaging Review  DG  PAIN CLINIC C-ARM 1-60 MIN NO REPORT Fluoro was used, but no Radiologist interpretation will be provided.  Please refer to "NOTES" tab for provider progress note. Note: Reviewed        Physical Exam  General appearance: Well nourished, well developed, and well hydrated. In no apparent acute distress Mental status: Alert, oriented x 3 (person, place, & time)       Respiratory: No evidence of acute respiratory distress Eyes: PERLA Vitals: BP (!) 125/94 (BP Location: Right Arm, Patient Position: Sitting, Cuff Size: Normal)   Pulse (!) 58   Temp (!) 97.2 F (36.2 C) (Temporal)   Resp 16   Ht 5' 10.5" (1.791 m)   Wt 248 lb (112.5 kg)   SpO2 99%   BMI 35.08 kg/m  BMI: Estimated body mass index is 35.08 kg/m as calculated from the following:   Height as of this encounter: 5' 10.5" (1.791 m).   Weight as of this encounter: 248 lb (112.5 kg). Ideal: Ideal body weight: 74.1 kg (163 lb 7.5 oz) Adjusted ideal body weight: 89.5 kg (197 lb 4.5 oz)  Assessment   Status Diagnosis  Controlled Controlled Controlled 1. Chronic pain syndrome   2. Chronic low back pain (Primary Area of Pain) (Right) w/o sciatica   3. Chronic hip pain (Secondary area of Pain) (Right)   4. Pharmacologic therapy   5. Chronic sacroiliac joint pain (Bilateral) (R>L)   6. Osteoarthritis of hips (Bilateral)   7. Somatic dysfunction of sacroiliac joints (Bilateral) (R>L)      Updated Problems: Problem  Osteoarthritis of hips (Bilateral)  Somatic dysfunction of sacroiliac joints (Bilateral) (R>L)  Chronic hip pain (Bilateral)  Chronic low back pain (1ry area of Pain) (Bilateral) (R>L) w/o sciatica  Chronic sacroiliac joint pain (Bilateral) (R>L)    Plan of Care  Problem-specific:  No problem-specific Assessment & Plan notes found for this encounter.  [name] Whitaker has a current medication list which includes the following long-term medication(s): allopurinol, chlorthalidone, famotidine, glipizide,  loratadine, losartan, potassium chloride sa, propranolol, and rivaroxaban.  Pharmacotherapy (Medications Ordered): No orders of the defined types were placed in this encounter.  Orders:  Orders Placed This Encounter  Procedures  . SACROILIAC JOINT INJECTION    Standing Status:   Future    Standing Expiration Date:   01/09/2020    Scheduling Instructions:     Side: Bilateral     Sedation: Patient's choice.     Timeframe: ASAP    Order Specific Question:   Where will this procedure be performed?    Answer:   [facility]   Follow-up plan:   Return for Procedure (no sedation): (B) SI joint inj. #1, (Blood Thinner Protocol).      Interventional treatment options: Planned, scheduled, and/or pending:   The plan is to do a right intra-articular hip joint injection first, then followed by a diagnostic bilateral lumbar facet block.  If the second one provide symptom with good relief of the pain, this would signify that the prior radiofrequency ablation has worn off and we would then have it repeated.  The last one was done by Dr. Park Whitaker before retiring.   Under consideration NOTE: Xarelto Anticoagulation (Stop: 3 days  Restart: 6 hours) Possible right opturator +  femoral NB  Possible right opturator + femoral nerve RFA  Diagnostic right SI joint injection #1  Possible right SI joint RFA  Diagnostic right lumbar facet block  Possible right lumbar facet RFA (the patient indicates having had approximately 5 prior RFA)   Therapeutic/palliative (PRN):   Palliative right IA hip joint injection #2  (last done 04/30/2019)        Recent Visits No visits were found meeting these conditions. Showing recent visits within past 90 days and meeting all other requirements Today's Visits Date Type Provider Dept  12/09/19 Office Visit Milinda Pointer, MD [facility]  Showing today's visits and meeting all other requirements Future Appointments No visits were found  meeting these conditions. Showing future appointments within next 90 days and meeting all other requirements  I discussed the assessment and treatment plan with the patient. The patient was provided an opportunity to ask questions and all were answered. The patient agreed with the plan and demonstrated an understanding of the instructions.  Patient advised to call back or seek an in-person evaluation if the symptoms or condition worsens.  Duration of encounter: 30 minutes.  Note by: Gaspar Cola, MD Date: 12/09/2019; Time: 2:01 PM

## 2019-12-09 NOTE — Progress Notes (Addendum)
Safety precautions to be maintained throughout the outpatient stay will include: orient to surroundings, keep bed in low position, maintain call bell within reach at all times, provide assistance with transfer out of bed and ambulation.  

## 2019-12-12 ENCOUNTER — Ambulatory Visit: Payer: No Typology Code available for payment source | Admitting: Pain Medicine

## 2019-12-17 ENCOUNTER — Ambulatory Visit (HOSPITAL_BASED_OUTPATIENT_CLINIC_OR_DEPARTMENT_OTHER): Payer: No Typology Code available for payment source | Admitting: Pain Medicine

## 2019-12-17 ENCOUNTER — Encounter: Payer: Self-pay | Admitting: Pain Medicine

## 2019-12-17 ENCOUNTER — Other Ambulatory Visit: Payer: Self-pay

## 2019-12-17 ENCOUNTER — Ambulatory Visit
Admission: RE | Admit: 2019-12-17 | Discharge: 2019-12-17 | Disposition: A | Discharge status: Home / Self Care | Payer: No Typology Code available for payment source | Source: Ambulatory Visit | Attending: Pain Medicine | Admitting: Pain Medicine

## 2019-12-17 VITALS — BP 148/95 | HR 72 | Temp 97.0°F | Resp 16 | Ht 71.0 in | Wt 248.0 lb

## 2019-12-17 DIAGNOSIS — Z7901 Long term (current) use of anticoagulants: Secondary | ICD-10-CM | POA: Diagnosis not present

## 2019-12-17 DIAGNOSIS — Z880 Allergy status to penicillin: Secondary | ICD-10-CM | POA: Diagnosis not present

## 2019-12-17 DIAGNOSIS — M545 Low back pain, unspecified: Secondary | ICD-10-CM

## 2019-12-17 DIAGNOSIS — M533 Sacrococcygeal disorders, not elsewhere classified: Secondary | ICD-10-CM

## 2019-12-17 DIAGNOSIS — Z888 Allergy status to other drugs, medicaments and biological substances status: Secondary | ICD-10-CM | POA: Insufficient documentation

## 2019-12-17 DIAGNOSIS — G8929 Other chronic pain: Secondary | ICD-10-CM | POA: Diagnosis not present

## 2019-12-17 DIAGNOSIS — Z79899 Other long term (current) drug therapy: Secondary | ICD-10-CM | POA: Insufficient documentation

## 2019-12-17 DIAGNOSIS — M47898 Other spondylosis, sacral and sacrococcygeal region: Secondary | ICD-10-CM | POA: Insufficient documentation

## 2019-12-17 MED ORDER — ROPIVACAINE HCL 2 MG/ML IJ SOLN
9.0000 mL | Freq: Once | INTRAMUSCULAR | Status: AC
  Administered 2019-12-17: 9 mL via INTRA_ARTICULAR

## 2019-12-17 MED ORDER — LIDOCAINE HCL 2 % IJ SOLN
INTRAMUSCULAR | Status: AC
  Filled 2019-12-17: qty 10

## 2019-12-17 MED ORDER — LIDOCAINE HCL 2 % IJ SOLN
20.0000 mL | Freq: Once | INTRAMUSCULAR | Status: AC
  Administered 2019-12-17: 200 mg

## 2019-12-17 MED ORDER — ROPIVACAINE HCL 2 MG/ML IJ SOLN
INTRAMUSCULAR | Status: AC
  Filled 2019-12-17: qty 10

## 2019-12-17 MED ORDER — METHYLPREDNISOLONE ACETATE 80 MG/ML IJ SUSP
INTRAMUSCULAR | Status: AC
  Filled 2019-12-17: qty 1

## 2019-12-17 MED ORDER — METHYLPREDNISOLONE ACETATE 80 MG/ML IJ SUSP
80.0000 mg | Freq: Once | INTRAMUSCULAR | Status: AC
  Administered 2019-12-17: 80 mg via INTRA_ARTICULAR

## 2019-12-17 NOTE — Progress Notes (Signed)
PROVIDER NOTE: Information contained herein reflects review and annotations entered in association with encounter. Interpretation of such information and data should be left to medically-trained personnel. Information provided to patient can be located elsewhere in the medical record under "Patient Instructions". Document created using STT-dictation technology, any transcriptional errors that may result from process are unintentional.    Patient: [name] Whitaker  Service Category: Procedure  Provider: Gaspar Cola, MD  DOB: 1950/09/19  DOS: 12/17/2019  Location: [facility]  MRN: [id number]  Setting: Ambulatory - outpatient  Referring Provider: Leighton Ruff, MD  Type: Established Patient  Specialty: Interventional Pain Management  PCP: Leighton Ruff, MD   Primary Reason for Visit: Interventional Pain Management Treatment. CC: Back Pain (low left, right mid)  Procedure:          Anesthesia, Analgesia, Anxiolysis:  Type: Diagnostic Sacroiliac Joint Steroid Injection #1  Region: Superior Lumbosacral Region Level: PSIS (Posterior Superior Iliac Spine) Laterality: Bilateral  Type: Local Anesthesia Indication(s): Analgesia         Route: Infiltration ([place]/IM) IV Access: Declined Sedation: Declined  Local Anesthetic: Lidocaine 1-2%  Position: Prone           Indications: 1. Other spondylosis, sacral and sacrococcygeal region   2. Chronic sacroiliac joint pain (Bilateral) (R>L)   3. Chronic low back pain (1ry area of Pain) (Bilateral) (R>L) w/o sciatica   4. Chronic anticoagulation (Xarelto)    Pain Score: Pre-procedure: 7 /10 Post-procedure: 6 /10   Pre-op Assessment:  [name] Whitaker is a 69 y.o. (year old), male patient, seen today for interventional treatment. He  has a past surgical history that includes Appendectomy; Nasal septum surgery; Tonsillectomy; Testicle surgery; Cardiac catheterization; and Back surgery. [name] Whitaker has a current medication list  which includes the following prescription(s): allopurinol, chlorthalidone, cholecalciferol, cyclobenzaprine, epinephrine, famotidine, finasteride, gabapentin, galcanezumab-gnlm, glipizide, ketoconazole, loratadine, losartan, methocarbamol, potassium chloride sa, propranolol, rivaroxaban, and tramadol. His primarily concern today is the Back Pain (low left, right mid)  Initial Vital Signs:  Pulse/HCG Rate: 73  Temp: (!) 97 F (36.1 C) Resp: 18 BP: (!) 133/105 SpO2: 99 %  BMI: Estimated body mass index is 34.59 kg/m as calculated from the following:   Height as of this encounter: 5\' 11"  (1.803 m).   Weight as of this encounter: 248 lb (112.5 kg).  Risk Assessment: Allergies: Reviewed. He is allergic to penicillins, amlodipine, erenumab-aooe, and lisinopril.  Allergy Precautions: None required Coagulopathies: Reviewed. None identified.  Blood-thinner therapy: None at this time Active Infection(s): Reviewed. None identified. [name] Whitaker is afebrile  Site Confirmation: [name] Whitaker was asked to confirm the procedure and laterality before marking the site Procedure checklist: Completed Consent: Before the procedure and under the influence of no sedative(s), amnesic(s), or anxiolytics, the patient was informed of the treatment options, risks and possible complications. To fulfill our ethical and legal obligations, as recommended by the American Medical Association's Code of Ethics, I have informed the patient of my clinical impression; the nature and purpose of the treatment or procedure; the risks, benefits, and possible complications of the intervention; the alternatives, including doing nothing; the risk(s) and benefit(s) of the alternative treatment(s) or procedure(s); and the risk(s) and benefit(s) of doing nothing. The patient was provided information about the general risks and possible complications associated with the procedure. These may include, but are not limited to: failure to  achieve desired goals, infection, bleeding, organ or nerve damage, allergic reactions, paralysis, and death. In addition, the patient was informed of those  risks and complications associated to the procedure, such as failure to decrease pain; infection; bleeding; organ or nerve damage with subsequent damage to sensory, motor, and/or autonomic systems, resulting in permanent pain, numbness, and/or weakness of one or several areas of the body; allergic reactions; (i.e.: anaphylactic reaction); and/or death. Furthermore, the patient was informed of those risks and complications associated with the medications. These include, but are not limited to: allergic reactions (i.e.: anaphylactic or anaphylactoid reaction(s)); adrenal axis suppression; blood sugar elevation that in diabetics may result in ketoacidosis or comma; water retention that in patients with history of congestive heart failure may result in shortness of breath, pulmonary edema, and decompensation with resultant heart failure; weight gain; swelling or edema; medication-induced neural toxicity; particulate matter embolism and blood vessel occlusion with resultant organ, and/or nervous system infarction; and/or aseptic necrosis of one or more joints. Finally, the patient was informed that Medicine is not an exact science; therefore, there is also the possibility of unforeseen or unpredictable risks and/or possible complications that may result in a catastrophic outcome. The patient indicated having understood very clearly. We have given the patient no guarantees and we have made no promises. Enough time was given to the patient to ask questions, all of which were answered to the patient's satisfaction. [name] Whitaker has indicated that he wanted to continue with the procedure. Attestation: I, the ordering provider, attest that I have discussed with the patient the benefits, risks, side-effects, alternatives, likelihood of achieving goals, and potential  problems during recovery for the procedure that I have provided informed consent. Date  Time: 12/17/2019  7:58 AM  Pre-Procedure Preparation:  Monitoring: As per clinic protocol. Respiration, ETCO2, SpO2, BP, heart rate and rhythm monitor placed and checked for adequate function Safety Precautions: Patient was assessed for positional comfort and pressure points before starting the procedure. Time-out: I initiated and conducted the "Time-out" before starting the procedure, as per protocol. The patient was asked to participate by confirming the accuracy of the "Time Out" information. Verification of the correct person, site, and procedure were performed and confirmed by me, the nursing staff, and the patient. "Time-out" conducted as per Joint Commission's Universal Protocol (UP.01.01.01). Time: 0826  Description of Procedure:          Target Area: Superior, posterior, aspect of the sacroiliac fissure Approach: Posterior, paraspinal, ipsilateral approach. Area Prepped: Entire Lower Lumbosacral Region DuraPrep (Iodine Povacrylex [0.7% available iodine] and Isopropyl Alcohol, 74% w/w) Safety Precautions: Aspiration looking for blood return was conducted prior to all injections. At no point did we inject any substances, as a needle was being advanced. No attempts were made at seeking any paresthesias. Safe injection practices and needle disposal techniques used. Medications properly checked for expiration dates. SDV (single dose vial) medications used. Description of the Procedure: Protocol guidelines were followed. The patient was placed in position over the procedure table. The target area was identified and the area prepped in the usual manner. Skin & deeper tissues infiltrated with local anesthetic. Appropriate amount of time allowed to pass for local anesthetics to take effect. The procedure needle was advanced under fluoroscopic guidance into the sacroiliac joint until a firm endpoint was obtained.  Proper needle placement secured. Negative aspiration confirmed. Solution injected in intermittent fashion, asking for systemic symptoms every 0.5cc of injectate. The needles were then removed and the area cleansed, making sure to leave some of the prepping solution back to take advantage of its long term bactericidal properties. Vitals:   12/17/19 0825 12/17/19 0830  12/17/19 0835 12/17/19 0840  BP: (!) 152/110 (!) 135/100 (!) 143/96 (!) 148/95  Pulse: 73 68 72 72  Resp: 21 16 18 16   Temp:      SpO2: 97% 96% 100% 98%  Weight:      Height:        Start Time: 0826 hrs. End Time: 0836 hrs. Materials:  Needle(s) Type: Spinal Needle Gauge: 22G Length: 3.5-in Medication(s): Please see orders for medications and dosing details.  Imaging Guidance (Non-Spinal):          Type of Imaging Technique: Fluoroscopy Guidance (Non-Spinal) Indication(s): Assistance in needle guidance and placement for procedures requiring needle placement in or near specific anatomical locations not easily accessible without such assistance. Exposure Time: Please see nurses notes. Contrast: Before injecting any contrast, we confirmed that the patient did not have an allergy to iodine, shellfish, or radiological contrast. Once satisfactory needle placement was completed at the desired level, radiological contrast was injected. Contrast injected under live fluoroscopy. No contrast complications. See chart for type and volume of contrast used. Fluoroscopic Guidance: I was personally present during the use of fluoroscopy. "Tunnel Vision Technique" used to obtain the best possible view of the target area. Parallax error corrected before commencing the procedure. "Direction-depth-direction" technique used to introduce the needle under continuous pulsed fluoroscopy. Once target was reached, antero-posterior, oblique, and lateral fluoroscopic projection used confirm needle placement in all planes. Images permanently stored in  EMR. Interpretation: I personally interpreted the imaging intraoperatively. Adequate needle placement confirmed in multiple planes. Appropriate spread of contrast into desired area was observed. No evidence of afferent or efferent intravascular uptake. Permanent images saved into the patient's record.  Antibiotic Prophylaxis:   Anti-infectives (From admission, onward)   None     Indication(s): None identified  Post-operative Assessment:  Post-procedure Vital Signs:  Pulse/HCG Rate: 72  Temp: (!) 97 F (36.1 C) Resp: 16 BP: (!) 148/95 SpO2: 98 %  EBL: None  Complications: No immediate post-treatment complications observed by team, or reported by patient.  Note: The patient tolerated the entire procedure well. A repeat set of vitals were taken after the procedure and the patient was kept under observation following institutional policy, for this type of procedure. Post-procedural neurological assessment was performed, showing return to baseline, prior to discharge. The patient was provided with post-procedure discharge instructions, including a section on how to identify potential problems. Should any problems arise concerning this procedure, the patient was given instructions to immediately contact us, at any time, without hesitation. In any case, we plan to contact the patient by telephone for a follow-up status report regarding this interventional procedure.  Comments:  No additional relevant information.  Plan of Care  Orders:  Orders Placed This Encounter  Procedures  . SACROILIAC JOINT INJECTION    Scheduling Instructions:     Side: Bilateral     Sedation: No Sedation.     Timeframe: Today    Order Specific Question:   Where will this procedure be performed?    Answer:   [facility]  . [facility] C-ARM 1-60 MIN NO REPORT    Intraoperative interpretation by procedural physician at [facility].    Standing Status:   Standing    Number of  Occurrences:   1    Order Specific Question:   Reason for exam:    Answer:   Assistance in needle guidance and placement for procedures requiring needle placement in or near specific anatomical locations not easily accessible  without such assistance.  . Informed Consent Details: Physician/Practitioner Attestation; Transcribe to consent form and obtain patient signature    Provider Attestation: I, [place] Dossie Arbour, MD, (Pain Management Specialist), the physician/practitioner, attest that I have discussed with the patient the benefits, risks, side effects, alternatives, likelihood of achieving goals and potential problems during recovery for the procedure that I have provided informed consent.    Scheduling Instructions:     Procedure: Sacroiliac Joint Block     Indication/Reason: Chronic Low Back and Hip Pain secondary to Sacroiliac Joint Pain (Arthralgia/Arthropathy)     Nursing Order: Transcribe to consent form and obtain patient signature.     Note: Always confirm laterality of pain with Mr. Quinney, before procedure.  . Care order/instruction: Please confirm that the patient has stopped the Xarelto (Rivaroxaban) x 3 days prior to procedure or surgery.    Please confirm that the patient has stopped the Xarelto (Rivaroxaban) x 3 days prior to procedure or surgery.    Standing Status:   Standing    Number of Occurrences:   1  . Provide equipment / supplies at bedside    Equipment required: Single use, disposable, "Block Tray"    Standing Status:   Standing    Number of Occurrences:   1    Order Specific Question:   Specify    Answer:   Block Tray  . Bleeding precautions    Standing Status:   Standing    Number of Occurrences:   1   Chronic Opioid Analgesic:  None Highest recorded MME/day: 26.67 mg/day MME/day: 0 mg/day   Medications ordered for procedure: Meds ordered this encounter  Medications  . lidocaine (XYLOCAINE) 2 % (with pres) injection 400 mg  . methylPREDNISolone  acetate (DEPO-MEDROL) injection 80 mg  . ropivacaine (PF) 2 mg/mL (0.2%) (NAROPIN) injection 9 mL   Medications administered: We administered lidocaine, methylPREDNISolone acetate, and ropivacaine (PF) 2 mg/mL (0.2%).  See the medical record for exact dosing, route, and time of administration.  Follow-up plan:   Return in about 2 weeks (around 12/31/2019) for 20-min, F2F, PP-(on eval day).       Interventional treatment options: Planned, scheduled, and/or pending:      Under consideration NOTE: Xarelto Anticoagulation (Stop: 3 days  Restart: 6 hours) Possible right opturator + femoral NB  Possible right opturator + femoral nerve RFA  Diagnostic right SI joint injection #1  Possible right SI joint RFA  Diagnostic right lumbar facet block  Possible right lumbar facet RFA (the patient indicates having had approximately 5 prior RFA)   Therapeutic/palliative (PRN):   Palliative right IA hip joint injection #2  (last done 04/30/2019)    Recent Visits Date Type Provider Dept  12/09/19 Office Visit Milinda Pointer, MD [facility]  Showing recent visits within past 90 days and meeting all other requirements Today's Visits Date Type Provider Dept  12/17/19 Procedure visit Milinda Pointer, MD [facility]  Showing today's visits and meeting all other requirements Future Appointments Date Type Provider Dept  01/15/20 Appointment Milinda Pointer, MD [facility]  Showing future appointments within next 90 days and meeting all other requirements  Disposition: Discharge home  Discharge (Date  Time): 12/17/2019; 0844 hrs.   Primary Care Physician: Leighton Ruff, MD Location: [facility] Note by: Gaspar Cola, MD Date: 12/17/2019; Time: 8:58 AM  Disclaimer:  Medicine is not an Chief Strategy Officer. The only guarantee in medicine is that nothing is guaranteed. It is important  to note that the decision to proceed with  this intervention was based on the information collected from the patient. The Data and conclusions were drawn from the patient's questionnaire, the interview, and the physical examination. Because the information was provided in large part by the patient, it cannot be guaranteed that it has not been purposely or unconsciously manipulated. Every effort has been made to obtain as much relevant data as possible for this evaluation. It is important to note that the conclusions that lead to this procedure are derived in large part from the available data. Always take into account that the treatment will also be dependent on availability of resources and existing treatment guidelines, considered by other Pain Management Practitioners as being common knowledge and practice, at the time of the intervention. For Medico-Legal purposes, it is also important to point out that variation in procedural techniques and pharmacological choices are the acceptable norm. The indications, contraindications, technique, and results of the above procedure should only be interpreted and judged by a Board-Certified Interventional Pain Specialist with extensive familiarity and expertise in the same exact procedure and technique.

## 2019-12-17 NOTE — Patient Instructions (Addendum)
____________________________________________________________________________________________  Post-Procedure Discharge Instructions  Instructions:  Apply ice:   Purpose: This will minimize any swelling and discomfort after procedure.   When: Day of procedure, as soon as you get home.  How: Fill a plastic sandwich bag with crushed ice. Cover it with a small towel and apply to injection site.  How long: (15 min on, 15 min off) Apply for 15 minutes then remove x 15 minutes.  Repeat sequence on day of procedure, until you go to bed.  Apply heat:   Purpose: To treat any soreness and discomfort from the procedure.  When: Starting the next day after the procedure.  How: Apply heat to procedure site starting the day following the procedure.  How long: May continue to repeat daily, until discomfort goes away.  Food intake: Start with clear liquids (like water) and advance to regular food, as tolerated.   Physical activities: Keep activities to a minimum for the first 8 hours after the procedure. After that, then as tolerated.  Driving: If you have received any sedation, be responsible and do not drive. You are not allowed to drive for 24 hours after having sedation.  Blood thinner: (Applies only to those taking blood thinners) You may restart your blood thinner 6 hours after your procedure.  Insulin: (Applies only to Diabetic patients taking insulin) As soon as you can eat, you may resume your normal dosing schedule.  Infection prevention: Keep procedure site clean and dry. Shower daily and clean area with soap and water.  Post-procedure Pain Diary: Extremely important that this be done correctly and accurately. Recorded information will be used to determine the next step in treatment. For the purpose of accuracy, follow these rules:  Evaluate only the area treated. Do not report or include pain from an untreated area. For the purpose of this evaluation, ignore all other areas of pain,  except for the treated area.  After your procedure, avoid taking a long nap and attempting to complete the pain diary after you wake up. Instead, set your alarm clock to go off every hour, on the hour, for the initial 8 hours after the procedure. Document the duration of the numbing medicine, and the relief you are getting from it.  Do not go to sleep and attempt to complete it later. It will not be accurate. If you received sedation, it is likely that you were given a medication that may cause amnesia. Because of this, completing the diary at a later time may cause the information to be inaccurate. This information is needed to plan your care.  Follow-up appointment: Keep your post-procedure follow-up evaluation appointment after the procedure (usually 2 weeks for most procedures, 6 weeks for radiofrequencies). DO NOT FORGET to bring you pain diary with you.   Expect: (What should I expect to see with my procedure?)  From numbing medicine (AKA: Local Anesthetics): Numbness or decrease in pain. You may also experience some weakness, which if present, could last for the duration of the local anesthetic.  Onset: Full effect within 15 minutes of injected.  Duration: It will depend on the type of local anesthetic used. On the average, 1 to 8 hours.   From steroids (Applies only if steroids were used): Decrease in swelling or inflammation. Once inflammation is improved, relief of the pain will follow.  Onset of benefits: Depends on the amount of swelling present. The more swelling, the longer it will take for the benefits to be seen. In some cases, up to 10 days.    Duration: Steroids will stay in the system x 2 weeks. Duration of benefits will depend on multiple posibilities including persistent irritating factors.  Side-effects: If present, they may typically last 2 weeks (the duration of the steroids).  Frequent: Cramps (if they occur, drink Gatorade and take over-the-counter Magnesium 450-500 mg  once to twice a day); water retention with temporary weight gain; increases in blood sugar; decreased immune system response; increased appetite.  Occasional: Facial flushing (red, warm cheeks); mood swings; menstrual changes.  Uncommon: Long-term decrease or suppression of natural hormones; bone thinning. (These are more common with higher doses or more frequent use. This is why we prefer that our patients avoid having any injection therapies in other practices.)   Very Rare: Severe mood changes; psychosis; aseptic necrosis.  From procedure: Some discomfort is to be expected once the numbing medicine wears off. This should be minimal if ice and heat are applied as instructed.  Call if: (When should I call?)  You experience numbness and weakness that gets worse with time, as opposed to wearing off.  New onset bowel or bladder incontinence. (Applies only to procedures done in the spine)  Emergency Numbers:  Durning business hours (Monday - Thursday, 8:00 AM - 4:00 PM) (Friday, 9:00 AM - 12:00 Noon): [phone]  After hours: [phone]  NOTE: If you are having a problem and are unable connect with, or to talk to a provider, then go to your nearest urgent care or emergency department. If the problem is serious and urgent, please call 911. ____________________________________________________________________________________________  Sacroiliac (SI) Joint Injection Patient Information  Description: The sacroiliac joint connects the scrum (very low back and tailbone) to the ilium (a pelvic bone which also forms half of the hip joint).  Normally this joint experiences very little motion.  When this joint becomes inflamed or unstable low back and or hip and pelvis pain may result.  Injection of this joint with local anesthetics (numbing medicines) and steroids can provide diagnostic information and reduce pain.  This injection is performed with the aid of x-ray guidance into the tailbone  area while you are lying on your stomach.   You may experience an electrical sensation down the leg while this is being done.  You may also experience numbness.  We also may ask if we are reproducing your normal pain during the injection.  Conditions which may be treated SI injection:   Low back, buttock, hip or leg pain  Preparation for the Injection:  1. Do not eat any solid food or dairy products within 8 hours of your appointment.  2. You may drink clear liquids up to 3 hours before appointment.  Clear liquids include water, black coffee, juice or soda.  No milk or cream please. 3. You may take your regular medications, including pain medications with a sip of water before your appointment.  Diabetics should hold regular insulin (if take separately) and take 1/2 normal NPH dose the morning of the procedure.  Carry some sugar containing items with you to your appointment. 4. A driver must accompany you and be prepared to drive you home after your procedure. 5. Bring all of your current medications with you. 6. An IV may be inserted and sedation may be given at the discretion of the physician. 7. A blood pressure cuff, EKG and other monitors will often be applied during the procedure.  Some patients may need to have extra oxygen administered for a short period.  8. You will be asked to   provide medical information, including your allergies, prior to the procedure.  We must know immediately if you are taking blood thinners (like Coumadin/Warfarin) or if you are allergic to IV iodine contrast (dye).  We must know if you could possible be pregnant.  Possible side effects:   Bleeding from needle site  Infection (rare, may require surgery)  Nerve injury (rare)  Numbness & tingling (temporary)  A brief convulsion or seizure  Light-headedness (temporary)  Pain at injection site (several days)  Decreased blood pressure (temporary)  Weakness in the leg (temporary)   Call if you  experience:   New onset weakness or numbness of an extremity below the injection site that last more than 8 hours.  Hives or difficulty breathing ( go to the emergency room)  Inflammation or drainage at the injection site  Any new symptoms which are concerning to you  Please note:  Although the local anesthetic injected can often make your back/ hip/ buttock/ leg feel good for several hours after the injections, the pain will likely return.  It takes 3-7 days for steroids to work in the sacroiliac area.  You may not notice any pain relief for at least that one week.  If effective, we will often do a series of three injections spaced 3-6 weeks apart to maximally decrease your pain.  After the initial series, we generally will wait some months before a repeat injection of the same type.  If you have any questions, please call [phone] [facility]   

## 2019-12-17 NOTE — Progress Notes (Signed)
Safety precautions to be maintained throughout the outpatient stay will include: orient to surroundings, keep bed in low position, maintain call bell within reach at all times, provide assistance with transfer out of bed and ambulation.  

## 2019-12-18 ENCOUNTER — Telehealth: Payer: Self-pay | Admitting: *Deleted

## 2019-12-18 NOTE — Telephone Encounter (Signed)
Attempted to call for post procedure follow-up. No answer, unable to leave a message. Mailbox full.

## 2020-01-07 ENCOUNTER — Other Ambulatory Visit: Payer: Self-pay | Admitting: Neurology

## 2020-01-07 DIAGNOSIS — G43119 Migraine with aura, intractable, without status migrainosus: Secondary | ICD-10-CM

## 2020-01-15 ENCOUNTER — Other Ambulatory Visit: Payer: Self-pay

## 2020-01-15 ENCOUNTER — Encounter: Payer: Self-pay | Admitting: Pain Medicine

## 2020-01-15 ENCOUNTER — Ambulatory Visit: Payer: No Typology Code available for payment source | Attending: Pain Medicine | Admitting: Pain Medicine

## 2020-01-15 VITALS — BP 119/95 | HR 80 | Temp 97.7°F | Resp 16 | Ht 71.0 in | Wt 244.0 lb

## 2020-01-15 DIAGNOSIS — M48061 Spinal stenosis, lumbar region without neurogenic claudication: Secondary | ICD-10-CM | POA: Insufficient documentation

## 2020-01-15 DIAGNOSIS — M545 Low back pain: Secondary | ICD-10-CM | POA: Insufficient documentation

## 2020-01-15 DIAGNOSIS — G8929 Other chronic pain: Secondary | ICD-10-CM | POA: Insufficient documentation

## 2020-01-15 DIAGNOSIS — G894 Chronic pain syndrome: Secondary | ICD-10-CM | POA: Insufficient documentation

## 2020-01-15 DIAGNOSIS — M47816 Spondylosis without myelopathy or radiculopathy, lumbar region: Secondary | ICD-10-CM | POA: Diagnosis present

## 2020-01-15 NOTE — Patient Instructions (Addendum)
Turmeric 500 mg pills. (OTC)  ____________________________________________________________________________________________  Preparing for your procedure (without sedation)  Procedure appointments are limited to planned procedures: . No Prescription Refills. . No disability issues will be discussed. . No medication changes will be discussed.  Instructions: . Oral Intake: Do not eat or drink anything for at least 6 hours prior to your procedure. (Exception: Blood Pressure Medication. See below.) . Transportation: Unless otherwise stated by your physician, you may drive yourself after the procedure. . Blood Pressure Medicine: Do not forget to take your blood pressure medicine with a sip of water the morning of the procedure. If your Diastolic (lower reading)is above 100 mmHg, elective cases will be cancelled/rescheduled. . Blood thinners: These will need to be stopped for procedures. Notify our staff if you are taking any blood thinners. Depending on which one you take, there will be specific instructions on how and when to stop it. . Diabetics on insulin: Notify the staff so that you can be scheduled 1st case in the morning. If your diabetes requires high dose insulin, take only  of your normal insulin dose the morning of the procedure and notify the staff that you have done so. . Preventing infections: Shower with an antibacterial soap the morning of your procedure.  . Build-up your immune system: Take 1000 mg of Vitamin C with every meal (3 times a day) the day prior to your procedure. Marland Kitchen Antibiotics: Inform the staff if you have a condition or reason that requires you to take antibiotics before dental procedures. . Pregnancy: If you are pregnant, call and cancel the procedure. . Sickness: If you have a cold, fever, or any active infections, call and cancel the procedure. . Arrival: You must be in the facility at least 30 minutes prior to your scheduled procedure. . Children: Do not bring any  children with you. . Dress appropriately: Bring dark clothing that you would not mind if they get stained. . Valuables: Do not bring any jewelry or valuables.  Reasons to call and reschedule or cancel your procedure: (Following these recommendations will minimize the risk of a serious complication.) . Surgeries: Avoid having procedures within 2 weeks of any surgery. (Avoid for 2 weeks before or after any surgery). . Flu Shots: Avoid having procedures within 2 weeks of a flu shots or . (Avoid for 2 weeks before or after immunizations). . Barium: Avoid having a procedure within 7-10 days after having had a radiological study involving the use of radiological contrast. (Myelograms, Barium swallow or enema study). . Heart attacks: Avoid any elective procedures or surgeries for the initial 6 months after a "Myocardial Infarction" (Heart Attack). . Blood thinners: It is imperative that you stop these medications before procedures. Let us know if you if you take any blood thinner.  . Infection: Avoid procedures during or within two weeks of an infection (including chest colds or gastrointestinal problems). Symptoms associated with infections include: Localized redness, fever, chills, night sweats or profuse sweating, burning sensation when voiding, cough, congestion, stuffiness, runny nose, sore throat, diarrhea, nausea, vomiting, cold or Flu symptoms, recent or current infections. It is specially important if the infection is over the area that we intend to treat. Marland Kitchen Heart and lung problems: Symptoms that may suggest an active cardiopulmonary problem include: cough, chest pain, breathing difficulties or shortness of breath, dizziness, ankle swelling, uncontrolled high or unusually low blood pressure, and/or palpitations. If you are experiencing any of these symptoms, cancel your procedure and contact your primary care physician for  an evaluation.  Remember:  Regular Business hours are:  Monday to Thursday  8:00 AM to 4:00 PM  Provider's Schedule: Milinda Pointer, MD:  Procedure days: Tuesday and Thursday 7:30 AM to 4:00 PM  Gillis Santa, MD:  Procedure days: Monday and Wednesday 7:30 AM to 4:00 PM ____________________________________________________________________________________________   Preparing for your procedure (without sedation) Instructions: . Oral Intake: Do not eat or drink anything for at least 3 hours prior to your procedure. . Transportation: Unless otherwise stated by your physician, you may drive yourself after the procedure. . Blood Pressure Medicine: Take your blood pressure medicine with a sip of water the morning of the procedure. . Insulin: Take only  of your normal insulin dose. . Preventing infections: Shower with an antibacterial soap the morning of your procedure. . Build-up your immune system: Take 1000 mg of Vitamin C with every meal (3 times a day) the day prior to your procedure. . Pregnancy: If you are pregnant, call and cancel the procedure. . Sickness: If you have a cold, fever, or any active infections, call and cancel the procedure. . Arrival: You must be in the facility at least 30 minutes prior to your scheduled procedure. . Children: Do not bring any children with you. . Dress appropriately: Bring dark clothing that you would not mind if they get stained. . Valuables: Do not bring any jewelry or valuables. Procedure appointments are reserved for interventional treatments only. Marland Kitchen No Prescription Refills. . No medication changes will be discussed during procedure appointments. . No disability issues will be discussed.   [place] [facility] [place]

## 2020-01-15 NOTE — Progress Notes (Signed)
PROVIDER NOTE: Information contained herein reflects review and annotations entered in association with encounter. Interpretation of such information and data should be left to medically-trained personnel. Information provided to patient can be located elsewhere in the medical record under "Patient Instructions". Document created using STT-dictation technology, any transcriptional errors that may result from process are unintentional.    Patient: [name] Whitaker  Service Category: E/M  Provider: Oswaldo Done, MD  DOB: [birth year]  DOS: 01/15/2020  Specialty: Interventional Pain Management  MRN: [id number]  Setting: Ambulatory outpatient  PCP: [name] Rainier, MD  Type: Established Patient    Referring Provider: Juluis Rainier, MD  Location: Office  Delivery: Face-to-face     HPI  Reason for encounter: [name], a 69 y.o. year old male, is here today for evaluation and management of his Chronic pain syndrome [G89.4]. [name] Whitaker's primary complain today is Back Pain (lower) Last encounter: Practice (12/18/2019). My last encounter with him was on 12/17/2019. 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 Pain Assessment: Severity of Chronic pain is reported as a 5 /10. Location: Back Lower/into right SI joint. Onset: More than a month ago. Quality: Aching, Shooting. Timing: Intermittent. Modifying factor(s): rest, injections. Vitals:  height is 5\' 11"  (1.803 m) and weight is 244 lb (110.7 kg). His temporal temperature is 97.7 F (36.5 C). His blood pressure is 119/95 (abnormal) and his pulse is 80. His respiration is 16 and oxygen saturation is 100%.   The patient comes into the clinic today for follow-up after a diagnostic bilateral sacroiliac joint block #1 under fluoroscopic guidance, and no sedation.  He indicates having attained 100% relief of the pain for the duration of the local anesthetic and slowly going down to a 75% relief that lasted approximately 3 weeks.  Unfortunately, last Friday he had 2 shots, 1 for pneumonia and the other one for prevention of the shingles, on the same day.  He describes that after that his low back pain returned including lower extremity pain and pain over his arms.  Basically he is describing generalized pain after the meal shots.  Today I took the time to explain to the patient the possible reasons for this generalized pain and body aches.  I explained to him that it had to do with the activation of the immune system as the T cells are trying to provide him with immunity for the pneumonia and the shingles.  I also took the opportunity to point out to him that this could last approximately 2 weeks and during that time he should not be taking any steroids since it may decrease the effectiveness of these injections.  He understood and accepted.  Today he comes in indicating that while he is sitting down he does not have any pain in the lower back, but when he stands up then he feels  pressure.  Provocative maneuvers today with hyperextension and rotation demonstrated the patient to  have very decreased range of motion on hyperextension as well as on rotation.  Essentially rotated using his legs.  He denied any pain being triggered by the maneuver that he perform even though it was not Whitaker correctly.  However, I also tested the patient with a Patrick maneuver which showed bilateral hip arthralgia, more so on the right side than the left.  It did not trigger any pain in the area of the SI joint, but it was very difficult to read.  Since he would not commit to saying yes or no to pain being in the lower part of the back with the [facility] maneuver.  However, he also describes his low back pain as being in the midline and therefore I went online and checked his last lumbar x-rays.  They demonstrated a decrease space between his spinous processes suggesting the possibility of a "kissing spine syndrome".  This was shown and explained to the patient on the x-rays.  At this point, the plan is to hold on any type of injection for the next 2 weeks and see if his generalized body aches will go away on their own.  If they do then we will need to do anything else.  However, if they continue, we may bring him back for a repeat SI joint injection versus a repeat right hip joint injection versus a diagnostic interspinous process block.  Currently I am not prescribing any medications for this patient.  However, the patient showed me on his phone and image of one of our notes from "MY CHART" system.  In particular, he was not happy with the nurses note where it stated that he was noncompliant with his medication management because he had not brought in any pills when in fact were not prescribing anything for him.  This was brought up today to our clinical manager so that he could inform her of his grievance.  Post-Procedure Evaluation  Procedure (12/17/2019): Diagnostic bilateral sacroiliac joint block #1 under fluoroscopic guidance, no sedation Pre-procedure pain level: 7/10 Post-procedure: 6/10 Limited  initial benefit, possibly due to rapid discharge after no sedation procedure, without enough time to allow full onset of block.  Sedation: None.  Effectiveness during initial hour after procedure(Ultra-Short Term Relief): 100 %.  Local anesthetic used: Long-acting (4-6 hours) Effectiveness: Defined as any analgesic benefit obtained secondary to the administration of local anesthetics. This carries significant diagnostic value as to the etiological location, or anatomical origin, of the pain. Duration of benefit is expected to coincide with the duration of the local anesthetic used.  Effectiveness during initial 4-6 hours after procedure(Short-Term Relief): 100 %.  Long-term benefit: Defined as any relief past the pharmacologic duration of the local anesthetics.  Effectiveness past the initial 6 hours after procedure(Long-Term Relief): 75 % (lasting 3 weeks).  Current benefits: Defined as benefit that persist at this time.   Analgesia:  >50% relief Function: [name] Whitaker reports improvement in function ROM: [name] Whitaker reports improvement in ROM  Pharmacotherapy Assessment   Analgesic: No opioid analgesics prescribed by our practice. Highest recorded MME/day: 26.67 mg/day MME/day: 0 mg/day   Monitoring: [place] PMP: PDMP reviewed during this encounter.       Pharmacotherapy: No side-effects or adverse reactions reported. Compliance: No problems identified. Effectiveness: Clinically acceptable.  No notes on file  UDS: No results found for: SUMMARY   ROS  Constitutional: Denies any fever or chills Gastrointestinal: No reported  hemesis, hematochezia, vomiting, or acute GI distress Musculoskeletal: Denies any acute onset joint swelling, redness, loss of ROM, or weakness Neurological: No reported episodes of acute onset apraxia, aphasia, dysarthria, agnosia, amnesia, paralysis, loss of coordination, or loss of consciousness  Medication Review  Cholecalciferol, EPINEPHrine,  Galcanezumab-gnlm, allopurinol, chlorthalidone, cyclobenzaprine, famotidine, finasteride, gabapentin, glipiZIDE, ketoconazole, loratadine, losartan, methocarbamol, potassium chloride SA, propranolol, rivaroxaban, and traMADol  History Review  Allergy: [name] Whitaker is allergic to penicillins, amlodipine, erenumab-aooe, and lisinopril. Drug: [name] Whitaker  reports no history of drug use. Alcohol:  reports no history of alcohol use. Tobacco:  reports that he quit smoking about 36 years ago. His smoking use included pipe. He quit after 4.00 years of use. He has never used smokeless tobacco. Social: [name] Whitaker  reports that he quit smoking about 36 years ago. His smoking use included pipe. He quit after 4.00 years of use. He has never used smokeless tobacco. He reports that he does not drink alcohol and does not use drugs. Medical:  has a past medical history of Abnormal ECG (09/27/2013), Chest pain (09/27/2013), Dilated aortic root ([place]) (09/27/2013), DVT, lower extremity ([place]) (10/27/2011), Dyspnea (06/27/2014), Gout, History of DVT (deep vein thrombosis) (04/14/2019), History of pulmonary embolism (04/14/2019), HTN (hypertension) (10/25/2011), Hypercholesteremia, Hypertension, Migraine, OSA (obstructive sleep apnea), and Pulmonary embolism ([place]). Surgical: Mr. Kisner  has a past surgical history that includes Appendectomy; Nasal septum surgery; Tonsillectomy; Testicle surgery; Cardiac catheterization; and Back surgery. Family: family history includes Clotting disorder in his brother; Hypertension in his father and mother.  Laboratory Chemistry Profile   Renal Lab Results  Component Value Date   BUN 15 04/15/2019   CREATININE 1.36 (H) 04/15/2019   BCR 11 04/15/2019   GFRAA 62 04/15/2019   GFRNONAA 54 (L) 04/15/2019     Hepatic Lab Results  Component Value Date   AST 20 04/15/2019   ALT 22 09/03/2013   ALBUMIN 4.6 04/15/2019   ALKPHOS 79 04/15/2019   LIPASE 19 03/15/2007     Electrolytes Lab  Results  Component Value Date   NA 145 (H) 04/15/2019   K 4.0 04/15/2019   CL 102 04/15/2019   CALCIUM 9.8 04/15/2019   MG 2.2 04/15/2019     Bone Lab Results  Component Value Date   25OHVITD1 43 04/15/2019   25OHVITD2 <1.0 04/15/2019   25OHVITD3 43 04/15/2019     Inflammation (CRP: Acute Phase) (ESR: Chronic Phase) Lab Results  Component Value Date   CRP 2 04/15/2019   ESRSEDRATE 6 04/15/2019       Note: Above Lab results reviewed.  Recent Imaging Review  DG PAIN CLINIC C-ARM 1-60 MIN NO REPORT Fluoro was used, but no Radiologist interpretation will be provided.  Please refer to "NOTES" tab for provider progress note. Note: Reviewed        Physical Exam  General appearance: Well nourished, well developed, and well hydrated. In no apparent acute distress Mental status: Alert, oriented x 3 (person, place, & time)       Respiratory: No evidence of acute respiratory distress Eyes: PERLA Vitals: BP (!) 119/95 (BP Location: Right Arm, Patient Position: Sitting, Cuff Size: Large)    Pulse 80    Temp 97.7 F (36.5 C) (Temporal)    Resp 16    Ht $R'5\' 11"'NO$  (1.803 m)    Wt 244 lb (110.7 kg)    SpO2 100%    BMI 34.03 kg/m  BMI: Estimated body mass index is 34.03 kg/m as calculated from the following:  Height as of this encounter: $RemoveBeforeD'5\' 11"'wubiCuOfFALXOH$  (1.803 m).   Weight as of this encounter: 244 lb (110.7 kg). Ideal: Ideal body weight: 75.3 kg (166 lb 0.1 oz) Adjusted ideal body weight: 89.5 kg (197 lb 3.3 oz)  Assessment   Status Diagnosis  Controlled Controlled Controlled 1. Chronic pain syndrome   2. Chronic low back pain (1ry area of Pain) (Bilateral) (R>L) w/o sciatica   3. Lumbar facet joint syndrome (Bilateral) (R>L)   4. 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  Plan of Care  Problem-specific:  No problem-specific Assessment & Plan notes found for this encounter.  [name] Whitaker has a current medication list which  includes the following long-term medication(s): allopurinol, famotidine, glipizide, loratadine, losartan, potassium chloride sa, propranolol, rivaroxaban, and chlorthalidone.  Pharmacotherapy (Medications Ordered): No orders of the defined types were placed in this encounter.  Orders:  Orders Placed This Encounter  Procedures   SACROILIAC JOINT INJECTION    Standing Status:   Future    Standing Expiration Date:   02/15/2020    Scheduling Instructions:     Side: Bilateral     Sedation: No Sedation.     Timeframe: 2 weeks    Order Specific Question:   Where will this procedure be performed?    Answer:   [facility]   Follow-up plan:   Return in about 2 weeks (around 01/29/2020) for Procedure (no sedation): (B) SI BLK #2 , (Blood Thinner Protocol).      Interventional treatment options: Planned, scheduled, and/or pending:   At this point, we will be waiting for about 2 weeks to see if his generalized discomfort from the pneumonia and shingles injections go away on their own.  If they do not, then we may bring him back to repeat the bilateral SI joint block and/or right intra-articular hip joint injection and/or lumbar interspinous injection, depending on what is going on at that time.  For the time being, we will hold for 2 weeks so as to make sure that the steroids do not influence the immunity being created by those shots.   Under consideration NOTE: Xarelto Anticoagulation (Stop: 3 days   Restart: 6 hours) Possible right opturator + femoral NB  Possible right opturator + femoral nerve RFA  Possible right SI joint RFA  Diagnostic right lumbar facet block  Possible right lumbar facet RFA (the patient indicates having had approximately 5 prior RFA)   Therapeutic/palliative (PRN):   Palliative right IA hip joint injection #2  (last Whitaker 04/30/2019) Palliative bilateral SI joint block #2     Recent Visits Date Type Provider Dept  12/17/19 Procedure visit Milinda Pointer, MD  [facility]  12/09/19 Office Visit Milinda Pointer, MD [facility]  Showing recent visits within past 90 days and meeting all other requirements Today's Visits Date Type Provider Dept  01/15/20 Office Visit Milinda Pointer, MD [facility]  Showing today's visits and meeting all other requirements Future Appointments Date Type Provider Dept  01/30/20 Appointment Milinda Pointer, MD [facility]  Showing future appointments within next 90 days and meeting all other requirements  I discussed the assessment and treatment plan with the patient. The patient was provided an opportunity to ask questions and all were answered. The patient agreed with the plan and demonstrated an understanding of the instructions.  Patient advised to call back or seek an in-person evaluation if the symptoms or condition worsens.  Duration of encounter: 30 minutes.  Note by: Gaspar Cola, MD Date: 01/15/2020; Time: 10:52 AM

## 2020-01-28 ENCOUNTER — Ambulatory Visit (INDEPENDENT_AMBULATORY_CARE_PROVIDER_SITE_OTHER): Payer: No Typology Code available for payment source | Admitting: Cardiology

## 2020-01-28 ENCOUNTER — Other Ambulatory Visit: Payer: Self-pay

## 2020-01-28 ENCOUNTER — Encounter: Payer: Self-pay | Admitting: Cardiology

## 2020-01-28 VITALS — BP 130/80 | HR 63 | Ht 70.5 in | Wt 250.6 lb

## 2020-01-28 DIAGNOSIS — I77810 Thoracic aortic ectasia: Secondary | ICD-10-CM | POA: Diagnosis not present

## 2020-01-28 DIAGNOSIS — E7800 Pure hypercholesterolemia, unspecified: Secondary | ICD-10-CM | POA: Diagnosis not present

## 2020-01-28 DIAGNOSIS — I10 Essential (primary) hypertension: Secondary | ICD-10-CM | POA: Diagnosis not present

## 2020-01-28 NOTE — Patient Instructions (Signed)
Medication Instructions:  The current medical regimen is effective;  continue present plan and medications.  *If you need a refill on your cardiac medications before your next appointment, please call your pharmacy*  Testing/Procedures: Your physician has requested that you have an echocardiogram. Echocardiography is a painless test that uses sound waves to create images of your heart. It provides your doctor with information about the size and shape of your heart and how well your heart's chambers and valves are working. This procedure takes approximately one hour. There are no restrictions for this procedure.  Follow-Up: At [facility], you and your health needs are our priority.  As part of our continuing mission to provide you with exceptional heart care, we have created designated Provider Care Teams.  These Care Teams include your primary Cardiologist (physician) and Advanced Practice Providers (APPs -  Physician Assistants and Nurse Practitioners) who all work together to provide you with the care you need, when you need it.  We recommend signing up for the patient portal called "MyChart".  Sign up information is provided on this After Visit Summary.  MyChart is used to connect with patients for Virtual Visits (Telemedicine).  Patients are able to view lab/test results, encounter notes, upcoming appointments, etc.  Non-urgent messages can be sent to your provider as well.   To learn more about what you can do with MyChart, go to [email or web address].    Your next appointment:   12 month(s)  The format for your next appointment:   In Person  Provider:   Mark Skains, MD   Thank you for choosing [facility]!!      

## 2020-01-28 NOTE — Progress Notes (Signed)
Cardiology Office Note:    Date:  01/28/2020   ID:  [name] Whitaker, DOB [birth year], MRN [id number]  PCP:  Leighton Ruff, MD  [place] Cardiologist:  No primary care provider on file.  [facility] Electrophysiologist:  None   Referring MD: Leighton Ruff, MD     History of Present Illness:    [name] Whitaker is a 69 y.o. male last seen over 3 years ago here for prior DVT, PE, moderate CAD 50% lesions with no PCI on cardiac catheterization at [facility] in 2014. 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  EKG has shown diffuse T wave inversion.  Past Medical History:  Diagnosis Date  . Abnormal ECG 09/27/2013  . Chest pain 09/27/2013  . Dilated aortic root ([place]) 09/27/2013  . DVT, lower extremity ([place]) 10/27/2011  . Dyspnea 06/27/2014   2/5 /2016  Walked RA x 3 laps @ 185 ft each stopped due to  End of study, slow pace min sob  - PFTs 08/01/14  FEV1  2.89 (90%) ratio 81 and nl dlco    . Gout   . History of DVT (deep vein thrombosis) 04/14/2019  . History of pulmonary embolism 04/14/2019  . HTN (hypertension) 10/25/2011  . Hypercholesteremia   . Hypertension   . Migraine   . OSA (obstructive sleep apnea)    on CPAP   . Pulmonary embolism [facility])     Past Surgical History:  Procedure Laterality Date  . APPENDECTOMY    . BACK SURGERY    . CARDIAC CATHETERIZATION    . NASAL SEPTUM SURGERY    . TESTICLE SURGERY    . TONSILLECTOMY      Current Medications: Current Meds  Medication Sig  . allopurinol (ZYLOPRIM) 300 MG tablet Take 300 mg by mouth daily.  . chlorthalidone (HYGROTON) 50 MG tablet Take 25 mg by mouth daily.  . Cholecalciferol 25 MCG (1000 UT) tablet Take by mouth.  . cyclobenzaprine (FLEXERIL) 10 MG tablet Take 10 mg by mouth as needed.   Marland Kitchen EPINEPHrine 0.3 mg/0.3 mL IJ SOAJ injection Inject 0.3 mg into the muscle as needed.  . famotidine (PEPCID) 20 MG tablet One at bedtime  . finasteride (PROSCAR) 5 MG tablet Take 5 mg by mouth daily.  Marland Kitchen gabapentin  (NEURONTIN) 400 MG capsule Take 800 mg by mouth at bedtime.   . Galcanezumab-gnlm 120 MG/ML SOAJ Inject into the skin every 30 (thirty) days.   Marland Kitchen glipiZIDE (GLUCOTROL) 5 MG tablet Take 5 mg by mouth daily before breakfast.  . ketoconazole (NIZORAL) 2 % cream Apply 1 application topically daily as needed for irritation (feet).   Marland Kitchen loratadine (CLARITIN) 10 MG tablet Take 10 mg by mouth daily.  Marland Kitchen losartan (COZAAR) 100 MG tablet Take 100 mg by mouth daily.  . methocarbamol (ROBAXIN) 500 MG tablet Take 1 tablet (500 mg total) by mouth every 8 (eight) hours as needed for muscle spasms.  . potassium chloride SA (K-DUR,KLOR-CON) 20 MEQ tablet Take 1 tab daily  . propranolol (INDERAL) 60 MG tablet Take 60 mg by mouth 2 (two) times daily.  . rivaroxaban (XARELTO) 20 MG TABS tablet Take 20 mg by mouth every morning.  . traMADol (ULTRAM) 50 MG tablet Take 50 mg by mouth every 6 (six) hours as needed for moderate pain.     Allergies:   Penicillins, Amlodipine, Erenumab-aooe, and Lisinopril   Social History   Socioeconomic History  . Marital status: Single    Spouse name: Not on file  . Number of  children: Not on file  . Years of education: Not on file  . Highest education level: Not on file  Occupational History  . Occupation: Educator   Tobacco Use  . Smoking status: Former Smoker    Years: 4.00    Types: Pipe    Quit date: 05/24/1983    Years since quitting: 36.7  . Smokeless tobacco: Never Used  . Tobacco comment: smoked a pipe for 4 yrs- quit 1985  Substance and Sexual Activity  . Alcohol use: No    Alcohol/week: 0.0 standard drinks  . Drug use: No  . Sexual activity: Not on file  Other Topics Concern  . Not on file  Social History Narrative  . Not on file   Social Determinants of Health   Financial Resource Strain:   . Difficulty of Paying Living Expenses: Not on file  Food Insecurity:   . Worried About Charity fundraiser in the Last Year: Not on file  . Ran Out of Food in the  Last Year: Not on file  Transportation Needs:   . Lack of Transportation (Medical): Not on file  . Lack of Transportation (Non-Medical): Not on file  Physical Activity:   . Days of Exercise per Week: Not on file  . Minutes of Exercise per Session: Not on file  Stress:   . Feeling of Stress : Not on file  Social Connections:   . Frequency of Communication with Friends and Family: Not on file  . Frequency of Social Gatherings with Friends and Family: Not on file  . Attends Religious Services: Not on file  . Active Member of Clubs or Organizations: Not on file  . Attends Archivist Meetings: Not on file  . Marital Status: Not on file     Family History: The patient's family history includes Clotting disorder in his brother; Hypertension in his father and mother.  ROS:   Please see the history of present illness.     All other systems reviewed and are negative.  EKGs/Labs/Other Studies Reviewed:    The following studies were reviewed today:  Cardiac catheterization 07/14/2011-[facility]   -distal left main 10%, mid LAD 50%, circumflex nondominant, minor luminal irregularities. RCA normal. Left ventriculogram mildly depressed.  Right atrium 5, right ventricle 16/2, wedge 2mmHg.  EKG:  EKG is  ordered today.  The ekg ordered today demonstrates sinus bradycardia rate 56 with left anterior fascicular block and diffuse T wave inversions.  Recent Labs: 04/15/2019: BUN 15; Creatinine, Ser 1.36; Magnesium 2.2; Potassium 4.0; Sodium 145  Recent Lipid Panel    Component Value Date/Time   CHOL 97 11/27/2013 0909   CHOL 99 11/18/2011 0356   TRIG 94.0 11/27/2013 0909   TRIG 73 11/18/2011 0356   HDL 28.70 (L) 11/27/2013 0909   HDL 23 (L) 11/18/2011 0356   CHOLHDL 3 11/27/2013 0909   VLDL 18.8 11/27/2013 0909   VLDL 15 11/18/2011 0356   LDLCALC 50 11/27/2013 0909   LDLCALC 61 11/18/2011 0356    Physical Exam:    VS:  BP 130/80   Pulse 63   Ht 5' 10.5" (1.791 m)    Wt 250 lb 9.6 oz (113.7 kg)   SpO2 98%   BMI 35.45 kg/m     Wt Readings from Last 3 Encounters:  01/28/20 250 lb 9.6 oz (113.7 kg)  01/15/20 244 lb (110.7 kg)  12/17/19 (!) 248 lb (112.5 kg)     GEN:  Well nourished, well developed in no  acute distress HEENT: Normal NECK: No JVD; No carotid bruits LYMPHATICS: No lymphadenopathy CARDIAC: RRR, no murmurs, rubs, gallops RESPIRATORY:  Clear to auscultation without rales, wheezing or rhonchi  ABDOMEN: Soft, non-tender, non-distended MUSCULOSKELETAL:  No edema; No deformity  SKIN: Warm and dry NEUROLOGIC:  Alert and oriented x 3 PSYCHIATRIC:  Normal affect   ASSESSMENT:    1. Dilated aortic root (HCC)   2. Essential hypertension   3. Pure hypercholesterolemia    PLAN:    In order of problems listed above:  Chronic chest pain/prior pleurisy -Has had chest pain and back pain intermittently.  Has seen pain specialist.  Reassuring work-ups in the past.  Prior several emergency room visits.  Has been on gabapentin.  Likely MSK/neuropathic etiology.  Overall been doing quite well.  Abnormal EKG -At baseline with T wave inversion.  Cardiac catheterization back in 2014 showed moderate CAD but no PCI.  Treated medically.  Hyperlipidemia -Has trouble with prior statin intolerances.  CAD -As described above.  Secondary risk factor prevention.  Hypercoagulable state -Continue with lifelong Xarelto because of history of recurrent PEs.  Dilated aortic root 4.4-4.6 cm -Continue to monitor.  Check echocardiogram.  Last echo 2016 reviewed-4.4 to 4.6 cm diameter.  Asymptomatic.  Continue to control blood pressure.  Outside labs show creatinine of 1.36 hemoglobin 14.6 potassium 4.0 ALT 13 Outside records reviewed-continued atypical chest pain.  67yr f/u  Medication Adjustments/Labs and Tests Ordered: Current medicines are reviewed at length with the patient today.  Concerns regarding medicines are outlined above.  Orders Placed This  Encounter  Procedures  . EKG 12-Lead  . ECHOCARDIOGRAM COMPLETE   No orders of the defined types were placed in this encounter.   Patient Instructions  Medication Instructions:  The current medical regimen is effective;  continue present plan and medications.  *If you need a refill on your cardiac medications before your next appointment, please call your pharmacy*  Testing/Procedures: Your physician has requested that you have an echocardiogram. Echocardiography is a painless test that uses sound waves to create images of your heart. It provides your doctor with information about the size and shape of your heart and how well your heart's chambers and valves are working. This procedure takes approximately one hour. There are no restrictions for this procedure.  Follow-Up: At [facility], you and your health needs are our priority.  As part of our continuing mission to provide you with exceptional heart care, we have created designated Provider Care Teams.  These Care Teams include your primary Cardiologist (physician) and Advanced Practice Providers (APPs -  Physician Assistants and Nurse Practitioners) who all work together to provide you with the care you need, when you need it.  We recommend signing up for the patient portal called "MyChart".  Sign up information is provided on this After Visit Summary.  MyChart is used to connect with patients for Virtual Visits (Telemedicine).  Patients are able to view lab/test results, encounter notes, upcoming appointments, etc.  Non-urgent messages can be sent to your provider as well.   To learn more about what you can do with MyChart, go to [email or web address].    Your next appointment:   12 month(s)  The format for your next appointment:   In Person  Provider:   Candee Furbish, MD   Thank you for choosing [facility]!!        Signed, Candee Furbish, MD  01/28/2020 11:53 AM    [place]

## 2020-01-30 ENCOUNTER — Ambulatory Visit: Payer: No Typology Code available for payment source | Admitting: Pain Medicine

## 2020-01-31 ENCOUNTER — Ambulatory Visit
Admission: RE | Admit: 2020-01-31 | Discharge: 2020-01-31 | Disposition: A | Discharge status: Home / Self Care | Payer: No Typology Code available for payment source | Source: Ambulatory Visit | Attending: Neurology | Admitting: Neurology

## 2020-01-31 ENCOUNTER — Other Ambulatory Visit: Payer: Self-pay

## 2020-01-31 DIAGNOSIS — G43119 Migraine with aura, intractable, without status migrainosus: Secondary | ICD-10-CM | POA: Insufficient documentation

## 2020-01-31 MED ORDER — GADOBUTROL 1 MMOL/ML IV SOLN
10.0000 mL | Freq: Once | INTRAVENOUS | Status: AC | PRN
  Administered 2020-01-31: 10 mL via INTRAVENOUS

## 2020-02-06 ENCOUNTER — Other Ambulatory Visit: Payer: Self-pay | Admitting: Neurology

## 2020-02-06 ENCOUNTER — Other Ambulatory Visit (HOSPITAL_COMMUNITY): Payer: Self-pay | Admitting: Neurology

## 2020-02-06 DIAGNOSIS — G43119 Migraine with aura, intractable, without status migrainosus: Secondary | ICD-10-CM

## 2020-02-13 ENCOUNTER — Ambulatory Visit (HOSPITAL_COMMUNITY): Payer: Medicare Other | Attending: Internal Medicine

## 2020-02-13 ENCOUNTER — Other Ambulatory Visit: Payer: Self-pay

## 2020-02-13 DIAGNOSIS — I10 Essential (primary) hypertension: Secondary | ICD-10-CM

## 2020-02-13 DIAGNOSIS — I77810 Thoracic aortic ectasia: Secondary | ICD-10-CM | POA: Diagnosis present

## 2020-02-13 LAB — ECHOCARDIOGRAM COMPLETE
Area-P 1/2: 2.24 cm2
S' Lateral: 3.2 cm

## 2020-02-14 ENCOUNTER — Other Ambulatory Visit: Payer: Self-pay | Admitting: *Deleted

## 2020-02-14 DIAGNOSIS — I77810 Thoracic aortic ectasia: Secondary | ICD-10-CM

## 2020-02-14 DIAGNOSIS — I10 Essential (primary) hypertension: Secondary | ICD-10-CM

## 2020-02-25 ENCOUNTER — Ambulatory Visit: Admission: RE | Admit: 2020-02-25 | Payer: No Typology Code available for payment source | Source: Ambulatory Visit

## 2020-03-03 ENCOUNTER — Ambulatory Visit: Payer: No Typology Code available for payment source | Admitting: Pain Medicine

## 2020-03-09 NOTE — Progress Notes (Signed)
PROVIDER NOTE: Information contained herein reflects review and annotations entered in association with encounter. Interpretation of such information and data should be left to medically-trained personnel. Information provided to patient can be located elsewhere in the medical record under "Patient Instructions". Document created using STT-dictation technology, any transcriptional errors that may result from process are unintentional.    Patient: [name] Whitaker  Service Category: Procedure  Provider: Gaspar Cola, MD  DOB: 1951/04/17  DOS: 03/10/2020  Location: [facility]  MRN: [id number]  Setting: Ambulatory - outpatient  Referring Provider: Leighton Ruff, MD  Type: Established Patient  Specialty: Interventional Pain Management  PCP: Leighton Ruff, MD   Primary Reason for Visit: Interventional Pain Management Treatment. CC: Hip Pain (bilateral) and Back Pain (lumbar bilateral )  Procedure:          Anesthesia, Analgesia, Anxiolysis:  Type: Diagnostic Sacroiliac Joint Steroid Injection          Region: Superior Lumbosacral Region Level: PSIS (Posterior Superior Iliac Spine) Laterality: Bilateral  Type: Local Anesthesia Indication(s): Analgesia         Route: Infiltration ([place]/IM) IV Access: Declined Sedation: Declined  Local Anesthetic: Lidocaine 1-2%  Position: Prone           Indications: 1. Chronic sacroiliac joint pain (Bilateral) (R>L)   2. Other spondylosis, sacral and sacrococcygeal region   3. Somatic dysfunction of sacroiliac joints (Bilateral) (R>L)   4. Chronic low back pain (1ry area of Pain) (Bilateral) (R>L) w/o sciatica   5. Chronic anticoagulation (Xarelto)    Pain Score: Pre-procedure: 7 /10 Post-procedure: 3 /10   Pre-op Assessment:  Mr. Coopman is a 69 y.o. (year old), male patient, seen today for interventional treatment. He  has a past surgical history that includes Appendectomy; Nasal septum surgery; Tonsillectomy; Testicle  surgery; Cardiac catheterization; and Back surgery. Mr. Kimmel has a current medication list which includes the following prescription(s): allopurinol, chlorthalidone, cholecalciferol, cyclobenzaprine, epinephrine, famotidine, finasteride, gabapentin, galcanezumab-gnlm, glipizide, ketoconazole, loratadine, losartan, methocarbamol, potassium chloride sa, propranolol, rivaroxaban, and tramadol. His primarily concern today is the Hip Pain (bilateral) and Back Pain (lumbar bilateral )  Initial Vital Signs:  Pulse/HCG Rate: 65  Temp: (!) 97.5 F (36.4 C) Resp: 16 BP: 123/86 SpO2: 99 %  BMI: Estimated body mass index is 35.01 kg/m as calculated from the following:   Height as of this encounter: 5\' 11"  (1.803 m).   Weight as of this encounter: 251 lb (113.9 kg).  Risk Assessment: Allergies: Reviewed. He is allergic to penicillins, amlodipine, erenumab-aooe, and lisinopril.  Allergy Precautions: None required Coagulopathies: Reviewed. None identified.  Blood-thinner therapy: None at this time Active Infection(s): Reviewed. None identified. Mr. Heacock is afebrile  Site Confirmation: Mr. Quintela was asked to confirm the procedure and laterality before marking the site Procedure checklist: Completed Consent: Before the procedure and under the influence of no sedative(s), amnesic(s), or anxiolytics, the patient was informed of the treatment options, risks and possible complications. To fulfill our ethical and legal obligations, as recommended by the American Medical Association's Code of Ethics, I have informed the patient of my clinical impression; the nature and purpose of the treatment or procedure; the risks, benefits, and possible complications of the intervention; the alternatives, including doing nothing; the risk(s) and benefit(s) of the alternative treatment(s) or procedure(s); and the risk(s) and benefit(s) of doing nothing. The patient was provided information about the general risks and  possible complications associated with the procedure. These may include, but are not limited to: failure to  achieve desired goals, infection, bleeding, organ or nerve damage, allergic reactions, paralysis, and death. In addition, the patient was informed of those risks and complications associated to the procedure, such as failure to decrease pain; infection; bleeding; organ or nerve damage with subsequent damage to sensory, motor, and/or autonomic systems, resulting in permanent pain, numbness, and/or weakness of one or several areas of the body; allergic reactions; (i.e.: anaphylactic reaction); and/or death. Furthermore, the patient was informed of those risks and complications associated with the medications. These include, but are not limited to: allergic reactions (i.e.: anaphylactic or anaphylactoid reaction(s)); adrenal axis suppression; blood sugar elevation that in diabetics may result in ketoacidosis or comma; water retention that in patients with history of congestive heart failure may result in shortness of breath, pulmonary edema, and decompensation with resultant heart failure; weight gain; swelling or edema; medication-induced neural toxicity; particulate matter embolism and blood vessel occlusion with resultant organ, and/or nervous system infarction; and/or aseptic necrosis of one or more joints. Finally, the patient was informed that Medicine is not an exact science; therefore, there is also the possibility of unforeseen or unpredictable risks and/or possible complications that may result in a catastrophic outcome. The patient indicated having understood very clearly. We have given the patient no guarantees and we have made no promises. Enough time was given to the patient to ask questions, all of which were answered to the patient's satisfaction. Mr. Payeur has indicated that he wanted to continue with the procedure. Attestation: I, the ordering provider, attest that I have discussed with  the patient the benefits, risks, side-effects, alternatives, likelihood of achieving goals, and potential problems during recovery for the procedure that I have provided informed consent. Date  Time: 03/10/2020  9:06 AM  Pre-Procedure Preparation:  Monitoring: As per clinic protocol. Respiration, ETCO2, SpO2, BP, heart rate and rhythm monitor placed and checked for adequate function Safety Precautions: Patient was assessed for positional comfort and pressure points before starting the procedure. Time-out: I initiated and conducted the "Time-out" before starting the procedure, as per protocol. The patient was asked to participate by confirming the accuracy of the "Time Out" information. Verification of the correct person, site, and procedure were performed and confirmed by me, the nursing staff, and the patient. "Time-out" conducted as per Joint Commission's Universal Protocol (UP.01.01.01). Time: 0931  Description of Procedure:          Target Area: Superior, posterior, aspect of the sacroiliac fissure Approach: Posterior, paraspinal, ipsilateral approach. Area Prepped: Entire Lower Lumbosacral Region DuraPrep (Iodine Povacrylex [0.7% available iodine] and Isopropyl Alcohol, 74% w/w) Safety Precautions: Aspiration looking for blood return was conducted prior to all injections. At no point did we inject any substances, as a needle was being advanced. No attempts were made at seeking any paresthesias. Safe injection practices and needle disposal techniques used. Medications properly checked for expiration dates. SDV (single dose vial) medications used. Description of the Procedure: Protocol guidelines were followed. The patient was placed in position over the procedure table. The target area was identified and the area prepped in the usual manner. Skin & deeper tissues infiltrated with local anesthetic. Appropriate amount of time allowed to pass for local anesthetics to take effect. The procedure  needle was advanced under fluoroscopic guidance into the sacroiliac joint until a firm endpoint was obtained. Proper needle placement secured. Negative aspiration confirmed. Solution injected in intermittent fashion, asking for systemic symptoms every 0.5cc of injectate. The needles were then removed and the area cleansed, making sure to leave  some of the prepping solution back to take advantage of its long term bactericidal properties. Vitals:   03/10/20 0858 03/10/20 0920 03/10/20 0930 03/10/20 0940  BP: 123/86 (!) 130/100 (!) 133/94 (!) 131/95  Pulse: 65 61 65 64  Resp: 16 16 16 15   Temp: (!) 97.5 F (36.4 C)     TempSrc: Temporal     SpO2: 99% 98% 98% 98%  Weight: 251 lb (113.9 kg)     Height: 5\' 11"  (1.803 m)       Start Time: 0932 hrs. End Time: 0937 hrs. Materials:  Needle(s) Type: Spinal Needle Gauge: 22G Length: 3.5-in Medication(s): Please see orders for medications and dosing details.  Imaging Guidance (Non-Spinal):          Type of Imaging Technique: Fluoroscopy Guidance (Non-Spinal) Indication(s): Assistance in needle guidance and placement for procedures requiring needle placement in or near specific anatomical locations not easily accessible without such assistance. Exposure Time: Please see nurses notes. Contrast: Before injecting any contrast, we confirmed that the patient did not have an allergy to iodine, shellfish, or radiological contrast. Once satisfactory needle placement was completed at the desired level, radiological contrast was injected. Contrast injected under live fluoroscopy. No contrast complications. See chart for type and volume of contrast used. Fluoroscopic Guidance: I was personally present during the use of fluoroscopy. "Tunnel Vision Technique" used to obtain the best possible view of the target area. Parallax error corrected before commencing the procedure. "Direction-depth-direction" technique used to introduce the needle under continuous pulsed  fluoroscopy. Once target was reached, antero-posterior, oblique, and lateral fluoroscopic projection used confirm needle placement in all planes. Images permanently stored in EMR. Interpretation: I personally interpreted the imaging intraoperatively. Adequate needle placement confirmed in multiple planes. Appropriate spread of contrast into desired area was observed. No evidence of afferent or efferent intravascular uptake. Permanent images saved into the patient's record.  Antibiotic Prophylaxis:   Anti-infectives (From admission, onward)   None     Indication(s): None identified  Post-operative Assessment:  Post-procedure Vital Signs:  Pulse/HCG Rate: 64 (nsr)  Temp: (!) 97.5 F (36.4 C) Resp: 15 BP: (!) 131/95 SpO2: 98 %  EBL: None  Complications: No immediate post-treatment complications observed by team, or reported by patient.  Note: The patient tolerated the entire procedure well. A repeat set of vitals were taken after the procedure and the patient was kept under observation following institutional policy, for this type of procedure. Post-procedural neurological assessment was performed, showing return to baseline, prior to discharge. The patient was provided with post-procedure discharge instructions, including a section on how to identify potential problems. Should any problems arise concerning this procedure, the patient was given instructions to immediately contact us, at any time, without hesitation. In any case, we plan to contact the patient by telephone for a follow-up status report regarding this interventional procedure.  Comments:  No additional relevant information.  Plan of Care  Orders:  Orders Placed This Encounter  Procedures  . SACROILIAC JOINT INJECTION    Scheduling Instructions:     Side: Bilateral     Sedation: No Sedation.     Timeframe: Today    Order Specific Question:   Where will this procedure be performed?    Answer:   [facility]  .  [facility] C-ARM 1-60 MIN NO REPORT    Intraoperative interpretation by procedural physician at [place].    Standing Status:   Standing    Number of Occurrences:  1    Order Specific Question:   Reason for exam:    Answer:   Assistance in needle guidance and placement for procedures requiring needle placement in or near specific anatomical locations not easily accessible without such assistance.  . Informed Consent Details: Physician/Practitioner Attestation; Transcribe to consent form and obtain patient signature    Provider Attestation: I, [place] Dossie Arbour, MD, (Pain Management Specialist), the physician/practitioner, attest that I have discussed with the patient the benefits, risks, side effects, alternatives, likelihood of achieving goals and potential problems during recovery for the procedure that I have provided informed consent.    Scheduling Instructions:     Nursing Order: Transcribe to consent form and obtain patient signature.     Note: Always confirm laterality of pain with Mr. Don, before procedure.    Order Specific Question:   Physician/Practitioner attestation of informed consent for procedure/surgical case    Answer:   I, the physician/practitioner, attest that I have discussed with the patient the benefits, risks, side effects, alternatives, likelihood of achieving goals and potential problems during recovery for the procedure that I have provided informed consent.    Order Specific Question:   Procedure    Answer:   Sacroiliac Joint Block    Order Specific Question:   Physician/Practitioner performing the procedure    Answer:   Robertlee Rogacki A. Dossie Arbour MD    Order Specific Question:   Indication/Reason    Answer:   Chronic Low Back and Hip Pain secondary to Sacroiliac Joint Pain (Arthralgia/Arthropathy)  . Care order/instruction: Please confirm that the patient has stopped the Xarelto (Rivaroxaban) x 3 days prior to procedure or surgery.    Please  confirm that the patient has stopped the Xarelto (Rivaroxaban) x 3 days prior to procedure or surgery.    Standing Status:   Standing    Number of Occurrences:   1  . Provide equipment / supplies at bedside    "Block Tray" (Disposable  single use) Needle type: SpinalSpinal Amount/quantity: 2 Size: Medium (5-inch) Gauge: 22G    Standing Status:   Standing    Number of Occurrences:   1    Order Specific Question:   Specify    Answer:   Block Tray  . Bleeding precautions    Standing Status:   Standing    Number of Occurrences:   1   Chronic Opioid Analgesic:  No opioid analgesics prescribed by our practice. Highest recorded MME/day: 26.67 mg/day MME/day: 0 mg/day   Medications ordered for procedure: Meds ordered this encounter  Medications  . lidocaine (XYLOCAINE) 2 % (with pres) injection 400 mg  . ropivacaine (PF) 2 mg/mL (0.2%) (NAROPIN) injection 9 mL  . methylPREDNISolone acetate (DEPO-MEDROL) injection 80 mg   Medications administered: We administered lidocaine, ropivacaine (PF) 2 mg/mL (0.2%), and methylPREDNISolone acetate.  See the medical record for exact dosing, route, and time of administration.  Follow-up plan:   Return in about 2 weeks (around 03/24/2020) for (VV), (PP) Follow-up.       Interventional treatment options: Planned, scheduled, and/or pending:   At this point, we will be waiting for about 2 weeks to see if his generalized discomfort from the pneumonia and shingles injections go away on their own.  If they do not, then we may bring him back to repeat the bilateral SI joint block and/or right intra-articular hip joint injection and/or lumbar interspinous injection, depending on what is going on at that time.  For the time being, we will hold for  2 weeks so as to make sure that the steroids do not influence the immunity being created by those shots.   Under consideration NOTE: Xarelto Anticoagulation (Stop: 3 days  Restart: 6 hours) Possible right  opturator + femoral NB  Possible right opturator + femoral nerve RFA  Possible right SI joint RFA  Diagnostic right lumbar facet block  Possible right lumbar facet RFA (the patient indicates having had approximately 5 prior RFA)   Therapeutic/palliative (PRN):   Palliative right IA hip joint injection #2  (last done 04/30/2019) Palliative bilateral SI joint block #2      Recent Visits Date Type Provider Dept  01/15/20 Office Visit Milinda Pointer, MD [facility]  12/17/19 Procedure visit Milinda Pointer, MD [facility]  Showing recent visits within past 90 days and meeting all other requirements Today's Visits Date Type Provider Dept  03/10/20 Procedure visit Milinda Pointer, MD [facility]  Showing today's visits and meeting all other requirements Future Appointments Date Type Provider Dept  03/25/20 Appointment Milinda Pointer, MD [facility]  Showing future appointments within next 90 days and meeting all other requirements  Disposition: Discharge home  Discharge (Date  Time): 03/10/2020; 0942 hrs.   Primary Care Physician: Leighton Ruff, MD Location: [facility] Note by: Gaspar Cola, MD Date: 03/10/2020; Time: 10:24 AM  Disclaimer:  Medicine is not an exact science. The only guarantee in medicine is that nothing is guaranteed. It is important to note that the decision to proceed with this intervention was based on the information collected from the patient. The Data and conclusions were drawn from the patient's questionnaire, the interview, and the physical examination. Because the information was provided in large part by the patient, it cannot be guaranteed that it has not been purposely or unconsciously manipulated. Every effort has been made to obtain as much relevant data as possible for this evaluation. It is important to note that the conclusions that lead to this procedure are  derived in large part from the available data. Always take into account that the treatment will also be dependent on availability of resources and existing treatment guidelines, considered by other Pain Management Practitioners as being common knowledge and practice, at the time of the intervention. For Medico-Legal purposes, it is also important to point out that variation in procedural techniques and pharmacological choices are the acceptable norm. The indications, contraindications, technique, and results of the above procedure should only be interpreted and judged by a Board-Certified Interventional Pain Specialist with extensive familiarity and expertise in the same exact procedure and technique.

## 2020-03-09 NOTE — Patient Instructions (Signed)
____________________________________________________________________________________________  Post-Procedure Discharge Instructions  Instructions:  Apply ice:   Purpose: This will minimize any swelling and discomfort after procedure.   When: Day of procedure, as soon as you get home.  How: Fill a plastic sandwich bag with crushed ice. Cover it with a small towel and apply to injection site.  How long: (15 min on, 15 min off) Apply for 15 minutes then remove x 15 minutes.  Repeat sequence on day of procedure, until you go to bed.  Apply heat:   Purpose: To treat any soreness and discomfort from the procedure.  When: Starting the next day after the procedure.  How: Apply heat to procedure site starting the day following the procedure.  How long: May continue to repeat daily, until discomfort goes away.  Food intake: Start with clear liquids (like water) and advance to regular food, as tolerated.   Physical activities: Keep activities to a minimum for the first 8 hours after the procedure. After that, then as tolerated.  Driving: If you have received any sedation, be responsible and do not drive. You are not allowed to drive for 24 hours after having sedation.  Blood thinner: (Applies only to those taking blood thinners) You may restart your blood thinner 6 hours after your procedure.  Insulin: (Applies only to Diabetic patients taking insulin) As soon as you can eat, you may resume your normal dosing schedule.  Infection prevention: Keep procedure site clean and dry. Shower daily and clean area with soap and water.  Post-procedure Pain Diary: Extremely important that this be done correctly and accurately. Recorded information will be used to determine the next step in treatment. For the purpose of accuracy, follow these rules:  Evaluate only the area treated. Do not report or include pain from an untreated area. For the purpose of this evaluation, ignore all other areas of pain,  except for the treated area.  After your procedure, avoid taking a long nap and attempting to complete the pain diary after you wake up. Instead, set your alarm clock to go off every hour, on the hour, for the initial 8 hours after the procedure. Document the duration of the numbing medicine, and the relief you are getting from it.  Do not go to sleep and attempt to complete it later. It will not be accurate. If you received sedation, it is likely that you were given a medication that may cause amnesia. Because of this, completing the diary at a later time may cause the information to be inaccurate. This information is needed to plan your care.  Follow-up appointment: Keep your post-procedure follow-up evaluation appointment after the procedure (usually 2 weeks for most procedures, 6 weeks for radiofrequencies). DO NOT FORGET to bring you pain diary with you.   Expect: (What should I expect to see with my procedure?)  From numbing medicine (AKA: Local Anesthetics): Numbness or decrease in pain. You may also experience some weakness, which if present, could last for the duration of the local anesthetic.  Onset: Full effect within 15 minutes of injected.  Duration: It will depend on the type of local anesthetic used. On the average, 1 to 8 hours.   From steroids (Applies only if steroids were used): Decrease in swelling or inflammation. Once inflammation is improved, relief of the pain will follow.  Onset of benefits: Depends on the amount of swelling present. The more swelling, the longer it will take for the benefits to be seen. In some cases, up to 10 days.    Duration: Steroids will stay in the system x 2 weeks. Duration of benefits will depend on multiple posibilities including persistent irritating factors.  Side-effects: If present, they may typically last 2 weeks (the duration of the steroids).  Frequent: Cramps (if they occur, drink Gatorade and take over-the-counter Magnesium 450-500 mg  once to twice a day); water retention with temporary weight gain; increases in blood sugar; decreased immune system response; increased appetite.  Occasional: Facial flushing (red, warm cheeks); mood swings; menstrual changes.  Uncommon: Long-term decrease or suppression of natural hormones; bone thinning. (These are more common with higher doses or more frequent use. This is why we prefer that our patients avoid having any injection therapies in other practices.)   Very Rare: Severe mood changes; psychosis; aseptic necrosis.  From procedure: Some discomfort is to be expected once the numbing medicine wears off. This should be minimal if ice and heat are applied as instructed.  Call if: (When should I call?)  You experience numbness and weakness that gets worse with time, as opposed to wearing off.  New onset bowel or bladder incontinence. (Applies only to procedures done in the spine)  Emergency Numbers:  Durning business hours (Monday - Thursday, 8:00 AM - 4:00 PM) (Friday, 9:00 AM - 12:00 Noon): [phone]  After hours: [phone]  NOTE: If you are having a problem and are unable connect with, or to talk to a provider, then go to your nearest urgent care or emergency department. If the problem is serious and urgent, please call 911. ____________________________________________________________________________________________   ____________________________________________________________________________________________  Blood Thinners  IMPORTANT NOTICE:  If you take any of these, make sure to notify the nursing staff.  Failure to do so may result in injury.  Recommended time intervals to stop and restart blood-thinners, before & after invasive procedures  Generic Name Brand Name Stop Time. Must be stopped at least this long before procedures. After procedures, wait at least this long before re-starting.  Abciximab Reopro 15 days 2 hrs  Alteplase Activase 10 days 10 days   Anagrelide Agrylin    Apixaban Eliquis 3 days 6 hrs  Cilostazol Pletal 3 days 5 hrs  Clopidogrel Plavix 7-10 days 2 hrs  Dabigatran Pradaxa 5 days 6 hrs  Dalteparin Fragmin 24 hours 4 hrs  Dipyridamole Aggrenox 11days 2 hrs  Edoxaban Lixiana; Savaysa 3 days 2 hrs  Enoxaparin  Lovenox 24 hours 4 hrs  Eptifibatide Integrillin 8 hours 2 hrs  Fondaparinux  Arixtra 72 hours 12 hrs  Prasugrel Effient 7-10 days 6 hrs  Reteplase Retavase 10 days 10 days  Rivaroxaban Xarelto 3 days 6 hrs  Ticagrelor Brilinta 5-7 days 6 hrs  Ticlopidine Ticlid 10-14 days 2 hrs  Tinzaparin Innohep 24 hours 4 hrs  Tirofiban Aggrastat 8 hours 2 hrs  Warfarin Coumadin 5 days 2 hrs   Other medications with blood-thinning effects  Product indications Generic (Brand) names Note  Cholesterol Lipitor Stop 4 days before procedure  Blood thinner (injectable) Heparin (LMW or LMWH Heparin) Stop 24 hours before procedure  Cancer Ibrutinib (Imbruvica) Stop 7 days before procedure  Malaria/Rheumatoid Hydroxychloroquine (Plaquenil) Stop 11 days before procedure  Thrombolytics  10 days before or after procedures   Over-the-counter (OTC) Products with blood-thinning effects  Product Common names Stop Time  Aspirin > 325 mg Goody Powders, Excedrin, etc. 11 days  Aspirin ? 81 mg  7 days  Fish oil  4 days  Garlic supplements  7 days  Ginkgo biloba  36 hours  Ginseng  24 hours  NSAIDs Ibuprofen, Naprosyn, etc. 3 days  Vitamin E  4 days   ____________________________________________________________________________________________   

## 2020-03-10 ENCOUNTER — Ambulatory Visit (HOSPITAL_BASED_OUTPATIENT_CLINIC_OR_DEPARTMENT_OTHER): Payer: No Typology Code available for payment source | Admitting: Pain Medicine

## 2020-03-10 ENCOUNTER — Other Ambulatory Visit: Payer: Self-pay

## 2020-03-10 ENCOUNTER — Encounter: Payer: Self-pay | Admitting: Pain Medicine

## 2020-03-10 ENCOUNTER — Ambulatory Visit
Admission: RE | Admit: 2020-03-10 | Discharge: 2020-03-10 | Disposition: A | Discharge status: Home / Self Care | Payer: No Typology Code available for payment source | Source: Ambulatory Visit | Attending: Pain Medicine | Admitting: Pain Medicine

## 2020-03-10 VITALS — BP 131/95 | HR 64 | Temp 97.5°F | Resp 15 | Ht 71.0 in | Wt 251.0 lb

## 2020-03-10 DIAGNOSIS — M47898 Other spondylosis, sacral and sacrococcygeal region: Secondary | ICD-10-CM

## 2020-03-10 DIAGNOSIS — M25552 Pain in left hip: Secondary | ICD-10-CM | POA: Insufficient documentation

## 2020-03-10 DIAGNOSIS — M545 Low back pain, unspecified: Secondary | ICD-10-CM

## 2020-03-10 DIAGNOSIS — M533 Sacrococcygeal disorders, not elsewhere classified: Secondary | ICD-10-CM | POA: Diagnosis not present

## 2020-03-10 DIAGNOSIS — G8929 Other chronic pain: Secondary | ICD-10-CM

## 2020-03-10 DIAGNOSIS — M25551 Pain in right hip: Secondary | ICD-10-CM | POA: Diagnosis not present

## 2020-03-10 DIAGNOSIS — Z7901 Long term (current) use of anticoagulants: Secondary | ICD-10-CM

## 2020-03-10 DIAGNOSIS — M9904 Segmental and somatic dysfunction of sacral region: Secondary | ICD-10-CM

## 2020-03-10 MED ORDER — METHYLPREDNISOLONE ACETATE 80 MG/ML IJ SUSP
80.0000 mg | Freq: Once | INTRAMUSCULAR | Status: AC
  Administered 2020-03-10: 80 mg via INTRA_ARTICULAR

## 2020-03-10 MED ORDER — ROPIVACAINE HCL 2 MG/ML IJ SOLN
9.0000 mL | Freq: Once | INTRAMUSCULAR | Status: AC
  Administered 2020-03-10: 9 mL via INTRA_ARTICULAR

## 2020-03-10 MED ORDER — ROPIVACAINE HCL 2 MG/ML IJ SOLN
INTRAMUSCULAR | Status: AC
  Filled 2020-03-10: qty 10

## 2020-03-10 MED ORDER — LIDOCAINE HCL (PF) 2 % IJ SOLN
INTRAMUSCULAR | Status: AC
  Filled 2020-03-10: qty 5

## 2020-03-10 MED ORDER — LIDOCAINE HCL 2 % IJ SOLN
20.0000 mL | Freq: Once | INTRAMUSCULAR | Status: AC
  Administered 2020-03-10: 400 mg

## 2020-03-10 MED ORDER — METHYLPREDNISOLONE ACETATE 80 MG/ML IJ SUSP
INTRAMUSCULAR | Status: AC
  Filled 2020-03-10: qty 1

## 2020-03-11 ENCOUNTER — Telehealth: Payer: Self-pay | Admitting: *Deleted

## 2020-03-11 NOTE — Telephone Encounter (Signed)
Attempted to call for post procedure follow-up. Mailbox full.

## 2020-03-24 NOTE — Progress Notes (Deleted)
Apparently the encounter was not approved by the [place].

## 2020-03-25 ENCOUNTER — Other Ambulatory Visit: Payer: Self-pay

## 2020-03-25 ENCOUNTER — Ambulatory Visit: Payer: Medicare Other | Attending: Pain Medicine | Admitting: Pain Medicine

## 2020-03-25 DIAGNOSIS — G894 Chronic pain syndrome: Secondary | ICD-10-CM

## 2020-03-25 DIAGNOSIS — M545 Low back pain, unspecified: Secondary | ICD-10-CM

## 2020-03-25 DIAGNOSIS — M533 Sacrococcygeal disorders, not elsewhere classified: Secondary | ICD-10-CM

## 2020-04-28 ENCOUNTER — Encounter: Payer: Self-pay | Admitting: Pain Medicine

## 2020-04-28 NOTE — Progress Notes (Signed)
Patient would like to discuss problems he is having with his lower back. He would like to have another RF done if possible. He states his right side is worse than the left.

## 2020-04-29 ENCOUNTER — Ambulatory Visit: Payer: No Typology Code available for payment source | Attending: Pain Medicine | Admitting: Pain Medicine

## 2020-04-29 ENCOUNTER — Other Ambulatory Visit: Payer: Self-pay

## 2020-04-29 DIAGNOSIS — Z7901 Long term (current) use of anticoagulants: Secondary | ICD-10-CM

## 2020-04-29 DIAGNOSIS — M47816 Spondylosis without myelopathy or radiculopathy, lumbar region: Secondary | ICD-10-CM

## 2020-04-29 DIAGNOSIS — M533 Sacrococcygeal disorders, not elsewhere classified: Secondary | ICD-10-CM

## 2020-04-29 DIAGNOSIS — G8929 Other chronic pain: Secondary | ICD-10-CM

## 2020-04-29 DIAGNOSIS — M545 Low back pain, unspecified: Secondary | ICD-10-CM | POA: Diagnosis not present

## 2020-04-29 NOTE — Progress Notes (Signed)
Patient: [name] Whitaker  Service Category: E/M  Provider: Gaspar Cola, MD  DOB: 31-Aug-1950  DOS: 04/29/2020  Location: Office  MRN: [id number]  Setting: Ambulatory outpatient  Referring Provider: Leighton Ruff, MD  Type: Established Patient  Specialty: Interventional Pain Management  PCP: Leighton Ruff, MD  Location: Remote location  Delivery: TeleHealth     Virtual Encounter - Pain Management PROVIDER NOTE: Information contained herein reflects review and annotations entered in association with encounter. Interpretation of such information and data should be left to medically-trained personnel. Information provided to patient can be located elsewhere in the medical record under "Patient Instructions". Document created using STT-dictation technology, any transcriptional errors that may result from process are unintentional.    Contact & Pharmacy Preferred: [phone] Home: [phone] (home) Mobile: [phone] (mobile) E-mail: [email or web address] [place], [place] - [address] [place] [address] Phone: [phone] Fax: [phone]   Pre-screening  [name] Whitaker offered "in-person" vs "virtual" encounter. He indicated preferring virtual for this encounter.   Reason COVID-19*  Social distancing based on CDC and AMA recommendations.   I contacted [name] Whitaker on 04/29/2020 via telephone.      I clearly identified myself as Gaspar Cola, MD. I verified that I was speaking with the correct person using two identifiers (Name: [name] Whitaker, and date of birth: 1950/09/01).  Consent I sought verbal advanced consent from [name] Whitaker for virtual visit interactions. I informed [name] Whitaker of possible security and privacy concerns, risks, and limitations associated with providing "not-in-person" medical evaluation and management services. I also informed [name] Whitaker of the availability of "in-person" appointments.  Finally, I informed him that there would be a charge for the virtual visit and that he could be  personally, fully or partially, financially responsible for it. [name] Whitaker expressed understanding and agreed to proceed.   Historic Elements   [name] Whitaker is a 69 y.o. year old, male patient evaluated today after our last contact on 03/25/2020. [name] Whitaker  has a past medical history of Abnormal ECG (09/27/2013), Chest pain (09/27/2013), Dilated aortic root ([place]) (09/27/2013), DVT, lower extremity ([place]) (10/27/2011), Dyspnea (06/27/2014), Gout, History of DVT (deep vein thrombosis) (04/14/2019), History of pulmonary embolism (04/14/2019), HTN (hypertension) (10/25/2011), Hypercholesteremia, Hypertension, Migraine, OSA (obstructive sleep apnea), and Pulmonary embolism ([place]). He also  has a past surgical history that includes Appendectomy; Nasal septum surgery; Tonsillectomy; Testicle surgery; Cardiac catheterization; and Back surgery. [name] Whitaker has a current medication list which includes the following prescription(s): allopurinol, chlorthalidone, cholecalciferol, cyclobenzaprine, epinephrine, famotidine, finasteride, gabapentin, galcanezumab-gnlm, glipizide, ketoconazole, loratadine, losartan, methocarbamol, potassium chloride sa, propranolol, rivaroxaban, and tramadol. He  reports that he quit smoking about 36 years ago. His smoking use included pipe. He quit after 4.00 years of use. He has never used smokeless tobacco. He reports that he does not drink alcohol and does not use drugs. [name] Whitaker is allergic to penicillins, amlodipine, erenumab-aooe, and lisinopril.   HPI  Today, he is being contacted for medication management.  The patient indicates that immediately after the procedure he did not get complete relief of the pain.  He refers that while the area was numb he had pain about 50% relief of his low back pain.  This later progressed to 100% relief that lasted for approximately 3 to 5 weeks.  He  indicated that he had such good relief that he was seriously considering radiofrequency ablation however, today I have warned him about this since he did not get 100%  relief while the local anesthetic was in place and this means that although the SI joint may be contributing to his pain he may be only 50%.  Today I instructed the patient on how to do some provocative maneuvers at home and report to me the results.  I had him stand up and do provocative hyperextension and rotation of the lumbar spine which yielded positive facet arthralgia ipsilateral to the side that he was turning.  During prior visits we had suspected the possibility that he may be having problems with the lumbar facets as well as the sacroiliac joint.  This to diagnostic bilateral SI joint injections have confirmed that the involvement of the SI joint is approximately 50%, but it is very likely that the other 50% may be the lower lumbar facets.  Today I have recommended to the patient to to a diagnostic bilateral lumbar facet block under fluoroscopic guidance and IV sedation to see if this provides him with complete relief of the pain for the duration of the local anesthetic.  If it does, it would strongly suggest that his pain is coming from those joints.  Since he is taking Xarelto we will have him stop it for approximately 3 days prior to the diagnostic procedure.  The plan was shared with the patient who understood and accepted.  Post-Procedure Evaluation  Procedure (03/10/2020): Diagnostic bilateral sacroiliac joint block #2 under fluoroscopic guidance, no sedation Pre-procedure pain level: 7/10 Post-procedure: 3/10 Limited initial benefit, possibly due to rapid discharge after no sedation procedure, without enough time to allow full onset of block.  Sedation: None.  Effectiveness during initial hour after procedure(Ultra-Short Term Relief):   50%.  Local anesthetic used: Long-acting (4-6 hours) Effectiveness: Defined as any  analgesic benefit obtained secondary to the administration of local anesthetics. This carries significant diagnostic value as to the etiological location, or anatomical origin, of the pain. Duration of benefit is expected to coincide with the duration of the local anesthetic used.  Effectiveness during initial 4-6 hours after procedure(Short-Term Relief):   50%.  Long-term benefit: Defined as any relief past the pharmacologic duration of the local anesthetics.  Effectiveness past the initial 6 hours after procedure(Long-Term Relief):   100% x 3-5 weeks.  Current benefits: Defined as benefit that persist at this time.   Analgesia:  Back to baseline Function: Back to baseline ROM: Back to baseline  Pharmacotherapy Assessment  Analgesic: No opioid analgesics prescribed by our practice. Highest recorded MME/day: 26.67 mg/day MME/day: 0 mg/day   Monitoring: [place] PMP: PDMP not reviewed this encounter.       Pharmacotherapy: No side-effects or adverse reactions reported. Compliance: No problems identified. Effectiveness: Clinically acceptable. Plan: Refer to "POC".  UDS: No results found for: SUMMARY  Laboratory Chemistry Profile   Renal Lab Results  Component Value Date   BUN 15 04/15/2019   CREATININE 1.36 (H) 04/15/2019   BCR 11 04/15/2019   GFRAA 62 04/15/2019   GFRNONAA 54 (L) 04/15/2019     Hepatic Lab Results  Component Value Date   AST 20 04/15/2019   ALT 22 09/03/2013   ALBUMIN 4.6 04/15/2019   ALKPHOS 79 04/15/2019   LIPASE 19 03/15/2007     Electrolytes Lab Results  Component Value Date   NA 145 (H) 04/15/2019   K 4.0 04/15/2019   CL 102 04/15/2019   CALCIUM 9.8 04/15/2019   MG 2.2 04/15/2019     Bone Lab Results  Component Value Date   25OHVITD1 43 04/15/2019  25OHVITD2 <1.0 04/15/2019   25OHVITD3 43 04/15/2019     Inflammation (CRP: Acute Phase) (ESR: Chronic Phase) Lab Results  Component Value Date   CRP 2 04/15/2019   ESRSEDRATE 6 04/15/2019        Note: Above Lab results reviewed.  Imaging  DG PAIN CLINIC C-ARM 1-60 MIN NO REPORT Fluoro was used, but no Radiologist interpretation will be provided.  Please refer to "NOTES" tab for provider progress note.  Assessment  The primary encounter diagnosis was Chronic low back pain (1ry area of Pain) (Bilateral) (R>L) w/o sciatica. Diagnoses of Chronic sacroiliac joint pain (Bilateral) (R>L), Lumbar facet joint syndrome (Bilateral) (R>L), Lumbar facet hypertrophy (Multilevel) , and Chronic anticoagulation (Xarelto) were also pertinent to this visit.  Plan of Care  Problem-specific:  No problem-specific Assessment & Plan notes found for this encounter.  [name] Whitaker has a current medication list which includes the following long-term medication(s): allopurinol, chlorthalidone, famotidine, glipizide, loratadine, losartan, potassium chloride sa, propranolol, and rivaroxaban.  Pharmacotherapy (Medications Ordered): No orders of the defined types were placed in this encounter.  Orders:  Orders Placed This Encounter  Procedures  . LUMBAR FACET(MEDIAL BRANCH NERVE BLOCK) MBNB    Standing Status:   Future    Standing Expiration Date:   05/30/2020    Scheduling Instructions:     Procedure: Lumbar facet block (AKA.: Lumbosacral medial branch nerve block)     Side: Bilateral     Level: L3-4, L4-5, & L5-S1 Facets (L2, L3, L4, L5, & S1 Medial Branch Nerves)     Sedation: Patient's choice.     Timeframe: ASAA    Order Specific Question:   Where will this procedure be performed?    Answer:   [facility]  . Blood Thinner Instructions to Nursing    Always make sure patient has clearance from prescribing physician to stop blood thinners for interventional therapies. If the patient requires a Lovenox-bridge therapy, make sure arrangements are made to institute it with the assistance of the PCP.    Scheduling Instructions:     Have [name] Whitaker stop the Xarelto (Rivaroxaban) x 3  days prior to procedure or surgery.   Follow-up plan:   Return for Procedure (w/ sedation): (B) L-FCT BLK #1, (Blood Thinner Protocol).      Interventional Therapies  Risk  Complexity Considerations:   NOTE: Xarelto Anticoagulation (Stop: 3 days  Restart: 6 hours)   Planned  Pending:   Pending further evaluation   Under consideration:   Possible right opturator + femoral NB  Possible right opturator + femoral nerve RFA  Possible right SI joint RFA  Diagnostic right lumbar facet block  Possible right lumbar facet RFA (the patient indicates having had approximately 5 prior RFA)   Completed:   Therapeutic right IA hip joint injection x2 (100/100/50) Diagnostic bilateral SI joint block x2 (50/50/100 x 5 wks/0)   Palliative options:   Palliative right IA hip joint injection #2  (last done 04/30/2019) Palliative bilateral SI joint block #2     Recent Visits Date Type Provider Dept  03/10/20 Procedure visit Milinda Pointer, MD [facility]  Showing recent visits within past 90 days and meeting all other requirements Today's Visits Date Type Provider Dept  04/29/20 Telemedicine Milinda Pointer, MD [facility]  Showing today's visits and meeting all other requirements Future Appointments No visits were found meeting these conditions. Showing future appointments within next 90 days and meeting all other requirements  I discussed the assessment  and treatment plan with the patient. The patient was provided an opportunity to ask questions and all were answered. The patient agreed with the plan and demonstrated an understanding of the instructions.  Patient advised to call back or seek an in-person evaluation if the symptoms or condition worsens.  Duration of encounter: 18 minutes.  Note by: Gaspar Cola, MD Date: 04/29/2020; Time: 5:46 PM

## 2020-04-29 NOTE — Patient Instructions (Signed)
____________________________________________________________________________________________  Blood Thinners  IMPORTANT NOTICE:  If you take any of these, make sure to notify the nursing staff.  Failure to do so may result in injury.  Recommended time intervals to stop and restart blood-thinners, before & after invasive procedures  Generic Name Brand Name Stop Time. Must be stopped at least this long before procedures. After procedures, wait at least this long before re-starting.  Abciximab Reopro 15 days 2 hrs  Alteplase Activase 10 days 10 days  Anagrelide Agrylin    Apixaban Eliquis 3 days 6 hrs  Cilostazol Pletal 3 days 5 hrs  Clopidogrel Plavix 7-10 days 2 hrs  Dabigatran Pradaxa 5 days 6 hrs  Dalteparin Fragmin 24 hours 4 hrs  Dipyridamole Aggrenox 11days 2 hrs  Edoxaban Lixiana; Savaysa 3 days 2 hrs  Enoxaparin  Lovenox 24 hours 4 hrs  Eptifibatide Integrillin 8 hours 2 hrs  Fondaparinux  Arixtra 72 hours 12 hrs  Prasugrel Effient 7-10 days 6 hrs  Reteplase Retavase 10 days 10 days  Rivaroxaban Xarelto 3 days 6 hrs  Ticagrelor Brilinta 5-7 days 6 hrs  Ticlopidine Ticlid 10-14 days 2 hrs  Tinzaparin Innohep 24 hours 4 hrs  Tirofiban Aggrastat 8 hours 2 hrs  Warfarin Coumadin 5 days 2 hrs   Other medications with blood-thinning effects  Product indications Generic (Brand) names Note  Cholesterol Lipitor Stop 4 days before procedure  Blood thinner (injectable) Heparin (LMW or LMWH Heparin) Stop 24 hours before procedure  Cancer Ibrutinib (Imbruvica) Stop 7 days before procedure  Malaria/Rheumatoid Hydroxychloroquine (Plaquenil) Stop 11 days before procedure  Thrombolytics  10 days before or after procedures   Over-the-counter (OTC) Products with blood-thinning effects  Product Common names Stop Time  Aspirin > 325 mg Goody Powders, Excedrin, etc. 11 days  Aspirin ? 81 mg  7 days  Fish oil  4 days  Garlic supplements  7 days  Ginkgo biloba  36 hours  Ginseng  24  hours  NSAIDs Ibuprofen, Naprosyn, etc. 3 days  Vitamin E  4 days   ____________________________________________________________________________________________  ____________________________________________________________________________________________  Preparing for Procedure with Sedation  Procedure appointments are limited to planned procedures: . No Prescription Refills. . No disability issues will be discussed. . No medication changes will be discussed.  Instructions: . Oral Intake: Do not eat or drink anything for at least 8 hours prior to your procedure. (Exception: Blood Pressure Medication. See below.) . Transportation: Unless otherwise stated by your physician, you may drive yourself after the procedure. . Blood Pressure Medicine: Do not forget to take your blood pressure medicine with a sip of water the morning of the procedure. If your Diastolic (lower reading)is above 100 mmHg, elective cases will be cancelled/rescheduled. . Blood thinners: These will need to be stopped for procedures. Notify our staff if you are taking any blood thinners. Depending on which one you take, there will be specific instructions on how and when to stop it. . Diabetics on insulin: Notify the staff so that you can be scheduled 1st case in the morning. If your diabetes requires high dose insulin, take only  of your normal insulin dose the morning of the procedure and notify the staff that you have done so. . Preventing infections: Shower with an antibacterial soap the morning of your procedure. . Build-up your immune system: Take 1000 mg of Vitamin C with every meal (3 times a day) the day prior to your procedure. Marland Kitchen Antibiotics: Inform the staff if you have a condition or reason that requires  you to take antibiotics before dental procedures. . Pregnancy: If you are pregnant, call and cancel the procedure. . Sickness: If you have a cold, fever, or any active infections, call and cancel the  procedure. . Arrival: You must be in the facility at least 30 minutes prior to your scheduled procedure. . Children: Do not bring children with you. . Dress appropriately: Bring dark clothing that you would not mind if they get stained. . Valuables: Do not bring any jewelry or valuables.  Reasons to call and reschedule or cancel your procedure: (Following these recommendations will minimize the risk of a serious complication.) . Surgeries: Avoid having procedures within 2 weeks of any surgery. (Avoid for 2 weeks before or after any surgery). . Flu Shots: Avoid having procedures within 2 weeks of a flu shots or . (Avoid for 2 weeks before or after immunizations). . Barium: Avoid having a procedure within 7-10 days after having had a radiological study involving the use of radiological contrast. (Myelograms, Barium swallow or enema study). . Heart attacks: Avoid any elective procedures or surgeries for the initial 6 months after a "Myocardial Infarction" (Heart Attack). . Blood thinners: It is imperative that you stop these medications before procedures. Let us know if you if you take any blood thinner.  . Infection: Avoid procedures during or within two weeks of an infection (including chest colds or gastrointestinal problems). Symptoms associated with infections include: Localized redness, fever, chills, night sweats or profuse sweating, burning sensation when voiding, cough, congestion, stuffiness, runny nose, sore throat, diarrhea, nausea, vomiting, cold or Flu symptoms, recent or current infections. It is specially important if the infection is over the area that we intend to treat. Marland Kitchen Heart and lung problems: Symptoms that may suggest an active cardiopulmonary problem include: cough, chest pain, breathing difficulties or shortness of breath, dizziness, ankle swelling, uncontrolled high or unusually low blood pressure, and/or palpitations. If you are experiencing any of these symptoms, cancel your  procedure and contact your primary care physician for an evaluation.  Remember:  Regular Business hours are:  Monday to Thursday 8:00 AM to 4:00 PM  Provider's Schedule: Milinda Pointer, MD:  Procedure days: Tuesday and Thursday 7:30 AM to 4:00 PM  Gillis Santa, MD:  Procedure days: Monday and Wednesday 7:30 AM to 4:00 PM ____________________________________________________________________________________________   ____________________________________________________________________________________________  General Risks and Possible Complications  Patient Responsibilities: It is important that you read this as it is part of your informed consent. It is our duty to inform you of the risks and possible complications associated with treatments offered to you. It is your responsibility as a patient to read this and to ask questions about anything that is not clear or that you believe was not covered in this document.  Patient's Rights: You have the right to refuse treatment. You also have the right to change your mind, even after initially having agreed to have the treatment done. However, under this last option, if you wait until the last second to change your mind, you may be charged for the materials used up to that point.  Introduction: Medicine is not an Chief Strategy Officer. Everything in Medicine, including the lack of treatment(s), carries the potential for danger, harm, or loss (which is by definition: Risk). In Medicine, a complication is a secondary problem, condition, or disease that can aggravate an already existing one. All treatments carry the risk of possible complications. The fact that a side effects or complications occurs, does not imply that the treatment was  conducted incorrectly. It must be clearly understood that these can happen even when everything is done following the highest safety standards.  No treatment: You can choose not to proceed with the proposed treatment  alternative. The "PRO(s)" would include: avoiding the risk of complications associated with the therapy. The "CON(s)" would include: not getting any of the treatment benefits. These benefits fall under one of three categories: diagnostic; therapeutic; and/or palliative. Diagnostic benefits include: getting information which can ultimately lead to improvement of the disease or symptom(s). Therapeutic benefits are those associated with the successful treatment of the disease. Finally, palliative benefits are those related to the decrease of the primary symptoms, without necessarily curing the condition (example: decreasing the pain from a flare-up of a chronic condition, such as incurable terminal cancer).  General Risks and Complications: These are associated to most interventional treatments. They can occur alone, or in combination. They fall under one of the following six (6) categories: no benefit or worsening of symptoms; bleeding; infection; nerve damage; allergic reactions; and/or death. 1. No benefits or worsening of symptoms: In Medicine there are no guarantees, only probabilities. No healthcare provider can ever guarantee that a medical treatment will work, they can only state the probability that it may. Furthermore, there is always the possibility that the condition may worsen, either directly, or indirectly, as a consequence of the treatment. 2. Bleeding: This is more common if the patient is taking a blood thinner, either prescription or over the counter (example: Goody Powders, Fish oil, Aspirin, Garlic, etc.), or if suffering a condition associated with impaired coagulation (example: Hemophilia, cirrhosis of the liver, low platelet counts, etc.). However, even if you do not have one on these, it can still happen. If you have any of these conditions, or take one of these drugs, make sure to notify your treating physician. 3. Infection: This is more common in patients with a compromised immune  system, either due to disease (example: diabetes, cancer, human immunodeficiency virus [HIV], etc.), or due to medications or treatments (example: therapies used to treat cancer and rheumatological diseases). However, even if you do not have one on these, it can still happen. If you have any of these conditions, or take one of these drugs, make sure to notify your treating physician. 4. Nerve Damage: This is more common when the treatment is an invasive one, but it can also happen with the use of medications, such as those used in the treatment of cancer. The damage can occur to small secondary nerves, or to large primary ones, such as those in the spinal cord and brain. This damage may be temporary or permanent and it may lead to impairments that can range from temporary numbness to permanent paralysis and/or brain death. 5. Allergic Reactions: Any time a substance or material comes in contact with our body, there is the possibility of an allergic reaction. These can range from a mild skin rash (contact dermatitis) to a severe systemic reaction (anaphylactic reaction), which can result in death. 6. Death: In general, any medical intervention can result in death, most of the time due to an unforeseen complication. ____________________________________________________________________________________________

## 2020-05-01 NOTE — Progress Notes (Signed)
Unable to contact/ vmail full

## 2020-05-07 ENCOUNTER — Encounter: Payer: Self-pay | Admitting: Pain Medicine

## 2020-05-07 ENCOUNTER — Other Ambulatory Visit: Payer: Self-pay

## 2020-05-07 ENCOUNTER — Ambulatory Visit (HOSPITAL_BASED_OUTPATIENT_CLINIC_OR_DEPARTMENT_OTHER): Payer: No Typology Code available for payment source | Admitting: Pain Medicine

## 2020-05-07 ENCOUNTER — Ambulatory Visit
Admission: RE | Admit: 2020-05-07 | Discharge: 2020-05-07 | Disposition: A | Discharge status: Home / Self Care | Payer: No Typology Code available for payment source | Source: Ambulatory Visit | Attending: Pain Medicine | Admitting: Pain Medicine

## 2020-05-07 VITALS — BP 119/73 | HR 64 | Temp 97.5°F | Resp 16 | Ht 71.5 in | Wt 251.0 lb

## 2020-05-07 DIAGNOSIS — M5137 Other intervertebral disc degeneration, lumbosacral region: Secondary | ICD-10-CM | POA: Insufficient documentation

## 2020-05-07 DIAGNOSIS — M545 Low back pain, unspecified: Secondary | ICD-10-CM | POA: Diagnosis not present

## 2020-05-07 DIAGNOSIS — M47816 Spondylosis without myelopathy or radiculopathy, lumbar region: Secondary | ICD-10-CM

## 2020-05-07 DIAGNOSIS — G894 Chronic pain syndrome: Secondary | ICD-10-CM | POA: Diagnosis not present

## 2020-05-07 DIAGNOSIS — Z7901 Long term (current) use of anticoagulants: Secondary | ICD-10-CM | POA: Insufficient documentation

## 2020-05-07 DIAGNOSIS — G8929 Other chronic pain: Secondary | ICD-10-CM

## 2020-05-07 DIAGNOSIS — M8938 Hypertrophy of bone, other site: Secondary | ICD-10-CM

## 2020-05-07 DIAGNOSIS — M5136 Other intervertebral disc degeneration, lumbar region: Secondary | ICD-10-CM

## 2020-05-07 MED ORDER — FENTANYL CITRATE (PF) 100 MCG/2ML IJ SOLN
25.0000 ug | INTRAMUSCULAR | Status: DC | PRN
  Administered 2020-05-07: 50 ug via INTRAVENOUS
  Filled 2020-05-07: qty 2

## 2020-05-07 MED ORDER — LIDOCAINE HCL 2 % IJ SOLN
20.0000 mL | Freq: Once | INTRAMUSCULAR | Status: AC
  Administered 2020-05-07: 400 mg
  Filled 2020-05-07: qty 40

## 2020-05-07 MED ORDER — LACTATED RINGERS IV SOLN
1000.0000 mL | Freq: Once | INTRAVENOUS | Status: AC
  Administered 2020-05-07: 1000 mL via INTRAVENOUS

## 2020-05-07 MED ORDER — ROPIVACAINE HCL 2 MG/ML IJ SOLN
18.0000 mL | Freq: Once | INTRAMUSCULAR | Status: AC
  Administered 2020-05-07: 18 mL via PERINEURAL
  Filled 2020-05-07: qty 20

## 2020-05-07 MED ORDER — TRIAMCINOLONE ACETONIDE 40 MG/ML IJ SUSP
80.0000 mg | Freq: Once | INTRAMUSCULAR | Status: AC
  Administered 2020-05-07: 80 mg
  Filled 2020-05-07: qty 2

## 2020-05-07 MED ORDER — MIDAZOLAM HCL 5 MG/5ML IJ SOLN
1.0000 mg | INTRAMUSCULAR | Status: DC | PRN
  Administered 2020-05-07: 2 mg via INTRAVENOUS
  Filled 2020-05-07: qty 5

## 2020-05-07 NOTE — Patient Instructions (Signed)
RESTART XARELTO NO SOONER than 6 hours after procedure.  ____________________________________________________________________________________________  Blood Thinners  IMPORTANT NOTICE:  If you take any of these, make sure to notify the nursing staff.  Failure to do so may result in injury.  Recommended time intervals to stop and restart blood-thinners, before & after invasive procedures  Generic Name Brand Name Stop Time. Must be stopped at least this long before procedures. After procedures, wait at least this long before re-starting.  Abciximab Reopro 15 days 2 hrs  Alteplase Activase 10 days 10 days  Anagrelide Agrylin    Apixaban Eliquis 3 days 6 hrs  Cilostazol Pletal 3 days 5 hrs  Clopidogrel Plavix 7-10 days 2 hrs  Dabigatran Pradaxa 5 days 6 hrs  Dalteparin Fragmin 24 hours 4 hrs  Dipyridamole Aggrenox 11days 2 hrs  Edoxaban Lixiana; Savaysa 3 days 2 hrs  Enoxaparin  Lovenox 24 hours 4 hrs  Eptifibatide Integrillin 8 hours 2 hrs  Fondaparinux  Arixtra 72 hours 12 hrs  Prasugrel Effient 7-10 days 6 hrs  Reteplase Retavase 10 days 10 days  Rivaroxaban Xarelto 3 days 6 hrs  Ticagrelor Brilinta 5-7 days 6 hrs  Ticlopidine Ticlid 10-14 days 2 hrs  Tinzaparin Innohep 24 hours 4 hrs  Tirofiban Aggrastat 8 hours 2 hrs  Warfarin Coumadin 5 days 2 hrs   Other medications with blood-thinning effects  Product indications Generic (Brand) names Note  Cholesterol Lipitor Stop 4 days before procedure  Blood thinner (injectable) Heparin (LMW or LMWH Heparin) Stop 24 hours before procedure  Cancer Ibrutinib (Imbruvica) Stop 7 days before procedure  Malaria/Rheumatoid Hydroxychloroquine (Plaquenil) Stop 11 days before procedure  Thrombolytics  10 days before or after procedures   Over-the-counter (OTC) Products with blood-thinning effects  Product Common names Stop Time  Aspirin > 325 mg Goody Powders, Excedrin, etc. 11 days  Aspirin ? 81 mg  7 days  Fish oil  4 days  Garlic  supplements  7 days  Ginkgo biloba  36 hours  Ginseng  24 hours  NSAIDs Ibuprofen, Naprosyn, etc. 3 days  Vitamin E  4 days   ____________________________________________________________________________________________   ____________________________________________________________________________________________  Post-Procedure Discharge Instructions  Instructions:  Apply ice:   Purpose: This will minimize any swelling and discomfort after procedure.   When: Day of procedure, as soon as you get home.  How: Fill a plastic sandwich bag with crushed ice. Cover it with a small towel and apply to injection site.  How long: (15 min on, 15 min off) Apply for 15 minutes then remove x 15 minutes.  Repeat sequence on day of procedure, until you go to bed.  Apply heat:   Purpose: To treat any soreness and discomfort from the procedure.  When: Starting the next day after the procedure.  How: Apply heat to procedure site starting the day following the procedure.  How long: May continue to repeat daily, until discomfort goes away.  Food intake: Start with clear liquids (like water) and advance to regular food, as tolerated.   Physical activities: Keep activities to a minimum for the first 8 hours after the procedure. After that, then as tolerated.  Driving: If you have received any sedation, be responsible and do not drive. You are not allowed to drive for 24 hours after having sedation.  Blood thinner: (Applies only to those taking blood thinners) You may restart your blood thinner 6 hours after your procedure.  Insulin: (Applies only to Diabetic patients taking insulin) As soon as you can eat, you may  resume your normal dosing schedule.  Infection prevention: Keep procedure site clean and dry. Shower daily and clean area with soap and water.  Post-procedure Pain Diary: Extremely important that this be done correctly and accurately. Recorded information will be used to determine the  next step in treatment. For the purpose of accuracy, follow these rules:  Evaluate only the area treated. Do not report or include pain from an untreated area. For the purpose of this evaluation, ignore all other areas of pain, except for the treated area.  After your procedure, avoid taking a long nap and attempting to complete the pain diary after you wake up. Instead, set your alarm clock to go off every hour, on the hour, for the initial 8 hours after the procedure. Document the duration of the numbing medicine, and the relief you are getting from it.  Do not go to sleep and attempt to complete it later. It will not be accurate. If you received sedation, it is likely that you were given a medication that may cause amnesia. Because of this, completing the diary at a later time may cause the information to be inaccurate. This information is needed to plan your care.  Follow-up appointment: Keep your post-procedure follow-up evaluation appointment after the procedure (usually 2 weeks for most procedures, 6 weeks for radiofrequencies). DO NOT FORGET to bring you pain diary with you.   Expect: (What should I expect to see with my procedure?)  From numbing medicine (AKA: Local Anesthetics): Numbness or decrease in pain. You may also experience some weakness, which if present, could last for the duration of the local anesthetic.  Onset: Full effect within 15 minutes of injected.  Duration: It will depend on the type of local anesthetic used. On the average, 1 to 8 hours.   From steroids (Applies only if steroids were used): Decrease in swelling or inflammation. Once inflammation is improved, relief of the pain will follow.  Onset of benefits: Depends on the amount of swelling present. The more swelling, the longer it will take for the benefits to be seen. In some cases, up to 10 days.  Duration: Steroids will stay in the system x 2 weeks. Duration of benefits will depend on multiple posibilities  including persistent irritating factors.  Side-effects: If present, they may typically last 2 weeks (the duration of the steroids).  Frequent: Cramps (if they occur, drink Gatorade and take over-the-counter Magnesium 450-500 mg once to twice a day); water retention with temporary weight gain; increases in blood sugar; decreased immune system response; increased appetite.  Occasional: Facial flushing (red, warm cheeks); mood swings; menstrual changes.  Uncommon: Long-term decrease or suppression of natural hormones; bone thinning. (These are more common with higher doses or more frequent use. This is why we prefer that our patients avoid having any injection therapies in other practices.)   Very Rare: Severe mood changes; psychosis; aseptic necrosis.  From procedure: Some discomfort is to be expected once the numbing medicine wears off. This should be minimal if ice and heat are applied as instructed.  Call if: (When should I call?)  You experience numbness and weakness that gets worse with time, as opposed to wearing off.  New onset bowel or bladder incontinence. (Applies only to procedures done in the spine)  Emergency Numbers:  Durning business hours (Monday - Thursday, 8:00 AM - 4:00 PM) (Friday, 9:00 AM - 12:00 Noon): [phone]  After hours: [phone]  NOTE: If you are having a problem and are  unable connect with, or to talk to a provider, then go to your nearest urgent care or emergency department. If the problem is serious and urgent, please call 911. ____________________________________________________________________________________________

## 2020-05-07 NOTE — Progress Notes (Signed)
PROVIDER NOTE: Information contained herein reflects review and annotations entered in association with encounter. Interpretation of such information and data should be left to medically-trained personnel. Information provided to patient can be located elsewhere in the medical record under "Patient Instructions". Document created using STT-dictation technology, any transcriptional errors that may result from process are unintentional.    Patient: [name] Whitaker  Service Category: Procedure  Provider: Gaspar Cola, MD  DOB: [birth year]  DOS: 05/07/2020  Location: [facility]  MRN: [id number]  Setting: Ambulatory - outpatient  Referring Provider: Leighton Ruff, MD  Type: Established Patient  Specialty: Interventional Pain Management  PCP: Leighton Ruff, MD   Primary Reason for Visit: Interventional Pain Management Treatment. CC: Back Pain (Bilateral lumbar )  Procedure:          Anesthesia, Analgesia, Anxiolysis:  Type: Lumbar Facet, Medial Branch Block(s) #1  Primary Purpose: Diagnostic Region: Posterolateral Lumbosacral Spine Level: L2, L3, L4, L5, & S1 Medial Branch Level(s). Injecting these levels blocks the L3-4, L4-5, and L5-S1 lumbar facet joints. Laterality: Bilateral  Type: Moderate (Conscious) Sedation combined with Local Anesthesia Indication(s): Analgesia and Anxiety Route: Intravenous (IV) IV Access: Secured Sedation: Meaningful verbal contact was maintained at all times during the procedure  Local Anesthetic: Lidocaine 1-2%  Position: Prone   Indications: 1. Lumbar facet joint syndrome (Bilateral) (R>L)   2. Spondylosis of lumbar region without myelopathy or radiculopathy   3. Lumbar facet hypertrophy (Multilevel)    4. DDD (degenerative disc disease), lumbosacral   5. Chronic low back pain (1ry area of Pain) (Bilateral) (R>L) w/o sciatica   6. Chronic anticoagulation (Xarelto)    Pain Score: Pre-procedure: 5 /10 Post-procedure: 0-No pain/10    Pre-op H&P Assessment:  [name] Whitaker is a 69 y.o. (year old), male patient, seen today for interventional treatment. He  has a past surgical history that includes Appendectomy; Nasal septum surgery; Tonsillectomy; Testicle surgery; Cardiac catheterization; and Back surgery. [name] Whitaker has a current medication list which includes the following prescription(s): allopurinol, chlorthalidone, cholecalciferol, cyclobenzaprine, epinephrine, famotidine, finasteride, gabapentin, galcanezumab-gnlm, glipizide, ketoconazole, loratadine, losartan, methocarbamol, potassium chloride sa, propranolol, rivaroxaban, tramadol, and acetaminophen, and the following Facility-Administered Medications: fentanyl and midazolam. His primarily concern today is the Back Pain (Bilateral lumbar )  Initial Vital Signs:  Pulse/HCG Rate: 64ECG Heart Rate: 70 Temp: (!) 97.1 F (36.2 C) Resp: 16 BP: (!) 131/91 SpO2: 97 %  BMI: Estimated body mass index is 34.52 kg/m as calculated from the following:   Height as of this encounter: 5' 11.5" (1.816 m).   Weight as of this encounter: 251 lb (113.9 kg).  Risk Assessment: Allergies: Reviewed. He is allergic to penicillins, amlodipine, amlodipine besylate, atorvastatin, erenumab-aooe, lisinopril, penicillin g, and simvastatin.  Allergy Precautions: None required Coagulopathies: Reviewed. None identified.  Blood-thinner therapy: None at this time Active Infection(s): Reviewed. None identified. [name] Whitaker is afebrile  Site Confirmation: [name] Whitaker was asked to confirm the procedure and laterality before marking the site Procedure checklist: Completed Consent: Before the procedure and under the influence of no sedative(s), amnesic(s), or anxiolytics, the patient was informed of the treatment options, risks and possible complications. To fulfill our ethical and legal obligations, as recommended by the American Medical Association's Code of Ethics, I have informed the patient of  my clinical impression; the nature and purpose of the treatment or procedure; the risks, benefits, and possible complications of the intervention; the alternatives, including doing nothing; the risk(s) and benefit(s) of the alternative treatment(s) or procedure(s); and the  risk(s) and benefit(s) of doing nothing. The patient was provided information about the general risks and possible complications associated with the procedure. These may include, but are not limited to: failure to achieve desired goals, infection, bleeding, organ or nerve damage, allergic reactions, paralysis, and death. In addition, the patient was informed of those risks and complications associated to Spine-related procedures, such as failure to decrease pain; infection (i.e.: Meningitis, epidural or intraspinal abscess); bleeding (i.e.: epidural hematoma, subarachnoid hemorrhage, or any other type of intraspinal or peri-dural bleeding); organ or nerve damage (i.e.: Any type of peripheral nerve, nerve root, or spinal cord injury) with subsequent damage to sensory, motor, and/or autonomic systems, resulting in permanent pain, numbness, and/or weakness of one or several areas of the body; allergic reactions; (i.e.: anaphylactic reaction); and/or death. Furthermore, the patient was informed of those risks and complications associated with the medications. These include, but are not limited to: allergic reactions (i.e.: anaphylactic or anaphylactoid reaction(s)); adrenal axis suppression; blood sugar elevation that in diabetics may result in ketoacidosis or comma; water retention that in patients with history of congestive heart failure may result in shortness of breath, pulmonary edema, and decompensation with resultant heart failure; weight gain; swelling or edema; medication-induced neural toxicity; particulate matter embolism and blood vessel occlusion with resultant organ, and/or nervous system infarction; and/or aseptic necrosis of one or  more joints. Finally, the patient was informed that Medicine is not an exact science; therefore, there is also the possibility of unforeseen or unpredictable risks and/or possible complications that may result in a catastrophic outcome. The patient indicated having understood very clearly. We have given the patient no guarantees and we have made no promises. Enough time was given to the patient to ask questions, all of which were answered to the patient's satisfaction. Mr. Conaway has indicated that he wanted to continue with the procedure. Attestation: I, the ordering provider, attest that I have discussed with the patient the benefits, risks, side-effects, alternatives, likelihood of achieving goals, and potential problems during recovery for the procedure that I have provided informed consent. Date  Time: 05/07/2020 11:10 AM  Pre-Procedure Preparation:  Monitoring: As per clinic protocol. Respiration, ETCO2, SpO2, BP, heart rate and rhythm monitor placed and checked for adequate function Safety Precautions: Patient was assessed for positional comfort and pressure points before starting the procedure. Time-out: I initiated and conducted the "Time-out" before starting the procedure, as per protocol. The patient was asked to participate by confirming the accuracy of the "Time Out" information. Verification of the correct person, site, and procedure were performed and confirmed by me, the nursing staff, and the patient. "Time-out" conducted as per Joint Commission's Universal Protocol (UP.01.01.01). Time: 1141  Description of Procedure:          Laterality: Bilateral. The procedure was performed in identical fashion on both sides. Levels:  L2, L3, L4, L5, & S1 Medial Branch Level(s) Area Prepped: Posterior Lumbosacral Region DuraPrep (Iodine Povacrylex [0.7% available iodine] and Isopropyl Alcohol, 74% w/w) Safety Precautions: Aspiration looking for blood return was conducted prior to all  injections. At no point did we inject any substances, as a needle was being advanced. Before injecting, the patient was told to immediately notify me if he was experiencing any new onset of "ringing in the ears, or metallic taste in the mouth". No attempts were made at seeking any paresthesias. Safe injection practices and needle disposal techniques used. Medications properly checked for expiration dates. SDV (single dose vial) medications used. After the completion of  the procedure, all disposable equipment used was discarded in the proper designated medical waste containers. Local Anesthesia: Protocol guidelines were followed. The patient was positioned over the fluoroscopy table. The area was prepped in the usual manner. The time-out was completed. The target area was identified using fluoroscopy. A 12-in long, straight, sterile hemostat was used with fluoroscopic guidance to locate the targets for each level blocked. Once located, the skin was marked with an approved surgical skin marker. Once all sites were marked, the skin (epidermis, dermis, and hypodermis), as well as deeper tissues (fat, connective tissue and muscle) were infiltrated with a small amount of a short-acting local anesthetic, loaded on a 10cc syringe with a 25G, 1.5-in  Needle. An appropriate amount of time was allowed for local anesthetics to take effect before proceeding to the next step. Local Anesthetic: Lidocaine 2.0% The unused portion of the local anesthetic was discarded in the proper designated containers. Technical explanation of process:  L2 Medial Branch Nerve Block (MBB): The target area for the L2 medial branch is at the junction of the postero-lateral aspect of the superior articular process and the superior, posterior, and medial edge of the transverse process of L3. Under fluoroscopic guidance, a Quincke needle was inserted until contact was made with os over the superior postero-lateral aspect of the pedicular shadow  (target area). After negative aspiration for blood, 0.5 mL of the nerve block solution was injected without difficulty or complication. The needle was removed intact. L3 Medial Branch Nerve Block (MBB): The target area for the L3 medial branch is at the junction of the postero-lateral aspect of the superior articular process and the superior, posterior, and medial edge of the transverse process of L4. Under fluoroscopic guidance, a Quincke needle was inserted until contact was made with os over the superior postero-lateral aspect of the pedicular shadow (target area). After negative aspiration for blood, 0.5 mL of the nerve block solution was injected without difficulty or complication. The needle was removed intact. L4 Medial Branch Nerve Block (MBB): The target area for the L4 medial branch is at the junction of the postero-lateral aspect of the superior articular process and the superior, posterior, and medial edge of the transverse process of L5. Under fluoroscopic guidance, a Quincke needle was inserted until contact was made with os over the superior postero-lateral aspect of the pedicular shadow (target area). After negative aspiration for blood, 0.5 mL of the nerve block solution was injected without difficulty or complication. The needle was removed intact. L5 Medial Branch Nerve Block (MBB): The target area for the L5 medial branch is at the junction of the postero-lateral aspect of the superior articular process and the superior, posterior, and medial edge of the sacral ala. Under fluoroscopic guidance, a Quincke needle was inserted until contact was made with os over the superior postero-lateral aspect of the pedicular shadow (target area). After negative aspiration for blood, 0.5 mL of the nerve block solution was injected without difficulty or complication. The needle was removed intact. S1 Medial Branch Nerve Block (MBB): The target area for the S1 medial branch is at the posterior and inferior 6  o'clock position of the L5-S1 facet joint. Under fluoroscopic guidance, the Quincke needle inserted for the L5 MBB was redirected until contact was made with os over the inferior and postero aspect of the sacrum, at the 6 o' clock position under the L5-S1 facet joint (Target area). After negative aspiration for blood, 0.5 mL of the nerve block solution was injected without  difficulty or complication. The needle was removed intact.  Nerve block solution: 0.2% PF-Ropivacaine + Triamcinolone (40 mg/mL) diluted to a final concentration of 4 mg of Triamcinolone/mL of Ropivacaine The unused portion of the solution was discarded in the proper designated containers. Procedural Needles: 22-gauge, 3.5-inch, Quincke needles used for all levels.  Once the entire procedure was completed, the treated area was cleaned, making sure to leave some of the prepping solution back to take advantage of its long term bactericidal properties.   Illustration of the posterior view of the lumbar spine and the posterior neural structures. Laminae of L2 through S1 are labeled. DPRL5, dorsal primary ramus of L5; DPRS1, dorsal primary ramus of S1; DPR3, dorsal primary ramus of L3; FJ, facet (zygapophyseal) joint L3-L4; I, inferior articular process of L4; LB1, lateral branch of dorsal primary ramus of L1; IAB, inferior articular branches from L3 medial branch (supplies L4-L5 facet joint); IBP, intermediate branch plexus; MB3, medial branch of dorsal primary ramus of L3; NR3, third lumbar nerve root; S, superior articular process of L5; SAB, superior articular branches from L4 (supplies L4-5 facet joint also); TP3, transverse process of L3.  Vitals:   05/07/20 1151 05/07/20 1158 05/07/20 1209 05/07/20 1218  BP: (!) 125/93 122/80 116/81 119/73  Pulse:      Resp: 18 16 16 16   Temp:  (!) 97.5 F (36.4 C)    TempSrc:      SpO2: 100% 95% 95% 96%  Weight:      Height:         Start Time: 1141 hrs. End Time: 1151 hrs.  Imaging  Guidance (Spinal):          Type of Imaging Technique: Fluoroscopy Guidance (Spinal) Indication(s): Assistance in needle guidance and placement for procedures requiring needle placement in or near specific anatomical locations not easily accessible without such assistance. Exposure Time: Please see nurses notes. Contrast: None used. Fluoroscopic Guidance: I was personally present during the use of fluoroscopy. "Tunnel Vision Technique" used to obtain the best possible view of the target area. Parallax error corrected before commencing the procedure. "Direction-depth-direction" technique used to introduce the needle under continuous pulsed fluoroscopy. Once target was reached, antero-posterior, oblique, and lateral fluoroscopic projection used confirm needle placement in all planes. Images permanently stored in EMR. Interpretation: No contrast injected. I personally interpreted the imaging intraoperatively. Adequate needle placement confirmed in multiple planes. Permanent images saved into the patient's record.  Antibiotic Prophylaxis:   Anti-infectives (From admission, onward)   None     Indication(s): None identified  Post-operative Assessment:  Post-procedure Vital Signs:  Pulse/HCG Rate: 6460 Temp: (!) 97.5 F (36.4 C) Resp: 16 BP: 119/73 SpO2: 96 %  EBL: None  Complications: No immediate post-treatment complications observed by team, or reported by patient.  Note: The patient tolerated the entire procedure well. A repeat set of vitals were taken after the procedure and the patient was kept under observation following institutional policy, for this type of procedure. Post-procedural neurological assessment was performed, showing return to baseline, prior to discharge. The patient was provided with post-procedure discharge instructions, including a section on how to identify potential problems. Should any problems arise concerning this procedure, the patient was given instructions to  immediately contact us, at any time, without hesitation. In any case, we plan to contact the patient by telephone for a follow-up status report regarding this interventional procedure.  Comments:  No additional relevant information.  Plan of Care  Orders:  Orders Placed This Encounter  Procedures  . LUMBAR FACET(MEDIAL BRANCH NERVE BLOCK) MBNB    Scheduling Instructions:     Procedure: Lumbar facet block (AKA.: Lumbosacral medial branch nerve block)     Side: Bilateral     Level: L3-4, L4-5, & L5-S1 Facets (L2, L3, L4, L5, & S1 Medial Branch Nerves)     Sedation: Patient's choice.     Timeframe: Today    Order Specific Question:   Where will this procedure be performed?    Answer:   [facility]  . DG [facility] C-ARM 1-60 MIN NO REPORT    Intraoperative interpretation by procedural physician at [facility].    Standing Status:   Standing    Number of Occurrences:   1    Order Specific Question:   Reason for exam:    Answer:   Assistance in needle guidance and placement for procedures requiring needle placement in or near specific anatomical locations not easily accessible without such assistance.  . Informed Consent Details: Physician/Practitioner Attestation; Transcribe to consent form and obtain patient signature    Nursing Order: Transcribe to consent form and obtain patient signature. Note: Always confirm laterality of pain with Mr. Cutrone, before procedure.    Order Specific Question:   Physician/Practitioner attestation of informed consent for procedure/surgical case    Answer:   I, the physician/practitioner, attest that I have discussed with the patient the benefits, risks, side effects, alternatives, likelihood of achieving goals and potential problems during recovery for the procedure that I have provided informed consent.    Order Specific Question:   Procedure    Answer:   Lumbar Facet Block  under fluoroscopic guidance    Order Specific Question:    Physician/Practitioner performing the procedure    Answer:   Edison Nicholson A. Dossie Arbour MD    Order Specific Question:   Indication/Reason    Answer:   Low Back Pain, with our without leg pain, due to Facet Joint Arthralgia (Joint Pain) Spondylosis (Arthritis of the Spine), without myelopathy or radiculopathy (Nerve Damage).  . Care order/instruction: Please confirm that the patient has stopped the Xarelto (Rivaroxaban) x 3 days prior to procedure or surgery.    Please confirm that the patient has stopped the Xarelto (Rivaroxaban) x 3 days prior to procedure or surgery.    Standing Status:   Standing    Number of Occurrences:   1  . Provide equipment / supplies at bedside    "Block Tray" (Disposable  single use) Needle type: SpinalSpinal Amount/quantity: 4 Size: Regular (3.5-inch) Gauge: 22G    Standing Status:   Standing    Number of Occurrences:   1    Order Specific Question:   Specify    Answer:   Block Tray  . Bleeding precautions    Standing Status:   Standing    Number of Occurrences:   1   Chronic Opioid Analgesic:  No opioid analgesics prescribed by our practice. Highest recorded MME/day: 26.67 mg/day MME/day: 0 mg/day   Medications ordered for procedure: Meds ordered this encounter  Medications  . lidocaine (XYLOCAINE) 2 % (with pres) injection 400 mg  . lactated ringers infusion 1,000 mL  . midazolam (VERSED) 5 MG/5ML injection 1-2 mg    Make sure Flumazenil is available in the pyxis when using this medication. If oversedation occurs, administer 0.2 mg IV over 15 sec. If after 45 sec no response, administer 0.2 mg again over 1 min; may repeat at 1 min intervals; not to exceed 4  doses (1 mg)  . fentaNYL (SUBLIMAZE) injection 25-50 mcg    Make sure Narcan is available in the pyxis when using this medication. In the event of respiratory depression (RR< 8/min): Titrate NARCAN (naloxone) in increments of 0.1 to 0.2 mg IV at 2-3 minute intervals, until desired degree of reversal.   . ropivacaine (PF) 2 mg/mL (0.2%) (NAROPIN) injection 18 mL  . triamcinolone acetonide (KENALOG-40) injection 80 mg   Medications administered: We administered lidocaine, lactated ringers, midazolam, fentaNYL, ropivacaine (PF) 2 mg/mL (0.2%), and triamcinolone acetonide.  See the medical record for exact dosing, route, and time of administration.  Follow-up plan:   Return in about 2 weeks (around 05/21/2020) for (F2F), (PP) Follow-up.       Interventional Therapies  Risk  Complexity Considerations:   NOTE: Xarelto Anticoagulation (Stop: 3 days  Restart: 6 hours)   Planned  Pending:   Pending further evaluation   Under consideration:   Possible right opturator + femoral NB  Possible right opturator + femoral nerve RFA  Possible right SI joint RFA  Diagnostic right lumbar facet block  Possible right lumbar facet RFA (the patient indicates having had approximately 5 prior RFA)   Completed:   Therapeutic right IA hip joint injection x2 (100/100/50) Diagnostic bilateral SI joint block x2 (50/50/100 x 5 wks/0)   Palliative options:   Palliative right IA hip joint injection #2  (last done 04/30/2019) Palliative bilateral SI joint block #2     Recent Visits Date Type Provider Dept  04/29/20 Telemedicine Milinda Pointer, MD [facility]  03/10/20 Procedure visit Milinda Pointer, MD [facility]  Showing recent visits within past 90 days and meeting all other requirements Today's Visits Date Type Provider Dept  05/07/20 Procedure visit Milinda Pointer, MD [facility]  Showing today's visits and meeting all other requirements Future Appointments Date Type Provider Dept  05/20/20 Appointment Milinda Pointer, MD [facility]  Showing future appointments within next 90 days and meeting all other requirements  Disposition: Discharge home  Discharge (Date  Time): 05/07/2020; 1219 hrs.   Primary Care Physician: Leighton Ruff,  MD Location: [facility] Note by: Gaspar Cola, MD Date: 05/07/2020; Time: 12:45 PM  Disclaimer:  Medicine is not an Chief Strategy Officer. The only guarantee in medicine is that nothing is guaranteed. It is important to note that the decision to proceed with this intervention was based on the information collected from the patient. The Data and conclusions were drawn from the patient's questionnaire, the interview, and the physical examination. Because the information was provided in large part by the patient, it cannot be guaranteed that it has not been purposely or unconsciously manipulated. Every effort has been made to obtain as much relevant data as possible for this evaluation. It is important to note that the conclusions that lead to this procedure are derived in large part from the available data. Always take into account that the treatment will also be dependent on availability of resources and existing treatment guidelines, considered by other Pain Management Practitioners as being common knowledge and practice, at the time of the intervention. For Medico-Legal purposes, it is also important to point out that variation in procedural techniques and pharmacological choices are the acceptable norm. The indications, contraindications, technique, and results of the above procedure should only be interpreted and judged by a Board-Certified Interventional Pain Specialist with extensive familiarity and expertise in the same exact procedure and technique.

## 2020-05-08 ENCOUNTER — Telehealth: Payer: Self-pay

## 2020-05-08 NOTE — Telephone Encounter (Signed)
Post procedure phone call.  Patient states he is doing ok.  

## 2020-05-19 NOTE — Progress Notes (Signed)
PROVIDER NOTE: Information contained herein reflects review and annotations entered in association with encounter. Interpretation of such information and data should be left to medically-trained personnel. Information provided to patient can be located elsewhere in the medical record under "Patient Instructions". Document created using STT-dictation technology, any transcriptional errors that may result from process are unintentional.    Patient: [name] Whitaker  Service Category: E/M  Provider: Oswaldo Done, MD  DOB: December 29, 1950  DOS: 05/20/2020  Specialty: Interventional Pain Management  MRN: [id number]  Setting: Ambulatory outpatient  PCP: [name] Rainier, MD  Type: Established Patient    Referring Provider: Juluis Rainier, MD  Location: Office  Delivery: Face-to-face     HPI  [name], a 69 y.o. year old male, is here today because of his Chronic pain syndrome [G89.4]. [name]'s primary complain today is Hip Pain (bilateral) Last encounter: My last encounter with him was on 05/07/2020. Pertinent problems: 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 Pain Assessment: Severity of Chronic pain is reported as a 6 /10. Location: Hip Right,Left/radiates into both legs to mid thigh in the back. Onset: More than a month ago. Quality: Aching,Constant,Shooting,Throbbing. Timing: Constant. Modifying factor(s): rest. Vitals:  height is 5\' 11"  (1.803 m) and weight is 255 lb (115.7 kg). His temperature is 96.6 F (35.9 C) (abnormal). His blood pressure is 155/97 (abnormal) and his pulse is 64. His respiration is 18 and oxygen saturation is 98%.   Reason for encounter: post-procedure assessment.  The patient indicates having attained 100% relief of the pain that lasted for the duration of the local anesthetic and an additional week after that.  This patient in particular has had problems with his facet joints for many years and he refers that prior to his other pain physician retiring, they had Whitaker approximately 5 radiofrequency ablations.  He refers that the last 1 provided him with 1 year of complete relief of the pain.  He would like to go ahead and have the radiofrequency Whitaker, as soon as possible since he knows the day to help.  Post-Procedure Evaluation  Procedure (05/07/2020): Diagnostic bilateral lumbar facet MBB #1 under fluoroscopic guidance and IV sedation Pre-procedure pain level: 5/10 Post-procedure: 0/10 (100% relief)  Sedation: Sedation provided.  Effectiveness during initial hour after procedure(Ultra-Short Term Relief): 100 %.  Local anesthetic used: Long-acting (4-6 hours) Effectiveness: Defined as any analgesic benefit obtained secondary to the administration of local anesthetics. This carries significant diagnostic value as to the etiological location, or anatomical origin, of the pain. Duration of benefit is expected to coincide with the duration of the local anesthetic used.  Effectiveness during initial 4-6 hours after procedure(Short-Term Relief): 100 %.  Long-term benefit: Defined as any relief past the  pharmacologic duration of the local anesthetics.  Effectiveness past the initial 6 hours after procedure(Long-Term Relief): 100 % (lasting 1 week).  Current benefits: Defined as benefit that persist at this time.   Analgesia:  >75% relief Function: [name] reports improvement in function ROM: [name] reports improvement in ROM   Statement of Medical Necessity:  Mr. Cummingscontinues to experienced debilitating chronic nerve-associated pain from the Lumbosacral Facet Syndrome (Spondylosis without myelopathy or radiculopathy, lumbosacral region [M47.817]).  Duration: This pain has persisted for longer than three months.  Non-surgical care: The patient has either failed to respond, or was unable to tolerate, or simply did not get enough benefit from other more conservative therapies including, but not limited to: 1. Over-the-counter oral analgesic medications (i.e.: ibuprofen, naproxen, etc.) 2. Anti-inflammatory medications 3. Muscle relaxants 4. Membrane stabilizers 5. Opioids 6. Physical therapy (PT), chiropractic manipulation, and/or home exercise program (HEP). 7. Modalities (Heat, ice, etc.)  Invasive therapies: Nerve blocks have failed to provide any significant long-term benefit.  However, he indicates that radiofrequency ablations provide him with up to 1 year of complete pain relief.  Surgical care: Not indicated.  Physical exam: Has been consistent with Lumbosacral Facet Syndrome.  Diagnostic imaging: Lumbosacral Facet Arthrosis.                Diagnostic interventional therapies: [name] has attained greater than 50% reduction in pain from at least two (2) diagnostic medial branch blocks conducted in separate occasions.   For the above listed reason, I believe, as the examining and treating physician, that it is medically necessary to proceed with Non-Pulsed Radiofrequency Ablation for the purpose of attempting to prolong the duration of the benefits seen with  the diagnostic injections.  Pharmacotherapy Assessment   Analgesic: No opioid analgesics prescribed by our practice. Highest recorded MME/day: 26.67 mg/day MME/day: 0 mg/day   Monitoring: [place] PMP: PDMP reviewed during this encounter.       Pharmacotherapy: No side-effects or adverse reactions reported. Compliance: No problems identified. Effectiveness: Clinically acceptable.  Dewayne Shorter, RN  05/20/2020  1:16 PM  Signed Safety precautions to be maintained throughout the outpatient stay will include: orient to surroundings, keep bed in low position, maintain call bell within reach at all times, provide assistance with transfer out of bed and ambulation.    UDS: No results found for: SUMMARY   ROS  Constitutional: Denies any fever or chills Gastrointestinal: No reported hemesis, hematochezia, vomiting, or acute GI distress Musculoskeletal: Denies any acute onset joint swelling, redness, loss of ROM, or weakness Neurological: No reported episodes of acute onset apraxia, aphasia, dysarthria, agnosia, amnesia, paralysis, loss of coordination, or loss of consciousness  Medication Review  Cholecalciferol, EPINEPHrine, Galcanezumab-gnlm, acetaminophen, allopurinol, chlorthalidone, cyclobenzaprine, famotidine, finasteride, gabapentin, glipiZIDE, ketoconazole, loratadine, losartan, methocarbamol, potassium chloride SA, propranolol, rivaroxaban, and traMADol  History Review  Allergy: [name] is allergic to penicillins, amlodipine, amlodipine besylate, atorvastatin, erenumab-aooe, lisinopril, penicillin g, and simvastatin. Drug: [name]  reports no history of drug use. Alcohol:  reports no history of alcohol use. Tobacco:  reports that he quit smoking about 37 years ago. His smoking use included pipe. He quit after 4.00 years of use. He has never used smokeless tobacco. Social: [name]  reports that he quit smoking about 37 years ago. His smoking use included pipe. He quit after  4.00 years of use. He has never used smokeless tobacco. He reports that he does not drink alcohol and does not use drugs. Medical:  has a past medical history of Abnormal ECG (09/27/2013), Chest pain (09/27/2013), Dilated aortic root ([place]) (09/27/2013), DVT, lower extremity ([place]) (10/27/2011), Dyspnea (06/27/2014),  Gout, History of DVT (deep vein thrombosis) (04/14/2019), History of pulmonary embolism (04/14/2019), HTN (hypertension) (10/25/2011), Hypercholesteremia, Hypertension, Migraine, OSA (obstructive sleep apnea), and Pulmonary embolism ([place]). Surgical: [name]  has a past surgical history that includes Appendectomy; Nasal septum surgery; Tonsillectomy; Testicle surgery; Cardiac catheterization; and Back surgery. Family: family history includes Clotting disorder in his brother; Hypertension in his father and mother.  Laboratory Chemistry Profile   Renal Lab Results  Component Value Date   BUN 15 04/15/2019   CREATININE 1.36 (H) 04/15/2019   BCR 11 04/15/2019   GFRAA 62 04/15/2019   GFRNONAA 54 (L) 04/15/2019     Hepatic Lab Results  Component Value Date   AST 20 04/15/2019   ALT 22 09/03/2013   ALBUMIN 4.6 04/15/2019   ALKPHOS 79 04/15/2019   LIPASE 19 03/15/2007     Electrolytes Lab Results  Component Value Date   NA 145 (H) 04/15/2019   K 4.0 04/15/2019   CL 102 04/15/2019   CALCIUM 9.8 04/15/2019   MG 2.2 04/15/2019     Bone Lab Results  Component Value Date   25OHVITD1 43 04/15/2019   25OHVITD2 <1.0 04/15/2019   25OHVITD3 43 04/15/2019     Inflammation (CRP: Acute Phase) (ESR: Chronic Phase) Lab Results  Component Value Date   CRP 2 04/15/2019   ESRSEDRATE 6 04/15/2019       Note: Above Lab results reviewed.  Recent Imaging Review  DG PAIN CLINIC C-ARM 1-60 MIN NO REPORT Fluoro was used, but no Radiologist interpretation will be provided.  Please refer to "NOTES" tab for provider progress note. Note: Reviewed        Physical Exam  General appearance:  Well nourished, well developed, and well hydrated. In no apparent acute distress Mental status: Alert, oriented x 3 (person, place, & time)       Respiratory: No evidence of acute respiratory distress Eyes: PERLA Vitals: BP (!) 155/97   Pulse 64   Temp (!) 96.6 F (35.9 C)   Resp 18   Ht $R'5\' 11"'yZ$  (1.803 m)   Wt 255 lb (115.7 kg)   SpO2 98%   BMI 35.57 kg/m  BMI: Estimated body mass index is 35.57 kg/m as calculated from the following:   Height as of this encounter: $RemoveBeforeD'5\' 11"'kRUexmSfZkKUHW$  (1.803 m).   Weight as of this encounter: 255 lb (115.7 kg). Ideal: Ideal body weight: 75.3 kg (166 lb 0.1 oz) Adjusted ideal body weight: 91.4 kg (201 lb 9.7 oz)  Assessment   Status Diagnosis  Stable Reoccurring Stable 1. Chronic pain syndrome   2. Lumbar facet joint syndrome (Bilateral) (R>L)   3. Spondylosis of lumbar region without myelopathy or radiculopathy   4. Lumbar facet hypertrophy (Multilevel)    5. Chronic low back pain (1ry area of Pain) (Bilateral) (R>L) w/o sciatica   6. Chronic anticoagulation (Xarelto)      Updated Problems: No problems updated.  Plan of Care  Problem-specific:  No problem-specific Assessment & Plan notes found for this encounter.  [name] has a current medication list which includes the following long-term medication(s): allopurinol, chlorthalidone, famotidine, glipizide, loratadine, losartan, potassium chloride sa, propranolol, and rivaroxaban.  Pharmacotherapy (Medications Ordered): No orders of the defined types were placed in this encounter.  Orders:  Orders Placed This Encounter  Procedures  . Radiofrequency,Lumbar    Standing Status:   Future    Standing Expiration Date:   05/20/2021    Scheduling Instructions:     Side(s): Right-sided  Level: L3-4, L4-5, & L5-S1 Facets (L2, L3, L4, L5, & S1 Medial Branch Nerves)     Sedation: Patient's choice.     Scheduling Timeframe: As soon as pre-approved    Order Specific Question:   Where will  this procedure be performed?    Answer:   [facility]  . Blood Thinner Instructions to Nursing    Always make sure patient has clearance from prescribing physician to stop blood thinners for interventional therapies. If the patient requires a Lovenox-bridge therapy, make sure arrangements are made to institute it with the assistance of the PCP.    Scheduling Instructions:     Have [name] stop the Xarelto (Rivaroxaban) x 3 days prior to procedure or surgery.   Follow-up plan:   Return for Procedure (w/ sedation): (R) L-FCT RFA #1, (Blood Thinner Protocol).      Interventional Therapies  Risk  Complexity Considerations:   NOTE: Xarelto Anticoagulation (Stop: 3 days  Restart: 6 hours)   Planned  Pending:   Therapeutic bilateral lumbar facet medial branch RFA, starting with the right side.   Under consideration:   Possible right opturator + femoral NB  Possible right opturator + femoral nerve RFA  Possible right SI joint RFA  Diagnostic right lumbar facet block  Possible right lumbar facet RFA (the patient indicates having had approximately 5 prior RFA)   Completed:   Therapeutic right IA hip joint injection x2 (100/100/50) Diagnostic bilateral SI joint block x2 (50/50/100 x 5 wks/0)   Palliative options:   Palliative right IA hip joint injection #2  (last Whitaker 04/30/2019) Palliative bilateral SI joint block #2      Recent Visits Date Type Provider Dept  05/07/20 Procedure visit Milinda Pointer, MD [facility]  04/29/20 Telemedicine Milinda Pointer, MD [facility]  03/10/20 Procedure visit Milinda Pointer, MD [facility]  Showing recent visits within past 90 days and meeting all other requirements Today's Visits Date Type Provider Dept  05/20/20 Office Visit Milinda Pointer, MD [facility]  Showing today's visits and meeting all other requirements Future Appointments Date Type Provider Dept  05/28/20  Appointment Milinda Pointer, MD [facility]  Showing future appointments within next 90 days and meeting all other requirements  I discussed the assessment and treatment plan with the patient. The patient was provided an opportunity to ask questions and all were answered. The patient agreed with the plan and demonstrated an understanding of the instructions.  Patient advised to call back or seek an in-person evaluation if the symptoms or condition worsens.  Duration of encounter: 30 minutes.  Note by: Gaspar Cola, MD Date: 05/20/2020; Time: 3:17 PM

## 2020-05-20 ENCOUNTER — Encounter: Payer: Self-pay | Admitting: Pain Medicine

## 2020-05-20 ENCOUNTER — Other Ambulatory Visit: Payer: Self-pay

## 2020-05-20 ENCOUNTER — Ambulatory Visit: Payer: No Typology Code available for payment source | Attending: Pain Medicine | Admitting: Pain Medicine

## 2020-05-20 VITALS — BP 155/97 | HR 64 | Temp 96.6°F | Resp 18 | Ht 71.0 in | Wt 255.0 lb

## 2020-05-20 DIAGNOSIS — M545 Low back pain, unspecified: Secondary | ICD-10-CM

## 2020-05-20 DIAGNOSIS — M47816 Spondylosis without myelopathy or radiculopathy, lumbar region: Secondary | ICD-10-CM | POA: Insufficient documentation

## 2020-05-20 DIAGNOSIS — G8929 Other chronic pain: Secondary | ICD-10-CM

## 2020-05-20 DIAGNOSIS — Z7901 Long term (current) use of anticoagulants: Secondary | ICD-10-CM | POA: Diagnosis not present

## 2020-05-20 DIAGNOSIS — G894 Chronic pain syndrome: Secondary | ICD-10-CM | POA: Diagnosis present

## 2020-05-20 NOTE — Progress Notes (Signed)
Safety precautions to be maintained throughout the outpatient stay will include: orient to surroundings, keep bed in low position, maintain call bell within reach at all times, provide assistance with transfer out of bed and ambulation.  

## 2020-05-20 NOTE — Patient Instructions (Addendum)
____Stop Xarelto x 3 days for procedure.  Take blood pressure medication with a small sip of water the morning of the procedure.  ________________________________________________________________________________________  Preparing for Procedure with Sedation  Procedure appointments are limited to planned procedures: . No Prescription Refills. . No disability issues will be discussed. . No medication changes will be discussed.  Instructions: . Oral Intake: Do not eat or drink anything for at least 8 hours prior to your procedure. (Exception: Blood Pressure Medication. See below.) . Transportation: Unless otherwise stated by your physician, you may drive yourself after the procedure. . Blood Pressure Medicine: Do not forget to take your blood pressure medicine with a sip of water the morning of the procedure. If your Diastolic (lower reading)is above 100 mmHg, elective cases will be cancelled/rescheduled. . Blood thinners: These will need to be stopped for procedures. Notify our staff if you are taking any blood thinners. Depending on which one you take, there will be specific instructions on how and when to stop it. . Diabetics on insulin: Notify the staff so that you can be scheduled 1st case in the morning. If your diabetes requires high dose insulin, take only  of your normal insulin dose the morning of the procedure and notify the staff that you have done so. . Preventing infections: Shower with an antibacterial soap the morning of your procedure. . Build-up your immune system: Take 1000 mg of Vitamin C with every meal (3 times a day) the day prior to your procedure. Marland Kitchen Antibiotics: Inform the staff if you have a condition or reason that requires you to take antibiotics before dental procedures. . Pregnancy: If you are pregnant, call and cancel the procedure. . Sickness: If you have a cold, fever, or any active infections, call and cancel the procedure. . Arrival: You must be in the facility at  least 30 minutes prior to your scheduled procedure. . Children: Do not bring children with you. . Dress appropriately: Bring dark clothing that you would not mind if they get stained. . Valuables: Do not bring any jewelry or valuables.  Reasons to call and reschedule or cancel your procedure: (Following these recommendations will minimize the risk of a serious complication.) . Surgeries: Avoid having procedures within 2 weeks of any surgery. (Avoid for 2 weeks before or after any surgery). . Flu Shots: Avoid having procedures within 2 weeks of a flu shots or . (Avoid for 2 weeks before or after immunizations). . Barium: Avoid having a procedure within 7-10 days after having had a radiological study involving the use of radiological contrast. (Myelograms, Barium swallow or enema study). . Heart attacks: Avoid any elective procedures or surgeries for the initial 6 months after a "Myocardial Infarction" (Heart Attack). . Blood thinners: It is imperative that you stop these medications before procedures. Let us know if you if you take any blood thinner.  . Infection: Avoid procedures during or within two weeks of an infection (including chest colds or gastrointestinal problems). Symptoms associated with infections include: Localized redness, fever, chills, night sweats or profuse sweating, burning sensation when voiding, cough, congestion, stuffiness, runny nose, sore throat, diarrhea, nausea, vomiting, cold or Flu symptoms, recent or current infections. It is specially important if the infection is over the area that we intend to treat. Marland Kitchen Heart and lung problems: Symptoms that may suggest an active cardiopulmonary problem include: cough, chest pain, breathing difficulties or shortness of breath, dizziness, ankle swelling, uncontrolled high or unusually low blood pressure, and/or palpitations. If you are  experiencing any of these symptoms, cancel your procedure and contact your primary care physician for an  evaluation.  Remember:  Regular Business hours are:  Monday to Thursday 8:00 AM to 4:00 PM  Provider's Schedule: Milinda Pointer, MD:  Procedure days: Tuesday and Thursday 7:30 AM to 4:00 PM  Gillis Santa, MD:  Procedure days: Monday and Wednesday 7:30 AM to 4:00 PM ____________________________________________________________________________________________   Preparing for Procedure with Sedation Instructions: . Oral Intake: Do not eat or drink anything for at least 8 hours prior to your procedure. . Transportation: Public transportation is not allowed. Bring an adult driver. The driver must be physically present in our waiting room before any procedure can be started. Marland Kitchen Physical Assistance: Bring an adult capable of physically assisting you, in the event you need help. . Blood Pressure Medicine: Take your blood pressure medicine with a sip of water the morning of the procedure. . Insulin: Take only  of your normal insulin dose. . Preventing infections: Shower with an antibacterial soap the morning of your procedure. . Build-up your immune system: Take 1000 mg of Vitamin C with every meal (3 times a day) the day prior to your procedure. . Pregnancy: If you are pregnant, call and cancel the procedure. . Sickness: If you have a cold, fever, or any active infections, call and cancel the procedure. . Arrival: You must be in the facility at least 30 minutes prior to your scheduled procedure. . Children: Do not bring children with you. . Dress appropriately: Bring dark clothing that you would not mind if they get stained. . Valuables: Do not bring any jewelry or valuables. Procedure appointments are reserved for interventional treatments only. Marland Kitchen No Prescription Refills. . No medication changes will be discussed during procedure appointments. . No disability issues will be discussed. Marland Kitchen

## 2020-05-28 ENCOUNTER — Encounter: Payer: Self-pay | Admitting: Pain Medicine

## 2020-05-28 ENCOUNTER — Other Ambulatory Visit: Payer: Self-pay

## 2020-05-28 ENCOUNTER — Ambulatory Visit (HOSPITAL_BASED_OUTPATIENT_CLINIC_OR_DEPARTMENT_OTHER): Payer: No Typology Code available for payment source | Admitting: Pain Medicine

## 2020-05-28 ENCOUNTER — Ambulatory Visit
Admission: RE | Admit: 2020-05-28 | Discharge: 2020-05-28 | Disposition: A | Discharge status: Home / Self Care | Payer: No Typology Code available for payment source | Source: Ambulatory Visit | Attending: Pain Medicine | Admitting: Pain Medicine

## 2020-05-28 VITALS — BP 132/98 | HR 65 | Temp 97.0°F | Resp 18 | Ht 71.0 in | Wt 254.0 lb

## 2020-05-28 DIAGNOSIS — M545 Low back pain, unspecified: Secondary | ICD-10-CM | POA: Diagnosis not present

## 2020-05-28 DIAGNOSIS — G8929 Other chronic pain: Secondary | ICD-10-CM

## 2020-05-28 DIAGNOSIS — M47816 Spondylosis without myelopathy or radiculopathy, lumbar region: Secondary | ICD-10-CM

## 2020-05-28 DIAGNOSIS — G8918 Other acute postprocedural pain: Secondary | ICD-10-CM | POA: Insufficient documentation

## 2020-05-28 DIAGNOSIS — Z7901 Long term (current) use of anticoagulants: Secondary | ICD-10-CM | POA: Insufficient documentation

## 2020-05-28 DIAGNOSIS — J302 Other seasonal allergic rhinitis: Secondary | ICD-10-CM | POA: Insufficient documentation

## 2020-05-28 DIAGNOSIS — E1169 Type 2 diabetes mellitus with other specified complication: Secondary | ICD-10-CM | POA: Insufficient documentation

## 2020-05-28 DIAGNOSIS — M5137 Other intervertebral disc degeneration, lumbosacral region: Secondary | ICD-10-CM | POA: Insufficient documentation

## 2020-05-28 DIAGNOSIS — E7800 Pure hypercholesterolemia, unspecified: Secondary | ICD-10-CM | POA: Insufficient documentation

## 2020-05-28 DIAGNOSIS — N183 Chronic kidney disease, stage 3 unspecified: Secondary | ICD-10-CM | POA: Insufficient documentation

## 2020-05-28 DIAGNOSIS — E119 Type 2 diabetes mellitus without complications: Secondary | ICD-10-CM | POA: Insufficient documentation

## 2020-05-28 DIAGNOSIS — I10 Essential (primary) hypertension: Secondary | ICD-10-CM | POA: Insufficient documentation

## 2020-05-28 DIAGNOSIS — I2510 Atherosclerotic heart disease of native coronary artery without angina pectoris: Secondary | ICD-10-CM | POA: Insufficient documentation

## 2020-05-28 DIAGNOSIS — G43909 Migraine, unspecified, not intractable, without status migrainosus: Secondary | ICD-10-CM | POA: Insufficient documentation

## 2020-05-28 DIAGNOSIS — Z86711 Personal history of pulmonary embolism: Secondary | ICD-10-CM | POA: Insufficient documentation

## 2020-05-28 MED ORDER — LIDOCAINE HCL 2 % IJ SOLN
20.0000 mL | Freq: Once | INTRAMUSCULAR | Status: AC
  Administered 2020-05-28: 400 mg
  Filled 2020-05-28: qty 20

## 2020-05-28 MED ORDER — TRIAMCINOLONE ACETONIDE 40 MG/ML IJ SUSP
INTRAMUSCULAR | Status: AC
  Filled 2020-05-28: qty 1

## 2020-05-28 MED ORDER — ROPIVACAINE HCL 2 MG/ML IJ SOLN
INTRAMUSCULAR | Status: AC
  Filled 2020-05-28: qty 10

## 2020-05-28 MED ORDER — LIDOCAINE HCL (PF) 2 % IJ SOLN
INTRAMUSCULAR | Status: AC
  Filled 2020-05-28: qty 5

## 2020-05-28 MED ORDER — TRIAMCINOLONE ACETONIDE 40 MG/ML IJ SUSP
40.0000 mg | Freq: Once | INTRAMUSCULAR | Status: AC
  Administered 2020-05-28: 40 mg

## 2020-05-28 MED ORDER — HYDROCODONE-ACETAMINOPHEN 5-325 MG PO TABS: 1.0000 | ORAL_TABLET | Freq: Three times a day (TID) | ORAL | 0 refills | Status: DC | PRN

## 2020-05-28 MED ORDER — ROPIVACAINE HCL 2 MG/ML IJ SOLN
9.0000 mL | Freq: Once | INTRAMUSCULAR | Status: AC
  Administered 2020-05-28: 9 mL via PERINEURAL

## 2020-05-28 NOTE — Patient Instructions (Addendum)
You may go back on your Xarelto no sooner than 6 hours after the procedure. ____________________________________________________________________________________________  Blood Thinners  IMPORTANT NOTICE:  If you take any of these, make sure to notify the nursing staff.  Failure to do so may result in injury.  Recommended time intervals to stop and restart blood-thinners, before & after invasive procedures  Generic Name Brand Name Stop Time. Must be stopped at least this long before procedures. After procedures, wait at least this long before re-starting.  Abciximab Reopro 15 days 2 hrs  Alteplase Activase 10 days 10 days  Anagrelide Agrylin    Apixaban Eliquis 3 days 6 hrs  Cilostazol Pletal 3 days 5 hrs  Clopidogrel Plavix 7-10 days 2 hrs  Dabigatran Pradaxa 5 days 6 hrs  Dalteparin Fragmin 24 hours 4 hrs  Dipyridamole Aggrenox 11days 2 hrs  Edoxaban Lixiana; Savaysa 3 days 2 hrs  Enoxaparin  Lovenox 24 hours 4 hrs  Eptifibatide Integrillin 8 hours 2 hrs  Fondaparinux  Arixtra 72 hours 12 hrs  Prasugrel Effient 7-10 days 6 hrs  Reteplase Retavase 10 days 10 days  Rivaroxaban Xarelto 3 days 6 hrs  Ticagrelor Brilinta 5-7 days 6 hrs  Ticlopidine Ticlid 10-14 days 2 hrs  Tinzaparin Innohep 24 hours 4 hrs  Tirofiban Aggrastat 8 hours 2 hrs  Warfarin Coumadin 5 days 2 hrs   Other medications with blood-thinning effects  Product indications Generic (Brand) names Note  Cholesterol Lipitor Stop 4 days before procedure  Blood thinner (injectable) Heparin (LMW or LMWH Heparin) Stop 24 hours before procedure  Cancer Ibrutinib (Imbruvica) Stop 7 days before procedure  Malaria/Rheumatoid Hydroxychloroquine (Plaquenil) Stop 11 days before procedure  Thrombolytics  10 days before or after procedures   Over-the-counter (OTC) Products with blood-thinning effects  Product Common names Stop Time  Aspirin > 325 mg Goody Powders, Excedrin, etc. 11 days  Aspirin ? 81 mg  7 days  Fish oil  4  days  Garlic supplements  7 days  Ginkgo biloba  36 hours  Ginseng  24 hours  NSAIDs Ibuprofen, Naprosyn, etc. 3 days  Vitamin E  4 days   ____________________________________________________________________________________________  ___________________________________________________________________________________________  Post-Radiofrequency (RF) Discharge Instructions  You have just completed a Radiofrequency Neurotomy.  The following instructions will provide you with information and guidelines for self-care upon discharge.  If at any time you have questions or concerns please call your physician. DO NOT DRIVE YOURSELF!!  Instructions:  Apply ice: Fill a plastic sandwich bag with crushed ice. Cover it with a small towel and apply to injection site. Apply for 15 minutes then remove x 15 minutes. Repeat sequence on day of procedure, until you go to bed. The purpose is to minimize swelling and discomfort after procedure.  Apply heat: Apply heat to procedure site starting the day following the procedure. The purpose is to treat any soreness and discomfort from the procedure.  Food intake: No eating limitations, unless stipulated above.  Nevertheless, if you have had sedation, you may experience some nausea.  In this case, it may be wise to wait at least two hours prior to resuming regular diet.  Physical activities: Keep activities to a minimum for the first 8 hours after the procedure. For the first 24 hours after the procedure, do not drive a motor vehicle,  Operate heavy machinery, power tools, or handle any weapons.  Consider walking with the use of an assistive device or accompanied by an adult for the first 24 hours.  Do not drink alcoholic  beverages including beer.  Do not make any important decisions or sign any legal documents. Go home and rest today.  Resume activities tomorrow, as tolerated.  Use caution in moving about as you may experience mild leg weakness.  Use caution in  cooking, use of household electrical appliances and climbing steps.  Driving: If you have received any sedation, you are not allowed to drive for 24 hours after your procedure.  Blood thinner: Restart your blood thinner 6 hours after your procedure. (Only for those taking blood thinners)  Insulin: As soon as you can eat, you may resume your normal dosing schedule. (Only for those taking insulin)  Medications: May resume pre-procedure medications.  Do not take any drugs, other than what has been prescribed to you.  Infection prevention: Keep procedure site clean and dry.  Post-procedure Pain Diary: Extremely important that this be done correctly and accurately. Recorded information will be used to determine the next step in treatment.  Pain evaluated is that of treated area only. Do not include pain from an untreated area.  Complete every hour, on the hour, for the initial 8 hours. Set an alarm to help you do this part accurately.  Do not go to sleep and have it completed later. It will not be accurate.  Follow-up appointment: Keep your follow-up appointment after the procedure. Usually 2-6 weeks after radiofrequency. Bring you pain diary. The information collected will be essential for your long-term care.   Expect:  From numbing medicine (AKA: Local Anesthetics): Numbness or decrease in pain.  Onset: Full effect within 15 minutes of injected.  Duration: It will depend on the type of local anesthetic used. On the average, 1 to 8 hours.   From steroids (when added): Decrease in swelling or inflammation. Once inflammation is improved, relief of the pain will follow.  Onset of benefits: Depends on the amount of swelling present. The more swelling, the longer it will take for the benefits to be seen. In some cases, up to 10 days.  Duration: Steroids will stay in the system x 2 weeks. Duration of benefits will depend on multiple posibilities including persistent irritating  factors.  From procedure: Some discomfort is to be expected once the numbing medicine wears off. In the case of radiofrequency procedures, this may last as long as 6 weeks. Additional post-procedure pain medication is provided for this. Discomfort is minimized if ice and heat are applied as instructed.  Call if:  You experience numbness and weakness that gets worse with time, as opposed to wearing off.  He experience any unusual bleeding, difficulty breathing, or loss of the ability to control your bowel and bladder. (This applies to Spinal procedures only)  You experience any redness, swelling, heat, red streaks, elevated temperature, fever, or any other signs of a possible infection.  Emergency Numbers:  [place] hours (Monday - Thursday, 8:00 AM - 4:00 PM) (Friday, 9:00 AM - 12:00 Noon): [phone]  After hours: [phone] ____________________________________________________________________________________________    Radiofrequency Lesioning Radiofrequency lesioning is a procedure that is performed to relieve pain. The procedure is often used for back, neck, or arm pain. Radiofrequency lesioning involves the use of a machine that creates radio waves to make heat. During the procedure, the heat is applied to the nerve that carries the pain signal. The heat damages the nerve and interferes with the pain signal. Pain relief usually starts about 2 weeks after the procedure and lasts for 6 months to 1 year. You will be awake  during the procedure. You will need to be able to talk with the health care provider during the procedure. Tell a health care provider about:  Any allergies you have.  All medicines you are taking, including vitamins, herbs, eye drops, creams, and over-the-counter medicines.  Any problems you or family members have had with anesthetic medicines.  Any blood disorders you have.  Any surgeries you have had.  Any medical conditions you have or have  had.  Whether you are pregnant or may be pregnant. What are the risks? Generally, this is a safe procedure. However, problems may occur, including:  Pain or soreness at the injection site.  Allergic reaction to medicines given during the procedure.  Bleeding.  Infection at the injection site.  Damage to nerves or blood vessels. What happens before the procedure? Staying hydrated Follow instructions from your health care provider about hydration, which may include:  Up to 2 hours before the procedure - you may continue to drink clear liquids, such as water, clear fruit juice, black coffee, and plain tea. Eating and drinking Follow instructions from your health care provider about eating and drinking, which may include:  8 hours before the procedure - stop eating heavy meals or foods, such as meat, fried foods, or fatty foods.  6 hours before the procedure - stop eating light meals or foods, such as toast or cereal.  6 hours before the procedure - stop drinking milk or drinks that contain milk.  2 hours before the procedure - stop drinking clear liquids. Medicines Ask your health care provider about:  Changing or stopping your regular medicines. This is especially important if you are taking diabetes medicines or blood thinners.  Taking medicines such as aspirin and ibuprofen. These medicines can thin your blood. Do not take these medicines unless your health care provider tells you to take them.  Taking over-the-counter medicines, vitamins, herbs, and supplements. General instructions  Plan to have someone take you home from the hospital or clinic.  If you will be going home right after the procedure, plan to have someone with you for 24 hours.  Ask your health care provider what steps will be taken to help prevent infection. These may include: ? Removing hair at the procedure site. ? Washing skin with a germ-killing soap. ? Taking antibiotic medicine. What happens  during the procedure?   An IV will be inserted into one of your veins.  You will be given one or more of the following: ? A medicine to help you relax (sedative). ? A medicine to numb the area (local anesthetic).  Your health care provider will insert a radiofrequency needle into the area to be treated. This is done with the help of a type of X-ray (fluoroscopy).  A wire that carries the radio waves (electrode) will be put through the radiofrequency needle.  An electrical pulse will be sent through the electrode to verify the correct nerve that is causing your pain. You will feel a tingling sensation, and you may have muscle twitching.  The tissue around the needle tip will be heated by an electric current that comes from the radiofrequency machine. This will numb the nerves.  The needle will be removed.  A bandage (dressing) will be put on the insertion area. The procedure may vary among health care providers and hospitals. What happens after the procedure?  Your blood pressure, heart rate, breathing rate, and blood oxygen level will be monitored until you leave the hospital or clinic.  Return to your normal activities as told by your health care provider. Ask your health care provider what activities are safe for you.  Do not drive for 24 hours if you were given a sedative during your procedure. Summary  Radiofrequency lesioning is a procedure that is performed to relieve pain. The procedure is often used for back, neck, or arm pain.  Radiofrequency lesioning involves the use of a machine that creates radio waves to make heat.  Plan to have someone take you home from the hospital or clinic. Do not drive for 24 hours if you were given a sedative during your procedure.  Return to your normal activities as told by your health care provider. Ask your health care provider what activities are safe for you. This information is not intended to replace advice given to you by your health  care provider. Make sure you discuss any questions you have with your health care provider. Document Revised: 01/25/2018 Document Reviewed: 01/25/2018 [employer] Patient Education  2020 [employer].

## 2020-05-28 NOTE — Progress Notes (Signed)
PROVIDER NOTE: Information contained herein reflects review and annotations entered in association with encounter. Interpretation of such information and data should be left to medically-trained personnel. Information provided to patient can be located elsewhere in the medical record under "Patient Instructions". Document created using STT-dictation technology, any transcriptional errors that may result from process are unintentional.    Patient: [name] Whitaker  Service Category: Procedure  Provider: Gaspar Cola, MD  DOB: 1950/12/17  DOS: 05/28/2020  Location: [facility]  MRN: [id number]  Setting: Ambulatory - outpatient  Referring Provider: Leighton Ruff, MD  Type: Established Patient  Specialty: Interventional Pain Management  PCP: Leighton Ruff, MD   Primary Reason for Visit: Interventional Pain Management Treatment. CC: Back Pain (Lumbar bilateral left is currently worse )  Procedure:          Anesthesia, Analgesia, Anxiolysis:  Type: Thermal Lumbar Facet, Medial Branch Radiofrequency Ablation/Neurotomy  #1  Primary Purpose: Therapeutic Region: Posterolateral Lumbosacral Spine Level: L2, L3, L4, L5, & S1 Medial Branch Level(s). These levels will denervate the L3-4, L4-5, and the L5-S1 lumbar facet joints. Laterality: Left  Type: Moderate (Conscious) Sedation combined with Local Anesthesia Indication(s): Analgesia and Anxiety Route: Intravenous (IV) IV Access: Secured Sedation: Meaningful verbal contact was maintained at all times during the procedure  Local Anesthetic: Lidocaine 1-2%  Position: Prone   Indications: 1. Lumbar facet joint syndrome (Bilateral) (R>L)   2. Lumbar facet hypertrophy (Multilevel)    3. Spondylosis of lumbar region without myelopathy or radiculopathy   4. DDD (degenerative disc disease), lumbosacral   5. Chronic low back pain (1ry area of Pain) (Bilateral) (R>L) w/o sciatica    [name] Whitaker has been dealing with the above  chronic pain for longer than three months and has either failed to respond, was unable to tolerate, or simply did not get enough benefit from other more conservative therapies including, but not limited to: 1. Over-the-counter medications 2. Anti-inflammatory medications 3. Muscle relaxants 4. Membrane stabilizers 5. Opioids 6. Physical therapy and/or chiropractic manipulation 7. Modalities (Heat, ice, etc.) 8. Invasive techniques such as nerve blocks. [name] Whitaker has attained more than 50% relief of the pain from a series of diagnostic injections conducted in separate occasions.  Pain Score: Pre-procedure: 5 /10 Post-procedure: 0-No pain/10  Today the patient was initially scheduled to come in for a right-sided lumbar facet medial branch radiofrequency ablation under fluoroscopic guidance and IV sedation. However, upon arrival today he indicates that between the time that he was scheduled and now, he had a flareup of the left side and he would like the left side to be done first. We went ahead and checked with his insurance company to see if we could make that change and we were informed the there would be no problem.  Pre-op H&P Assessment:  [name] Whitaker is a 70 y.o. (year old), male patient, seen today for interventional treatment. He  has a past surgical history that includes Appendectomy; Nasal septum surgery; Tonsillectomy; Testicle surgery; Cardiac catheterization; and Back surgery. [name] Whitaker has a current medication list which includes the following prescription(s): acetaminophen, allopurinol, chlorthalidone, cholecalciferol, cyclobenzaprine, epinephrine, famotidine, finasteride, gabapentin, galcanezumab-gnlm, glipizide, hydrocodone-acetaminophen, [START ON 06/04/2020] hydrocodone-acetaminophen, ketoconazole, loratadine, losartan, methocarbamol, potassium chloride sa, propranolol, rivaroxaban, and tramadol. His primarily concern today is the Back Pain (Lumbar bilateral left is currently  worse )  Initial Vital Signs:  Pulse/HCG Rate: 72  Temp: (!) 97 F (36.1 C) Resp: 16 BP: 116/86 SpO2: 100 %  BMI: Estimated body mass index  is 35.43 kg/m as calculated from the following:   Height as of this encounter: 5\' 11"  (1.803 m).   Weight as of this encounter: 254 lb (115.2 kg).  Risk Assessment: Allergies: Reviewed. He is allergic to penicillins, amlodipine, amlodipine besylate, atorvastatin, erenumab-aooe, lisinopril, penicillin g, and simvastatin.  Allergy Precautions: None required Coagulopathies: Reviewed. None identified.  Blood-thinner therapy: None at this time Active Infection(s): Reviewed. None identified. [name] Whitaker is afebrile  Site Confirmation: [name] Whitaker was asked to confirm the procedure and laterality before marking the site Procedure checklist: Completed Consent: Before the procedure and under the influence of no sedative(s), amnesic(s), or anxiolytics, the patient was informed of the treatment options, risks and possible complications. To fulfill our ethical and legal obligations, as recommended by the American Medical Association's Code of Ethics, I have informed the patient of my clinical impression; the nature and purpose of the treatment or procedure; the risks, benefits, and possible complications of the intervention; the alternatives, including doing nothing; the risk(s) and benefit(s) of the alternative treatment(s) or procedure(s); and the risk(s) and benefit(s) of doing nothing. The patient was provided information about the general risks and possible complications associated with the procedure. These may include, but are not limited to: failure to achieve desired goals, infection, bleeding, organ or nerve damage, allergic reactions, paralysis, and death. In addition, the patient was informed of those risks and complications associated to Spine-related procedures, such as failure to decrease pain; infection (i.e.: Meningitis, epidural or intraspinal  abscess); bleeding (i.e.: epidural hematoma, subarachnoid hemorrhage, or any other type of intraspinal or peri-dural bleeding); organ or nerve damage (i.e.: Any type of peripheral nerve, nerve root, or spinal cord injury) with subsequent damage to sensory, motor, and/or autonomic systems, resulting in permanent pain, numbness, and/or weakness of one or several areas of the body; allergic reactions; (i.e.: anaphylactic reaction); and/or death. Furthermore, the patient was informed of those risks and complications associated with the medications. These include, but are not limited to: allergic reactions (i.e.: anaphylactic or anaphylactoid reaction(s)); adrenal axis suppression; blood sugar elevation that in diabetics may result in ketoacidosis or comma; water retention that in patients with history of congestive heart failure may result in shortness of breath, pulmonary edema, and decompensation with resultant heart failure; weight gain; swelling or edema; medication-induced neural toxicity; particulate matter embolism and blood vessel occlusion with resultant organ, and/or nervous system infarction; and/or aseptic necrosis of one or more joints. Finally, the patient was informed that Medicine is not an exact science; therefore, there is also the possibility of unforeseen or unpredictable risks and/or possible complications that may result in a catastrophic outcome. The patient indicated having understood very clearly. We have given the patient no guarantees and we have made no promises. Enough time was given to the patient to ask questions, all of which were answered to the patient's satisfaction. [name] Whitaker has indicated that he wanted to continue with the procedure. Attestation: I, the ordering provider, attest that I have discussed with the patient the benefits, risks, side-effects, alternatives, likelihood of achieving goals, and potential problems during recovery for the procedure that I have provided  informed consent. Date  Time: 05/28/2020 10:07 AM  Pre-Procedure Preparation:  Monitoring: As per clinic protocol. Respiration, ETCO2, SpO2, BP, heart rate and rhythm monitor placed and checked for adequate function Safety Precautions: Patient was assessed for positional comfort and pressure points before starting the procedure. Time-out: I initiated and conducted the "Time-out" before starting the procedure, as per protocol. The patient was  asked to participate by confirming the accuracy of the "Time Out" information. Verification of the correct person, site, and procedure were performed and confirmed by me, the nursing staff, and the patient. "Time-out" conducted as per Joint Commission's Universal Protocol (UP.01.01.01). Time: 1048  Description of Procedure:          Laterality: Left Levels:  L2, L3, L4, L5, & S1 Medial Branch Level(s), at the L3-4, L4-5, and the L5-S1 lumbar facet joints. Area Prepped: Lumbosacral DuraPrep (Iodine Povacrylex [0.7% available iodine] and Isopropyl Alcohol, 74% w/w) Safety Precautions: Aspiration looking for blood return was conducted prior to all injections. At no point did we inject any substances, as a needle was being advanced. Before injecting, the patient was told to immediately notify me if he was experiencing any new onset of "ringing in the ears, or metallic taste in the mouth". No attempts were made at seeking any paresthesias. Safe injection practices and needle disposal techniques used. Medications properly checked for expiration dates. SDV (single dose vial) medications used. After the completion of the procedure, all disposable equipment used was discarded in the proper designated medical waste containers. Local Anesthesia: Protocol guidelines were followed. The patient was positioned over the fluoroscopy table. The area was prepped in the usual manner. The time-out was completed. The target area was identified using fluoroscopy. A 12-in long, straight,  sterile hemostat was used with fluoroscopic guidance to locate the targets for each level blocked. Once located, the skin was marked with an approved surgical skin marker. Once all sites were marked, the skin (epidermis, dermis, and hypodermis), as well as deeper tissues (fat, connective tissue and muscle) were infiltrated with a small amount of a short-acting local anesthetic, loaded on a 10cc syringe with a 25G, 1.5-in  Needle. An appropriate amount of time was allowed for local anesthetics to take effect before proceeding to the next step. Local Anesthetic: Lidocaine 2.0% The unused portion of the local anesthetic was discarded in the proper designated containers. Technical explanation of process:  Radiofrequency Ablation (RFA) L2 Medial Branch Nerve RFA: The target area for the L2 medial branch is at the junction of the postero-lateral aspect of the superior articular process and the superior, posterior, and medial edge of the transverse process of L3. Under fluoroscopic guidance, a Radiofrequency needle was inserted until contact was made with os over the superior postero-lateral aspect of the pedicular shadow (target area). Sensory and motor testing was conducted to properly adjust the position of the needle. Once satisfactory placement of the needle was achieved, the numbing solution was slowly injected after negative aspiration for blood. 2.0 mL of the nerve block solution was injected without difficulty or complication. After waiting for at least 3 minutes, the ablation was performed. Once completed, the needle was removed intact. L3 Medial Branch Nerve RFA: The target area for the L3 medial branch is at the junction of the postero-lateral aspect of the superior articular process and the superior, posterior, and medial edge of the transverse process of L4. Under fluoroscopic guidance, a Radiofrequency needle was inserted until contact was made with os over the superior postero-lateral aspect of the  pedicular shadow (target area). Sensory and motor testing was conducted to properly adjust the position of the needle. Once satisfactory placement of the needle was achieved, the numbing solution was slowly injected after negative aspiration for blood. 2.0 mL of the nerve block solution was injected without difficulty or complication. After waiting for at least 3 minutes, the ablation was performed. Once completed,  the needle was removed intact. L4 Medial Branch Nerve RFA: The target area for the L4 medial branch is at the junction of the postero-lateral aspect of the superior articular process and the superior, posterior, and medial edge of the transverse process of L5. Under fluoroscopic guidance, a Radiofrequency needle was inserted until contact was made with os over the superior postero-lateral aspect of the pedicular shadow (target area). Sensory and motor testing was conducted to properly adjust the position of the needle. Once satisfactory placement of the needle was achieved, the numbing solution was slowly injected after negative aspiration for blood. 2.0 mL of the nerve block solution was injected without difficulty or complication. After waiting for at least 3 minutes, the ablation was performed. Once completed, the needle was removed intact. L5 Medial Branch Nerve RFA: The target area for the L5 medial branch is at the junction of the postero-lateral aspect of the superior articular process of S1 and the superior, posterior, and medial edge of the sacral ala. Under fluoroscopic guidance, a Radiofrequency needle was inserted until contact was made with os over the superior postero-lateral aspect of the pedicular shadow (target area). Sensory and motor testing was conducted to properly adjust the position of the needle. Once satisfactory placement of the needle was achieved, the numbing solution was slowly injected after negative aspiration for blood. 2.0 mL of the nerve block solution was injected  without difficulty or complication. After waiting for at least 3 minutes, the ablation was performed. Once completed, the needle was removed intact. S1 Medial Branch Nerve RFA: The target area for the S1 medial branch is located inferior to the junction of the S1 superior articular process and the L5 inferior articular process, posterior, inferior, and lateral to the 6 o'clock position of the L5-S1 facet joint, just superior to the S1 posterior foramen. Under fluoroscopic guidance, the Radiofrequency needle was advanced until contact was made with os over the Target area. Sensory and motor testing was conducted to properly adjust the position of the needle. Once satisfactory placement of the needle was achieved, the numbing solution was slowly injected after negative aspiration for blood. 2.0 mL of the nerve block solution was injected without difficulty or complication. After waiting for at least 3 minutes, the ablation was performed. Once completed, the needle was removed intact. Radiofrequency lesioning (ablation):  Radiofrequency Generator: NeuroTherm [id number] Sensory Stimulation Parameters: 50 Hz was used to locate & identify the nerve, making sure that the needle was positioned such that there was no sensory stimulation below 0.3 V or above 0.7 V. Motor Stimulation Parameters: 2 Hz was used to evaluate the motor component. Care was taken not to lesion any nerves that demonstrated motor stimulation of the lower extremities at an output of less than 2.5 times that of the sensory threshold, or a maximum of 2.0 V. Lesioning Technique Parameters: Standard Radiofrequency settings. (Not bipolar or pulsed.) Temperature Settings: 80 degrees C Lesioning time: 60 seconds Intra-operative Compliance: Compliant Materials & Medications: Needle(s) (Electrode/Cannula) Type: Teflon-coated, curved tip, Radiofrequency needle(s) Gauge: 20G Length: 15cm Numbing solution: 0.2% PF-Ropivacaine + Triamcinolone (40 mg/mL)  diluted to a final concentration of 4 mg of Triamcinolone/mL of Ropivacaine The unused portion of the solution was discarded in the proper designated containers.  Once the entire procedure was completed, the treated area was cleaned, making sure to leave some of the prepping solution back to take advantage of its long term bactericidal properties.  Illustration of the posterior view of the lumbar spine and the posterior  neural structures. Laminae of L2 through S1 are labeled. DPRL5, dorsal primary ramus of L5; DPRS1, dorsal primary ramus of S1; DPR3, dorsal primary ramus of L3; FJ, facet (zygapophyseal) joint L3-L4; I, inferior articular process of L4; LB1, lateral branch of dorsal primary ramus of L1; IAB, inferior articular branches from L3 medial branch (supplies L4-L5 facet joint); IBP, intermediate branch plexus; MB3, medial branch of dorsal primary ramus of L3; NR3, third lumbar nerve root; S, superior articular process of L5; SAB, superior articular branches from L4 (supplies L4-5 facet joint also); TP3, transverse process of L3.  Vitals:   05/28/20 1105 05/28/20 1115 05/28/20 1120 05/28/20 1128  BP: (!) 131/94 (!) 129/92 131/89 (!) 132/98  Pulse: 62 63 61 65  Resp: 20 (!) 21 20 18   Temp:      TempSrc:      SpO2: 96% 95% 95% 96%  Weight:      Height:       Start Time: 1048 hrs. End Time: 1122 hrs.  Imaging Guidance (Spinal):          Type of Imaging Technique: Fluoroscopy Guidance (Spinal) Indication(s): Assistance in needle guidance and placement for procedures requiring needle placement in or near specific anatomical locations not easily accessible without such assistance. Exposure Time: Please see nurses notes. Contrast: None used. Fluoroscopic Guidance: I was personally present during the use of fluoroscopy. "Tunnel Vision Technique" used to obtain the best possible view of the target area. Parallax error corrected before commencing the procedure. "Direction-depth-direction"  technique used to introduce the needle under continuous pulsed fluoroscopy. Once target was reached, antero-posterior, oblique, and lateral fluoroscopic projection used confirm needle placement in all planes. Images permanently stored in EMR. Interpretation: No contrast injected. I personally interpreted the imaging intraoperatively. Adequate needle placement confirmed in multiple planes. Permanent images saved into the patient's record.  Antibiotic Prophylaxis:   Anti-infectives (From admission, onward)   None     Indication(s): None identified  Post-operative Assessment:  Post-procedure Vital Signs:  Pulse/HCG Rate: 65 (nsr)  Temp: (!) 97 F (36.1 C) Resp: 18 BP: (!) 132/98 SpO2: 96 %  EBL: None  Complications: No immediate post-treatment complications observed by team, or reported by patient.  Note: The patient tolerated the entire procedure well. A repeat set of vitals were taken after the procedure and the patient was kept under observation following institutional policy, for this type of procedure. Post-procedural neurological assessment was performed, showing return to baseline, prior to discharge. The patient was provided with post-procedure discharge instructions, including a section on how to identify potential problems. Should any problems arise concerning this procedure, the patient was given instructions to immediately contact us, at any time, without hesitation. In any case, we plan to contact the patient by telephone for a follow-up status report regarding this interventional procedure.  Comments:  No additional relevant information.  Plan of Care  Orders:  Orders Placed This Encounter  Procedures  . Radiofrequency,Lumbar    Scheduling Instructions:     Side(s): Left-sided     Level: L3-4, L4-5, & L5-S1 Facets (L2, L3, L4, L5, & S1 Medial Branch Nerves)     Sedation: With Sedation.     Timeframe: Today    Order Specific Question:   Where will this procedure be  performed?    Answer:   [facility]  . Radiofrequency,Lumbar    Standing Status:   Future    Standing Expiration Date:   07/26/2020    Scheduling Instructions:  Side(s): Right-sided     Level: L3-4, L4-5, & L5-S1 Facets (L2, L3, L4, L5, & S1 Medial Branch Nerves)     Sedation: With Sedation.     Scheduling Timeframe: 2 weeks from now    Order Specific Question:   Where will this procedure be performed?    Answer:   [facility]  . DG PAIN CLIN[facility] C-ARM 1-60 MIN NO REPORT    Intraoperative interpretation by procedural physician at [facility].    Standing Status:   Standing    Number of Occurrences:   1    Order Specific Question:   Reason for exam:    Answer:   Assistance in needle guidance and placement for procedures requiring needle placement in or near specific anatomical locations not easily accessible without such assistance.  . Informed Consent Details: Physician/Practitioner Attestation; Transcribe to consent form and obtain patient signature    Nursing Order: Transcribe to consent form and obtain patient signature. Note: Always confirm laterality of pain with [name] Whitaker, before procedure.    Order Specific Question:   Physician/Practitioner attestation of informed consent for procedure/surgical case    Answer:   I, the physician/practitioner, attest that I have discussed with the patient the benefits, risks, side effects, alternatives, likelihood of achieving goals and potential problems during recovery for the procedure that I have provided informed consent.    Order Specific Question:   Procedure    Answer:   Lumbar Facet Radiofrequency Ablation    Order Specific Question:   Physician/Practitioner performing the procedure    Answer:   Anjeli Casad A. Dossie Arbour, MD    Order Specific Question:   Indication/Reason    Answer:   Low Back Pain, with our without leg pain, due to Facet Joint Arthralgia (Joint Pain) known as Lumbar Facet Syndrome, secondary to  Lumbar, and/or Lumbosacral Spondylosis (Arthritis of the Spine), without myelopathy or radiculopathy (Nerve Damage).  . Care order/instruction: Please confirm that the patient has stopped the Xarelto (Rivaroxaban) x 3 days prior to procedure or surgery.    Please confirm that the patient has stopped the Xarelto (Rivaroxaban) x 3 days prior to procedure or surgery.    Standing Status:   Standing    Number of Occurrences:   1  . Provide equipment / supplies at bedside    "Radiofrequency Tray"; Large hemostat (x1); Small hemostat (x1); Towels (x8); 4x4 sterile sponge pack (x1) Needle type: Teflon-coated Radiofrequency Needle (Disposable  single use) Size: Long Quantity: 5    Standing Status:   Standing    Number of Occurrences:   1    Order Specific Question:   Specify    Answer:   Radiofrequency Tray  . Blood Thinner Instructions to Nursing    Always make sure patient has clearance from prescribing physician to stop blood thinners for interventional therapies. If the patient requires a Lovenox-bridge therapy, make sure arrangements are made to institute it with the assistance of the PCP.    Scheduling Instructions:     Have [name] Whitaker stop the Xarelto (Rivaroxaban) x 3 days prior to procedure or surgery.  . Bleeding precautions    Standing Status:   Standing    Number of Occurrences:   1   Chronic Opioid Analgesic:  No opioid analgesics prescribed by our practice. Highest recorded MME/day: 26.67 mg/day MME/day: 0 mg/day   Medications ordered for procedure: Meds ordered this encounter  Medications  . lidocaine (XYLOCAINE) 2 % (with pres) injection 400 mg  .  ropivacaine (PF) 2 mg/mL (0.2%) (NAROPIN) injection 9 mL  . triamcinolone acetonide (KENALOG-40) injection 40 mg  . HYDROcodone-acetaminophen (NORCO/VICODIN) 5-325 MG tablet    Sig: Take 1 tablet by mouth every 8 (eight) hours as needed for up to 7 days for severe pain. Must last 7 days.    Dispense:  21 tablet    Refill:  0     For acute post-operative pain. Not to be refilled. Must last 7 days.  Marland Kitchen HYDROcodone-acetaminophen (NORCO/VICODIN) 5-325 MG tablet    Sig: Take 1 tablet by mouth every 8 (eight) hours as needed for up to 7 days for severe pain. Must last for 7 days.    Dispense:  21 tablet    Refill:  0    For acute post-operative pain. Not to be refilled. Must last 7 days.   Medications administered: We administered lidocaine, ropivacaine (PF) 2 mg/mL (0.2%), and triamcinolone acetonide.  See the medical record for exact dosing, route, and time of administration.  Follow-up plan:   Return in about 2 weeks (around 06/11/2020) for RFA ( ): (R) L-FCT RFA #1.      Interventional Therapies  Risk  Complexity Considerations:   NOTE: Xarelto Anticoagulation (Stop: 3 days  Restart: 6 hours)   Planned  Pending:   Therapeutic bilateral lumbar facet medial branch RFA, starting with the right side.   Under consideration:   Possible right opturator + femoral NB  Possible right opturator + femoral nerve RFA  Possible right SI joint RFA  Diagnostic right lumbar facet block  Possible right lumbar facet RFA (the patient indicates having had approximately 5 prior RFA)   Completed:   Therapeutic right IA hip joint injection x2 (100/100/50) Diagnostic bilateral SI joint block x2 (50/50/100 x 5 wks/0)   Palliative options:   Palliative right IA hip joint injection #2  (last done 04/30/2019) Palliative bilateral SI joint block #2     Recent Visits Date Type Provider Dept  05/20/20 Office Visit Delano Metz, MD [facility]  05/07/20 Procedure visit Delano Metz, MD [facility]  04/29/20 Telemedicine Delano Metz, MD [facility]  03/10/20 Procedure visit Delano Metz, MD [facility]  Showing recent visits within past 90 days and meeting all other requirements Today's Visits Date Type Provider Dept  05/28/20 Procedure visit Delano Metz, MD  [facility]  Showing today's visits and meeting all other requirements Future Appointments Date Type Provider Dept  06/11/20 Appointment Delano Metz, MD [facility]  Showing future appointments within next 90 days and meeting all other requirements  Disposition: Discharge home  Discharge (Date  Time): 05/28/2020; 1129 hrs.   Primary Care Physician: Juluis Rainier, MD Location: [facility] Note by: Oswaldo Done, MD Date: 05/28/2020; Time: 12:55 PM  Disclaimer:  Medicine is not an Visual merchandiser. The only guarantee in medicine is that nothing is guaranteed. It is important to note that the decision to proceed with this intervention was based on the information collected from the patient. The Data and conclusions were drawn from the patient's questionnaire, the interview, and the physical examination. Because the information was provided in large part by the patient, it cannot be guaranteed that it has not been purposely or unconsciously manipulated. Every effort has been made to obtain as much relevant data as possible for this evaluation. It is important to note that the conclusions that lead to this procedure are derived in large part from the available data. Always take  into account that the treatment will also be dependent on availability of resources and existing treatment guidelines, considered by other Pain Management Practitioners as being common knowledge and practice, at the time of the intervention. For Medico-Legal purposes, it is also important to point out that variation in procedural techniques and pharmacological choices are the acceptable norm. The indications, contraindications, technique, and results of the above procedure should only be interpreted and judged by a Board-Certified Interventional Pain Specialist with extensive familiarity and expertise in the same exact procedure and technique.

## 2020-05-29 ENCOUNTER — Telehealth: Payer: Self-pay | Admitting: *Deleted

## 2020-05-29 NOTE — Telephone Encounter (Signed)
Attempted to call for post procedure follow-up. Mailbox full, unable to leave a message. 

## 2020-06-11 ENCOUNTER — Other Ambulatory Visit: Payer: Self-pay

## 2020-06-11 ENCOUNTER — Encounter: Payer: Self-pay | Admitting: Pain Medicine

## 2020-06-11 ENCOUNTER — Ambulatory Visit (HOSPITAL_BASED_OUTPATIENT_CLINIC_OR_DEPARTMENT_OTHER): Payer: No Typology Code available for payment source | Admitting: Pain Medicine

## 2020-06-11 ENCOUNTER — Ambulatory Visit
Admission: RE | Admit: 2020-06-11 | Discharge: 2020-06-11 | Disposition: A | Discharge status: Home / Self Care | Payer: No Typology Code available for payment source | Source: Ambulatory Visit | Attending: Pain Medicine | Admitting: Pain Medicine

## 2020-06-11 VITALS — BP 113/79 | HR 68 | Temp 97.5°F | Resp 15 | Ht 71.0 in | Wt 255.0 lb

## 2020-06-11 DIAGNOSIS — G8929 Other chronic pain: Secondary | ICD-10-CM | POA: Insufficient documentation

## 2020-06-11 DIAGNOSIS — M47816 Spondylosis without myelopathy or radiculopathy, lumbar region: Secondary | ICD-10-CM | POA: Diagnosis present

## 2020-06-11 DIAGNOSIS — M5137 Other intervertebral disc degeneration, lumbosacral region: Secondary | ICD-10-CM | POA: Insufficient documentation

## 2020-06-11 DIAGNOSIS — G8918 Other acute postprocedural pain: Secondary | ICD-10-CM

## 2020-06-11 DIAGNOSIS — M545 Low back pain, unspecified: Secondary | ICD-10-CM

## 2020-06-11 MED ORDER — FENTANYL CITRATE (PF) 100 MCG/2ML IJ SOLN
25.0000 ug | INTRAMUSCULAR | Status: DC | PRN
  Administered 2020-06-11: 50 ug via INTRAVENOUS
  Filled 2020-06-11: qty 2

## 2020-06-11 MED ORDER — HYDROCODONE-ACETAMINOPHEN 5-325 MG PO TABS: 1.0000 | ORAL_TABLET | Freq: Three times a day (TID) | ORAL | 0 refills | Status: DC | PRN

## 2020-06-11 MED ORDER — TRIAMCINOLONE ACETONIDE 40 MG/ML IJ SUSP
40.0000 mg | Freq: Once | INTRAMUSCULAR | Status: AC
  Administered 2020-06-11: 40 mg
  Filled 2020-06-11: qty 1

## 2020-06-11 MED ORDER — LIDOCAINE HCL 2 % IJ SOLN
20.0000 mL | Freq: Once | INTRAMUSCULAR | Status: AC
  Administered 2020-06-11: 200 mg
  Filled 2020-06-11: qty 20

## 2020-06-11 MED ORDER — MIDAZOLAM HCL 5 MG/5ML IJ SOLN
1.0000 mg | INTRAMUSCULAR | Status: DC | PRN
  Administered 2020-06-11: 1 mg via INTRAVENOUS
  Filled 2020-06-11: qty 5

## 2020-06-11 MED ORDER — LACTATED RINGERS IV SOLN
1000.0000 mL | Freq: Once | INTRAVENOUS | Status: AC
  Administered 2020-06-11: 1000 mL via INTRAVENOUS

## 2020-06-11 MED ORDER — ROPIVACAINE HCL 2 MG/ML IJ SOLN
9.0000 mL | Freq: Once | INTRAMUSCULAR | Status: AC
  Administered 2020-06-11: 9 mL via PERINEURAL
  Filled 2020-06-11: qty 10

## 2020-06-11 NOTE — Progress Notes (Signed)
PROVIDER NOTE: Information contained herein reflects review and annotations entered in association with encounter. Interpretation of such information and data should be left to medically-trained personnel. Information provided to patient can be located elsewhere in the medical record under "Patient Instructions". Document created using STT-dictation technology, any transcriptional errors that may result from process are unintentional.    Patient: [name] Whitaker  Service Category: Procedure  Provider: Gaspar Cola, MD  DOB: 1951-02-06  DOS: 06/11/2020  Location: [facility]  MRN: [id number]  Setting: Ambulatory - outpatient  Referring Provider: Leighton Ruff, MD  Type: Established Patient  Specialty: Interventional Pain Management  PCP: Leighton Ruff, MD   Primary Reason for Visit: Interventional Pain Management Treatment. CC: Back Pain (Low right)  Procedure:          Anesthesia, Analgesia, Anxiolysis:  Type: Thermal Lumbar Facet, Medial Branch Radiofrequency Ablation/Neurotomy  #1  Primary Purpose: Therapeutic Region: Posterolateral Lumbosacral Spine Level: L2, L3, L4, L5, & S1 Medial Branch Level(s). These levels will denervate the L3-4, L4-5, and the L5-S1 lumbar facet joints. Laterality: Right  Type: Moderate (Conscious) Sedation combined with Local Anesthesia Indication(s): Analgesia and Anxiety Route: Intravenous (IV) IV Access: Secured Sedation: Meaningful verbal contact was maintained at all times during the procedure  Local Anesthetic: Lidocaine 1-2%  Position: Prone   Indications: 1. Lumbar facet joint syndrome (Bilateral) (R>L)   2. Lumbar facet hypertrophy (Multilevel)    3. Spondylosis of lumbar region without myelopathy or radiculopathy   4. DDD (degenerative disc disease), lumbosacral   5. Chronic low back pain (1ry area of Pain) (Bilateral) (R>L) w/o sciatica    Mr. Kobashigawa has been dealing with the above chronic pain for longer than three  months and has either failed to respond, was unable to tolerate, or simply did not get enough benefit from other more conservative therapies including, but not limited to: 1. Over-the-counter medications 2. Anti-inflammatory medications 3. Muscle relaxants 4. Membrane stabilizers 5. Opioids 6. Physical therapy and/or chiropractic manipulation 7. Modalities (Heat, ice, etc.) 8. Invasive techniques such as nerve blocks. Mr. Filipovich has attained more than 50% relief of the pain from a series of diagnostic injections conducted in separate occasions.  Pain Score: Pre-procedure: 7 /10 Post-procedure: 0-No pain/10  Post-Procedure Evaluation  Procedure (05/28/2020): Therapeutic left lumbar facet MBB RFA #1 under fluoroscopic guidance and IV sedation Pre-procedure pain level: 5/10 Post-procedure: 0/10 (100% relief)  Sedation: Sedation provided.  Effectiveness during initial hour after procedure(Ultra-Short Term Relief): 100 %.  Local anesthetic used: Long-acting (4-6 hours) Effectiveness: Defined as any analgesic benefit obtained secondary to the administration of local anesthetics. This carries significant diagnostic value as to the etiological location, or anatomical origin, of the pain. Duration of benefit is expected to coincide with the duration of the local anesthetic used.  Effectiveness during initial 4-6 hours after procedure(Short-Term Relief): 100 %.  Long-term benefit: Defined as any relief past the pharmacologic duration of the local anesthetics.  Effectiveness past the initial 6 hours after procedure(Long-Term Relief): 95 % (at the 2 week mark).  Current benefits: Defined as benefit that persist at this time.   Analgesia:  At this point, he has not been 6 weeks yet since he had his first left-sided lumbar facet medial branch radiofrequency ablation.  However, he describes having 95% relief of the pain on the left side. Function: Mr. Giangregorio reports improvement in function ROM:  Mr. Eschete reports improvement in ROM  Pre-op H&P Assessment:  Mr. Murad is a 70 y.o. (year old),  male patient, seen today for interventional treatment. He  has a past surgical history that includes Appendectomy; Nasal septum surgery; Tonsillectomy; Testicle surgery; Cardiac catheterization; and Back surgery. Mr. Kleinke has a current medication list which includes the following prescription(s): acetaminophen, allopurinol, chlorthalidone, cholecalciferol, cyclobenzaprine, epinephrine, famotidine, finasteride, gabapentin, galcanezumab-gnlm, glipizide, hydrocodone-acetaminophen, [START ON 06/18/2020] hydrocodone-acetaminophen, ketoconazole, loratadine, losartan, methocarbamol, potassium chloride sa, propranolol, rivaroxaban, and tramadol, and the following Facility-Administered Medications: fentanyl and midazolam. His primarily concern today is the Back Pain (Low right)  Initial Vital Signs:  Pulse/HCG Rate: 68ECG Heart Rate: 77 Temp: (!) 97 F (36.1 C) Resp: 18 BP: (!) 141/92 SpO2: 100 %  BMI: Estimated body mass index is 35.57 kg/m as calculated from the following:   Height as of this encounter: 5\' 11"  (1.803 m).   Weight as of this encounter: 255 lb (115.7 kg).  Risk Assessment: Allergies: Reviewed. He is allergic to penicillins, amlodipine, amlodipine besylate, atorvastatin, erenumab-aooe, lisinopril, penicillin g, and simvastatin.  Allergy Precautions: None required Coagulopathies: Reviewed. None identified.  Blood-thinner therapy: None at this time Active Infection(s): Reviewed. None identified. Mr. Bonar is afebrile  Site Confirmation: Mr. Balling was asked to confirm the procedure and laterality before marking the site Procedure checklist: Completed Consent: Before the procedure and under the influence of no sedative(s), amnesic(s), or anxiolytics, the patient was informed of the treatment options, risks and possible complications. To fulfill our ethical and legal  obligations, as recommended by the American Medical Association's Code of Ethics, I have informed the patient of my clinical impression; the nature and purpose of the treatment or procedure; the risks, benefits, and possible complications of the intervention; the alternatives, including doing nothing; the risk(s) and benefit(s) of the alternative treatment(s) or procedure(s); and the risk(s) and benefit(s) of doing nothing. The patient was provided information about the general risks and possible complications associated with the procedure. These may include, but are not limited to: failure to achieve desired goals, infection, bleeding, organ or nerve damage, allergic reactions, paralysis, and death. In addition, the patient was informed of those risks and complications associated to Spine-related procedures, such as failure to decrease pain; infection (i.e.: Meningitis, epidural or intraspinal abscess); bleeding (i.e.: epidural hematoma, subarachnoid hemorrhage, or any other type of intraspinal or peri-dural bleeding); organ or nerve damage (i.e.: Any type of peripheral nerve, nerve root, or spinal cord injury) with subsequent damage to sensory, motor, and/or autonomic systems, resulting in permanent pain, numbness, and/or weakness of one or several areas of the body; allergic reactions; (i.e.: anaphylactic reaction); and/or death. Furthermore, the patient was informed of those risks and complications associated with the medications. These include, but are not limited to: allergic reactions (i.e.: anaphylactic or anaphylactoid reaction(s)); adrenal axis suppression; blood sugar elevation that in diabetics may result in ketoacidosis or comma; water retention that in patients with history of congestive heart failure may result in shortness of breath, pulmonary edema, and decompensation with resultant heart failure; weight gain; swelling or edema; medication-induced neural toxicity; particulate matter embolism and  blood vessel occlusion with resultant organ, and/or nervous system infarction; and/or aseptic necrosis of one or more joints. Finally, the patient was informed that Medicine is not an exact science; therefore, there is also the possibility of unforeseen or unpredictable risks and/or possible complications that may result in a catastrophic outcome. The patient indicated having understood very clearly. We have given the patient no guarantees and we have made no promises. Enough time was given to the patient to ask questions, all of which  were answered to the patient's satisfaction. Mr. Sama has indicated that he wanted to continue with the procedure. Attestation: I, the ordering provider, attest that I have discussed with the patient the benefits, risks, side-effects, alternatives, likelihood of achieving goals, and potential problems during recovery for the procedure that I have provided informed consent. Date  Time: 06/11/2020  9:15 AM  Pre-Procedure Preparation:  Monitoring: As per clinic protocol. Respiration, ETCO2, SpO2, BP, heart rate and rhythm monitor placed and checked for adequate function Safety Precautions: Patient was assessed for positional comfort and pressure points before starting the procedure. Time-out: I initiated and conducted the "Time-out" before starting the procedure, as per protocol. The patient was asked to participate by confirming the accuracy of the "Time Out" information. Verification of the correct person, site, and procedure were performed and confirmed by me, the nursing staff, and the patient. "Time-out" conducted as per Joint Commission's Universal Protocol (UP.01.01.01). Time: 0937  Description of Procedure:          Laterality: Right Levels:  L2, L3, L4, L5, & S1 Medial Branch Level(s), at the L3-4, L4-5, and the L5-S1 lumbar facet joints. Area Prepped: Lumbosacral DuraPrep (Iodine Povacrylex [0.7% available iodine] and Isopropyl Alcohol, 74% w/w) Safety  Precautions: Aspiration looking for blood return was conducted prior to all injections. At no point did we inject any substances, as a needle was being advanced. Before injecting, the patient was told to immediately notify me if he was experiencing any new onset of "ringing in the ears, or metallic taste in the mouth". No attempts were made at seeking any paresthesias. Safe injection practices and needle disposal techniques used. Medications properly checked for expiration dates. SDV (single dose vial) medications used. After the completion of the procedure, all disposable equipment used was discarded in the proper designated medical waste containers. Local Anesthesia: Protocol guidelines were followed. The patient was positioned over the fluoroscopy table. The area was prepped in the usual manner. The time-out was completed. The target area was identified using fluoroscopy. A 12-in long, straight, sterile hemostat was used with fluoroscopic guidance to locate the targets for each level blocked. Once located, the skin was marked with an approved surgical skin marker. Once all sites were marked, the skin (epidermis, dermis, and hypodermis), as well as deeper tissues (fat, connective tissue and muscle) were infiltrated with a small amount of a short-acting local anesthetic, loaded on a 10cc syringe with a 25G, 1.5-in  Needle. An appropriate amount of time was allowed for local anesthetics to take effect before proceeding to the next step. Local Anesthetic: Lidocaine 2.0% The unused portion of the local anesthetic was discarded in the proper designated containers. Technical explanation of process:  Radiofrequency Ablation (RFA) L2 Medial Branch Nerve RFA: The target area for the L2 medial branch is at the junction of the postero-lateral aspect of the superior articular process and the superior, posterior, and medial edge of the transverse process of L3. Under fluoroscopic guidance, a Radiofrequency needle was  inserted until contact was made with os over the superior postero-lateral aspect of the pedicular shadow (target area). Sensory and motor testing was conducted to properly adjust the position of the needle. Once satisfactory placement of the needle was achieved, the numbing solution was slowly injected after negative aspiration for blood. 2.0 mL of the nerve block solution was injected without difficulty or complication. After waiting for at least 3 minutes, the ablation was performed. Once completed, the needle was removed intact. L3 Medial Branch Nerve RFA: The  target area for the L3 medial branch is at the junction of the postero-lateral aspect of the superior articular process and the superior, posterior, and medial edge of the transverse process of L4. Under fluoroscopic guidance, a Radiofrequency needle was inserted until contact was made with os over the superior postero-lateral aspect of the pedicular shadow (target area). Sensory and motor testing was conducted to properly adjust the position of the needle. Once satisfactory placement of the needle was achieved, the numbing solution was slowly injected after negative aspiration for blood. 2.0 mL of the nerve block solution was injected without difficulty or complication. After waiting for at least 3 minutes, the ablation was performed. Once completed, the needle was removed intact. L4 Medial Branch Nerve RFA: The target area for the L4 medial branch is at the junction of the postero-lateral aspect of the superior articular process and the superior, posterior, and medial edge of the transverse process of L5. Under fluoroscopic guidance, a Radiofrequency needle was inserted until contact was made with os over the superior postero-lateral aspect of the pedicular shadow (target area). Sensory and motor testing was conducted to properly adjust the position of the needle. Once satisfactory placement of the needle was achieved, the numbing solution was slowly  injected after negative aspiration for blood. 2.0 mL of the nerve block solution was injected without difficulty or complication. After waiting for at least 3 minutes, the ablation was performed. Once completed, the needle was removed intact. L5 Medial Branch Nerve RFA: The target area for the L5 medial branch is at the junction of the postero-lateral aspect of the superior articular process of S1 and the superior, posterior, and medial edge of the sacral ala. Under fluoroscopic guidance, a Radiofrequency needle was inserted until contact was made with os over the superior postero-lateral aspect of the pedicular shadow (target area). Sensory and motor testing was conducted to properly adjust the position of the needle. Once satisfactory placement of the needle was achieved, the numbing solution was slowly injected after negative aspiration for blood. 2.0 mL of the nerve block solution was injected without difficulty or complication. After waiting for at least 3 minutes, the ablation was performed. Once completed, the needle was removed intact. S1 Medial Branch Nerve RFA: The target area for the S1 medial branch is located inferior to the junction of the S1 superior articular process and the L5 inferior articular process, posterior, inferior, and lateral to the 6 o'clock position of the L5-S1 facet joint, just superior to the S1 posterior foramen. Under fluoroscopic guidance, the Radiofrequency needle was advanced until contact was made with os over the Target area. Sensory and motor testing was conducted to properly adjust the position of the needle. Once satisfactory placement of the needle was achieved, the numbing solution was slowly injected after negative aspiration for blood. 2.0 mL of the nerve block solution was injected without difficulty or complication. After waiting for at least 3 minutes, the ablation was performed. Once completed, the needle was removed intact. Radiofrequency lesioning (ablation):   Radiofrequency Generator: NeuroTherm [id number] Sensory Stimulation Parameters: 50 Hz was used to locate & identify the nerve, making sure that the needle was positioned such that there was no sensory stimulation below 0.3 V or above 0.7 V. Motor Stimulation Parameters: 2 Hz was used to evaluate the motor component. Care was taken not to lesion any nerves that demonstrated motor stimulation of the lower extremities at an output of less than 2.5 times that of the sensory threshold, or a maximum  of 2.0 V. Lesioning Technique Parameters: Standard Radiofrequency settings. (Not bipolar or pulsed.) Temperature Settings: 80 degrees C Lesioning time: 60 seconds Intra-operative Compliance: Compliant Materials & Medications: Needle(s) (Electrode/Cannula) Type: Teflon-coated, curved tip, Radiofrequency needle(s) Gauge: 22G Length: 10cm Numbing solution: 0.2% PF-Ropivacaine + Triamcinolone (40 mg/mL) diluted to a final concentration of 4 mg of Triamcinolone/mL of Ropivacaine The unused portion of the solution was discarded in the proper designated containers.  Once the entire procedure was completed, the treated area was cleaned, making sure to leave some of the prepping solution back to take advantage of its long term bactericidal properties.  Illustration of the posterior view of the lumbar spine and the posterior neural structures. Laminae of L2 through S1 are labeled. DPRL5, dorsal primary ramus of L5; DPRS1, dorsal primary ramus of S1; DPR3, dorsal primary ramus of L3; FJ, facet (zygapophyseal) joint L3-L4; I, inferior articular process of L4; LB1, lateral branch of dorsal primary ramus of L1; IAB, inferior articular branches from L3 medial branch (supplies L4-L5 facet joint); IBP, intermediate branch plexus; MB3, medial branch of dorsal primary ramus of L3; NR3, third lumbar nerve root; S, superior articular process of L5; SAB, superior articular branches from L4 (supplies L4-5 facet joint also); TP3,  transverse process of L3.  Vitals:   06/11/20 1022 06/11/20 1028 06/11/20 1038 06/11/20 1048  BP: 105/74 115/85 (!) 114/96 113/79  Pulse:      Resp: 12 15 18 15   Temp:  97.7 F (36.5 C)  (!) 97.5 F (36.4 C)  TempSrc:  Temporal  Temporal  SpO2: 96% 97% 97% 98%  Weight:      Height:       Start Time: 0937 hrs. End Time: 1018 hrs.  Imaging Guidance (Spinal):          Type of Imaging Technique: Fluoroscopy Guidance (Spinal) Indication(s): Assistance in needle guidance and placement for procedures requiring needle placement in or near specific anatomical locations not easily accessible without such assistance. Exposure Time: Please see nurses notes. Contrast: None used. Fluoroscopic Guidance: I was personally present during the use of fluoroscopy. "Tunnel Vision Technique" used to obtain the best possible view of the target area. Parallax error corrected before commencing the procedure. "Direction-depth-direction" technique used to introduce the needle under continuous pulsed fluoroscopy. Once target was reached, antero-posterior, oblique, and lateral fluoroscopic projection used confirm needle placement in all planes. Images permanently stored in EMR. Interpretation: No contrast injected. I personally interpreted the imaging intraoperatively. Adequate needle placement confirmed in multiple planes. Permanent images saved into the patient's record.  Antibiotic Prophylaxis:   Anti-infectives (From admission, onward)   None     Indication(s): None identified  Post-operative Assessment:  Post-procedure Vital Signs:  Pulse/HCG Rate: 68(!) 57 Temp: (!) 97.5 F (36.4 C) Resp: 15 BP: 113/79 SpO2: 98 %  EBL: None  Complications: No immediate post-treatment complications observed by team, or reported by patient.  Note: The patient tolerated the entire procedure well. A repeat set of vitals were taken after the procedure and the patient was kept under observation following  institutional policy, for this type of procedure. Post-procedural neurological assessment was performed, showing return to baseline, prior to discharge. The patient was provided with post-procedure discharge instructions, including a section on how to identify potential problems. Should any problems arise concerning this procedure, the patient was given instructions to immediately contact us, at any time, without hesitation. In any case, we plan to contact the patient by telephone for a follow-up status report regarding this  interventional procedure.  Comments:  No additional relevant information.  Plan of Care  Orders:  Orders Placed This Encounter  Procedures  . Radiofrequency,Lumbar    Scheduling Instructions:     Side(s): Right-sided     Level: L3-4, L4-5, & L5-S1 Facets (L2, L3, L4, L5, & S1 Medial Branch Nerves)     Sedation: Patient's choice.     Timeframe: Today    Order Specific Question:   Where will this procedure be performed?    Answer:   [facility]  . DG [facility] C-ARM 1-60 MIN NO REPORT    Intraoperative interpretation by procedural physician at [place].    Standing Status:   Standing    Number of Occurrences:   1    Order Specific Question:   Reason for exam:    Answer:   Assistance in needle guidance and placement for procedures requiring needle placement in or near specific anatomical locations not easily accessible without such assistance.  . Informed Consent Details: Physician/Practitioner Attestation; Transcribe to consent form and obtain patient signature    Nursing Order: Transcribe to consent form and obtain patient signature. Note: Always confirm laterality of pain with Mr. Bakeman, before procedure.    Order Specific Question:   Physician/Practitioner attestation of informed consent for procedure/surgical case    Answer:   I, the physician/practitioner, attest that I have discussed with the patient the benefits, risks, side effects,  alternatives, likelihood of achieving goals and potential problems during recovery for the procedure that I have provided informed consent.    Order Specific Question:   Procedure    Answer:   Lumbar Facet Radiofrequency Ablation    Order Specific Question:   Physician/Practitioner performing the procedure    Answer:   Francisco A. Dossie Arbour, MD    Order Specific Question:   Indication/Reason    Answer:   Low Back Pain, with our without leg pain, due to Facet Joint Arthralgia (Joint Pain) known as Lumbar Facet Syndrome, secondary to Lumbar, and/or Lumbosacral Spondylosis (Arthritis of the Spine), without myelopathy or radiculopathy (Nerve Damage).  . Provide equipment / supplies at bedside    "Radiofrequency Tray"; Large hemostat (x1); Small hemostat (x1); Towels (x8); 4x4 sterile sponge pack (x1) Needle type: Teflon-coated Radiofrequency Needle (Disposable  single use) Size: Long Quantity: 5    Standing Status:   Standing    Number of Occurrences:   1    Order Specific Question:   Specify    Answer:   Radiofrequency Tray   Chronic Opioid Analgesic:  No opioid analgesics prescribed by our practice. Highest recorded MME/day: 26.67 mg/day MME/day: 0 mg/day   Medications ordered for procedure: Meds ordered this encounter  Medications  . HYDROcodone-acetaminophen (NORCO/VICODIN) 5-325 MG tablet    Sig: Take 1 tablet by mouth every 8 (eight) hours as needed for up to 7 days for severe pain. Must last 7 days.    Dispense:  21 tablet    Refill:  0    For acute post-operative pain. Not to be refilled. Must last 7 days.  Marland Kitchen HYDROcodone-acetaminophen (NORCO/VICODIN) 5-325 MG tablet    Sig: Take 1 tablet by mouth every 8 (eight) hours as needed for up to 7 days for severe pain. Must last for 7 days.    Dispense:  21 tablet    Refill:  0    For acute post-operative pain. Not to be refilled. Must last 7 days.  Marland Kitchen lidocaine (XYLOCAINE) 2 % (with pres) injection 400  mg  . lactated ringers infusion  1,000 mL  . midazolam (VERSED) 5 MG/5ML injection 1-2 mg    Make sure Flumazenil is available in the pyxis when using this medication. If oversedation occurs, administer 0.2 mg IV over 15 sec. If after 45 sec no response, administer 0.2 mg again over 1 min; may repeat at 1 min intervals; not to exceed 4 doses (1 mg)  . fentaNYL (SUBLIMAZE) injection 25-50 mcg    Make sure Narcan is available in the pyxis when using this medication. In the event of respiratory depression (RR< 8/min): Titrate NARCAN (naloxone) in increments of 0.1 to 0.2 mg IV at 2-3 minute intervals, until desired degree of reversal.  . ropivacaine (PF) 2 mg/mL (0.2%) (NAROPIN) injection 9 mL  . triamcinolone acetonide (KENALOG-40) injection 40 mg   Medications administered: We administered lidocaine, lactated ringers, midazolam, fentaNYL, ropivacaine (PF) 2 mg/mL (0.2%), and triamcinolone acetonide.  See the medical record for exact dosing, route, and time of administration.  Follow-up plan:   Return in about 6 weeks (around 07/23/2020) for (F2F), (PP) Follow-up.       Interventional Therapies  Risk  Complexity Considerations:   NOTE: Xarelto Anticoagulation (Stop: 3 days  Restart: 6 hours)   Planned  Pending:   Therapeutic bilateral lumbar facet medial branch RFA, starting with the right side.   Under consideration:   Possible right opturator + femoral NB  Possible right opturator + femoral nerve RFA  Possible right SI joint RFA  Diagnostic right lumbar facet block  Possible right lumbar facet RFA (the patient indicates having had approximately 5 prior RFA)   Completed:   Therapeutic right IA hip joint injection x2 (100/100/50) Diagnostic bilateral SI joint block x2 (50/50/100 x 5 wks/0)   Palliative options:   Palliative right IA hip joint injection #2  (last done 04/30/2019) Palliative bilateral SI joint block #2      Recent Visits Date Type Provider Dept  05/28/20 Procedure visit Milinda Pointer, MD  [facility]  05/20/20 Office Visit Milinda Pointer, MD [facility]  05/07/20 Procedure visit Milinda Pointer, MD [facility]  04/29/20 Telemedicine Milinda Pointer, MD [facility]  Showing recent visits within past 90 days and meeting all other requirements Today's Visits Date Type Provider Dept  06/11/20 Procedure visit Milinda Pointer, MD [facility]  Showing today's visits and meeting all other requirements Future Appointments Date Type Provider Dept  07/20/20 Appointment Milinda Pointer, MD [facility]  Showing future appointments within next 90 days and meeting all other requirements  Disposition: Discharge home  Discharge (Date  Time): 06/11/2020; 1053 hrs.   Primary Care Physician: Leighton Ruff, MD Location: [facility] Note by: Gaspar Cola, MD Date: 06/11/2020; Time: 11:36 AM  Disclaimer:  Medicine is not an exact science. The only guarantee in medicine is that nothing is guaranteed. It is important to note that the decision to proceed with this intervention was based on the information collected from the patient. The Data and conclusions were drawn from the patient's questionnaire, the interview, and the physical examination. Because the information was provided in large part by the patient, it cannot be guaranteed that it has not been purposely or unconsciously manipulated. Every effort has been made to obtain as much relevant data as possible for this evaluation. It is important to note that the conclusions that lead to this procedure are derived in large part from the available data. Always take into account  that the treatment will also be dependent on availability of resources and existing treatment guidelines, considered by other Pain Management Practitioners as being common knowledge and practice, at the time of the intervention. For Medico-Legal purposes, it is  also important to point out that variation in procedural techniques and pharmacological choices are the acceptable norm. The indications, contraindications, technique, and results of the above procedure should only be interpreted and judged by a Board-Certified Interventional Pain Specialist with extensive familiarity and expertise in the same exact procedure and technique.

## 2020-06-11 NOTE — Progress Notes (Signed)
Safety precautions to be maintained throughout the outpatient stay will include: orient to surroundings, keep bed in low position, maintain call bell within reach at all times, provide assistance with transfer out of bed and ambulation.  

## 2020-06-11 NOTE — Patient Instructions (Signed)

## 2020-06-12 ENCOUNTER — Telehealth: Payer: Self-pay

## 2020-06-12 NOTE — Telephone Encounter (Signed)
Post procedure phone call.  LM 

## 2020-06-26 DIAGNOSIS — G43109 Migraine with aura, not intractable, without status migrainosus: Secondary | ICD-10-CM | POA: Diagnosis not present

## 2020-06-26 DIAGNOSIS — D352 Benign neoplasm of pituitary gland: Secondary | ICD-10-CM | POA: Diagnosis not present

## 2020-06-26 DIAGNOSIS — G4733 Obstructive sleep apnea (adult) (pediatric): Secondary | ICD-10-CM | POA: Diagnosis not present

## 2020-07-01 ENCOUNTER — Other Ambulatory Visit: Payer: Self-pay | Admitting: Neurology

## 2020-07-01 DIAGNOSIS — D352 Benign neoplasm of pituitary gland: Secondary | ICD-10-CM

## 2020-07-14 ENCOUNTER — Ambulatory Visit
Admission: RE | Admit: 2020-07-14 | Discharge: 2020-07-14 | Disposition: A | Discharge status: Home / Self Care | Payer: No Typology Code available for payment source | Source: Ambulatory Visit | Attending: Neurology | Admitting: Neurology

## 2020-07-14 ENCOUNTER — Other Ambulatory Visit: Payer: Self-pay

## 2020-07-14 DIAGNOSIS — D352 Benign neoplasm of pituitary gland: Secondary | ICD-10-CM

## 2020-07-14 MED ORDER — GADOBUTROL 1 MMOL/ML IV SOLN
10.0000 mL | Freq: Once | INTRAVENOUS | Status: AC | PRN
  Administered 2020-07-14: 7 mL via INTRAVENOUS

## 2020-07-19 NOTE — Progress Notes (Addendum)
PROVIDER NOTE: Information contained herein reflects review and annotations entered in association with encounter. Interpretation of such information and data should be left to medically-trained personnel. Information provided to patient can be located elsewhere in the medical record under "Patient Instructions". Document created using STT-dictation technology, any transcriptional errors that may result from process are unintentional.    Patient: [name] Whitaker  Service Category: E/M  Provider: Oswaldo Done, MD  DOB: [birth year]  DOS: 07/20/2020  Specialty: Interventional Pain Management  MRN: [id number]  Setting: Ambulatory outpatient  PCP: Juluis Rainier, MD  Type: Established Patient    Referring Provider: Juluis Rainier, MD  Location: Office  Delivery: Face-to-face     HPI  Mr. Roman Dubuc, a 70 y.o. year old male, is here today because of his Chronic pain syndrome [G89.4]. Mr. Pinkett's primary complain today is Back Pain (back) Last encounter: My last encounter with him was on 06/11/2020. Pertinent problems: Mr. 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 Pain Assessment: Severity of Chronic pain is reported as a 0-No pain/10. Location: Back  /Denies. Onset: More than a month ago. Quality: Aching,Heaviness. Timing: Intermittent. Modifying factor(s): procedures. Vitals:  height is 5\' 11"  (1.803 m) and weight is 256 lb (116.1 kg). His temperature is 96.5 F (35.8 C) (abnormal). His blood pressure is 113/90 and his pulse is 88. His oxygen saturation is 100%.   Reason for encounter: post-procedure assessment. The patient returns today after having had a therapeutic right lumbar facet RFA #1 done on 06/11/2020, following a therapeutic left lumbar facet RFA #1 on 05/28/2020.  This treatment has provided the patient with 100% ongoing relief of his low back pain.  We will see him on a as needed basis.  Post-Procedure Evaluation  Procedure (06/11/2020): Therapeutic right lumbar facet RFA #1 under fluoroscopic guidance and IV sedation Pre-procedure pain level: 7/10 Post-procedure: 0/10 (100% relief)  Sedation: Sedation provided.  Effectiveness during initial hour after procedure(Ultra-Short Term Relief): 100 %.  Local anesthetic used: Long-acting (4-6 hours) Effectiveness: Defined as any analgesic benefit obtained secondary to the administration of local anesthetics. This carries significant diagnostic value as to the etiological location, or anatomical origin, of the pain. Duration of benefit is expected to coincide with the duration of the local anesthetic used.  Effectiveness during initial 4-6 hours after procedure(Short-Term Relief): 100 %.  Long-term benefit: Defined as any relief past the pharmacologic duration of the local anesthetics.  Effectiveness past the initial 6 hours after procedure(Long-Term Relief): 100 % (ongoing).  Current benefits: Defined as benefit that persist at this time.   Analgesia: The patient describes having attained an ongoing 100% improvement of his low back pain.  He  currently describes having none. Function: Mr. Broughton reports improvement in function ROM: Mr. Vecchio reports improvement in ROM  Pharmacotherapy Assessment   Analgesic: No opioid analgesics prescribed by  our practice. Highest recorded MME/day: 26.67 mg/day MME/day: 0 mg/day   Monitoring: [place] PMP: PDMP reviewed during this encounter.       Pharmacotherapy: No side-effects or adverse reactions reported. Compliance: No problems identified. Effectiveness: Clinically acceptable.  Chauncey Fischer, RN  07/20/2020  8:15 AM  Sign when Signing Visit Safety precautions to be maintained throughout the outpatient stay will include: orient to surroundings, keep bed in low position, maintain call bell within reach at all times, provide assistance with transfer out of bed and ambulation.     UDS: No results found for: SUMMARY   ROS  Constitutional: Denies any fever or chills Gastrointestinal: No reported hemesis, hematochezia, vomiting, or acute GI distress Musculoskeletal: Denies any acute onset joint swelling, redness, loss of ROM, or weakness Neurological: No reported episodes of acute onset apraxia, aphasia, dysarthria, agnosia, amnesia, paralysis, loss of coordination, or loss of consciousness  Medication Review  Cholecalciferol, EPINEPHrine, Galcanezumab-gnlm, acetaminophen, allopurinol, chlorthalidone, cyclobenzaprine, famotidine, finasteride, gabapentin, glipiZIDE, ketoconazole, loratadine, losartan, methocarbamol, potassium chloride SA, propranolol, rivaroxaban, and traMADol  History Review  Allergy: Mr. Luhn is allergic to penicillins, amlodipine, amlodipine besylate, atorvastatin, erenumab-aooe, lisinopril, penicillin g, and simvastatin. Drug: Mr. Naugle  reports no history of drug use. Alcohol:  reports no history of alcohol use. Tobacco:  reports that he quit smoking about 37 years ago. His smoking use included pipe. He quit after 4.00 years of use. He has never used smokeless  tobacco. Social: Mr. Hilburn  reports that he quit smoking about 37 years ago. His smoking use included pipe. He quit after 4.00 years of use. He has never used smokeless tobacco. He reports that he does not drink alcohol and does not use drugs. Medical:  has a past medical history of Abnormal ECG (09/27/2013), Chest pain (09/27/2013), Dilated aortic root ([place]) (09/27/2013), DVT, lower extremity ([place]) (10/27/2011), Dyspnea (06/27/2014), Gout, History of DVT (deep vein thrombosis) (04/14/2019), History of pulmonary embolism (04/14/2019), HTN (hypertension) (10/25/2011), Hypercholesteremia, Hypertension, Migraine, OSA (obstructive sleep apnea), and Pulmonary embolism ([place]). Surgical: Mr. Towson  has a past surgical history that includes Appendectomy; Nasal septum surgery; Tonsillectomy; Testicle surgery; Cardiac catheterization; and Back surgery. Family: family history includes Clotting disorder in his brother; Hypertension in his father and mother.  Laboratory Chemistry Profile   Renal Lab Results  Component Value Date   BUN 15 04/15/2019   CREATININE 1.36 (H) 04/15/2019   BCR 11 04/15/2019   GFRAA 62 04/15/2019   GFRNONAA 54 (L) 04/15/2019     Hepatic Lab Results  Component Value Date   AST 20 04/15/2019   ALT 22 09/03/2013   ALBUMIN 4.6 04/15/2019   ALKPHOS 79 04/15/2019   LIPASE 19 03/15/2007     Electrolytes Lab Results  Component Value Date   NA 145 (H) 04/15/2019   K 4.0 04/15/2019   CL 102 04/15/2019   CALCIUM 9.8 04/15/2019   MG 2.2 04/15/2019     Bone Lab Results  Component Value Date   25OHVITD1 43 04/15/2019   25OHVITD2 <1.0 04/15/2019   25OHVITD3 43 04/15/2019     Inflammation (CRP: Acute Phase) (ESR: Chronic Phase) Lab Results  Component Value Date   CRP 2 04/15/2019   ESRSEDRATE 6 04/15/2019       Note: Above Lab results reviewed.  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  EXAM: MRI  HEAD WITHOUT AND WITH CONTRAST  TECHNIQUE: Multiplanar, multiecho pulse sequences of the brain and  surrounding structures were obtained without and with intravenous contrast.  CONTRAST:  34mL GADAVIST GADOBUTROL 1 MMOL/ML IV SOLN  COMPARISON:  MRI 01/31/2020.  FINDINGS: Brain: Similar size of a mildly enlarged pituitary gland (1 cm craniocaudal on series 20, image 7) which demonstrates T2 heterogeneity and heterogeneous enhancement on dynamic and postcontrast imaging. There is slight suprasellar extension without the lesion contacting the optic chiasm. No evidence of cavernous sinus involvement. Infundibulum is midline and within normal limits.  No acute infarct. No hydrocephalus. No acute hemorrhage. No mass lesion or abnormal mass effect. No extra-axial fluid collection. Patchy white matter T2/FLAIR hyperintensities, most pronounced in the periatrial region and similar to prior. Outside the pituitary gland, no abnormal enhancement.  Vascular: Major arterial flow voids are maintained at the skull base.  Skull and upper cervical spine: Normal marrow signal.  Sinuses/Orbits: Sinuses are clear.  Unremarkable orbits.  Other:   No mastoid effusions.  IMPRESSION: 1. Similar size/appearance of a mildly enlarged pituitary with heterogeneous T2 signal and enhancement. As before, these findings favor a pituitary adenoma. There is slight suprasellar extension without the lesion contacting the optic chiasm or evidence of cavernous sinus involvement. 2. No acute intracranial abnormality. Similar mild to moderate for age nonspecific T2/FLAIR hyperintensities within the white matter.  Electronically Signed   By: Margaretha Sheffield MD   On: 07/15/2020 10:37 Note: Reviewed        Physical Exam  General appearance: Well nourished, well developed, and well hydrated. In no apparent acute distress Mental status: Alert, oriented x 3 (person, place, & time)       Respiratory: No evidence of  acute respiratory distress Eyes: PERLA Vitals: BP 113/90   Pulse 88   Temp (!) 96.5 F (35.8 C)   Ht $R'5\' 11"'nD$  (1.803 m)   Wt 256 lb (116.1 kg)   SpO2 100%   BMI 35.70 kg/m  BMI: Estimated body mass index is 35.7 kg/m as calculated from the following:   Height as of this encounter: $RemoveBeforeD'5\' 11"'[id number]$  (1.803 m).   Weight as of this encounter: 256 lb (116.1 kg). Ideal: Ideal body weight: 75.3 kg (166 lb 0.1 oz) Adjusted ideal body weight: 91.6 kg (202 lb 0.1 oz)  Assessment   Status Diagnosis  Controlled Resolved Resolved 1. Chronic pain syndrome   2. Lumbar facet joint syndrome (Bilateral) (R>L)   3. Chronic low back pain (1ry area of Pain) (Bilateral) (R>L) w/o sciatica   4. Chronic sacroiliac joint pain (Bilateral) (R>L)   5. Chronic anticoagulation (Xarelto)      Updated Problems: Problem  Benign Prostatic Hyperplasia  Steatosis of Liver    Plan of Care  Problem-specific:  No problem-specific Assessment & Plan notes found for this encounter.  Mr. Shan Valdes has a current medication list which includes the following long-term medication(s): allopurinol, chlorthalidone, famotidine, glipizide, loratadine, losartan, potassium chloride sa, propranolol, and rivaroxaban.  Pharmacotherapy (Medications Ordered): No orders of the defined types were placed in this encounter.  Orders:  No orders of the defined types were placed in this encounter.  Follow-up plan:   Return if symptoms worsen or fail to improve.      Interventional Therapies  Risk  Complexity Considerations:   NOTE: Xarelto Anticoagulation (Stop: 3 days  Restart: 6 hours)   Planned  Pending:   Therapeutic bilateral lumbar facet medial branch RFA, starting with the right side.   Under consideration:   Possible right opturator + femoral NB  Possible right opturator + femoral nerve RFA  Possible right SI joint RFA  Diagnostic right lumbar facet block  Possible right lumbar facet RFA (the patient indicates  having had approximately 5 prior RFA)   Completed:   Therapeutic right IA hip joint injection x2 (100/100/50) Diagnostic bilateral SI joint block x2 (50/50/100 x 5 wks/0)  Therapeutic left lumbar facet RFA #1 (05/28/2020) (100% ongoing 6 weeks after procedure)  Therapeutic right lumbar facet RFA #1 (06/11/2020) (100% ongoing 6 weeks after procedure)    Palliative options:   Palliative right IA hip joint injection #2  (last done 04/30/2019) Palliative bilateral SI joint block #2     Recent Visits Date Type Provider Dept  06/11/20 Procedure visit Milinda Pointer, MD [facility]  05/28/20 Procedure visit Milinda Pointer, MD [facility]  05/20/20 Office Visit Milinda Pointer, MD [facility]  05/07/20 Procedure visit Milinda Pointer, MD [facility]  04/29/20 Telemedicine Milinda Pointer, MD [facility]  Showing recent visits within past 90 days and meeting all other requirements Today's Visits Date Type Provider Dept  07/20/20 Office Visit Milinda Pointer, MD [facility]  Showing today's visits and meeting all other requirements Future Appointments No visits were found meeting these conditions. Showing future appointments within next 90 days and meeting all other requirements  I discussed the assessment and treatment plan with the patient. The patient was provided an opportunity to ask questions and all were answered. The patient agreed with the plan and demonstrated an understanding of the instructions.  Patient advised to call back or seek an in-person evaluation if the symptoms or condition worsens.  Duration of encounter: 25 minutes.  Note by: Gaspar Cola, MD Date: 07/20/2020; Time: 8:38 AM

## 2020-07-20 ENCOUNTER — Encounter: Payer: Self-pay | Admitting: Pain Medicine

## 2020-07-20 ENCOUNTER — Other Ambulatory Visit: Payer: Self-pay

## 2020-07-20 ENCOUNTER — Ambulatory Visit: Payer: No Typology Code available for payment source | Attending: Pain Medicine | Admitting: Pain Medicine

## 2020-07-20 VITALS — BP 113/90 | HR 88 | Temp 96.5°F | Ht 71.0 in | Wt 256.0 lb

## 2020-07-20 DIAGNOSIS — M533 Sacrococcygeal disorders, not elsewhere classified: Secondary | ICD-10-CM

## 2020-07-20 DIAGNOSIS — M545 Low back pain, unspecified: Secondary | ICD-10-CM | POA: Diagnosis not present

## 2020-07-20 DIAGNOSIS — K760 Fatty (change of) liver, not elsewhere classified: Secondary | ICD-10-CM | POA: Insufficient documentation

## 2020-07-20 DIAGNOSIS — N400 Enlarged prostate without lower urinary tract symptoms: Secondary | ICD-10-CM | POA: Insufficient documentation

## 2020-07-20 DIAGNOSIS — M47816 Spondylosis without myelopathy or radiculopathy, lumbar region: Secondary | ICD-10-CM | POA: Diagnosis not present

## 2020-07-20 DIAGNOSIS — Z7901 Long term (current) use of anticoagulants: Secondary | ICD-10-CM | POA: Diagnosis present

## 2020-07-20 DIAGNOSIS — G894 Chronic pain syndrome: Secondary | ICD-10-CM | POA: Diagnosis not present

## 2020-07-20 DIAGNOSIS — G8929 Other chronic pain: Secondary | ICD-10-CM | POA: Diagnosis present

## 2020-07-20 NOTE — Progress Notes (Signed)
Safety precautions to be maintained throughout the outpatient stay will include: orient to surroundings, keep bed in low position, maintain call bell within reach at all times, provide assistance with transfer out of bed and ambulation.  

## 2020-08-17 DIAGNOSIS — G43109 Migraine with aura, not intractable, without status migrainosus: Secondary | ICD-10-CM | POA: Diagnosis not present

## 2020-08-17 DIAGNOSIS — E1169 Type 2 diabetes mellitus with other specified complication: Secondary | ICD-10-CM | POA: Diagnosis not present

## 2020-08-17 DIAGNOSIS — D352 Benign neoplasm of pituitary gland: Secondary | ICD-10-CM | POA: Diagnosis not present

## 2020-08-22 DIAGNOSIS — D352 Benign neoplasm of pituitary gland: Secondary | ICD-10-CM | POA: Insufficient documentation

## 2020-08-31 DIAGNOSIS — D497 Neoplasm of unspecified behavior of endocrine glands and other parts of nervous system: Secondary | ICD-10-CM | POA: Insufficient documentation

## 2020-08-31 DIAGNOSIS — D352 Benign neoplasm of pituitary gland: Secondary | ICD-10-CM | POA: Insufficient documentation

## 2020-09-09 ENCOUNTER — Other Ambulatory Visit: Payer: Self-pay

## 2020-09-09 ENCOUNTER — Encounter: Payer: Self-pay | Admitting: Urology

## 2020-09-09 ENCOUNTER — Ambulatory Visit: Payer: Medicare Other | Admitting: Urology

## 2020-09-09 VITALS — BP 121/83 | HR 67 | Ht 70.5 in | Wt 249.0 lb

## 2020-09-09 DIAGNOSIS — R319 Hematuria, unspecified: Secondary | ICD-10-CM | POA: Diagnosis not present

## 2020-09-09 MED ORDER — SULFAMETHOXAZOLE-TRIMETHOPRIM 800-160 MG PO TABS: 1.0000 | ORAL_TABLET | Freq: Two times a day (BID) | ORAL | 0 refills | Status: DC

## 2020-09-09 NOTE — Progress Notes (Signed)
09/09/2020  8:55 AM   [name] Whitaker 19-Jan-1951 [id number]  Referring provider: Aretta Nip, [place],  [place] [address] Chief Complaint  Patient presents with  . Hematuria    HPI: [name] Whitaker is a 70 y.o. 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  Noticed gross hematuria and dysuria on last Friday (4/15). He reports that he typically drinks soda and not enough water.. The episode on Friday lasted all day. Denied any dark discoloration. He denied doing any strenuous physical activity that might have started this.  He was seen in follow-up in his primary care.  He did have moderate blood and mild leukocytes in his urine.  He reports that he has had some urgency frequency in the past few days.  Today though, he had some issues voiding here in our clinic.  Ultimately was successful.  Has a history of CKD with baseline creatinine around 1.3.  Has a personal history of PE and is on chronic anti-coagulant in form of Xarelto.  Former smoker, quit because they were too expensive.    He has a new sexual partner who has a UTI.   He wonders if he is at risk for this.  Veteran, but not [place]. No exposure to Sports administrator.  He said that he's had a history of kidney stones since June 1974. Denied flank pain. Michela Pitcher that he has some pain when he is almost done voiding.  Had prostate biopsy and possibly a cystoscopy in 2008, and thinks his PSA is being followed but is not sure.  PMH: Past Medical History:  Diagnosis Date  . Abnormal ECG 09/27/2013  . Chest pain 09/27/2013  . Dilated aortic root ([place]) 09/27/2013  . DVT, lower extremity ([place]) 10/27/2011  . Dyspnea 06/27/2014   2/5 /2016  Walked RA x 3 laps @ 185 ft each stopped due to  End of study, slow pace min sob  - PFTs 08/01/14  FEV1  2.89 (90%) ratio 81 and nl dlco    . Gout   . History of DVT (deep vein  thrombosis) 04/14/2019  . History of pulmonary embolism 04/14/2019  . HTN (hypertension) 10/25/2011  . Hypercholesteremia   . Hypertension   . Migraine   . OSA (obstructive sleep apnea)    on CPAP   . Pulmonary embolism [facility])     Surgical History: Past Surgical History:  Procedure Laterality Date  . APPENDECTOMY    . BACK SURGERY    . CARDIAC CATHETERIZATION    . NASAL SEPTUM SURGERY    . TESTICLE SURGERY    . TONSILLECTOMY      Home Medications:  Allergies as of 09/09/2020      Reactions   Penicillins Shortness Of Breath   Amlodipine Swelling   Amlodipine Besylate Other (See Comments)   Atorvastatin Other (See Comments)   Erenumab-aooe    unknown   Lisinopril Other (See Comments), Hypertension   Headaches   Penicillin G Other (See Comments)   Simvastatin Other (See Comments)      Medication List       Accurate as of September 09, 2020  8:55 AM. If you have any questions, ask your nurse or doctor.        STOP taking these medications   Galcanezumab-gnlm 120 MG/ML Soaj Stopped by: Hollice Espy, MD   rosuvastatin 20 MG tablet Commonly known as: CRESTOR Stopped by:  Hollice Espy, MD     TAKE these medications   acetaminophen 325 MG tablet Commonly known as: TYLENOL Take 650 mg by mouth as needed.   allopurinol 300 MG tablet Commonly known as: ZYLOPRIM Take 300 mg by mouth daily.   ascorbic acid 500 MG tablet Commonly known as: VITAMIN C Take 1 tablet by mouth daily.   Atogepant 60 MG Tabs Take by mouth.   chlorthalidone 50 MG tablet Commonly known as: HYGROTON Take 25 mg by mouth daily.   Cholecalciferol 25 MCG (1000 UT) tablet Take by mouth.   cyclobenzaprine 10 MG tablet Commonly known as: FLEXERIL Take 10 mg by mouth as needed.   EPINEPHrine 0.3 mg/0.3 mL Soaj injection Commonly known as: EPI-PEN Inject 0.3 mg into the muscle as needed.   famotidine 20 MG tablet Commonly known as: Pepcid One at bedtime   finasteride 5 MG  tablet Commonly known as: PROSCAR Take 5 mg by mouth daily.   gabapentin 400 MG capsule Commonly known as: NEURONTIN Take 800 mg by mouth at bedtime.   glipiZIDE 5 MG tablet Commonly known as: GLUCOTROL Take 5 mg by mouth daily before breakfast.   ketoconazole 2 % cream Commonly known as: NIZORAL Apply 1 application topically daily as needed for irritation (feet).   loratadine 10 MG tablet Commonly known as: CLARITIN Take 10 mg by mouth daily.   losartan 100 MG tablet Commonly known as: COZAAR Take 100 mg by mouth daily.   methocarbamol 500 MG tablet Commonly known as: ROBAXIN Take 1 tablet (500 mg total) by mouth every 8 (eight) hours as needed for muscle spasms.   potassium chloride SA 20 MEQ tablet Commonly known as: KLOR-CON Take 1 tab daily   propranolol 60 MG tablet Commonly known as: INDERAL Take 60 mg by mouth 2 (two) times daily.   propranolol 80 MG 24 hr capsule Commonly known as: INNOPRAN XL TAKE ONE CAPSULE BY MOUTH EVERY DAY FOR MIGRAINE PROPHYLAXIS.   rivaroxaban 20 MG Tabs tablet Commonly known as: XARELTO Take 20 mg by mouth every morning.   topiramate 50 MG tablet Commonly known as: TOPAMAX TAKE ONE TABLET BY MOUTH TWO TIMES A DAY START AFTER COMPLETING 25 MG TABLETS   traMADol 50 MG tablet Commonly known as: ULTRAM Take 50 mg by mouth every 6 (six) hours as needed for moderate pain.       Allergies: Allergies  Allergen Reactions  . Penicillins Shortness Of Breath  . Amlodipine Swelling  . Amlodipine Besylate Other (See Comments)  . Atorvastatin Other (See Comments)  . Erenumab-Aooe     unknown  . Lisinopril Other (See Comments) and Hypertension    Headaches  . Penicillin G Other (See Comments)  . Simvastatin Other (See Comments)    Family History: Family History  Problem Relation Age of Onset  . Hypertension Mother   . Hypertension Father   . Clotting disorder Brother     Social History:   reports that he quit smoking  about 37 years ago. His smoking use included pipe. He quit after 4.00 years of use. He has never used smokeless tobacco. He reports that he does not drink alcohol and does not use drugs.  ROS: Pertinent ROS in HPI.  Physical Exam: BP 121/83   Pulse 67   Ht 5' 10.5" (1.791 m)   Wt 249 lb (112.9 kg)   BMI 35.22 kg/m   Constitutional:  Alert and oriented, No acute distress. HEENT: [place] AT, moist mucus membranes.  Trachea midline,  no masses. Cardiovascular: No clubbing, cyanosis, or edema. Respiratory: Normal respiratory effort, no increased work of breathing. Skin: No rashes, bruises or suspicious lesions. Neurologic: Grossly intact, no focal deficits, moving all 4 extremities. Psychiatric: Normal mood and affect.  Laboratory Data:  Lab Results  Component Value Date   CREATININE 1.36 (H) 04/15/2019    Urinalysis today with many WBCs, RBCs, bacteria and nitrate positive, grossly positive urine  Assessment & Plan:    1. Gross hematuria/ acute cystitis Urinalysis today consistent with infection, will treat with Bactrim DS twice daily for a week and then reevaluate  If his microscopic hematuria persist, consider CT urogram and cystoscopy  UCx today, adjust meds as needed   2. BPH Personal history of BPH/elevated PSA  No recent PSAs for comparison  Chronically on finasteride since 2008, will check PSA today for baseline  Continue finasteride for the time being   Follow Up:  Follow up UCx, CTU and cystoscopy in one month.  Jaclyn Shaggy, am acting as a scribe for Dr. Hollice Espy.    [facility] [address], [place], [place] [address] [phone]   I spent 46 total minutes on the day of the encounter including pre-visit review of the medical record, face-to-face time with the patient, and post visit ordering of labs/imaging/tests.  Primary portion of the time is spent face-to-face with the patient obtaining history.

## 2020-09-10 ENCOUNTER — Telehealth: Payer: Self-pay

## 2020-09-10 LAB — URINALYSIS, COMPLETE
Bilirubin, UA: NEGATIVE
Glucose, UA: NEGATIVE
Nitrite, UA: POSITIVE — AB
Specific Gravity, UA: 1.02 (ref 1.005–1.030)
Urobilinogen, Ur: 1 mg/dL (ref 0.2–1.0)
pH, UA: 6 (ref 5.0–7.5)

## 2020-09-10 LAB — MICROSCOPIC EXAMINATION: WBC, UA: >30 /hpf — AB (ref 0–5)

## 2020-09-10 LAB — PSA: Prostate Specific Ag, Serum: 3.2 ng/mL (ref 0.0–4.0)

## 2020-09-10 NOTE — Telephone Encounter (Signed)
Pt aware of results. Verbalized understanding.

## 2020-09-10 NOTE — Telephone Encounter (Signed)
-----   Message from Hollice Espy, MD sent at 09/10/2020  8:22 AM EDT ----- PSA is within normal limits, 3.2  Hollice Espy, MD

## 2020-09-12 LAB — CULTURE, URINE COMPREHENSIVE

## 2020-09-14 ENCOUNTER — Telehealth: Payer: Self-pay | Admitting: *Deleted

## 2020-09-14 NOTE — Telephone Encounter (Addendum)
Patient informed, voiced understanding   ----- Message from Hollice Espy, MD sent at 09/14/2020 12:59 PM EDT ----- +UCx, appropriate abx prescribed  Hollice Espy, MD

## 2020-10-07 ENCOUNTER — Ambulatory Visit: Payer: Self-pay | Admitting: Urology

## 2020-10-13 ENCOUNTER — Encounter: Payer: Self-pay | Admitting: Ophthalmology

## 2020-10-15 NOTE — Discharge Instructions (Signed)

## 2020-10-20 ENCOUNTER — Ambulatory Visit
Admission: RE | Admit: 2020-10-20 | Discharge: 2020-10-20 | Disposition: A | Discharge status: Home / Self Care | Payer: No Typology Code available for payment source | Attending: Ophthalmology | Admitting: Ophthalmology

## 2020-10-20 ENCOUNTER — Other Ambulatory Visit: Payer: Self-pay

## 2020-10-20 ENCOUNTER — Ambulatory Visit: Payer: No Typology Code available for payment source | Admitting: Anesthesiology

## 2020-10-20 ENCOUNTER — Encounter
Admission: RE | Disposition: A | Discharge status: Home / Self Care | Payer: Self-pay | Source: Home / Self Care | Attending: Ophthalmology

## 2020-10-20 ENCOUNTER — Encounter: Payer: Self-pay | Admitting: Ophthalmology

## 2020-10-20 DIAGNOSIS — H2511 Age-related nuclear cataract, right eye: Secondary | ICD-10-CM | POA: Diagnosis present

## 2020-10-20 DIAGNOSIS — Z79899 Other long term (current) drug therapy: Secondary | ICD-10-CM | POA: Insufficient documentation

## 2020-10-20 DIAGNOSIS — Z880 Allergy status to penicillin: Secondary | ICD-10-CM | POA: Insufficient documentation

## 2020-10-20 DIAGNOSIS — Z7901 Long term (current) use of anticoagulants: Secondary | ICD-10-CM | POA: Insufficient documentation

## 2020-10-20 DIAGNOSIS — E1136 Type 2 diabetes mellitus with diabetic cataract: Secondary | ICD-10-CM | POA: Insufficient documentation

## 2020-10-20 DIAGNOSIS — Z87891 Personal history of nicotine dependence: Secondary | ICD-10-CM | POA: Diagnosis not present

## 2020-10-20 DIAGNOSIS — Z7984 Long term (current) use of oral hypoglycemic drugs: Secondary | ICD-10-CM | POA: Diagnosis not present

## 2020-10-20 DIAGNOSIS — Z888 Allergy status to other drugs, medicaments and biological substances status: Secondary | ICD-10-CM | POA: Insufficient documentation

## 2020-10-20 HISTORY — DX: Prediabetes: R73.03

## 2020-10-20 HISTORY — DX: Gastro-esophageal reflux disease without esophagitis: K21.9

## 2020-10-20 HISTORY — PX: CATARACT EXTRACTION W/PHACO: SHX586

## 2020-10-20 LAB — GLUCOSE, CAPILLARY
Glucose-Capillary: 135 mg/dL — ABNORMAL HIGH (ref 70–99)
Glucose-Capillary: 134 mg/dL — ABNORMAL HIGH (ref 70–99)

## 2020-10-20 SURGERY — PHACOEMULSIFICATION, CATARACT, WITH IOL INSERTION
Anesthesia: Monitor Anesthesia Care | Site: Eye | Laterality: Right

## 2020-10-20 MED ORDER — FENTANYL CITRATE (PF) 100 MCG/2ML IJ SOLN
INTRAMUSCULAR | Status: DC | PRN
  Administered 2020-10-20 (×2): 50 ug via INTRAVENOUS

## 2020-10-20 MED ORDER — NA CHONDROIT SULF-NA HYALURON 40-17 MG/ML IO SOLN
INTRAOCULAR | Status: DC | PRN
  Administered 2020-10-20: 1 mL via INTRAOCULAR

## 2020-10-20 MED ORDER — BRIMONIDINE TARTRATE-TIMOLOL 0.2-0.5 % OP SOLN
OPHTHALMIC | Status: DC | PRN
  Administered 2020-10-20: 1 [drp] via OPHTHALMIC

## 2020-10-20 MED ORDER — TETRACAINE HCL 0.5 % OP SOLN
1.0000 [drp] | OPHTHALMIC | Status: DC | PRN
  Administered 2020-10-20 (×3): 1 [drp] via OPHTHALMIC

## 2020-10-20 MED ORDER — LIDOCAINE HCL (PF) 2 % IJ SOLN
INTRAOCULAR | Status: DC | PRN
  Administered 2020-10-20: 1 mL

## 2020-10-20 MED ORDER — MOXIFLOXACIN HCL 0.5 % OP SOLN
OPHTHALMIC | Status: DC | PRN
  Administered 2020-10-20: 0.2 mL via OPHTHALMIC

## 2020-10-20 MED ORDER — MIDAZOLAM HCL 2 MG/2ML IJ SOLN
INTRAMUSCULAR | Status: DC | PRN
  Administered 2020-10-20: 2 mg via INTRAVENOUS

## 2020-10-20 MED ORDER — EPINEPHRINE PF 1 MG/ML IJ SOLN
INTRAOCULAR | Status: DC | PRN
  Administered 2020-10-20: 115 mL via OPHTHALMIC

## 2020-10-20 MED ORDER — LACTATED RINGERS IV SOLN: INTRAVENOUS | Status: DC

## 2020-10-20 MED ORDER — ARMC OPHTHALMIC DILATING DROPS
1.0000 "application " | OPHTHALMIC | Status: DC | PRN
  Administered 2020-10-20 (×3): 1 via OPHTHALMIC

## 2020-10-20 SURGICAL SUPPLY — 19 items
CANNULA ANT/CHMB 27GA (MISCELLANEOUS) ×4 IMPLANT
GLOVE SURG LX 8.0 MICRO (GLOVE) ×1
GLOVE SURG LX STRL 8.0 MICRO (GLOVE) ×1 IMPLANT
GLOVE SURG TRIUMPH 8.0 PF LTX (GLOVE) ×6 IMPLANT
GOWN STRL REUS W/ TWL LRG LVL3 (GOWN DISPOSABLE) ×2 IMPLANT
GOWN STRL REUS W/TWL LRG LVL3 (GOWN DISPOSABLE) ×4
LENS IOL TECNIS EYHANCE 17.0 (Intraocular Lens) ×2 IMPLANT
MARKER SKIN DUAL TIP RULER LAB (MISCELLANEOUS) ×2 IMPLANT
NEEDLE FILTER BLUNT 18X 1/2SAF (NEEDLE) ×1
NEEDLE FILTER BLUNT 18X1 1/2 (NEEDLE) ×1 IMPLANT
PACK EYE AFTER SURG (MISCELLANEOUS) ×2 IMPLANT
PACK OPTHALMIC (MISCELLANEOUS) ×2 IMPLANT
PACK PORFILIO (MISCELLANEOUS) ×2 IMPLANT
SUT ETHILON 10-0 CS-B-6CS-B-6 (SUTURE)
SUTURE EHLN 10-0 CS-B-6CS-B-6 (SUTURE) IMPLANT
SYR 3ML LL SCALE MARK (SYRINGE) ×2 IMPLANT
SYR TB 1ML LUER SLIP (SYRINGE) ×2 IMPLANT
WATER STERILE IRR 250ML POUR (IV SOLUTION) ×2 IMPLANT
WIPE NON LINTING 3.25X3.25 (MISCELLANEOUS) ×2 IMPLANT

## 2020-10-20 NOTE — H&P (Signed)
[place]   Primary Care Physician:  [facility] Ophthalmologist: Dr. George Ina  Pre-Procedure History & Physical: HPI:  [name] Whitaker is a 70 y.o. male here for cataract surgery.   Past Medical History:  Diagnosis Date  . Abnormal ECG 09/27/2013  . Chest pain 09/27/2013  . Dilated aortic root ([place]) 09/27/2013  . DVT, lower extremity ([place]) 10/27/2011  . Dyspnea 06/27/2014   2/5 /2016  Walked RA x 3 laps @ 185 ft each stopped due to  End of study, slow pace min sob  - PFTs 08/01/14  FEV1  2.89 (90%) ratio 81 and nl dlco    . GERD (gastroesophageal reflux disease)   . Gout   . History of DVT (deep vein thrombosis) 04/14/2019  . History of pulmonary embolism 04/14/2019  . HTN (hypertension) 10/25/2011  . Hypercholesteremia   . Migraine    1-2x/wk  . OSA (obstructive sleep apnea)    on CPAP   . Pre-diabetes     Past Surgical History:  Procedure Laterality Date  . APPENDECTOMY    . BACK SURGERY    . CARDIAC CATHETERIZATION    . NASAL SEPTUM SURGERY    . TESTICLE SURGERY    . TONSILLECTOMY      Prior to Admission medications   Medication Sig Start Date End Date Taking? Authorizing Provider  acetaminophen (TYLENOL) 325 MG tablet Take 650 mg by mouth as needed.   Yes [provider]  allopurinol (ZYLOPRIM) 300 MG tablet Take 300 mg by mouth daily.   Yes [provider]  ascorbic acid (VITAMIN C) 500 MG tablet Take 1 tablet by mouth daily. 01/16/20  Yes [provider]  chlorthalidone (HYGROTON) 50 MG tablet Take 25 mg by mouth daily.   Yes [provider]  Cholecalciferol 25 MCG (1000 UT) tablet Take by mouth. 06/27/19  Yes [provider]  cyclobenzaprine (FLEXERIL) 10 MG tablet Take 10 mg by mouth as needed.    Yes [provider]  EPINEPHrine 0.3 mg/0.3 mL IJ SOAJ injection Inject 0.3 mg into the muscle as needed.   Yes [provider]  famotidine (PEPCID) 20 MG tablet One at bedtime  06/27/14  Yes Tanda Rockers, MD  finasteride (PROSCAR) 5 MG tablet Take 5 mg by mouth daily.   Yes [provider]  fluticasone (FLONASE) 50 MCG/ACT nasal spray Place into both nostrils daily.   Yes [provider]  gabapentin (NEURONTIN) 400 MG capsule Take 800 mg by mouth at bedtime.    Yes [provider]  glipiZIDE (GLUCOTROL) 5 MG tablet Take 5 mg by mouth daily before breakfast.   Yes [provider]  ketoconazole (NIZORAL) 2 % cream Apply 1 application topically daily as needed for irritation (feet).  10/07/13  Yes [provider]  loratadine (CLARITIN) 10 MG tablet Take 10 mg by mouth daily.   Yes [provider]  losartan (COZAAR) 100 MG tablet Take 100 mg by mouth daily.   Yes [provider]  potassium chloride SA (K-DUR,KLOR-CON) 20 MEQ tablet Take 1 tab daily 09/06/13  Yes [provider]  propranolol (INDERAL) 60 MG tablet Take 60 mg by mouth 2 (two) times daily.   Yes [provider]  propranolol (INNOPRAN XL) 80 MG 24 hr capsule TAKE ONE CAPSULE BY MOUTH EVERY DAY FOR MIGRAINE PROPHYLAXIS. 07/09/20  Yes [provider]  rivaroxaban (XARELTO) 20 MG TABS tablet Take 20 mg by mouth every morning.  Yes [provider]  rosuvastatin (CRESTOR) 20 MG tablet Take 20 mg by mouth daily.   Yes [provider]  topiramate (TOPAMAX) 50 MG tablet TAKE ONE TABLET BY MOUTH TWO TIMES A DAY START AFTER COMPLETING 25 MG TABLETS 08/04/20  Yes [provider]  traMADol (ULTRAM) 50 MG tablet Take 50 mg by mouth every 6 (six) hours as needed for moderate pain.   Yes [provider]  Atogepant 60 MG TABS Take by mouth. Patient not taking: Reported on 10/13/2020 07/03/20   [provider]    Allergies as of 10/13/2020 - Review Complete 10/13/2020  Allergen Reaction Noted  . Penicillins Shortness Of Breath 10/24/2011  . Amlodipine Swelling 10/24/2011  . Amlodipine besylate  Other (See Comments) 12/03/2019  . Atorvastatin Other (See Comments) 12/03/2019  . Eduard Roux  05/08/2019  . Lisinopril Other (See Comments) and Hypertension 05/08/2019  . Penicillin g Other (See Comments) 12/03/2019  . Simvastatin Other (See Comments) 12/03/2019    Family History  Problem Relation Age of Onset  . Hypertension Mother   . Hypertension Father   . Clotting disorder Brother     Social History   Socioeconomic History  . Marital status: Single    Spouse name: Not on file  . Number of children: Not on file  . Years of education: Not on file  . Highest education level: Not on file  Occupational History  . Occupation: Educator   Tobacco Use  . Smoking status: Former Smoker    Years: 4.00    Types: Pipe    Quit date: 05/24/1983    Years since quitting: 37.4  . Smokeless tobacco: Never Used  . Tobacco comment: smoked a pipe for 4 yrs- quit 1985  Vaping Use  . Vaping Use: Never used  Substance and Sexual Activity  . Alcohol use: No    Alcohol/week: 0.0 standard drinks  . Drug use: No  . Sexual activity: Not on file  Other Topics Concern  . Not on file  Social History Narrative  . Not on file   Social Determinants of Health   Financial Resource Strain: Not on file  Food Insecurity: Not on file  Transportation Needs: Not on file  Physical Activity: Not on file  Stress: Not on file  Social Connections: Not on file  Intimate Partner Violence: Not on file    Review of Systems: See HPI, otherwise negative ROS  Physical Exam: BP (!) 157/92   Pulse 60   Temp 97.8 F (36.6 C) (Temporal)   Resp 18   Ht 5' 10.51" (1.791 m)   Wt 113.4 kg   SpO2 95%   BMI 35.35 kg/m  General:   Alert,  pleasant and cooperative in NAD Head:  Normocephalic and atraumatic. Respiratory:  Normal work of breathing. Cardiovascular:  RRR  Impression/Plan: [name] Whitaker is here for cataract surgery.  Risks, benefits, limitations, and alternatives regarding cataract  surgery have been reviewed with the patient.  Questions have been answered.  All parties agreeable.   Birder Robson, MD  10/20/2020, 9:42 AM

## 2020-10-20 NOTE — Transfer of Care (Signed)
Immediate Anesthesia Transfer of Care Note  Patient: [name] Whitaker  Procedure(s) Performed: CATARACT EXTRACTION PHACO AND INTRAOCULAR LENS PLACEMENT (IOC) RIGHT DIABETIC (Right Eye)  Patient Location: PACU  Anesthesia Type: MAC  Level of Consciousness: awake, alert  and patient cooperative  Airway and Oxygen Therapy: Patient Spontanous Breathing and Patient connected to supplemental oxygen  Post-op Assessment: Post-op Vital signs reviewed, Patient's Cardiovascular Status Stable, Respiratory Function Stable, Patent Airway and No signs of Nausea or vomiting  Post-op Vital Signs: Reviewed and stable  Complications: No complications documented.

## 2020-10-20 NOTE — Anesthesia Preprocedure Evaluation (Signed)
Anesthesia Evaluation  Patient identified by MRN, date of birth, ID band Patient awake    Reviewed: Allergy & Precautions, H&P , NPO status , Patient's Chart, lab work & pertinent test results  Airway Mallampati: II  TM Distance: >3 FB Neck ROM: full    Dental no notable dental hx.    Pulmonary sleep apnea , former smoker,    Pulmonary exam normal        Cardiovascular Exercise Tolerance: Good hypertension, On Medications Normal cardiovascular exam Rhythm:regular Rate:Normal  Single cath several years ago, no interventions.   Neuro/Psych  Headaches,    GI/Hepatic Neg liver ROS, Medicated,  Endo/Other  Well Controlled, Type 2  Renal/GU      Musculoskeletal   Abdominal   Peds  Hematology negative hematology ROS (+)   Anesthesia Other Findings   Reproductive/Obstetrics negative OB ROS                             Anesthesia Physical Anesthesia Plan  ASA: II  Anesthesia Plan: MAC   Post-op Pain Management:    Induction:   PONV Risk Score and Plan: 1 and Treatment may vary due to age or medical condition  Airway Management Planned:   Additional Equipment:   Intra-op Plan:   Post-operative Plan:   Informed Consent: I have reviewed the patients History and Physical, chart, labs and discussed the procedure including the risks, benefits and alternatives for the proposed anesthesia with the patient or authorized representative who has indicated his/her understanding and acceptance.       Plan Discussed with:   Anesthesia Plan Comments:         Anesthesia Quick Evaluation

## 2020-10-20 NOTE — Anesthesia Postprocedure Evaluation (Signed)
Anesthesia Post Note  Patient: [name] Whitaker  Procedure(s) Performed: CATARACT EXTRACTION PHACO AND INTRAOCULAR LENS PLACEMENT (IOC) RIGHT DIABETIC (Right Eye)     Patient location during evaluation: PACU Anesthesia Type: MAC Level of consciousness: awake and alert Pain management: pain level controlled Vital Signs Assessment: post-procedure vital signs reviewed and stable Respiratory status: spontaneous breathing Cardiovascular status: stable Anesthetic complications: no   No complications documented.  Gillian Scarce

## 2020-10-20 NOTE — Op Note (Signed)
PREOPERATIVE DIAGNOSIS:  Nuclear sclerotic cataract of the right eye.   POSTOPERATIVE DIAGNOSIS:  Cataract   OPERATIVE PROCEDURE:@   SURGEON:  Birder Robson, MD.   ANESTHESIA:  Anesthesiologist: Elgie Collard, MD CRNA: Nyoka Cowden, CRNA  1.      Managed anesthesia care. 2.      0.14ml of Shugarcaine was instilled in the eye following the paracentesis.   COMPLICATIONS:  None.   TECHNIQUE:   Stop and chop   DESCRIPTION OF PROCEDURE:  The patient was examined and consented in the preoperative holding area where the aforementioned topical anesthesia was applied to the right eye and then brought back to the Operating Room where the right eye was prepped and draped in the usual sterile ophthalmic fashion and a lid speculum was placed. A paracentesis was created with the side port blade and the anterior chamber was filled with viscoelastic. A near clear corneal incision was performed with the steel keratome. A continuous curvilinear capsulorrhexis was performed with a cystotome followed by the capsulorrhexis forceps. Hydrodissection and hydrodelineation were carried out with BSS on a blunt cannula. The lens was removed in a stop and chop  technique and the remaining cortical material was removed with the irrigation-aspiration handpiece. The capsular bag was inflated with viscoelastic and the Technis [id number]  lens was placed in the capsular bag without complication. The remaining viscoelastic was removed from the eye with the irrigation-aspiration handpiece. The wounds were hydrated. The anterior chamber was flushed with BSS and the eye was inflated to physiologic pressure. 0.42ml of Vigamox was placed in the anterior chamber. The wounds were found to be water tight. The eye was dressed with Combigan. The patient was given protective glasses to wear throughout the day and a shield with which to sleep tonight. The patient was also given drops with which to begin a drop regimen today and will  follow-up with me in one day. Implant Name Type Inv. Item Serial No. Manufacturer Lot No. LRB No. Used Action  LENS IOL TECNIS EYHANCE 17.0 - [id number] Intraocular Lens LENS IOL TECNIS EYHANCE 17.0 [id number] JOHNSON   Right 1 Implanted   Procedure(s) with comments: CATARACT EXTRACTION PHACO AND INTRAOCULAR LENS PLACEMENT (IOC) RIGHT DIABETIC (Right) - 6.11 0:45.8  Electronically signed: Birder Robson 10/20/2020 10:12 AM

## 2020-10-22 ENCOUNTER — Encounter: Payer: Self-pay | Admitting: Ophthalmology

## 2020-11-03 ENCOUNTER — Other Ambulatory Visit: Payer: Self-pay

## 2020-11-03 ENCOUNTER — Encounter
Admission: RE | Disposition: A | Discharge status: Home / Self Care | Payer: Self-pay | Source: Home / Self Care | Attending: Ophthalmology

## 2020-11-03 ENCOUNTER — Ambulatory Visit: Payer: No Typology Code available for payment source | Admitting: Anesthesiology

## 2020-11-03 ENCOUNTER — Ambulatory Visit
Admission: RE | Admit: 2020-11-03 | Discharge: 2020-11-03 | Disposition: A | Discharge status: Home / Self Care | Payer: No Typology Code available for payment source | Attending: Ophthalmology | Admitting: Ophthalmology

## 2020-11-03 ENCOUNTER — Encounter: Payer: Self-pay | Admitting: Ophthalmology

## 2020-11-03 DIAGNOSIS — Z87891 Personal history of nicotine dependence: Secondary | ICD-10-CM | POA: Diagnosis not present

## 2020-11-03 DIAGNOSIS — E1136 Type 2 diabetes mellitus with diabetic cataract: Secondary | ICD-10-CM | POA: Diagnosis not present

## 2020-11-03 DIAGNOSIS — H2512 Age-related nuclear cataract, left eye: Secondary | ICD-10-CM | POA: Insufficient documentation

## 2020-11-03 HISTORY — PX: CATARACT EXTRACTION W/PHACO: SHX586

## 2020-11-03 LAB — GLUCOSE, CAPILLARY: Glucose-Capillary: 123 mg/dL — ABNORMAL HIGH (ref 70–99)

## 2020-11-03 SURGERY — PHACOEMULSIFICATION, CATARACT, WITH IOL INSERTION
Anesthesia: Monitor Anesthesia Care | Site: Eye | Laterality: Left

## 2020-11-03 MED ORDER — MOXIFLOXACIN HCL 0.5 % OP SOLN
OPHTHALMIC | Status: DC | PRN
  Administered 2020-11-03: 0.2 mL via OPHTHALMIC

## 2020-11-03 MED ORDER — FENTANYL CITRATE (PF) 100 MCG/2ML IJ SOLN
INTRAMUSCULAR | Status: DC | PRN
  Administered 2020-11-03: 50 ug via INTRAVENOUS

## 2020-11-03 MED ORDER — MIDAZOLAM HCL 2 MG/2ML IJ SOLN
INTRAMUSCULAR | Status: DC | PRN
  Administered 2020-11-03: 2 mg via INTRAVENOUS

## 2020-11-03 MED ORDER — LIDOCAINE HCL (PF) 2 % IJ SOLN
INTRAOCULAR | Status: DC | PRN
  Administered 2020-11-03: 1 mL via INTRAMUSCULAR

## 2020-11-03 MED ORDER — NA CHONDROIT SULF-NA HYALURON 40-17 MG/ML IO SOLN
INTRAOCULAR | Status: DC | PRN
  Administered 2020-11-03: 1 mL via INTRAOCULAR

## 2020-11-03 MED ORDER — ARMC OPHTHALMIC DILATING DROPS
1.0000 "application " | OPHTHALMIC | Status: DC | PRN
  Administered 2020-11-03 (×3): 1 via OPHTHALMIC

## 2020-11-03 MED ORDER — BRIMONIDINE TARTRATE-TIMOLOL 0.2-0.5 % OP SOLN
OPHTHALMIC | Status: DC | PRN
  Administered 2020-11-03: 1 [drp] via OPHTHALMIC

## 2020-11-03 MED ORDER — TETRACAINE HCL 0.5 % OP SOLN
1.0000 [drp] | OPHTHALMIC | Status: DC | PRN
  Administered 2020-11-03 (×3): 1 [drp] via OPHTHALMIC

## 2020-11-03 MED ORDER — LACTATED RINGERS IV SOLN: INTRAVENOUS | Status: DC

## 2020-11-03 MED ORDER — EPINEPHRINE PF 1 MG/ML IJ SOLN
INTRAOCULAR | Status: DC | PRN
  Administered 2020-11-03: 56 mL via OPHTHALMIC

## 2020-11-03 SURGICAL SUPPLY — 17 items
CANNULA ANT/CHMB 27GA (MISCELLANEOUS) ×4 IMPLANT
GLOVE SURG TRIUMPH 8.0 PF LTX (GLOVE) ×2 IMPLANT
GOWN STRL REUS W/ TWL LRG LVL3 (GOWN DISPOSABLE) ×2 IMPLANT
GOWN STRL REUS W/TWL LRG LVL3 (GOWN DISPOSABLE) ×4
LENS IOL TECNIS EYHANCE 18.0 (Intraocular Lens) ×2 IMPLANT
MARKER SKIN DUAL TIP RULER LAB (MISCELLANEOUS) ×2 IMPLANT
NEEDLE FILTER BLUNT 18X 1/2SAF (NEEDLE) ×1
NEEDLE FILTER BLUNT 18X1 1/2 (NEEDLE) ×1 IMPLANT
PACK EYE AFTER SURG (MISCELLANEOUS) ×2 IMPLANT
PACK OPTHALMIC (MISCELLANEOUS) ×2 IMPLANT
PACK PORFILIO (MISCELLANEOUS) ×2 IMPLANT
SUT ETHILON 10-0 CS-B-6CS-B-6 (SUTURE)
SUTURE EHLN 10-0 CS-B-6CS-B-6 (SUTURE) IMPLANT
SYR 3ML LL SCALE MARK (SYRINGE) ×2 IMPLANT
SYR TB 1ML LUER SLIP (SYRINGE) ×2 IMPLANT
WATER STERILE IRR 250ML POUR (IV SOLUTION) ×2 IMPLANT
WIPE NON LINTING 3.25X3.25 (MISCELLANEOUS) ×2 IMPLANT

## 2020-11-03 NOTE — Anesthesia Procedure Notes (Signed)
Procedure Name: MAC Date/Time: 11/03/2020 10:50 AM Performed by: Mayme Genta, CRNA Pre-anesthesia Checklist: Patient identified, Emergency Drugs available, Suction available, Timeout performed and Patient being monitored Patient Re-evaluated:Patient Re-evaluated prior to induction Oxygen Delivery Method: Nasal cannula Placement Confirmation: positive ETCO2

## 2020-11-03 NOTE — Transfer of Care (Signed)
Immediate Anesthesia Transfer of Care Note  Patient: [name] Whitaker  Procedure(s) Performed: CATARACT EXTRACTION PHACO AND INTRAOCULAR LENS PLACEMENT (IOC) LEFT DIABETIC (Left: Eye)  Patient Location: PACU  Anesthesia Type: MAC  Level of Consciousness: awake, alert  and patient cooperative  Airway and Oxygen Therapy: Patient Spontanous Breathing and Patient connected to supplemental oxygen  Post-op Assessment: Post-op Vital signs reviewed, Patient's Cardiovascular Status Stable, Respiratory Function Stable, Patent Airway and No signs of Nausea or vomiting  Post-op Vital Signs: Reviewed and stable  Complications: No notable events documented.

## 2020-11-03 NOTE — Anesthesia Postprocedure Evaluation (Signed)
Anesthesia Post Note  Patient: [name] Whitaker  Procedure(s) Performed: CATARACT EXTRACTION PHACO AND INTRAOCULAR LENS PLACEMENT (IOC) LEFT DIABETIC (Left: Eye)     Patient location during evaluation: PACU Anesthesia Type: MAC Level of consciousness: awake and alert Pain management: pain level controlled Vital Signs Assessment: post-procedure vital signs reviewed and stable Respiratory status: spontaneous breathing, nonlabored ventilation, respiratory function stable and patient connected to nasal cannula oxygen Cardiovascular status: stable and blood pressure returned to baseline Postop Assessment: no apparent nausea or vomiting Anesthetic complications: no   No notable events documented.  Alisa Graff

## 2020-11-03 NOTE — H&P (Signed)
[place]   Primary Care Physician:  [facility] Ophthalmologist: Dr.Mishell Donalson  Pre-Procedure History & Physical: HPI:  [name] Whitaker is a 70 y.o. male here for cataract surgery.   Past Medical History:  Diagnosis Date   Abnormal ECG 09/27/2013   Chest pain 09/27/2013   Dilated aortic root ([place]) 09/27/2013   DVT, lower extremity ([place]) 10/27/2011   Dyspnea 06/27/2014   2/5 /2016  Walked RA x 3 laps @ 185 ft each stopped due to  End of study, slow pace min sob  - PFTs 08/01/14  FEV1  2.89 (90%) ratio 81 and nl dlco     GERD (gastroesophageal reflux disease)    Gout    History of DVT (deep vein thrombosis) 04/14/2019   History of pulmonary embolism 04/14/2019   HTN (hypertension) 10/25/2011   Hypercholesteremia    Migraine    1-2x/wk   OSA (obstructive sleep apnea)    on CPAP    Pre-diabetes     Past Surgical History:  Procedure Laterality Date   APPENDECTOMY     BACK SURGERY     CARDIAC CATHETERIZATION     CATARACT EXTRACTION W/PHACO Right 10/20/2020   Procedure: CATARACT EXTRACTION PHACO AND INTRAOCULAR LENS PLACEMENT ([place]) RIGHT DIABETIC;  Surgeon: Birder Robson, MD;  Location: [facility];  Service: Ophthalmology;  Laterality: Right;  6.11 0:45.8   NASAL SEPTUM SURGERY     TESTICLE SURGERY     TONSILLECTOMY      Prior to Admission medications   Medication Sig Start Date End Date Taking? Authorizing Provider  acetaminophen (TYLENOL) 325 MG tablet Take 650 mg by mouth as needed.   Yes [provider]  allopurinol (ZYLOPRIM) 300 MG tablet Take 300 mg by mouth daily.   Yes [provider]  ascorbic acid (VITAMIN C) 500 MG tablet Take 1 tablet by mouth daily. 01/16/20  Yes [provider]  chlorthalidone (HYGROTON) 50 MG tablet Take 25 mg by mouth daily.   Yes [provider]  Cholecalciferol 25 MCG (1000 UT) tablet Take by mouth. 06/27/19  Yes [provider]  cyclobenzaprine (FLEXERIL) 10  MG tablet Take 10 mg by mouth as needed.    Yes [provider]  EPINEPHrine 0.3 mg/0.3 mL IJ SOAJ injection Inject 0.3 mg into the muscle as needed.   Yes [provider]  famotidine (PEPCID) 20 MG tablet One at bedtime 06/27/14  Yes Tanda Rockers, MD  finasteride (PROSCAR) 5 MG tablet Take 5 mg by mouth daily.   Yes [provider]  fluticasone (FLONASE) 50 MCG/ACT nasal spray Place into both nostrils daily.   Yes [provider]  gabapentin (NEURONTIN) 400 MG capsule Take 800 mg by mouth at bedtime.    Yes [provider]  glipiZIDE (GLUCOTROL) 5 MG tablet Take 5 mg by mouth daily before breakfast.   Yes [provider]  loratadine (CLARITIN) 10 MG tablet Take 10 mg by mouth daily.   Yes [provider]  losartan (COZAAR) 100 MG tablet Take 100 mg by mouth daily.   Yes [provider]  potassium chloride SA (K-DUR,KLOR-CON) 20 MEQ tablet Take 1 tab daily 09/06/13  Yes [provider]  propranolol (INNOPRAN XL) 80 MG 24 hr capsule TAKE ONE CAPSULE BY MOUTH EVERY DAY FOR MIGRAINE PROPHYLAXIS. 07/09/20  Yes [provider]  rivaroxaban (XARELTO) 20 MG TABS tablet Take 20 mg by mouth every morning.   Yes [provider]  rosuvastatin (  CRESTOR) 20 MG tablet Take 20 mg by mouth daily.   Yes [provider]  topiramate (TOPAMAX) 50 MG tablet TAKE ONE TABLET BY MOUTH TWO TIMES A DAY START AFTER COMPLETING 25 MG TABLETS 08/04/20  Yes [provider]  traMADol (ULTRAM) 50 MG tablet Take 50 mg by mouth every 6 (six) hours as needed for moderate pain.   Yes [provider]  Atogepant 60 MG TABS Take by mouth. Patient not taking: Reported on 10/13/2020 07/03/20   [provider]  ketoconazole (NIZORAL) 2 % cream Apply 1 application topically daily as needed for irritation (feet).  10/07/13   [provider]  propranolol (INDERAL) 60 MG tablet Take 60 mg by mouth 2 (two)  times daily. Patient not taking: Reported on 10/22/2020    [provider]    Allergies as of 10/21/2020 - Review Complete 10/20/2020  Allergen Reaction Noted   Penicillins Shortness Of Breath 10/24/2011   Amlodipine Swelling 10/24/2011   Amlodipine besylate Other (See Comments) 12/03/2019   Atorvastatin Other (See Comments) 12/03/2019   Eduard Roux  05/08/2019   Lisinopril Other (See Comments) and Hypertension 05/08/2019   Penicillin g Other (See Comments) 12/03/2019   Simvastatin Other (See Comments) 12/03/2019    Family History  Problem Relation Age of Onset   Hypertension Mother    Hypertension Father    Clotting disorder Brother     Social History   Socioeconomic History   Marital status: Single    Spouse name: Not on file   Number of children: Not on file   Years of education: Not on file   Highest education level: Not on file  Occupational History   Occupation: Educator   Tobacco Use   Smoking status: Former    Pack years: 0.00    Types: Pipe    Quit date: 05/24/1983    Years since quitting: 37.4   Smokeless tobacco: Never   Tobacco comments:    smoked a pipe for 4 yrs- quit 1985  Vaping Use   Vaping Use: Never used  Substance and Sexual Activity   Alcohol use: No    Alcohol/week: 0.0 standard drinks   Drug use: No   Sexual activity: Not on file  Other Topics Concern   Not on file  Social History Narrative   Not on file   Social Determinants of Health   Financial Resource Strain: Not on file  Food Insecurity: Not on file  Transportation Needs: Not on file  Physical Activity: Not on file  Stress: Not on file  Social Connections: Not on file  Intimate Partner Violence: Not on file    Review of Systems: See HPI, otherwise negative ROS  Physical Exam: BP (!) 140/96   Pulse (!) 54   Temp 97.8 F (36.6 C) (Temporal)   Ht 5' 10.5" (1.791 m)   Wt 112.9 kg   SpO2 100%   BMI 35.22 kg/m  General:   Alert,  pleasant and cooperative in  NAD Head:  Normocephalic and atraumatic. Respiratory:  Normal work of breathing. Cardiovascular:  RRR  Impression/Plan: [name] Whitaker is here for cataract surgery.  Risks, benefits, limitations, and alternatives regarding cataract surgery have been reviewed with the patient.  Questions have been answered.  All parties agreeable.   Birder Robson, MD  11/03/2020, 10:38 AM

## 2020-11-03 NOTE — Anesthesia Preprocedure Evaluation (Signed)
Anesthesia Evaluation  Patient identified by MRN, date of birth, ID band Patient awake    Reviewed: Allergy & Precautions, H&P , NPO status , Patient's Chart, lab work & pertinent test results, reviewed documented beta blocker date and time   Airway Mallampati: II  TM Distance: >3 FB Neck ROM: full    Dental no notable dental hx.    Pulmonary shortness of breath, sleep apnea and Continuous Positive Airway Pressure Ventilation , former smoker,    Pulmonary exam normal breath sounds clear to auscultation       Cardiovascular Exercise Tolerance: Good hypertension, + CAD and + DVT   Rhythm:regular Rate:Normal     Neuro/Psych  Headaches, negative psych ROS   GI/Hepatic Neg liver ROS, GERD  ,  Endo/Other  diabetes, Type 2  Renal/GU negative Renal ROS  negative genitourinary   Musculoskeletal   Abdominal   Peds  Hematology negative hematology ROS (+)   Anesthesia Other Findings   Reproductive/Obstetrics negative OB ROS                             Anesthesia Physical Anesthesia Plan  ASA: 3  Anesthesia Plan: MAC   Post-op Pain Management:    Induction:   PONV Risk Score and Plan: 1 and Treatment may vary due to age or medical condition  Airway Management Planned:   Additional Equipment:   Intra-op Plan:   Post-operative Plan:   Informed Consent: I have reviewed the patients History and Physical, chart, labs and discussed the procedure including the risks, benefits and alternatives for the proposed anesthesia with the patient or authorized representative who has indicated his/her understanding and acceptance.     Dental Advisory Given  Plan Discussed with: CRNA  Anesthesia Plan Comments:         Anesthesia Quick Evaluation

## 2020-11-03 NOTE — Op Note (Signed)
PREOPERATIVE DIAGNOSIS:  Nuclear sclerotic cataract of the left eye.   POSTOPERATIVE DIAGNOSIS:  Nuclear sclerotic cataract of the left eye.   OPERATIVE PROCEDURE:ORPROCALL@   SURGEON:  Birder Robson, MD.   ANESTHESIA:  Anesthesiologist: Alisa Graff, MD CRNA: Mayme Genta, CRNA  1.      Managed anesthesia care. 2.     0.45ml of Shugarcaine was instilled following the paracentesis   COMPLICATIONS:  None.   TECHNIQUE:   Stop and chop   DESCRIPTION OF PROCEDURE:  The patient was examined and consented in the preoperative holding area where the aforementioned topical anesthesia was applied to the left eye and then brought back to the Operating Room where the left eye was prepped and draped in the usual sterile ophthalmic fashion and a lid speculum was placed. A paracentesis was created with the side port blade and the anterior chamber was filled with viscoelastic. A near clear corneal incision was performed with the steel keratome. A continuous curvilinear capsulorrhexis was performed with a cystotome followed by the capsulorrhexis forceps. Hydrodissection and hydrodelineation were carried out with BSS on a blunt cannula. The lens was removed in a stop and chop  technique and the remaining cortical material was removed with the irrigation-aspiration handpiece. The capsular bag was inflated with viscoelastic and the Technis [id number] lens was placed in the capsular bag without complication. The remaining viscoelastic was removed from the eye with the irrigation-aspiration handpiece. The wounds were hydrated. The anterior chamber was flushed with BSS and the eye was inflated to physiologic pressure. 0.58ml Vigamox was placed in the anterior chamber. The wounds were found to be water tight. The eye was dressed with Combigan. The patient was given protective glasses to wear throughout the day and a shield with which to sleep tonight. The patient was also given drops with which to begin a drop regimen  today and will follow-up with me in one day. Implant Name Type Inv. Item Serial No. Manufacturer Lot No. LRB No. Used Action  LENS IOL TECNIS EYHANCE 18.0 - [id number] Intraocular Lens LENS IOL TECNIS EYHANCE 18.0 [id number] JOHNSON   Left 1 Implanted    Procedure(s) with comments: CATARACT EXTRACTION PHACO AND INTRAOCULAR LENS PLACEMENT ([facility]) LEFT DIABETIC (Left) - Diabetic - oral meds  Electronically signed: Birder Robson 11/03/2020 11:01 AM

## 2020-11-04 ENCOUNTER — Encounter: Payer: Self-pay | Admitting: Ophthalmology

## 2020-11-16 ENCOUNTER — Other Ambulatory Visit: Payer: Self-pay | Admitting: Podiatry

## 2020-11-16 DIAGNOSIS — Z0289 Encounter for other administrative examinations: Secondary | ICD-10-CM | POA: Insufficient documentation

## 2020-11-16 DIAGNOSIS — M76822 Posterior tibial tendinitis, left leg: Secondary | ICD-10-CM

## 2020-11-16 DIAGNOSIS — J302 Other seasonal allergic rhinitis: Secondary | ICD-10-CM | POA: Insufficient documentation

## 2020-11-16 DIAGNOSIS — S93422D Sprain of deltoid ligament of left ankle, subsequent encounter: Secondary | ICD-10-CM

## 2020-11-27 ENCOUNTER — Ambulatory Visit
Admission: RE | Admit: 2020-11-27 | Discharge: 2020-11-27 | Disposition: A | Discharge status: Home / Self Care | Payer: Medicare Other | Source: Ambulatory Visit | Attending: Podiatry | Admitting: Podiatry

## 2020-11-27 ENCOUNTER — Other Ambulatory Visit: Payer: Self-pay

## 2020-11-27 DIAGNOSIS — M76822 Posterior tibial tendinitis, left leg: Secondary | ICD-10-CM | POA: Diagnosis not present

## 2020-11-27 DIAGNOSIS — S86312A Strain of muscle(s) and tendon(s) of peroneal muscle group at lower leg level, left leg, initial encounter: Secondary | ICD-10-CM | POA: Diagnosis not present

## 2020-11-27 DIAGNOSIS — S93422D Sprain of deltoid ligament of left ankle, subsequent encounter: Secondary | ICD-10-CM | POA: Diagnosis not present

## 2020-12-02 DIAGNOSIS — E1142 Type 2 diabetes mellitus with diabetic polyneuropathy: Secondary | ICD-10-CM | POA: Diagnosis not present

## 2020-12-02 DIAGNOSIS — S93422D Sprain of deltoid ligament of left ankle, subsequent encounter: Secondary | ICD-10-CM | POA: Diagnosis not present

## 2020-12-02 DIAGNOSIS — M216X9 Other acquired deformities of unspecified foot: Secondary | ICD-10-CM | POA: Diagnosis not present

## 2020-12-02 DIAGNOSIS — G8929 Other chronic pain: Secondary | ICD-10-CM | POA: Diagnosis not present

## 2020-12-02 DIAGNOSIS — M545 Low back pain, unspecified: Secondary | ICD-10-CM | POA: Diagnosis not present

## 2020-12-02 DIAGNOSIS — M25572 Pain in left ankle and joints of left foot: Secondary | ICD-10-CM | POA: Diagnosis not present

## 2020-12-02 DIAGNOSIS — M19072 Primary osteoarthritis, left ankle and foot: Secondary | ICD-10-CM | POA: Diagnosis not present

## 2020-12-16 DIAGNOSIS — E1122 Type 2 diabetes mellitus with diabetic chronic kidney disease: Secondary | ICD-10-CM | POA: Diagnosis not present

## 2020-12-16 DIAGNOSIS — N183 Chronic kidney disease, stage 3 unspecified: Secondary | ICD-10-CM | POA: Diagnosis not present

## 2020-12-16 DIAGNOSIS — G43909 Migraine, unspecified, not intractable, without status migrainosus: Secondary | ICD-10-CM | POA: Diagnosis not present

## 2020-12-16 DIAGNOSIS — Z0000 Encounter for general adult medical examination without abnormal findings: Secondary | ICD-10-CM | POA: Diagnosis not present

## 2020-12-16 DIAGNOSIS — Z86711 Personal history of pulmonary embolism: Secondary | ICD-10-CM | POA: Diagnosis not present

## 2020-12-24 ENCOUNTER — Encounter (HOSPITAL_COMMUNITY): Payer: Self-pay | Admitting: Cardiology

## 2020-12-25 DIAGNOSIS — D352 Benign neoplasm of pituitary gland: Secondary | ICD-10-CM | POA: Diagnosis not present

## 2020-12-25 DIAGNOSIS — E221 Hyperprolactinemia: Secondary | ICD-10-CM | POA: Diagnosis not present

## 2020-12-25 DIAGNOSIS — E2749 Other adrenocortical insufficiency: Secondary | ICD-10-CM | POA: Diagnosis not present

## 2021-01-06 ENCOUNTER — Telehealth (HOSPITAL_COMMUNITY): Payer: Self-pay | Admitting: Cardiology

## 2021-01-06 NOTE — Telephone Encounter (Signed)
Just an FYI. We have made several attempts to contact this patient including sending a letter to schedule or reschedule their echocardiogram. We will be removing the patient from the echo [place].   12/24/20 MAILED LETTER [facility]  12/24/20 called and VM is full @ 9:46/[facility]  12/21/20 called and VM is full and unable to LVM @ 10:40/[facility] 01/06/21 Called and VM is full @ 10:37/[facility]  If patient calls to reschedule we will reinstate the order or created a new one. Thank you     Thank you

## 2021-01-06 NOTE — Telephone Encounter (Signed)
Sent to primary nurse as FYI 

## 2021-02-04 ENCOUNTER — Other Ambulatory Visit (HOSPITAL_COMMUNITY): Payer: Self-pay | Admitting: Neurosurgery

## 2021-02-04 ENCOUNTER — Other Ambulatory Visit: Payer: Self-pay | Admitting: Neurosurgery

## 2021-02-04 DIAGNOSIS — D497 Neoplasm of unspecified behavior of endocrine glands and other parts of nervous system: Secondary | ICD-10-CM

## 2021-02-11 DIAGNOSIS — D352 Benign neoplasm of pituitary gland: Secondary | ICD-10-CM | POA: Diagnosis not present

## 2021-02-14 ENCOUNTER — Ambulatory Visit: Payer: Medicare Other

## 2021-02-14 ENCOUNTER — Ambulatory Visit
Admission: RE | Admit: 2021-02-14 | Discharge: 2021-02-14 | Disposition: A | Discharge status: Home / Self Care | Payer: No Typology Code available for payment source | Source: Ambulatory Visit | Attending: Neurosurgery | Admitting: Neurosurgery

## 2021-02-14 ENCOUNTER — Other Ambulatory Visit: Payer: Self-pay

## 2021-02-14 DIAGNOSIS — D497 Neoplasm of unspecified behavior of endocrine glands and other parts of nervous system: Secondary | ICD-10-CM | POA: Diagnosis not present

## 2021-02-14 DIAGNOSIS — G319 Degenerative disease of nervous system, unspecified: Secondary | ICD-10-CM | POA: Diagnosis not present

## 2021-02-14 MED ORDER — GADOBUTROL 1 MMOL/ML IV SOLN
10.0000 mL | Freq: Once | INTRAVENOUS | Status: AC | PRN
  Administered 2021-02-14: 10 mL via INTRAVENOUS

## 2021-02-23 DIAGNOSIS — G43109 Migraine with aura, not intractable, without status migrainosus: Secondary | ICD-10-CM | POA: Diagnosis not present

## 2021-02-23 DIAGNOSIS — D352 Benign neoplasm of pituitary gland: Secondary | ICD-10-CM | POA: Diagnosis not present

## 2021-02-23 DIAGNOSIS — G4733 Obstructive sleep apnea (adult) (pediatric): Secondary | ICD-10-CM | POA: Diagnosis not present

## 2021-02-23 DIAGNOSIS — Z9989 Dependence on other enabling machines and devices: Secondary | ICD-10-CM | POA: Diagnosis not present

## 2021-02-25 ENCOUNTER — Encounter (HOSPITAL_BASED_OUTPATIENT_CLINIC_OR_DEPARTMENT_OTHER): Payer: Self-pay | Admitting: Cardiology

## 2021-02-25 ENCOUNTER — Other Ambulatory Visit: Payer: Self-pay

## 2021-02-25 ENCOUNTER — Ambulatory Visit (INDEPENDENT_AMBULATORY_CARE_PROVIDER_SITE_OTHER): Payer: Medicare Other | Admitting: Cardiology

## 2021-02-25 VITALS — BP 110/70 | HR 56 | Ht 70.5 in | Wt 269.0 lb

## 2021-02-25 DIAGNOSIS — R9431 Abnormal electrocardiogram [ECG] [EKG]: Secondary | ICD-10-CM

## 2021-02-25 DIAGNOSIS — I2510 Atherosclerotic heart disease of native coronary artery without angina pectoris: Secondary | ICD-10-CM | POA: Diagnosis not present

## 2021-02-25 DIAGNOSIS — I10 Essential (primary) hypertension: Secondary | ICD-10-CM | POA: Diagnosis not present

## 2021-02-25 DIAGNOSIS — R0789 Other chest pain: Secondary | ICD-10-CM | POA: Diagnosis not present

## 2021-02-25 DIAGNOSIS — E7800 Pure hypercholesterolemia, unspecified: Secondary | ICD-10-CM

## 2021-02-25 DIAGNOSIS — Z79899 Other long term (current) drug therapy: Secondary | ICD-10-CM

## 2021-02-25 DIAGNOSIS — Z7901 Long term (current) use of anticoagulants: Secondary | ICD-10-CM | POA: Diagnosis not present

## 2021-02-25 DIAGNOSIS — I77810 Thoracic aortic ectasia: Secondary | ICD-10-CM | POA: Diagnosis not present

## 2021-02-25 DIAGNOSIS — Z86711 Personal history of pulmonary embolism: Secondary | ICD-10-CM

## 2021-02-25 LAB — CBC
Hematocrit: 43.5 % (ref 37.5–51.0)
Hemoglobin: 13.9 g/dL (ref 13.0–17.7)
MCH: 28.1 pg (ref 26.6–33.0)
MCHC: 32 g/dL (ref 31.5–35.7)
MCV: 88 fL (ref 79–97)
Platelets: 196 10*3/uL (ref 150–450)
RBC: 4.94 x10E6/uL (ref 4.14–5.80)
RDW: 13.9 % (ref 11.6–15.4)
WBC: 7.3 10*3/uL (ref 3.4–10.8)

## 2021-02-25 LAB — COMPREHENSIVE METABOLIC PANEL
ALT: 15 IU/L (ref 0–44)
AST: 13 IU/L (ref 0–40)
Albumin/Globulin Ratio: 2.3 — ABNORMAL HIGH (ref 1.2–2.2)
Albumin: 4.4 g/dL (ref 3.8–4.8)
Alkaline Phosphatase: 64 IU/L (ref 44–121)
BUN/Creatinine Ratio: 9 — ABNORMAL LOW (ref 10–24)
BUN: 15 mg/dL (ref 8–27)
Bilirubin Total: 0.4 mg/dL (ref 0.0–1.2)
CO2: 24 mmol/L (ref 20–29)
Calcium: 8.9 mg/dL (ref 8.6–10.2)
Chloride: 102 mmol/L (ref 96–106)
Creatinine, Ser: 1.59 mg/dL — ABNORMAL HIGH (ref 0.76–1.27)
Globulin, Total: 1.9 g/dL (ref 1.5–4.5)
Glucose: 127 mg/dL — ABNORMAL HIGH (ref 70–99)
Potassium: 3.7 mmol/L (ref 3.5–5.2)
Sodium: 143 mmol/L (ref 134–144)
Total Protein: 6.3 g/dL (ref 6.0–8.5)
eGFR: 47 mL/min/{1.73_m2} — ABNORMAL LOW (ref >59)

## 2021-02-25 LAB — LIPID PANEL
Chol/HDL Ratio: 4.3 ratio (ref 0.0–5.0)
Cholesterol, Total: 132 mg/dL (ref 100–199)
HDL: 31 mg/dL — ABNORMAL LOW (ref >39)
LDL Chol Calc (NIH): 82 mg/dL (ref 0–99)
Triglycerides: 101 mg/dL (ref 0–149)
VLDL Cholesterol Cal: 19 mg/dL (ref 5–40)

## 2021-02-25 NOTE — Assessment & Plan Note (Signed)
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

## 2021-02-25 NOTE — Assessment & Plan Note (Signed)
Prior difficulty with statin intolerances.  He is doing actually quite well with the Crestor 20 mg daily.  We will go ahead and check a lipid panel, ALT.

## 2021-02-25 NOTE — Assessment & Plan Note (Signed)
44-46 mm on prior echocardiogram.  Going to repeat echocardiogram.

## 2021-02-25 NOTE — Assessment & Plan Note (Signed)
Prior work-ups for chest pain in the past have been reassuring.  Likely musculoskeletal/neuropathic etiology.  He has had prior several emergency room visits.  None recently.  Has seen pain specialty.  Overall doing well

## 2021-02-25 NOTE — Assessment & Plan Note (Signed)
Continuing Xarelto lifelong.  Checking lab work today.

## 2021-02-25 NOTE — Patient Instructions (Signed)
Medication Instructions:  The current medical regimen is effective;  continue present plan and medications.  *If you need a refill on your cardiac medications before your next appointment, please call your pharmacy*  Lab Work: Please have blood work today (Lipid, CBC, CMP) today on 3rd floor.  If you have labs (blood work) drawn today and your tests are completely normal, you will receive your results only by: [place] (if you have MyChart) OR A paper copy in the mail If you have any lab test that is abnormal or we need to change your treatment, we will call you to review the results.  Testing/Procedures: Your physician has requested that you have an echocardiogram. Echocardiography is a painless test that uses sound waves to create images of your heart. It provides your doctor with information about the size and shape of your heart and how well your heart's chambers and valves are working. This procedure takes approximately one hour. There are no restrictions for this procedure.  Follow-Up: At [facility], you and your health needs are our priority.  As part of our continuing mission to provide you with exceptional heart care, we have created designated Provider Care Teams.  These Care Teams include your primary Cardiologist (physician) and Advanced Practice Providers (APPs -  Physician Assistants and Nurse Practitioners) who all work together to provide you with the care you need, when you need it.  We recommend signing up for the patient portal called "MyChart".  Sign up information is provided on this After Visit Summary.  MyChart is used to connect with patients for Virtual Visits (Telemedicine).  Patients are able to view lab/test results, encounter notes, upcoming appointments, etc.  Non-urgent messages can be sent to your provider as well.   To learn more about what you can do with MyChart, go to [email or web address].    Your next appointment:   1 year(s)  The format for  your next appointment:   In Person  Provider:   Candee Furbish, MD   Thank you for choosing [facility]!!

## 2021-02-25 NOTE — Assessment & Plan Note (Signed)
EKG shows nonspecific T wave inversion at baseline.  This is unchanged from prior.  Prior cardiac catheterization in 2014 showed moderate CAD but no PCI.  Treated medically.

## 2021-02-25 NOTE — Progress Notes (Signed)
Cardiology Office Note:    Date:  02/25/2021   ID:  [name] Whitaker, DOB 30-Jul-1950, MRN [id number]  PCP:  [facility] [place] Cardiologist:  None  [facility] Electrophysiologist:  None   Referring MD: [facility]   History of Present Illness:    [name] is a 70 y.o. 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  Prior DVT, PE, moderate CAD 50% lesions with no PCI on cardiac catheterization at [facility] in 2014. 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  EKG has shown diffuse T wave inversion.  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  The steroids are causing his legs to swell and he has gained weight. Every now and then he feels short of breath. He continues to suffer from persistent headaches as well.  He denies any palpitations, or chest pain. No lightheadedness, syncope, orthopnea, or PND. Also has no exertional symptoms.  Past Medical History:  Diagnosis Date   Abnormal ECG 09/27/2013   Chest pain 09/27/2013   Dilated aortic root ([place]) 09/27/2013   DVT, lower extremity ([place]) 10/27/2011   Dyspnea 06/27/2014   2/5 /2016  Walked RA x 3 laps @ 185 ft each stopped due to  End of study, slow pace min sob  - PFTs 08/01/14  FEV1  2.89 (90%) ratio 81 and nl dlco     GERD (gastroesophageal reflux disease)    Gout    History of DVT (deep vein thrombosis) 04/14/2019   History of pulmonary embolism 04/14/2019   HTN (hypertension) 10/25/2011   Hypercholesteremia    Migraine    1-2x/wk   OSA (obstructive sleep apnea)    on CPAP    Pre-diabetes     Past Surgical History:  Procedure Laterality Date   APPENDECTOMY     BACK SURGERY     CARDIAC CATHETERIZATION     CATARACT EXTRACTION W/PHACO  Right 10/20/2020   Procedure: CATARACT EXTRACTION PHACO AND INTRAOCULAR LENS PLACEMENT ([place]) RIGHT DIABETIC;  Surgeon: Birder Robson, MD;  Location: [facility];  Service: Ophthalmology;  Laterality: Right;  6.11 0:45.8   CATARACT EXTRACTION W/PHACO Left 11/03/2020   Procedure: CATARACT EXTRACTION PHACO AND INTRAOCULAR LENS PLACEMENT ([place]) LEFT DIABETIC;  Surgeon: Birder Robson, MD;  Location: [facility];  Service: Ophthalmology;  Laterality: Left;  Diabetic - oral meds 3.55 00:26.2   NASAL SEPTUM SURGERY     TESTICLE SURGERY     TONSILLECTOMY      Current Medications: Current Meds  Medication Sig   acetaminophen (TYLENOL) 325 MG tablet Take 650 mg by mouth as needed.   allopurinol (ZYLOPRIM) 300 MG tablet Take 300 mg by mouth daily.   ascorbic acid (VITAMIN C) 500 MG tablet Take 1 tablet by mouth daily.   chlorthalidone (HYGROTON) 50 MG tablet Take 25 mg by mouth daily.   Cholecalciferol 25 MCG (1000 UT) tablet Take by mouth.   cyclobenzaprine (FLEXERIL) 10 MG tablet Take 10 mg by mouth as needed.    EPINEPHrine 0.3 mg/0.3 mL IJ SOAJ injection Inject 0.3 mg into the muscle as needed.   famotidine (PEPCID) 20 MG tablet One at bedtime   finasteride (PROSCAR) 5 MG tablet Take 5 mg by mouth daily.   fluticasone (FLONASE) 50 MCG/ACT nasal spray Place into both  nostrils daily.   gabapentin (NEURONTIN) 400 MG capsule Take 800 mg by mouth at bedtime.    glipiZIDE (GLUCOTROL) 5 MG tablet Take 5 mg by mouth daily before breakfast.   hydrocortisone (CORTEF) 5 MG tablet Take 5 mg by mouth 4 (four) times daily.   ketoconazole (NIZORAL) 2 % cream Apply 1 application topically daily as needed for irritation (feet).    loratadine (CLARITIN) 10 MG tablet Take 10 mg by mouth daily.   losartan (COZAAR) 100 MG tablet Take 100 mg by mouth daily.   potassium chloride SA (K-DUR,KLOR-CON) 20 MEQ tablet Take 1 tab daily   propranolol (INDERAL) 60 MG tablet Take 60 mg by mouth 2 (two)  times daily.   propranolol (INNOPRAN XL) 80 MG 24 hr capsule TAKE ONE CAPSULE BY MOUTH EVERY DAY FOR MIGRAINE PROPHYLAXIS.   rivaroxaban (XARELTO) 20 MG TABS tablet Take 20 mg by mouth every morning.   rosuvastatin (CRESTOR) 20 MG tablet Take 20 mg by mouth daily.   topiramate (TOPAMAX) 50 MG tablet TAKE ONE TABLET BY MOUTH TWO TIMES A DAY START AFTER COMPLETING 25 MG TABLETS   traMADol (ULTRAM) 50 MG tablet Take 50 mg by mouth every 6 (six) hours as needed for moderate pain.     Allergies:   Penicillins, Amlodipine, Amlodipine besylate, Atorvastatin, Erenumab-aooe, Lisinopril, Penicillin g, and Simvastatin   Social History   Socioeconomic History   Marital status: Single    Spouse name: Not on file   Number of children: Not on file   Years of education: Not on file   Highest education level: Not on file  Occupational History   Occupation: Educator   Tobacco Use   Smoking status: Former    Types: Pipe    Quit date: 05/24/1983    Years since quitting: 37.7   Smokeless tobacco: Never   Tobacco comments:    smoked a pipe for 4 yrs- quit 1985  Vaping Use   Vaping Use: Never used  Substance and Sexual Activity   Alcohol use: No    Alcohol/week: 0.0 standard drinks   Drug use: No   Sexual activity: Not on file  Other Topics Concern   Not on file  Social History Narrative   Not on file   Social Determinants of Health   Financial Resource Strain: Not on file  Food Insecurity: Not on file  Transportation Needs: Not on file  Physical Activity: Not on file  Stress: Not on file  Social Connections: Not on file     Family History: The patient's family history includes Clotting disorder in his brother; Hypertension in his father and mother.  ROS:   Please see the history of present illness.    (+) Bilateral LE edema (+) Weight gain (+) Shortness of breath (+) Headaches All other systems reviewed and are negative.  EKGs/Labs/Other Studies Reviewed:    The following  studies were reviewed today:  Echo 02/13/2020:  1. There is moderate dilatation of the ascending aorta, measuring 46 mm.   2. Left ventricular ejection fraction, by estimation, is 55 to 60%. The  left ventricle has normal function. The left ventricle has no regional  wall motion abnormalities. There is mild left ventricular hypertrophy.  Left ventricular diastolic parameters  are consistent with Grade I diastolic dysfunction (impaired relaxation).   3. Right ventricular systolic function is normal. The right ventricular  size is normal. Tricuspid regurgitation signal is inadequate for assessing  PA pressure.   4. The mitral valve is normal  in structure. No evidence of mitral valve  regurgitation. No evidence of mitral stenosis.   5. The aortic valve is tricuspid. Aortic valve regurgitation is trivial.  No aortic stenosis is present.   6. The inferior vena cava is normal in size with greater than 50%  respiratory variability, suggesting right atrial pressure of 3 mmHg.  Cardiac catheterization 07/14/2011-[facility]   -distal left main 10%, mid LAD 50%, circumflex nondominant, minor luminal irregularities. RCA normal. Left ventriculogram mildly depressed.   Right atrium 5, right ventricle 16/2, wedge 79mmHg.  EKG:  EKG is personally reviewed and interpreted. 02/25/2021: Sinus bradycardia. Rate 56 bpm. Nonspecific ST/T wave changes. 01/28/2020: sinus bradycardia rate 56 with left anterior fascicular block and diffuse T wave inversions.  Recent Labs: No results found for requested labs within last 8760 hours.   Recent Lipid Panel    Component Value Date/Time   CHOL 97 11/27/2013 0909   CHOL 99 11/18/2011 0356   TRIG 94.0 11/27/2013 0909   TRIG 73 11/18/2011 0356   HDL 28.70 (L) 11/27/2013 0909   HDL 23 (L) 11/18/2011 0356   CHOLHDL 3 11/27/2013 0909   VLDL 18.8 11/27/2013 0909   VLDL 15 11/18/2011 0356   LDLCALC 50 11/27/2013 0909   LDLCALC 61 11/18/2011 0356    Physical  Exam:    VS:  BP 110/70 (BP Location: Left Arm, Patient Position: Sitting, Cuff Size: Large)   Pulse (!) 56   Ht 5' 10.5" (1.791 m)   Wt 269 lb (122 kg)   SpO2 96%   BMI 38.05 kg/m     Wt Readings from Last 3 Encounters:  02/25/21 269 lb (122 kg)  11/03/20 249 lb (112.9 kg)  10/20/20 250 lb (113.4 kg)     GEN: Well nourished, well developed in no acute distress HEENT: Normal NECK: No JVD; No carotid bruits LYMPHATICS: No lymphadenopathy CARDIAC: RRR, no murmurs, rubs, gallops RESPIRATORY:  Clear to auscultation without rales, wheezing or rhonchi  ABDOMEN: Soft, non-tender, non-distended MUSCULOSKELETAL: Mild lower extremity edema; No deformity  SKIN: Warm and dry NEUROLOGIC:  Alert and oriented x 3 PSYCHIATRIC:  Normal affect   ASSESSMENT:    1. Dilated aortic root (HCC)   2. Medication management   3. Pure hypercholesterolemia   4. Essential hypertension   5. History of pulmonary embolism   6. Long term (current) use of anticoagulants   7. Abnormal ECG   8. Atherosclerosis of native coronary artery of native heart without angina pectoris   9. 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  Hyperlipidemia Prior difficulty with statin intolerances.  He is doing actually quite well with the Crestor 20 mg daily.  We will go ahead and check a lipid panel, ALT.  Long term (current) use of anticoagulants Continuing Xarelto lifelong.  Checking lab work today.  Abnormal ECG EKG shows nonspecific T wave inversion at baseline.  This is unchanged from prior.  Prior cardiac catheterization in 2014 showed moderate CAD but no PCI.  Treated medically.  Atherosclerotic heart disease of native coronary artery without angina pectoris Moderate CAD no PCI cardiac catheterization 2014.  Aggressive medical management.  Continue with current medical management.  Dilated  aortic root (HCC) 44-46 mm on prior echocardiogram.  Going to repeat echocardiogram.  Chest pain Prior work-ups for chest pain in the past have been reassuring.  Likely musculoskeletal/neuropathic etiology.  He has had prior several emergency room visits.  None recently.  Has seen pain specialty.  Overall doing well    Follow-up:  1 year  Medication Adjustments/Labs and Tests Ordered: Current medicines are reviewed at length with the patient today.  Concerns regarding medicines are outlined above.   Orders Placed This Encounter  Procedures   CBC   Lipid panel   Comprehensive metabolic panel   EKG 03-FVOH   ECHOCARDIOGRAM COMPLETE    No orders of the defined types were placed in this encounter.   Patient Instructions  Medication Instructions:  The current medical regimen is effective;  continue present plan and medications.  *If you need a refill on your cardiac medications before your next appointment, please call your pharmacy*  Lab Work: Please have blood work today (Lipid, CBC, CMP) today on 3rd floor.  If you have labs (blood work) drawn today and your tests are completely normal, you will receive your results only by: [place] (if you have MyChart) OR A paper copy in the mail If you have any lab test that is abnormal or we need to change your treatment, we will call you to review the results.  Testing/Procedures: Your physician has requested that you have an echocardiogram. Echocardiography is a painless test that uses sound waves to create images of your heart. It provides your doctor with information about the size and shape of your heart and how well your heart's chambers and valves are working. This procedure takes approximately one hour. There are no restrictions for this procedure.  Follow-Up: At [facility], you and your health needs are our priority.  As part of our continuing mission to provide you with exceptional heart care, we have created  designated Provider Care Teams.  These Care Teams include your primary Cardiologist (physician) and Advanced Practice Providers (APPs -  Physician Assistants and Nurse Practitioners) who all work together to provide you with the care you need, when you need it.  We recommend signing up for the patient portal called "MyChart".  Sign up information is provided on this After Visit Summary.  MyChart is used to connect with patients for Virtual Visits (Telemedicine).  Patients are able to view lab/test results, encounter notes, upcoming appointments, etc.  Non-urgent messages can be sent to your provider as well.   To learn more about what you can do with MyChart, go to [email or web address].    Your next appointment:   1 year(s)  The format for your next appointment:   In Person  Provider:   Candee Furbish, MD   Thank you for choosing [facility]!!     I,Mathew Stumpf,acting as a scribe for Candee Furbish, MD.,have documented all relevant documentation on the behalf of Candee Furbish, MD,as directed by  Candee Furbish, MD while in the presence of Candee Furbish, MD.  I, Candee Furbish, MD, have reviewed all documentation for this visit. The documentation on 02/25/21 for the exam, diagnosis, procedures, and orders are all accurate and complete.   Signed, Candee Furbish, MD  02/25/2021 10:44 AM    [place]

## 2021-02-25 NOTE — Assessment & Plan Note (Signed)
Moderate CAD no PCI cardiac catheterization 2014.  Aggressive medical management.  Continue with current medical management.

## 2021-02-26 ENCOUNTER — Telehealth: Payer: Self-pay | Admitting: Nurse Practitioner

## 2021-02-26 DIAGNOSIS — I2510 Atherosclerotic heart disease of native coronary artery without angina pectoris: Secondary | ICD-10-CM

## 2021-02-26 DIAGNOSIS — E7800 Pure hypercholesterolemia, unspecified: Secondary | ICD-10-CM

## 2021-02-26 MED ORDER — ROSUVASTATIN CALCIUM 20 MG PO TABS: 20.0000 mg | ORAL_TABLET | Freq: Every day | ORAL | 3 refills | Status: AC

## 2021-02-26 NOTE — Telephone Encounter (Signed)
-----   Message from Jerline Pain, MD sent at 02/26/2021  2:18 PM EDT ----- Stable labs. LDL 82. Would like to see <70 with known CAD.  Increase Crestor to 40mg  PO QD.  Repeat lipids in 3 months.  Candee Furbish, MD

## 2021-02-26 NOTE — Telephone Encounter (Signed)
Reviewed lab results and plan of care with patient who verbalized understanding. He states he has been taking Crestor 10 mg - he has been splitting his 20 mg tablet in half for as long as he can remember. I advised him to increase to 20 mg daily and we will repeat labs in 3 months. He is scheduled for lab appointment on 1/6 and agrees with plan of care. He thanked me for the call.

## 2021-03-02 DIAGNOSIS — I10 Essential (primary) hypertension: Secondary | ICD-10-CM | POA: Diagnosis not present

## 2021-03-02 DIAGNOSIS — D497 Neoplasm of unspecified behavior of endocrine glands and other parts of nervous system: Secondary | ICD-10-CM | POA: Diagnosis not present

## 2021-03-02 DIAGNOSIS — Z6835 Body mass index (BMI) 35.0-35.9, adult: Secondary | ICD-10-CM | POA: Insufficient documentation

## 2021-03-05 ENCOUNTER — Other Ambulatory Visit: Payer: Self-pay

## 2021-03-05 ENCOUNTER — Ambulatory Visit (INDEPENDENT_AMBULATORY_CARE_PROVIDER_SITE_OTHER): Payer: Medicare Other

## 2021-03-05 DIAGNOSIS — R0602 Shortness of breath: Secondary | ICD-10-CM | POA: Diagnosis not present

## 2021-03-05 DIAGNOSIS — I77810 Thoracic aortic ectasia: Secondary | ICD-10-CM

## 2021-03-05 LAB — ECHOCARDIOGRAM COMPLETE
AR max vel: 2.48 cm2
AV Area VTI: 2.57 cm2
AV Area mean vel: 2.73 cm2
AV Mean grad: 3 mmHg
AV Peak grad: 5.3 mmHg
Ao pk vel: 1.15 m/s
Area-P 1/2: 3.91 cm2
Calc EF: 57.3 %
P 1/2 time: 489 msec
S' Lateral: 3.48 cm
Single Plane A2C EF: 56.7 %
Single Plane A4C EF: 56.9 %

## 2021-03-11 ENCOUNTER — Telehealth: Payer: Self-pay | Admitting: *Deleted

## 2021-03-11 DIAGNOSIS — I77810 Thoracic aortic ectasia: Secondary | ICD-10-CM

## 2021-03-11 NOTE — Telephone Encounter (Signed)
Ejection fraction 50% low normal, there is moderate dilation of aorta ascending, 47 mm.  Previously 46 mm.  We will continue to monitor this closely.   Repeat echocardiogram in 1 year.   Pt aware of the above information.  Order placed for echo in 1 year.

## 2021-03-12 ENCOUNTER — Encounter (HOSPITAL_BASED_OUTPATIENT_CLINIC_OR_DEPARTMENT_OTHER): Payer: Self-pay | Admitting: Obstetrics and Gynecology

## 2021-03-12 ENCOUNTER — Emergency Department (HOSPITAL_BASED_OUTPATIENT_CLINIC_OR_DEPARTMENT_OTHER)
Admission: EM | Admit: 2021-03-12 | Discharge: 2021-03-12 | Disposition: A | Discharge status: Home / Self Care | Payer: No Typology Code available for payment source | Attending: Emergency Medicine | Admitting: Emergency Medicine

## 2021-03-12 ENCOUNTER — Emergency Department (HOSPITAL_BASED_OUTPATIENT_CLINIC_OR_DEPARTMENT_OTHER): Payer: No Typology Code available for payment source

## 2021-03-12 ENCOUNTER — Other Ambulatory Visit: Payer: Self-pay

## 2021-03-12 DIAGNOSIS — Z87891 Personal history of nicotine dependence: Secondary | ICD-10-CM | POA: Insufficient documentation

## 2021-03-12 DIAGNOSIS — N50811 Right testicular pain: Secondary | ICD-10-CM | POA: Diagnosis present

## 2021-03-12 DIAGNOSIS — I129 Hypertensive chronic kidney disease with stage 1 through stage 4 chronic kidney disease, or unspecified chronic kidney disease: Secondary | ICD-10-CM | POA: Insufficient documentation

## 2021-03-12 DIAGNOSIS — N183 Chronic kidney disease, stage 3 unspecified: Secondary | ICD-10-CM | POA: Insufficient documentation

## 2021-03-12 DIAGNOSIS — I861 Scrotal varices: Secondary | ICD-10-CM | POA: Insufficient documentation

## 2021-03-12 DIAGNOSIS — Z79899 Other long term (current) drug therapy: Secondary | ICD-10-CM | POA: Insufficient documentation

## 2021-03-12 DIAGNOSIS — E1122 Type 2 diabetes mellitus with diabetic chronic kidney disease: Secondary | ICD-10-CM | POA: Diagnosis not present

## 2021-03-12 DIAGNOSIS — N50819 Testicular pain, unspecified: Secondary | ICD-10-CM

## 2021-03-12 NOTE — ED Triage Notes (Signed)
Patient reports to the ER for swollen right testicle. Patient denies pain with urination. Pain began this morning after taking a shower, denies concern for STI. Denies feeling any masses or lumps to the region.

## 2021-03-12 NOTE — ED Provider Notes (Signed)
[facility] EMERGENCY DEPT Provider Note   CSN: [id number] Arrival date & time: 03/12/21  1711     History Chief Complaint  Patient presents with   Testicle Pain    [name] Whitaker is a 70 y.o. male with history of left undescended testis requiring subsequent orchiectomy who presents to the emergency department with right testicular pain that started earlier today.  He reports associated testicular swelling.  He denies fever, chills, urinary complaints.  He is not currently sexually active.  The history is provided by the patient. No language interpreter was used.  Testicle Pain      Past Medical History:  Diagnosis Date   Abnormal ECG 09/27/2013   Chest pain 09/27/2013   Dilated aortic root ([place]) 09/27/2013   DVT, lower extremity ([place]) 10/27/2011   Dyspnea 06/27/2014   2/5 /2016  Walked RA x 3 laps @ 185 ft each stopped due to  End of study, slow pace min sob  - PFTs 08/01/14  FEV1  2.89 (90%) ratio 81 and nl dlco     GERD (gastroesophageal reflux disease)    Gout    History of DVT (deep vein thrombosis) 04/14/2019   History of pulmonary embolism 04/14/2019   HTN (hypertension) 10/25/2011   Hypercholesteremia    Migraine    1-2x/wk   OSA (obstructive sleep apnea)    on CPAP    Pre-diabetes     Patient Active Problem List   Diagnosis Date Noted   Benign prostatic hyperplasia 07/20/2020   Steatosis of liver 07/20/2020   Atherosclerotic heart disease of native coronary artery without angina pectoris 05/28/2020   Chronic kidney disease with active medical management without dialysis, stage 3 (moderate) ([place]) 05/28/2020   Migraine 05/28/2020   Other seasonal allergic rhinitis 05/28/2020   Type 2 diabetes mellitus with other specified complication ([place]) 02/72/5366   Long term (current) use of anticoagulants 05/28/2020   Personal history of pulmonary embolism 05/28/2020   Essential hypertension 05/28/2020   Hypercholesterolemia 05/28/2020   Acute postoperative pain  05/28/2020   Other spondylosis, sacral and sacrococcygeal region 12/17/2019   Osteoarthritis of hips (Bilateral) 12/09/2019   Somatic dysfunction of sacroiliac joints (Bilateral) (R>L) 12/09/2019   Arthritis of hip (Right) 08/26/2019   Osteoarthritis of hip (Right) 04/30/2019   Abnormal MRI, lumbar spine (2014) 04/15/2019   Lumbar facet hypertrophy (Multilevel)  04/15/2019   Lumbar foraminal stenosis (L3-4, L4-5, L5-S1) 04/15/2019   Lumbar facet joint syndrome (Bilateral) (R>L) 04/15/2019   DDD (degenerative disc disease), lumbosacral 04/15/2019   Chronic hip pain (Bilateral) 04/15/2019   Chronic low back pain (1ry area of Pain) (Bilateral) (R>L) w/o sciatica 04/15/2019   Chronic sacroiliac joint pain (Bilateral) (R>L) 04/15/2019   Other intervertebral disc degeneration, lumbar region 04/15/2019   Chronic anticoagulation (Xarelto) 04/15/2019   History of DVT (deep vein thrombosis) 04/14/2019   History of pulmonary embolism 04/14/2019   Chronic pain syndrome 04/14/2019   Pharmacologic therapy 04/14/2019   Disorder of skeletal system 04/14/2019   Problems influencing health status 04/14/2019   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 07/10/2018   Spondylosis of lumbar region without myelopathy or radiculopathy 07/10/2018   Intractable migraine with aura without status migrainosus 06/29/2016   Obstructive sleep apnea (adult) (pediatric) 06/29/2016   Bilateral calf pain 08/02/2014   Dyspnea 06/27/2014   Chest pain 09/27/2013   Abnormal ECG 09/27/2013   Dilated aortic root ([place]) 09/27/2013   Pure hypercholesterolemia 09/27/2013   OSA on CPAP 10/27/2011   HTN (hypertension) 10/25/2011  Hyperlipidemia 10/25/2011   Gout 10/25/2011    Past Surgical History:  Procedure Laterality Date   APPENDECTOMY     BACK SURGERY     CARDIAC CATHETERIZATION     CATARACT EXTRACTION W/PHACO Right 10/20/2020   Procedure: CATARACT EXTRACTION PHACO AND INTRAOCULAR LENS PLACEMENT  ([place]) RIGHT DIABETIC;  Surgeon: Birder Robson, MD;  Location: [facility];  Service: Ophthalmology;  Laterality: Right;  6.11 0:45.8   CATARACT EXTRACTION W/PHACO Left 11/03/2020   Procedure: CATARACT EXTRACTION PHACO AND INTRAOCULAR LENS PLACEMENT ([place]) LEFT DIABETIC;  Surgeon: Birder Robson, MD;  Location: [facility];  Service: Ophthalmology;  Laterality: Left;  Diabetic - oral meds 3.55 00:26.2   NASAL SEPTUM SURGERY     TESTICLE SURGERY     TONSILLECTOMY         Family History  Problem Relation Age of Onset   Hypertension Mother    Hypertension Father    Clotting disorder Brother     Social History   Tobacco Use   Smoking status: Former    Types: Pipe    Quit date: 05/24/1983    Years since quitting: 37.8   Smokeless tobacco: Never   Tobacco comments:    smoked a pipe for 4 yrs- quit 1985  Vaping Use   Vaping Use: Never used  Substance Use Topics   Alcohol use: No    Alcohol/week: 0.0 standard drinks   Drug use: No    Home Medications Prior to Admission medications   Medication Sig Start Date End Date Taking? Authorizing Provider  acetaminophen (TYLENOL) 325 MG tablet Take 650 mg by mouth as needed.    [provider]  allopurinol (ZYLOPRIM) 300 MG tablet Take 300 mg by mouth daily.    [provider]  ascorbic acid (VITAMIN C) 500 MG tablet Take 1 tablet by mouth daily. 01/16/20   [provider]  chlorthalidone (HYGROTON) 50 MG tablet Take 25 mg by mouth daily.    [provider]  Cholecalciferol 25 MCG (1000 UT) tablet Take by mouth. 06/27/19   [provider]  cyclobenzaprine (FLEXERIL) 10 MG tablet Take 10 mg by mouth as needed.     [provider]  EPINEPHrine 0.3 mg/0.3 mL IJ SOAJ injection Inject 0.3 mg into the muscle as needed.    [provider]  famotidine (PEPCID) 20 MG tablet One at bedtime 06/27/14   Tanda Rockers, MD  finasteride (PROSCAR) 5 MG tablet Take 5 mg by  mouth daily.    [provider]  fluticasone (FLONASE) 50 MCG/ACT nasal spray Place into both nostrils daily.    [provider]  gabapentin (NEURONTIN) 400 MG capsule Take 800 mg by mouth at bedtime.     [provider]  glipiZIDE (GLUCOTROL) 5 MG tablet Take 5 mg by mouth daily before breakfast.    [provider]  hydrocortisone (CORTEF) 5 MG tablet Take 5 mg by mouth 4 (four) times daily.    [provider]  ketoconazole (NIZORAL) 2 % cream Apply 1 application topically daily as needed for irritation (feet).  10/07/13   [provider]  loratadine (CLARITIN) 10 MG tablet Take 10 mg by mouth daily.    [provider]  losartan (COZAAR) 100 MG tablet Take 100 mg by mouth daily.    [provider]  potassium chloride SA (K-DUR,KLOR-CON) 20 MEQ tablet Take 1 tab daily 09/06/13   [provider]  propranolol (INDERAL) 60 MG tablet Take 60 mg by  mouth 2 (two) times daily.    [provider]  propranolol (INNOPRAN XL) 80 MG 24 hr capsule TAKE ONE CAPSULE BY MOUTH EVERY DAY FOR MIGRAINE PROPHYLAXIS. 07/09/20   [provider]  rivaroxaban (XARELTO) 20 MG TABS tablet Take 20 mg by mouth every morning.    [provider]  rosuvastatin (CRESTOR) 20 MG tablet Take 1 tablet (20 mg total) by mouth daily. 02/26/21   Jerline Pain, MD  topiramate (TOPAMAX) 50 MG tablet TAKE ONE TABLET BY MOUTH TWO TIMES A DAY START AFTER COMPLETING 25 MG TABLETS 08/04/20   [provider]  traMADol (ULTRAM) 50 MG tablet Take 50 mg by mouth every 6 (six) hours as needed for moderate pain.    [provider]    Allergies    Penicillins, Amlodipine, Amlodipine besylate, Atorvastatin, Erenumab-aooe, Lisinopril, Penicillin g, and Simvastatin  Review of Systems   Review of Systems  Genitourinary:  Positive for testicular pain. Negative for dysuria, frequency, hematuria and urgency.  All other systems  reviewed and are negative.  Physical Exam Updated Vital Signs BP 138/89 (BP Location: Right Arm)   Pulse 66   Temp 98.2 F (36.8 C)   Resp 17   Ht 5\' 11"  (1.803 m)   Wt 122 kg   SpO2 97%   BMI 37.51 kg/m   Physical Exam Vitals and nursing note reviewed. Exam conducted with a chaperone present.  Constitutional:      Appearance: Normal appearance.  HENT:     Head: Normocephalic and atraumatic.  Eyes:     General:        Right eye: No discharge.        Left eye: No discharge.     Conjunctiva/sclera: Conjunctivae normal.  Pulmonary:     Effort: Pulmonary effort is normal.  Genitourinary:    Comments: Penis appears normal.  No evidence of rashes or lesions.  No paraphimosis or phimosis.  No obvious drainage from the penis at this time.  Left testicle is absent.  Right testicle is nontender and not swollen.  Normal lie.  There is increased swelling proximal to the testis which feels like a varicocele.  No obvious hernias. Skin:    General: Skin is warm and dry.     Findings: No rash.  Neurological:     General: No focal deficit present.     Mental Status: He is alert.  Psychiatric:        Mood and Affect: Mood normal.        Behavior: Behavior normal.    ED Results / Procedures / Treatments   Labs (all labs ordered are listed, but only abnormal results are displayed) Labs Reviewed - No data to display   EKG None  Radiology US SCROTUM W/DOPPLER  Result Date: 03/12/2021 CLINICAL DATA:  RIGHT testicular pain since this morning with swelling in a 70 year old male. 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 EXAM: SCROTAL ULTRASOUND DOPPLER ULTRASOUND OF THE TESTICLES TECHNIQUE: Complete ultrasound examination of the testicles, epididymis, and other scrotal structures was performed. Color and spectral Doppler ultrasound were also utilized to evaluate blood flow to the testicles. COMPARISON:  None FINDINGS: Right testicle Measurements: 4.6 x 2.3 x 3.2 cm.  No  suspicious testicular mass. Left testicle Surgically absent. Right epididymis: Enlarged and hyperemic epididymal tail. Cyst in the epididymal head. LEFT epididymis: Surgically absent Hydrocele:  Mildly complex RIGHT hydrocele, no visible septation. Varicocele:  Present on the RIGHT. Pulsed Doppler interrogation of the RIGHT  testis shows normal low resistance arterial and venous waveforms. IMPRESSION: RIGHT epididymitis with mildly complex hydrocele. Varicocele. Surgically absent LEFT testis. Electronically Signed   By: Zetta Bills M.D.   On: 03/12/2021 18:36    Procedures Procedures   Medications Ordered in ED Medications - No data to display  ED Course  I have reviewed the triage vital signs and the nursing notes.  Pertinent labs & imaging results that were available during my care of the patient were reviewed by me and considered in my medical decision making (see chart for details).    MDM Rules/Calculators/A&P                          [name] Whitaker is a 70 y.o. male who presents to the emergency department for for evaluation of right testicular pain.  History and physical exam is less concerning for torsion, epididymitis, scrotal abscess, testicular termor or trauma.  Ultrasound showed varicocele.  Given the clinical scenario he is safe for discharge.  I will have him follow-up with urology for further evaluation.  All questions and concerns addressed.   Final Clinical Impression(s) / ED Diagnoses Final diagnoses:  Varicocele    Rx / DC Orders ED Discharge Orders     None        Cherrie Gauze 03/12/21 Kathlene November, MD 03/12/21 [id number]

## 2021-03-12 NOTE — ED Notes (Signed)
Pt verbalizes understanding of discharge instructions. Opportunity for questioning and answers were provided. [place] removed by staff, pt discharged from ED to home. Educated to f/u with Urology.

## 2021-03-12 NOTE — Discharge Instructions (Addendum)
You were seen and evaluated in the emergency department today for for evaluation of testicular pain.  As we discussed, you have a varicocele which is just dilation of the veins in the scrotum and testicle.  Please use closer fitting underwear until you follow-up with urology.  Please return to the emergency department if you experience worsening pain, swelling, trouble urinating, burning with urination, or blood in your urine, or any other concerns you may have.

## 2021-04-27 DIAGNOSIS — D352 Benign neoplasm of pituitary gland: Secondary | ICD-10-CM | POA: Diagnosis not present

## 2021-04-27 DIAGNOSIS — E2749 Other adrenocortical insufficiency: Secondary | ICD-10-CM | POA: Diagnosis not present

## 2021-04-27 DIAGNOSIS — E221 Hyperprolactinemia: Secondary | ICD-10-CM | POA: Diagnosis not present

## 2021-04-28 DIAGNOSIS — E2749 Other adrenocortical insufficiency: Secondary | ICD-10-CM | POA: Diagnosis not present

## 2021-04-28 DIAGNOSIS — D352 Benign neoplasm of pituitary gland: Secondary | ICD-10-CM | POA: Diagnosis not present

## 2021-04-28 DIAGNOSIS — E221 Hyperprolactinemia: Secondary | ICD-10-CM | POA: Diagnosis not present

## 2021-05-12 DIAGNOSIS — E2749 Other adrenocortical insufficiency: Secondary | ICD-10-CM | POA: Diagnosis not present

## 2021-05-12 DIAGNOSIS — D352 Benign neoplasm of pituitary gland: Secondary | ICD-10-CM | POA: Diagnosis not present

## 2021-05-28 ENCOUNTER — Other Ambulatory Visit: Payer: Self-pay

## 2021-05-28 ENCOUNTER — Other Ambulatory Visit: Payer: Medicare Other | Admitting: *Deleted

## 2021-05-28 DIAGNOSIS — I2510 Atherosclerotic heart disease of native coronary artery without angina pectoris: Secondary | ICD-10-CM | POA: Diagnosis not present

## 2021-05-28 DIAGNOSIS — E7800 Pure hypercholesterolemia, unspecified: Secondary | ICD-10-CM | POA: Diagnosis not present

## 2021-05-28 LAB — LIPID PANEL
Chol/HDL Ratio: 4 ratio (ref 0.0–5.0)
Cholesterol, Total: 116 mg/dL (ref 100–199)
HDL: 29 mg/dL — ABNORMAL LOW (ref >39)
LDL Chol Calc (NIH): 69 mg/dL (ref 0–99)
Triglycerides: 93 mg/dL (ref 0–149)
VLDL Cholesterol Cal: 18 mg/dL (ref 5–40)

## 2021-05-28 LAB — ALT: ALT: 13 IU/L (ref 0–44)

## 2021-05-31 NOTE — Progress Notes (Signed)
PROVIDER NOTE: Information contained herein reflects review and annotations entered in association with encounter. Interpretation of such information and data should be left to medically-trained personnel. Information provided to patient can be located elsewhere in the medical record under "Patient Instructions". Document created using STT-dictation technology, any transcriptional errors that may result from process are unintentional.    Patient: [name] Whitaker  Service Category: E/M  Provider: Gaspar Cola, MD  DOB: [birth year]  DOS: 06/02/2021  Specialty: Interventional Pain Management  MRN: [id number]  Setting: Ambulatory outpatient  PCP: [facility] [place]  Type: Established Patient    Referring Provider: [facility]  Location: Office  Delivery: Face-to-face     HPI  [name] Whitaker, a 71 y.o. year old male, is here today because of his Chronic pain syndrome [G89.4]. [name]'s primary complain today is Back Pain (lower) Last encounter: My last encounter with him was on Visit date not found. Pertinent problems: 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 Pain Assessment: Severity of Chronic pain is reported as a 8 /10. Location: Back Right, Lower, Left/Denies. Onset: More than a month ago. Quality: Stabbing, Aching, Constant, Shooting. Timing: Constant. Modifying factor(s): procedures, resting. Vitals:  height is $RemoveB'5\' 11"'YXWpDhOT$  (1.803 m) and weight is 259 lb (117.5 kg). His temperature is 97.9 F (36.6 C). His blood pressure is 137/97 (abnormal) and his pulse is 70. His respiration is 15 and oxygen saturation is 98%.   Reason for encounter:  The patient is being evaluated today with a new referral from the [facility] hospital. The patient was last seen on 07/20/2020 as a follow-up from a right lumbar facet RFA # 1 done on 06/11/2020.  At the time, the patient had 100% relief of the pain that seem to be ongoing.  At this point, he has been approximately 1 year since he had his radiofrequency ablation done.  Today he returns indicating that the procedure gave him relief of the pain for approximately 1 year and he was extremely happy with that.  He also indicates that it allowed him to increase his range of motion and increase his level of activity.  He comes in today indicating that he wants to have it repeated.  Today his primary complaint is that of low back pain (Bilateral) (R>L).  He denies any lower extremity pain.  He refers that several weeks ago he had an episode of "sciatica" on the right side however, after further questioning what he describes is referred pain from the facet joints into the buttocks area that never even reached the level of the knee.  He says that it improved and currently he is not having that pain.  Exam is still positive for bilateral lumbar facet arthralgia however, he seems to be worse on the right side when compared to his initial  evaluation.  Based on the above, the plan is to bring him back and repeat bilateral lumbar facet radiofrequency ablation starting  with the right side.  The plan was shared with the patient who understood and accepted.  He was reminded that he needs to stop the Xarelto for 3 days prior to the procedure.  He has requested to have it done with sedation.  Pharmacotherapy Assessment  Analgesic: No opioid analgesics prescribed by our practice. Highest recorded MME/day: 26.67 mg/day MME/day: 0 mg/day   Monitoring: [place] PMP: PDMP reviewed during this encounter.       Pharmacotherapy: No side-effects or adverse reactions reported. Compliance: No problems identified. Effectiveness: Clinically acceptable.  Chauncey Fischer, RN  06/02/2021  8:36 AM  Sign when Signing Visit Safety precautions to be maintained throughout the outpatient stay will include: orient to surroundings, keep bed in low position, maintain call bell within reach at all times, provide assistance with transfer out of bed and ambulation.     UDS:  No results found for: SUMMARY   ROS  Constitutional: Denies any fever or chills Gastrointestinal: No reported hemesis, hematochezia, vomiting, or acute GI distress Musculoskeletal: Denies any acute onset joint swelling, redness, loss of ROM, or weakness Neurological: No reported episodes of acute onset apraxia, aphasia, dysarthria, agnosia, amnesia, paralysis, loss of coordination, or loss of consciousness  Medication Review  Cholecalciferol, EPINEPHrine, acetaminophen, allopurinol, ascorbic acid, chlorthalidone, cyclobenzaprine, famotidine, finasteride, fluticasone, gabapentin, glipiZIDE, hydrocortisone, ketoconazole, loratadine, losartan, potassium chloride SA, propranolol, rivaroxaban, rosuvastatin, topiramate, and traMADol  History Review  Allergy: [name] is allergic to penicillins, amlodipine, amlodipine besylate, atorvastatin, erenumab-aooe, lisinopril, penicillin g, and simvastatin. Drug: [name]  reports no history of drug use. Alcohol:  reports no history of alcohol use. Tobacco:  reports that he  quit smoking about 38 years ago. His smoking use included pipe. He has never used smokeless tobacco. Social: [name]  reports that he quit smoking about 38 years ago. His smoking use included pipe. He has never used smokeless tobacco. He reports that he does not drink alcohol and does not use drugs. Medical:  has a past medical history of Abnormal ECG (09/27/2013), Chest pain (09/27/2013), Dilated aortic root ([place]) (09/27/2013), DVT, lower extremity ([place]) (10/27/2011), Dyspnea (06/27/2014), GERD (gastroesophageal reflux disease), Gout, History of DVT (deep vein thrombosis) (04/14/2019), History of pulmonary embolism (04/14/2019), HTN (hypertension) (10/25/2011), Hypercholesteremia, Migraine, OSA (obstructive sleep apnea), and Pre-diabetes. Surgical: Mr. Zingale  has a past surgical history that includes Appendectomy; Nasal septum surgery; Tonsillectomy; Testicle surgery; Cardiac catheterization; Back surgery; Cataract extraction w/PHACO (Right, 10/20/2020); and Cataract extraction w/PHACO (Left, 11/03/2020). Family: family history includes Clotting disorder in his brother; Hypertension in his father and mother.  Laboratory Chemistry Profile   Renal Lab Results  Component Value Date   BUN 15 02/25/2021   CREATININE 1.59 (H) 02/25/2021   BCR 9 (L) 02/25/2021   GFRAA 62 04/15/2019   GFRNONAA 54 (L) 04/15/2019    Hepatic Lab Results  Component Value Date   AST 13 02/25/2021   ALT 13 05/28/2021   ALBUMIN 4.4 02/25/2021   ALKPHOS 64 02/25/2021   LIPASE 19 03/15/2007    Electrolytes Lab Results  Component Value Date   NA 143 02/25/2021   K 3.7 02/25/2021   CL 102 02/25/2021   CALCIUM 8.9 02/25/2021   MG 2.2 04/15/2019    Bone Lab Results  Component Value Date   25OHVITD1 43 04/15/2019   25OHVITD2 <1.0 04/15/2019   25OHVITD3 43 04/15/2019  Inflammation (CRP: Acute Phase) (ESR: Chronic Phase) Lab Results  Component Value Date   CRP 2 04/15/2019   ESRSEDRATE 6 04/15/2019         Note:  Above Lab results reviewed.  Recent Imaging Review  US SCROTUM W/DOPPLER CLINICAL DATA:  RIGHT testicular pain since this morning with swelling in a 71 year old male. 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  EXAM: SCROTAL ULTRASOUND  DOPPLER ULTRASOUND OF THE TESTICLES  TECHNIQUE: Complete ultrasound examination of the testicles, epididymis, and other scrotal structures was performed. Color and spectral Doppler ultrasound were also utilized to evaluate blood flow to the testicles.  COMPARISON:  None  FINDINGS: Right testicle  Measurements: 4.6 x 2.3 x 3.2 cm.  No suspicious testicular mass.  Left testicle  Surgically absent.  Right epididymis: Enlarged and hyperemic epididymal tail. Cyst in the epididymal head.  LEFT epididymis: Surgically absent  Hydrocele:  Mildly complex RIGHT hydrocele, no visible septation.  Varicocele:  Present on the RIGHT.  Pulsed Doppler interrogation of the RIGHT testis shows normal low resistance arterial and venous waveforms.  IMPRESSION: RIGHT epididymitis with mildly complex hydrocele.  Varicocele.  Surgically absent LEFT testis.  Electronically Signed   By: Zetta Bills M.D.   On: 03/12/2021 18:36 Note: Reviewed        Physical Exam  General appearance: Well nourished, well developed, and well hydrated. In no apparent acute distress Mental status: Alert, oriented x 3 (person, place, & time)       Respiratory: No evidence of acute respiratory distress Eyes: PERLA Vitals: BP (!) 137/97    Pulse 70    Temp 97.9 F (36.6 C)    Resp 15    Ht _0  (1.803 m)    Wt 259 lb (117.5 kg)    SpO2 98%    BMI 36.12 kg/m  BMI: Estimated body mass index is 36.12 kg/m as calculated from the following:   Height as of this encounter: _1  (1.803 m).   Weight as of this encounter: 259 lb (117.5 kg). Ideal: Ideal body weight: 75.3 kg (166 lb 0.1 oz) Adjusted ideal body weight: 92.2 kg (203 lb 3.3 oz)  Assessment    Status Diagnosis  Controlled Controlled Controlled 1. Chronic pain syndrome   2. Chronic low back pain (1ry area of Pain) (Bilateral) (R>L) w/o sciatica   3. Chronic hip pain (Bilateral)   4. Lumbar facet joint syndrome (Bilateral) (R>L)   5. DDD (degenerative disc disease), lumbosacral   6. Chronic anticoagulation (Xarelto)      Updated Problems: No problems updated.   Plan of Care  Problem-specific:  No problem-specific Assessment & Plan notes found for this encounter.  Mr. Ladarious Kresse has a current medication list which includes the following long-term medication(s): allopurinol, chlorthalidone, famotidine, glipizide, loratadine, potassium chloride sa, propranolol, propranolol, rivaroxaban, rosuvastatin, topiramate, and losartan.  Pharmacotherapy (Medications Ordered): No orders of the defined types were placed in this encounter.  Orders:  Orders Placed This Encounter  Procedures   Radiofrequency,Lumbar    Standing Status:   Future    Standing Expiration Date:   08/30/2021    Scheduling Instructions:     Side(s): Bilateral, starting with the right-side     Level: L3-4, L4-5, & L5-S1 Facets (L2, L3, L4, L5, & S1 Medial Branch Nerves)     Sedation: Patient's choice.     Scheduling Timeframe: As soon as pre-approved    Order Specific Question:   Where will this procedure be performed?  Answer:   [facility]   Blood Thinner Instructions to Nursing    Always make sure patient has clearance from prescribing physician to stop blood thinners for interventional therapies. If the patient requires a Lovenox-bridge therapy, make sure arrangements are made to institute it with the assistance of the PCP.    Scheduling Instructions:     Have Mr. Antunes stop the Xarelto (Rivaroxaban) x 3 days prior to procedure or surgery.   Follow-up plan:   Return for (62mn), (ECT) procedure: (R) L-FCT RFA #2, (Sed-anx), (Blood Thinner Protocol).     Interventional Therapies   Risk   Complexity Considerations:   NOTE: Xarelto Anticoagulation (Stop: 3 days   Restart: 6 hours)   Planned   Pending:   Therapeutic bilateral lumbar facet medial branch RFA, starting with the right side.   Under consideration:   Possible right opturator + femoral NB  Possible right opturator + femoral nerve RFA  Possible right SI joint RFA  Diagnostic right lumbar facet block  Possible right lumbar facet RFA (the patient indicates having had approximately 5 prior RFA)   Completed:   Therapeutic right IA hip joint injection x2 (100/100/50) Diagnostic bilateral SI joint block x2 (50/50/100 x 5 wks/0)  Therapeutic left lumbar facet RFA #1 (05/28/2020) (100% ongoing 6 weeks after procedure)  Therapeutic right lumbar facet RFA #1 (06/11/2020) (100% ongoing 6 weeks after procedure)    Palliative options:   Palliative right IA hip joint injection #2  (last done 04/30/2019) Palliative bilateral SI joint block #2     Recent Visits No visits were found meeting these conditions. Showing recent visits within past 90 days and meeting all other requirements Today's Visits Date Type Provider Dept  06/02/21 Office Visit NMilinda Pointer MD [facility]  Showing today's visits and meeting all other requirements Future Appointments No visits were found meeting these conditions. Showing future appointments within next 90 days and meeting all other requirements  I discussed the assessment and treatment plan with the patient. The patient was provided an opportunity to ask questions and all were answered. The patient agreed with the plan and demonstrated an understanding of the instructions.  Patient advised to call back or seek an in-person evaluation if the symptoms or condition worsens.  Duration of encounter: 30 minutes.  Note by: FGaspar Cola MD Date: 06/02/2021; Time: 9:13 AM

## 2021-06-01 DIAGNOSIS — Z23 Encounter for immunization: Secondary | ICD-10-CM | POA: Insufficient documentation

## 2021-06-01 DIAGNOSIS — G43109 Migraine with aura, not intractable, without status migrainosus: Secondary | ICD-10-CM | POA: Insufficient documentation

## 2021-06-01 DIAGNOSIS — E1121 Type 2 diabetes mellitus with diabetic nephropathy: Secondary | ICD-10-CM | POA: Insufficient documentation

## 2021-06-01 DIAGNOSIS — M19072 Primary osteoarthritis, left ankle and foot: Secondary | ICD-10-CM | POA: Insufficient documentation

## 2021-06-02 ENCOUNTER — Encounter: Payer: Self-pay | Admitting: Pain Medicine

## 2021-06-02 ENCOUNTER — Ambulatory Visit: Payer: No Typology Code available for payment source | Attending: Pain Medicine | Admitting: Pain Medicine

## 2021-06-02 ENCOUNTER — Other Ambulatory Visit: Payer: Self-pay

## 2021-06-02 VITALS — BP 137/97 | HR 70 | Temp 97.9°F | Resp 15 | Ht 71.0 in | Wt 259.0 lb

## 2021-06-02 DIAGNOSIS — Z7901 Long term (current) use of anticoagulants: Secondary | ICD-10-CM | POA: Diagnosis present

## 2021-06-02 DIAGNOSIS — M47816 Spondylosis without myelopathy or radiculopathy, lumbar region: Secondary | ICD-10-CM | POA: Diagnosis not present

## 2021-06-02 DIAGNOSIS — M25552 Pain in left hip: Secondary | ICD-10-CM | POA: Diagnosis present

## 2021-06-02 DIAGNOSIS — G894 Chronic pain syndrome: Secondary | ICD-10-CM | POA: Insufficient documentation

## 2021-06-02 DIAGNOSIS — M25551 Pain in right hip: Secondary | ICD-10-CM | POA: Diagnosis not present

## 2021-06-02 DIAGNOSIS — M545 Low back pain, unspecified: Secondary | ICD-10-CM | POA: Insufficient documentation

## 2021-06-02 DIAGNOSIS — G8929 Other chronic pain: Secondary | ICD-10-CM | POA: Insufficient documentation

## 2021-06-02 DIAGNOSIS — M5137 Other intervertebral disc degeneration, lumbosacral region: Secondary | ICD-10-CM | POA: Insufficient documentation

## 2021-06-02 NOTE — Progress Notes (Signed)
Safety precautions to be maintained throughout the outpatient stay will include: orient to surroundings, keep bed in low position, maintain call bell within reach at all times, provide assistance with transfer out of bed and ambulation.  

## 2021-06-02 NOTE — Patient Instructions (Signed)
______________________________________________________________________ ° °Preparing for Procedure with Sedation ° °NOTICE: Due to recent regulatory changes, starting on December 21, 2020, procedures requiring intravenous (IV) sedation will no longer be performed at the [facility].  These types of procedures are required to be performed at [facility] ambulatory surgery facility.  We are very sorry for the inconvenience. ° °Procedure appointments are limited to planned procedures: °No Prescription Refills. °No disability issues will be discussed. °No medication changes will be discussed. ° °Instructions: °Oral Intake: Do not eat or drink anything for at least 8 hours prior to your procedure. (Exception: Blood Pressure Medication. See below.) °Transportation: A driver is required. You may not drive yourself after the procedure. °Blood Pressure Medicine: Do not forget to take your blood pressure medicine with a sip of water the morning of the procedure. If your Diastolic (lower reading) is above 100 mmHg, elective cases will be cancelled/rescheduled. °Blood thinners: These will need to be stopped for procedures. Notify our staff if you are taking any blood thinners. Depending on which one you take, there will be specific instructions on how and when to stop it. °Diabetics on insulin: Notify the staff so that you can be scheduled 1st case in the morning. If your diabetes requires high dose insulin, take only ½ of your normal insulin dose the morning of the procedure and notify the staff that you have done so. °Preventing infections: Shower with an antibacterial soap the morning of your procedure. °Build-up your immune system: Take 1000 mg of Vitamin C with every meal (3 times a day) the day prior to your procedure. °Antibiotics: Inform the staff if you have a condition or reason that requires you to take antibiotics before dental procedures. °Pregnancy: If you are pregnant, call and cancel the procedure. °Sickness: If  you have a cold, fever, or any active infections, call and cancel the procedure. °Arrival: You must be in the facility at least 30 minutes prior to your scheduled procedure. °Children: Do not bring children with you. °Dress appropriately: Bring dark clothing that you would not mind if they get stained. °Valuables: Do not bring any jewelry or valuables. ° °Reasons to call and reschedule or cancel your procedure: (Following these recommendations will minimize the risk of a serious complication.) °Surgeries: Avoid having procedures within 2 weeks of any surgery. (Avoid for 2 weeks before or after any surgery). °Flu Shots: Avoid having procedures within 2 weeks of a flu shots. (Avoid for 2 weeks before or after immunizations). °Barium: Avoid having a procedure within 7-10 days after having had a radiological study involving the use of radiological contrast. (Myelograms, Barium swallow or enema study). °Heart attacks: Avoid any elective procedures or surgeries for the initial 6 months after a "Myocardial Infarction" (Heart Attack). °Blood thinners: It is imperative that you stop these medications before procedures. Let us know if you if you take any blood thinner.  °Infection: Avoid procedures during or within two weeks of an infection (including chest colds or gastrointestinal problems). Symptoms associated with infections include: Localized redness, fever, chills, night sweats or profuse sweating, burning sensation when voiding, cough, congestion, stuffiness, runny nose, sore throat, diarrhea, nausea, vomiting, cold or Flu symptoms, recent or current infections. It is specially important if the infection is over the area that we intend to treat. °Heart and lung problems: Symptoms that may suggest an active cardiopulmonary problem include: cough, chest pain, breathing difficulties or shortness of breath, dizziness, ankle swelling, uncontrolled high or unusually low blood pressure, and/or palpitations. If you are    experiencing any of these symptoms, cancel your procedure and contact your primary care physician for an evaluation. ° °Remember:  °Regular Business hours are:  °Monday to Thursday 8:00 AM to 4:00 PM ° °Provider's Schedule: °Mabell Esguerra, MD:  °Procedure days: Tuesday and Thursday 7:30 AM to 4:00 PM ° °Bilal Lateef, MD:  °Procedure days: Monday and Wednesday 7:30 AM to 4:00 PM °______________________________________________________________________ ° ____________________________________________________________________________________________ ° °General Risks and Possible Complications ° °Patient Responsibilities: It is important that you read this as it is part of your informed consent. It is our duty to inform you of the risks and possible complications associated with treatments offered to you. It is your responsibility as a patient to read this and to ask questions about anything that is not clear or that you believe was not covered in this document. ° °Patient’s Rights: You have the right to refuse treatment. You also have the right to change your mind, even after initially having agreed to have the treatment done. However, under this last option, if you wait until the last second to change your mind, you may be charged for the materials used up to that point. ° °Introduction: Medicine is not an exact science. Everything in Medicine, including the lack of treatment(s), carries the potential for danger, harm, or loss (which is by definition: Risk). In Medicine, a complication is a secondary problem, condition, or disease that can aggravate an already existing one. All treatments carry the risk of possible complications. The fact that a side effects or complications occurs, does not imply that the treatment was conducted incorrectly. It must be clearly understood that these can happen even when everything is done following the highest safety standards. ° °No treatment: You can choose not to proceed with the  proposed treatment alternative. The “PRO(s)” would include: avoiding the risk of complications associated with the therapy. The “CON(s)” would include: not getting any of the treatment benefits. These benefits fall under one of three categories: diagnostic; therapeutic; and/or palliative. Diagnostic benefits include: getting information which can ultimately lead to improvement of the disease or symptom(s). Therapeutic benefits are those associated with the successful treatment of the disease. Finally, palliative benefits are those related to the decrease of the primary symptoms, without necessarily curing the condition (example: decreasing the pain from a flare-up of a chronic condition, such as incurable terminal cancer). ° °General Risks and Complications: These are associated to most interventional treatments. They can occur alone, or in combination. They fall under one of the following six (6) categories: no benefit or worsening of symptoms; bleeding; infection; nerve damage; allergic reactions; and/or death. °No benefits or worsening of symptoms: In Medicine there are no guarantees, only probabilities. No healthcare provider can ever guarantee that a medical treatment will work, they can only state the probability that it may. Furthermore, there is always the possibility that the condition may worsen, either directly, or indirectly, as a consequence of the treatment. °Bleeding: This is more common if the patient is taking a blood thinner, either prescription or over the counter (example: Goody Powders, Fish oil, Aspirin, Garlic, etc.), or if suffering a condition associated with impaired coagulation (example: Hemophilia, cirrhosis of the liver, low platelet counts, etc.). However, even if you do not have one on these, it can still happen. If you have any of these conditions, or take one of these drugs, make sure to notify your treating physician. °Infection: This is more common in patients with a compromised  immune system, either due to disease (example:   diabetes, cancer, human immunodeficiency virus [HIV], etc.), or due to medications or treatments (example: therapies used to treat cancer and rheumatological diseases). However, even if you do not have one on these, it can still happen. If you have any of these conditions, or take one of these drugs, make sure to notify your treating physician. Nerve Damage: This is more common when the treatment is an invasive one, but it can also happen with the use of medications, such as those used in the treatment of cancer. The damage can occur to small secondary nerves, or to large primary ones, such as those in the spinal cord and brain. This damage may be temporary or permanent and it may lead to impairments that can range from temporary numbness to permanent paralysis and/or brain death. Allergic Reactions: Any time a substance or material comes in contact with our body, there is the possibility of an allergic reaction. These can range from a mild skin rash (contact dermatitis) to a severe systemic reaction (anaphylactic reaction), which can result in death. Death: In general, any medical intervention can result in death, most of the time due to an unforeseen complication. ____________________________________________________________________________________________ ____________________________________________________________________________________________  Blood Thinners  IMPORTANT NOTICE:  If you take any of these, make sure to notify the nursing staff.  Failure to do so may result in injury.  Recommended time intervals to stop and restart blood-thinners, before & after invasive procedures  Generic Name Brand Name Pre-procedure. Stop this long before procedure. Post-procedure. Minimum waiting period before restarting.  Abciximab Reopro 15 days 2 hrs  Alteplase Activase 10 days 10 days  Anagrelide Agrylin    Apixaban Eliquis 3 days 6 hrs  Cilostazol Pletal  3 days 5 hrs  Clopidogrel Plavix 7-10 days 2 hrs  Dabigatran Pradaxa 5 days 6 hrs  Dalteparin Fragmin 24 hours 4 hrs  Dipyridamole Aggrenox 11days 2 hrs  Edoxaban Lixiana; Savaysa 3 days 2 hrs  Enoxaparin  Lovenox 24 hours 4 hrs  Eptifibatide Integrillin 8 hours 2 hrs  Fondaparinux  Arixtra 72 hours 12 hrs  Hydroxychloroquine Plaquenil 11 days   Prasugrel Effient 7-10 days 6 hrs  Reteplase Retavase 10 days 10 days  Rivaroxaban Xarelto 3 days 6 hrs  Ticagrelor Brilinta 5-7 days 6 hrs  Ticlopidine Ticlid 10-14 days 2 hrs  Tinzaparin Innohep 24 hours 4 hrs  Tirofiban Aggrastat 8 hours 2 hrs  Warfarin Coumadin 5 days 2 hrs   Other medications with blood-thinning effects  Product indications Generic (Brand) names Note  Cholesterol Lipitor Stop 4 days before procedure  Blood thinner (injectable) Heparin (LMW or LMWH Heparin) Stop 24 hours before procedure  Cancer Ibrutinib (Imbruvica) Stop 7 days before procedure  Malaria/Rheumatoid Hydroxychloroquine (Plaquenil) Stop 11 days before procedure  Thrombolytics  10 days before or after procedures   Over-the-counter (OTC) Products with blood-thinning effects  Product Common names Stop Time  Aspirin > 325 mg Goody Powders, Excedrin, etc. 11 days  Aspirin ? 81 mg  7 days  Fish oil  4 days  Garlic supplements  7 days  Ginkgo biloba  36 hours  Ginseng  24 hours  NSAIDs Ibuprofen, Naprosyn, etc. 3 days  Vitamin E  4 days   ____________________________________________________________________________________________  

## 2021-06-04 ENCOUNTER — Telehealth: Payer: Self-pay

## 2021-06-04 NOTE — Telephone Encounter (Signed)
[facility] has questions about [facility] stopping blood th[facility] for the RFA and they also have questions about the procedure itself. Please call [phone] extension [phone]

## 2021-06-07 NOTE — Telephone Encounter (Signed)
Called and left message with the blood coagulation clinic.

## 2021-06-11 DIAGNOSIS — M5481 Occipital neuralgia: Secondary | ICD-10-CM | POA: Diagnosis not present

## 2021-06-11 DIAGNOSIS — G43109 Migraine with aura, not intractable, without status migrainosus: Secondary | ICD-10-CM | POA: Diagnosis not present

## 2021-06-11 DIAGNOSIS — Z9989 Dependence on other enabling machines and devices: Secondary | ICD-10-CM | POA: Diagnosis not present

## 2021-06-11 DIAGNOSIS — G4733 Obstructive sleep apnea (adult) (pediatric): Secondary | ICD-10-CM | POA: Diagnosis not present

## 2021-06-11 DIAGNOSIS — D352 Benign neoplasm of pituitary gland: Secondary | ICD-10-CM | POA: Diagnosis not present

## 2021-06-15 ENCOUNTER — Ambulatory Visit (HOSPITAL_BASED_OUTPATIENT_CLINIC_OR_DEPARTMENT_OTHER): Payer: No Typology Code available for payment source | Admitting: Pain Medicine

## 2021-06-15 ENCOUNTER — Ambulatory Visit
Admission: RE | Admit: 2021-06-15 | Discharge: 2021-06-15 | Disposition: A | Discharge status: Home / Self Care | Payer: No Typology Code available for payment source | Source: Ambulatory Visit | Attending: Pain Medicine | Admitting: Pain Medicine

## 2021-06-15 ENCOUNTER — Encounter: Payer: Self-pay | Admitting: Pain Medicine

## 2021-06-15 ENCOUNTER — Other Ambulatory Visit: Payer: Self-pay

## 2021-06-15 VITALS — BP 128/85 | HR 63 | Temp 97.3°F | Resp 16 | Ht 70.5 in | Wt 252.0 lb

## 2021-06-15 DIAGNOSIS — M5137 Other intervertebral disc degeneration, lumbosacral region: Secondary | ICD-10-CM

## 2021-06-15 DIAGNOSIS — M4316 Spondylolisthesis, lumbar region: Secondary | ICD-10-CM | POA: Diagnosis not present

## 2021-06-15 DIAGNOSIS — M545 Low back pain, unspecified: Secondary | ICD-10-CM | POA: Insufficient documentation

## 2021-06-15 DIAGNOSIS — G8929 Other chronic pain: Secondary | ICD-10-CM | POA: Insufficient documentation

## 2021-06-15 DIAGNOSIS — M47816 Spondylosis without myelopathy or radiculopathy, lumbar region: Secondary | ICD-10-CM | POA: Insufficient documentation

## 2021-06-15 DIAGNOSIS — Z7901 Long term (current) use of anticoagulants: Secondary | ICD-10-CM

## 2021-06-15 DIAGNOSIS — G8918 Other acute postprocedural pain: Secondary | ICD-10-CM

## 2021-06-15 MED ORDER — HYDROCODONE-ACETAMINOPHEN 5-325 MG PO TABS: 1.0000 | ORAL_TABLET | Freq: Three times a day (TID) | ORAL | 0 refills | Status: DC | PRN

## 2021-06-15 MED ORDER — LACTATED RINGERS IV SOLN
1000.0000 mL | Freq: Once | INTRAVENOUS | Status: AC
Start: 2021-06-15 — End: 2021-06-15
  Administered 2021-06-15: 10:00:00 1000 mL via INTRAVENOUS

## 2021-06-15 MED ORDER — LIDOCAINE HCL (PF) 2 % IJ SOLN
INTRAMUSCULAR | Status: AC
  Filled 2021-06-15: qty 15

## 2021-06-15 MED ORDER — TRIAMCINOLONE ACETONIDE 40 MG/ML IJ SUSP
INTRAMUSCULAR | Status: AC
  Filled 2021-06-15: qty 1

## 2021-06-15 MED ORDER — FENTANYL CITRATE (PF) 100 MCG/2ML IJ SOLN
25.0000 ug | INTRAMUSCULAR | Status: DC | PRN
  Administered 2021-06-15: 11:00:00 75 ug via INTRAVENOUS

## 2021-06-15 MED ORDER — LIDOCAINE HCL 2 % IJ SOLN
20.0000 mL | Freq: Once | INTRAMUSCULAR | Status: AC
  Administered 2021-06-15: 10:00:00 100 mg

## 2021-06-15 MED ORDER — TRIAMCINOLONE ACETONIDE 40 MG/ML IJ SUSP
40.0000 mg | Freq: Once | INTRAMUSCULAR | Status: AC
  Administered 2021-06-15: 10:00:00 40 mg

## 2021-06-15 MED ORDER — ROPIVACAINE HCL 2 MG/ML IJ SOLN
9.0000 mL | Freq: Once | INTRAMUSCULAR | Status: AC
Start: 2021-06-15 — End: 2021-06-15
  Administered 2021-06-15: 10:00:00 9 mL via PERINEURAL

## 2021-06-15 MED ORDER — ROPIVACAINE HCL 2 MG/ML IJ SOLN
INTRAMUSCULAR | Status: AC
  Filled 2021-06-15: qty 20

## 2021-06-15 MED ORDER — HYDROCODONE-ACETAMINOPHEN 5-325 MG PO TABS: 1.0000 | ORAL_TABLET | Freq: Three times a day (TID) | ORAL | 0 refills | Status: AC | PRN

## 2021-06-15 MED ORDER — MIDAZOLAM HCL 5 MG/5ML IJ SOLN
INTRAMUSCULAR | Status: AC
  Filled 2021-06-15: qty 5

## 2021-06-15 MED ORDER — FENTANYL CITRATE (PF) 100 MCG/2ML IJ SOLN
INTRAMUSCULAR | Status: AC
  Filled 2021-06-15: qty 2

## 2021-06-15 MED ORDER — MIDAZOLAM HCL 5 MG/5ML IJ SOLN
0.5000 mg | Freq: Once | INTRAMUSCULAR | Status: AC
  Administered 2021-06-15: 11:00:00 2 mg via INTRAVENOUS

## 2021-06-15 NOTE — Progress Notes (Signed)
PROVIDER NOTE: Interpretation of information contained herein should be left to medically-trained personnel. Specific patient instructions are provided elsewhere under "Patient Instructions" section of medical record. This document was created in part using STT-dictation technology, any transcriptional errors that may result from this process are unintentional.  Patient: [name] Whitaker Type: Established DOB: 1951/04/17 MRN: [id number] PCP: [facility] [place]  Service: Procedure DOS: 06/15/2021 Setting: Ambulatory Location: Ambulatory outpatient facility Delivery: Face-to-face Provider: Gaspar Cola, MD Specialty: Interventional Pain Management Specialty designation: 09 Location: Outpatient facility Ref. Prov.: [facility], [facility]    Primary Reason for Visit: Interventional Pain Management Treatment. CC: Back Pain  Procedure:           Type: Lumbar Facet, Medial Branch Radiofrequency Ablation (RFA) #2  Laterality: Right  Level: L2, L3, L4, L5, & S1 Medial Branch Level(s). These levels will denervate the L3-4, L4-5 and L5-S1 lumbar facet joints.  Imaging: Fluoroscopic guidance Anesthesia: Local anesthesia (1-2% Lidocaine) Anxiolysis: IV Versed         Sedation: Moderate conscious sedation. DOS: 06/15/2021  Performed by: Gaspar Cola, MD  Purpose: Therapeutic/Palliative Indications: Low back pain severe enough to impact quality of life or function. Indications: 1. Lumbar facet joint syndrome (Bilateral) (R>L)   2. Spondylosis of lumbar region without myelopathy or radiculopathy   3. DDD (degenerative disc disease), lumbosacral   4. Chronic low back pain (1ry area of Pain) (Bilateral) (R>L) w/o sciatica   5. Grade 1 Anterolisthesis (71mm) of lumbar spine of L4/L5   6. Lumbar facet hypertrophy (Multilevel)     Chronic anticoagulation (Xarelto)    Mr. Frampton has been dealing with the above chronic pain for longer than three months and has  either failed to respond, was unable to tolerate, or simply did not get enough benefit from other more conservative therapies including, but not limited to: 1. Over-the-counter medications 2. Anti-inflammatory medications 3. Muscle relaxants 4. Membrane stabilizers 5. Opioids 6. Physical therapy and/or chiropractic manipulation 7. Modalities (Heat, ice, etc.) 8. Invasive techniques such as nerve blocks. Mr. Broad has attained more than 50% relief of the pain from a series of diagnostic injections conducted in separate occasions.  Pain Score: Pre-procedure: 9 /10 Post-procedure: 0-No pain/10     Position / Prep / Materials:  Position: Prone  Prep solution: DuraPrep (Iodine Povacrylex [0.7% available iodine] and Isopropyl Alcohol, 74% w/w) Prep Area: Entire Lumbosacral Region (Lower back from mid-thoracic region to end of tailbone and from flank to flank.) Materials:  Tray: RFA (Radiofrequency) tray Needle(s):  Type: RFA (Teflon-coated radiofrequency ablation needles) Gauge (G): 22  Length: Regular (10cm) Qty: 5  Pre-op H&P Assessment:  Mr. Torregrossa is a 71 y.o. (year old), male patient, seen today for interventional treatment. He  has a past surgical history that includes Appendectomy; Nasal septum surgery; Tonsillectomy; Testicle surgery; Cardiac catheterization; Back surgery; Cataract extraction w/PHACO (Right, 10/20/2020); and Cataract extraction w/PHACO (Left, 11/03/2020). Mr. Shillingford has a current medication list which includes the following prescription(s): acetaminophen, allopurinol, ascorbic acid, chlorthalidone, cholecalciferol, cyclobenzaprine, epinephrine, famotidine, finasteride, fluticasone, gabapentin, glipizide, hydrocodone-acetaminophen, [START ON 06/22/2021] hydrocodone-acetaminophen, hydrocortisone, ketoconazole, loratadine, losartan, potassium chloride sa, propranolol, propranolol, rivaroxaban, rosuvastatin, topiramate, and tramadol, and the following  Facility-Administered Medications: fentanyl. His primarily concern today is the Back Pain  Initial Vital Signs:  Pulse/HCG Rate: 63ECG Heart Rate: 67 Temp: (!) 97.3 F (36.3 C) Resp: 18 BP: 127/84 SpO2: 100 %  BMI: Estimated body mass index is 35.65 kg/m as calculated from the following:  Height as of this encounter: 5' 10.5" (1.791 m).   Weight as of this encounter: 252 lb (114.3 kg).  Risk Assessment: Allergies: Reviewed. He is allergic to penicillins, amlodipine, amlodipine besylate, atorvastatin, erenumab-aooe, lisinopril, penicillin g, and simvastatin.  Allergy Precautions: None required Coagulopathies: Reviewed. None identified.  Blood-thinner therapy: None at this time Active Infection(s): Reviewed. None identified. Mr. Sweetin is afebrile  Site Confirmation: Mr. Qin was asked to confirm the procedure and laterality before marking the site Procedure checklist: Completed Consent: Before the procedure and under the influence of no sedative(s), amnesic(s), or anxiolytics, the patient was informed of the treatment options, risks and possible complications. To fulfill our ethical and legal obligations, as recommended by the American Medical Association's Code of Ethics, I have informed the patient of my clinical impression; the nature and purpose of the treatment or procedure; the risks, benefits, and possible complications of the intervention; the alternatives, including doing nothing; the risk(s) and benefit(s) of the alternative treatment(s) or procedure(s); and the risk(s) and benefit(s) of doing nothing. The patient was provided information about the general risks and possible complications associated with the procedure. These may include, but are not limited to: failure to achieve desired goals, infection, bleeding, organ or nerve damage, allergic reactions, paralysis, and death. In addition, the patient was informed of those risks and complications associated to  Spine-related procedures, such as failure to decrease pain; infection (i.e.: Meningitis, epidural or intraspinal abscess); bleeding (i.e.: epidural hematoma, subarachnoid hemorrhage, or any other type of intraspinal or peri-dural bleeding); organ or nerve damage (i.e.: Any type of peripheral nerve, nerve root, or spinal cord injury) with subsequent damage to sensory, motor, and/or autonomic systems, resulting in permanent pain, numbness, and/or weakness of one or several areas of the body; allergic reactions; (i.e.: anaphylactic reaction); and/or death. Furthermore, the patient was informed of those risks and complications associated with the medications. These include, but are not limited to: allergic reactions (i.e.: anaphylactic or anaphylactoid reaction(s)); adrenal axis suppression; blood sugar elevation that in diabetics may result in ketoacidosis or comma; water retention that in patients with history of congestive heart failure may result in shortness of breath, pulmonary edema, and decompensation with resultant heart failure; weight gain; swelling or edema; medication-induced neural toxicity; particulate matter embolism and blood vessel occlusion with resultant organ, and/or nervous system infarction; and/or aseptic necrosis of one or more joints. Finally, the patient was informed that Medicine is not an exact science; therefore, there is also the possibility of unforeseen or unpredictable risks and/or possible complications that may result in a catastrophic outcome. The patient indicated having understood very clearly. We have given the patient no guarantees and we have made no promises. Enough time was given to the patient to ask questions, all of which were answered to the patient's satisfaction. Mr. Mandato has indicated that he wanted to continue with the procedure. Attestation: I, the ordering provider, attest that I have discussed with the patient the benefits, risks, side-effects, alternatives,  likelihood of achieving goals, and potential problems during recovery for the procedure that I have provided informed consent. Date   Time: 06/15/2021  9:12 AM  Pre-Procedure Preparation:  Monitoring: As per clinic protocol. Respiration, ETCO2, SpO2, BP, heart rate and rhythm monitor placed and checked for adequate function Safety Precautions: Patient was assessed for positional comfort and pressure points before starting the procedure. Time-out: I initiated and conducted the "Time-out" before starting the procedure, as per protocol. The patient was asked to participate by confirming the accuracy of  the "Time Out" information. Verification of the correct person, site, and procedure were performed and confirmed by me, the nursing staff, and the patient. "Time-out" conducted as per Joint Commission's Universal Protocol (UP.01.01.01). Time: 0955  Description of Procedure:          Laterality: Right Levels:  L2, L3, L4, L5, & S1 Medial Branch Level(s), at the L3-4, L4-5 and L5-S1 lumbar facet joints. Safety Precautions: Aspiration looking for blood return was conducted prior to all injections. At no point did we inject any substances, as a needle was being advanced. Before injecting, the patient was told to immediately notify me if he was experiencing any new onset of "ringing in the ears, or metallic taste in the mouth". No attempts were made at seeking any paresthesias. Safe injection practices and needle disposal techniques used. Medications properly checked for expiration dates. SDV (single dose vial) medications used. After the completion of the procedure, all disposable equipment used was discarded in the proper designated medical waste containers. Local Anesthesia: Protocol guidelines were followed. The patient was positioned over the fluoroscopy table. The area was prepped in the usual manner. The time-out was completed. The target area was identified using fluoroscopy. A 12-in long, straight,  sterile hemostat was used with fluoroscopic guidance to locate the targets for each level blocked. Once located, the skin was marked with an approved surgical skin marker. Once all sites were marked, the skin (epidermis, dermis, and hypodermis), as well as deeper tissues (fat, connective tissue and muscle) were infiltrated with a small amount of a short-acting local anesthetic, loaded on a 10cc syringe with a 25G, 1.5-in  Needle. An appropriate amount of time was allowed for local anesthetics to take effect before proceeding to the next step. Technical description of process:  Radiofrequency Ablation (RFA) L2 Medial Branch Nerve RFA: The target area for the L2 medial branch is at the junction of the postero-lateral aspect of the superior articular process and the superior, posterior, and medial edge of the transverse process of L3. Under fluoroscopic guidance, a Radiofrequency needle was inserted until contact was made with os over the superior postero-lateral aspect of the pedicular shadow (target area). Sensory and motor testing was conducted to properly adjust the position of the needle. Once satisfactory placement of the needle was achieved, the numbing solution was slowly injected after negative aspiration for blood. 2.0 mL of the nerve block solution was injected without difficulty or complication. After waiting for at least 3 minutes, the ablation was performed. Once completed, the needle was removed intact. L3 Medial Branch Nerve RFA: The target area for the L3 medial branch is at the junction of the postero-lateral aspect of the superior articular process and the superior, posterior, and medial edge of the transverse process of L4. Under fluoroscopic guidance, a Radiofrequency needle was inserted until contact was made with os over the superior postero-lateral aspect of the pedicular shadow (target area). Sensory and motor testing was conducted to properly adjust the position of the needle. Once  satisfactory placement of the needle was achieved, the numbing solution was slowly injected after negative aspiration for blood. 2.0 mL of the nerve block solution was injected without difficulty or complication. After waiting for at least 3 minutes, the ablation was performed. Once completed, the needle was removed intact. L4 Medial Branch Nerve RFA: The target area for the L4 medial branch is at the junction of the postero-lateral aspect of the superior articular process and the superior, posterior, and medial edge of the transverse  process of L5. Under fluoroscopic guidance, a Radiofrequency needle was inserted until contact was made with os over the superior postero-lateral aspect of the pedicular shadow (target area). Sensory and motor testing was conducted to properly adjust the position of the needle. Once satisfactory placement of the needle was achieved, the numbing solution was slowly injected after negative aspiration for blood. 2.0 mL of the nerve block solution was injected without difficulty or complication. After waiting for at least 3 minutes, the ablation was performed. Once completed, the needle was removed intact. L5 Medial Branch Nerve RFA: The target area for the L5 medial branch is at the junction of the postero-lateral aspect of the superior articular process of S1 and the superior, posterior, and medial edge of the sacral ala. Under fluoroscopic guidance, a Radiofrequency needle was inserted until contact was made with os over the superior postero-lateral aspect of the pedicular shadow (target area). Sensory and motor testing was conducted to properly adjust the position of the needle. Once satisfactory placement of the needle was achieved, the numbing solution was slowly injected after negative aspiration for blood. 2.0 mL of the nerve block solution was injected without difficulty or complication. After waiting for at least 3 minutes, the ablation was performed. Once completed, the needle  was removed intact. S1 Medial Branch Nerve RFA: The target area for the S1 medial branch is located inferior to the junction of the S1 superior articular process and the L5 inferior articular process, posterior, inferior, and lateral to the 6 o'clock position of the L5-S1 facet joint, just superior to the S1 posterior foramen. Under fluoroscopic guidance, the Radiofrequency needle was advanced until contact was made with os over the Target area. Sensory and motor testing was conducted to properly adjust the position of the needle. Once satisfactory placement of the needle was achieved, the numbing solution was slowly injected after negative aspiration for blood. 2.0 mL of the nerve block solution was injected without difficulty or complication. After waiting for at least 3 minutes, the ablation was performed. Once completed, the needle was removed intact. Radiofrequency lesioning (ablation):  Radiofrequency Generator: Medtronic AccurianTM AG 1000 RF Generator Sensory Stimulation Parameters: 50 Hz was used to locate & identify the nerve, making sure that the needle was positioned such that there was no sensory stimulation below 0.3 V or above 0.7 V. Motor Stimulation Parameters: 2 Hz was used to evaluate the motor component. Care was taken not to lesion any nerves that demonstrated motor stimulation of the lower extremities at an output of less than 2.5 times that of the sensory threshold, or a maximum of 2.0 V. Lesioning Technique Parameters: Standard Radiofrequency settings. (Not bipolar or pulsed.) Temperature Settings: 80 degrees C Lesioning time: 60 seconds Intra-operative Compliance: Compliant  Once the entire procedure was completed, the treated area was cleaned, making sure to leave some of the prepping solution back to take advantage of its long term bactericidal properties.    Illustration of the posterior view of the lumbar spine and the posterior neural structures. Laminae of L2 through S1  are labeled. DPRL5, dorsal primary ramus of L5; DPRS1, dorsal primary ramus of S1; DPR3, dorsal primary ramus of L3; FJ, facet (zygapophyseal) joint L3-L4; I, inferior articular process of L4; LB1, lateral branch of dorsal primary ramus of L1; IAB, inferior articular branches from L3 medial branch (supplies L4-L5 facet joint); IBP, intermediate branch plexus; MB3, medial branch of dorsal primary ramus of L3; NR3, third lumbar nerve root; S, superior articular process of L5;  SAB, superior articular branches from L4 (supplies L4-5 facet joint also); TP3, transverse process of L3.  Vitals:   06/15/21 1034 06/15/21 1044 06/15/21 1054 06/15/21 1104  BP: 110/74 131/75 118/82 128/85  Pulse:      Resp: 18 15 18 16   Temp:      SpO2: 100% 94% 94% 96%  Weight:      Height:       Start Time: 0955 hrs. End Time: 1034 hrs.  Imaging Guidance (Spinal):          Type of Imaging Technique: Fluoroscopy Guidance (Spinal) Indication(s): Assistance in needle guidance and placement for procedures requiring needle placement in or near specific anatomical locations not easily accessible without such assistance. Exposure Time: Please see nurses notes. Contrast: None used. Fluoroscopic Guidance: I was personally present during the use of fluoroscopy. "Tunnel Vision Technique" used to obtain the best possible view of the target area. Parallax error corrected before commencing the procedure. "Direction-depth-direction" technique used to introduce the needle under continuous pulsed fluoroscopy. Once target was reached, antero-posterior, oblique, and lateral fluoroscopic projection used confirm needle placement in all planes. Images permanently stored in EMR. Interpretation: No contrast injected. I personally interpreted the imaging intraoperatively. Adequate needle placement confirmed in multiple planes. Permanent images saved into the patient's record.  Antibiotic Prophylaxis:   Anti-infectives (From admission, onward)     None      Indication(s): None identified  Post-operative Assessment:  Post-procedure Vital Signs:  Pulse/HCG Rate: 63(!) 58 Temp: (!) 97.3 F (36.3 C) Resp: 16 BP: 128/85 SpO2: 96 %  EBL: None  Complications: No immediate post-treatment complications observed by team, or reported by patient.  Note: The patient tolerated the entire procedure well. A repeat set of vitals were taken after the procedure and the patient was kept under observation following institutional policy, for this type of procedure. Post-procedural neurological assessment was performed, showing return to baseline, prior to discharge. The patient was provided with post-procedure discharge instructions, including a section on how to identify potential problems. Should any problems arise concerning this procedure, the patient was given instructions to immediately contact us, at any time, without hesitation. In any case, we plan to contact the patient by telephone for a follow-up status report regarding this interventional procedure.  Comments:  No additional relevant information.  Plan of Care  Orders:  Orders Placed This Encounter  Procedures   Radiofrequency,Lumbar    Scheduling Instructions:     Side(s): Right-sided     Level: L3-4, L4-5, & L5-S1 Facets (L2, L3, L4, L5, & S1 Medial Branch Nerves)     Sedation: Patient's choice.     Timeframe: Today    Order Specific Question:   Where will this procedure be performed?    Answer:   [facility]   DG [facility] C-ARM 1-60 MIN NO REPORT    Intraoperative interpretation by procedural physician at [facility].    Standing Status:   Standing    Number of Occurrences:   1    Order Specific Question:   Reason for exam:    Answer:   Assistance in needle guidance and placement for procedures requiring needle placement in or near specific anatomical locations not easily accessible without such assistance.   Informed Consent Details:  Physician/Practitioner Attestation; Transcribe to consent form and obtain patient signature    Nursing Order: Transcribe to consent form and obtain patient signature. Note: Always confirm laterality of pain with Mr. Granada, before procedure.  Order Specific Question:   Physician/Practitioner attestation of informed consent for procedure/surgical case    Answer:   I, the physician/practitioner, attest that I have discussed with the patient the benefits, risks, side effects, alternatives, likelihood of achieving goals and potential problems during recovery for the procedure that I have provided informed consent.    Order Specific Question:   Procedure    Answer:   Lumbar Facet Radiofrequency Ablation    Order Specific Question:   Physician/Practitioner performing the procedure    Answer:   Arbor Cohen A. Dossie Arbour, MD    Order Specific Question:   Indication/Reason    Answer:   Low Back Pain, with our without leg pain, due to Facet Joint Arthralgia (Joint Pain) known as Lumbar Facet Syndrome, secondary to Lumbar, and/or Lumbosacral Spondylosis (Arthritis of the Spine), without myelopathy or radiculopathy (Nerve Damage).   Care order/instruction: Please confirm that the patient has stopped the Xarelto (Rivaroxaban) x 3 days prior to procedure or surgery.    Please confirm that the patient has stopped the Xarelto (Rivaroxaban) x 3 days prior to procedure or surgery.    Standing Status:   Standing    Number of Occurrences:   1   Provide equipment / supplies at bedside    "Radiofrequency Tray"; Large hemostat (x1); Small hemostat (x1); Towels (x8); 4x4 sterile sponge pack (x1) Needle type: Teflon-coated Radiofrequency Needle (Disposable   single use) Size: Regular Quantity: 5    Standing Status:   Standing    Number of Occurrences:   1    Order Specific Question:   Specify    Answer:   Radiofrequency Tray   Bleeding precautions    Standing Status:   Standing    Number of Occurrences:   1    Chronic Opioid Analgesic:  No opioid analgesics prescribed by our practice. Highest recorded MME/day: 26.67 mg/day MME/day: 0 mg/day   Medications ordered for procedure: Meds ordered this encounter  Medications   lidocaine (XYLOCAINE) 2 % (with pres) injection 400 mg   lactated ringers infusion 1,000 mL   midazolam (VERSED) 5 MG/5ML injection 0.5-2 mg    Make sure Flumazenil is available in the pyxis when using this medication. If oversedation occurs, administer 0.2 mg IV over 15 sec. If after 45 sec no response, administer 0.2 mg again over 1 min; may repeat at 1 min intervals; not to exceed 4 doses (1 mg)   fentaNYL (SUBLIMAZE) injection 25-50 mcg    Make sure Narcan is available in the pyxis when using this medication. In the event of respiratory depression (RR< 8/min): Titrate NARCAN (naloxone) in increments of 0.1 to 0.2 mg IV at 2-3 minute intervals, until desired degree of reversal.   ropivacaine (PF) 2 mg/mL (0.2%) (NAROPIN) injection 9 mL   triamcinolone acetonide (KENALOG-40) injection 40 mg   HYDROcodone-acetaminophen (NORCO/VICODIN) 5-325 MG tablet    Sig: Take 1 tablet by mouth every 8 (eight) hours as needed for up to 7 days for severe pain. Must last 7 days.    Dispense:  21 tablet    Refill:  0    For acute post-operative pain. Not to be refilled. Must last 7 days.   HYDROcodone-acetaminophen (NORCO/VICODIN) 5-325 MG tablet    Sig: Take 1 tablet by mouth every 8 (eight) hours as needed for up to 7 days for severe pain. Must last for 7 days.    Dispense:  21 tablet    Refill:  0    For acute post-operative pain. Not  to be refilled. Must last 7 days.   Medications administered: We administered lidocaine, lactated ringers, midazolam, fentaNYL, ropivacaine (PF) 2 mg/mL (0.2%), and triamcinolone acetonide.  See the medical record for exact dosing, route, and time of administration.  Follow-up plan:   Return in about 6 weeks (around 07/27/2021) for (76min), (ECT)  procedure: (L) L-FCT RFA #2, (Sed-anx), (Blood Thinner Protocol).       Interventional Therapies  Risk   Complexity Considerations:   NOTE: Xarelto Anticoagulation (Stop: 3 days   Restart: 6 hours)   Planned   Pending:   Therapeutic bilateral lumbar facet medial branch RFA, starting with the right side.   Under consideration:   Possible right opturator + femoral NB  Possible right opturator + femoral nerve RFA  Possible right SI joint RFA  Diagnostic right lumbar facet block  Possible right lumbar facet RFA (the patient indicates having had approximately 5 prior RFA)   Completed:   Therapeutic right IA hip joint injection x2 (100/100/50) Diagnostic bilateral SI joint block x2 (50/50/100 x 5 wks/0)  Therapeutic left lumbar facet RFA #1 (05/28/2020) (100% ongoing 6 weeks after procedure)  Therapeutic right lumbar facet RFA #1 (06/11/2020) (100% ongoing 6 weeks after procedure)    Palliative options:   Palliative right IA hip joint injection #2  (last done 04/30/2019) Palliative bilateral SI joint block #2      Recent Visits Date Type Provider Dept  06/02/21 Office Visit Milinda Pointer, MD [facility]  Showing recent visits within past 90 days and meeting all other requirements Today's Visits Date Type Provider Dept  06/15/21 Procedure visit Milinda Pointer, MD [facility]  Showing today's visits and meeting all other requirements Future Appointments No visits were found meeting these conditions. Showing future appointments within next 90 days and meeting all other requirements  Disposition: Discharge home  Discharge (Date   Time): 06/15/2021; 1105 hrs.   Primary Care Physician: [facility] [place] Location: [facility] Note by: Gaspar Cola, MD Date: 06/15/2021; Time: 1:34 PM  Disclaimer:  Medicine is not an Chief Strategy Officer. The only guarantee in medicine is that nothing is guaranteed. It is  important to note that the decision to proceed with this intervention was based on the information collected from the patient. The Data and conclusions were drawn from the patient's questionnaire, the interview, and the physical examination. Because the information was provided in large part by the patient, it cannot be guaranteed that it has not been purposely or unconsciously manipulated. Every effort has been made to obtain as much relevant data as possible for this evaluation. It is important to note that the conclusions that lead to this procedure are derived in large part from the available data. Always take into account that the treatment will also be dependent on availability of resources and existing treatment guidelines, considered by other Pain Management Practitioners as being common knowledge and practice, at the time of the intervention. For Medico-Legal purposes, it is also important to point out that variation in procedural techniques and pharmacological choices are the acceptable norm. The indications, contraindications, technique, and results of the above procedure should only be interpreted and judged by a Board-Certified Interventional Pain Specialist with extensive familiarity and expertise in the same exact procedure and technique.

## 2021-06-15 NOTE — Patient Instructions (Addendum)
___________________________________________________________________________________________  Post-Radiofrequency (RF) Discharge Instructions  You have just completed a Radiofrequency Neurotomy.  The following instructions will provide you with information and guidelines for self-care upon discharge.  If at any time you have questions or concerns please call your physician. DO NOT DRIVE YOURSELF!!  Instructions: Apply ice: Fill a plastic sandwich bag with crushed ice. Cover it with a small towel and apply to injection site. Apply for 15 minutes then remove x 15 minutes. Repeat sequence on day of procedure, until you go to bed. The purpose is to minimize swelling and discomfort after procedure. Apply heat: Apply heat to procedure site starting the day following the procedure. The purpose is to treat any soreness and discomfort from the procedure. Food intake: No eating limitations, unless stipulated above.  Nevertheless, if you have had sedation, you may experience some nausea.  In this case, it may be wise to wait at least two hours prior to resuming regular diet. Physical activities: Keep activities to a minimum for the first 8 hours after the procedure. For the first 24 hours after the procedure, do not drive a motor vehicle,  Operate heavy machinery, power tools, or handle any weapons.  Consider walking with the use of an assistive device or accompanied by an adult for the first 24 hours.  Do not drink alcoholic beverages including beer.  Do not make any important decisions or sign any legal documents. Go home and rest today.  Resume activities tomorrow, as tolerated.  Use caution in moving about as you may experience mild leg weakness.  Use caution in cooking, use of household electrical appliances and climbing steps. Driving: If you have received any sedation, you are not allowed to drive for 24 hours after your procedure. Blood thinner: Restart your blood thinner 6 hours after your procedure. (Only  for those taking blood thinners) Insulin: As soon as you can eat, you may resume your normal dosing schedule. (Only for those taking insulin) Medications: May resume pre-procedure medications.  Do not take any drugs, other than what has been prescribed to you. Infection prevention: Keep procedure site clean and dry. Post-procedure Pain Diary: Extremely important that this be done correctly and accurately. Recorded information will be used to determine the next step in treatment. Pain evaluated is that of treated area only. Do not include pain from an untreated area. Complete every hour, on the hour, for the initial 8 hours. Set an alarm to help you do this part accurately. Do not go to sleep and have it completed later. It will not be accurate. Follow-up appointment: Keep your follow-up appointment after the procedure. Usually 2-6 weeks after radiofrequency. Bring you pain diary. The information collected will be essential for your long-term care.   Expect: From numbing medicine (AKA: Local Anesthetics): Numbness or decrease in pain. Onset: Full effect within 15 minutes of injected. Duration: It will depend on the type of local anesthetic used. On the average, 1 to 8 hours.  From steroids (when added): Decrease in swelling or inflammation. Once inflammation is improved, relief of the pain will follow. Onset of benefits: Depends on the amount of swelling present. The more swelling, the longer it will take for the benefits to be seen. In some cases, up to 10 days. Duration: Steroids will stay in the system x 2 weeks. Duration of benefits will depend on multiple posibilities including persistent irritating factors. From procedure: Some discomfort is to be expected once the numbing medicine wears off. In the case of radiofrequency procedures,  this may last as long as 6 weeks. Additional post-procedure pain medication is provided for this. Discomfort is minimized if ice and heat are applied as  instructed.  Call if: You experience numbness and weakness that gets worse with time, as opposed to wearing off. He experience any unusual bleeding, difficulty breathing, or loss of the ability to control your bowel and bladder. (This applies to Spinal procedures only) You experience any redness, swelling, heat, red streaks, elevated temperature, fever, or any other signs of a possible infection.  Emergency Numbers: [place] hours (Monday - Thursday, 8:00 AM - 4:00 PM) (Friday, 9:00 AM - 12:00 Noon): [phone] After hours: [phone] ____________________________________________________________________________________________  ______________________________________________________________________  Preparing for Procedure with Sedation  NOTICE: Due to recent regulatory changes, starting on December 21, 2020, procedures requiring intravenous (IV) sedation will no longer be performed at the [place].  These types of procedures are required to be performed at [facility] ambulatory surgery facility.  We are very sorry for the inconvenience.  Procedure appointments are limited to planned procedures: No Prescription Refills. No disability issues will be discussed. No medication changes will be discussed.  Instructions: Oral Intake: Do not eat or drink anything for at least 8 hours prior to your procedure. (Exception: Blood Pressure Medication. See below.) Transportation: A driver is required. You may not drive yourself after the procedure. Blood Pressure Medicine: Do not forget to take your blood pressure medicine with a sip of water the morning of the procedure. If your Diastolic (lower reading) is above 100 mmHg, elective cases will be cancelled/rescheduled. Blood thinners: These will need to be stopped for procedures. Notify our staff if you are taking any blood thinners. Depending on which one you take, there will be specific instructions on how and when to stop  it. Diabetics on insulin: Notify the staff so that you can be scheduled 1st case in the morning. If your diabetes requires high dose insulin, take only  of your normal insulin dose the morning of the procedure and notify the staff that you have done so. Preventing infections: Shower with an antibacterial soap the morning of your procedure. Build-up your immune system: Take 1000 mg of Vitamin C with every meal (3 times a day) the day prior to your procedure. Antibiotics: Inform the staff if you have a condition or reason that requires you to take antibiotics before dental procedures. Pregnancy: If you are pregnant, call and cancel the procedure. Sickness: If you have a cold, fever, or any active infections, call and cancel the procedure. Arrival: You must be in the facility at least 30 minutes prior to your scheduled procedure. Children: Do not bring children with you. Dress appropriately: Bring dark clothing that you would not mind if they get stained. Valuables: Do not bring any jewelry or valuables.  Reasons to call and reschedule or cancel your procedure: (Following these recommendations will minimize the risk of a serious complication.) Surgeries: Avoid having procedures within 2 weeks of any surgery. (Avoid for 2 weeks before or after any surgery). Flu Shots: Avoid having procedures within 2 weeks of a flu shots. (Avoid for 2 weeks before or after immunizations). Barium: Avoid having a procedure within 7-10 days after having had a radiological study involving the use of radiological contrast. (Myelograms, Barium swallow or enema study). Heart attacks: Avoid any elective procedures or surgeries for the initial 6 months after a "Myocardial Infarction" (Heart Attack). Blood thinners: It is imperative that you stop these medications before procedures. Let  us know if you if you take any blood thinner.  Infection: Avoid procedures during or within two weeks of an infection (including chest colds or  gastrointestinal problems). Symptoms associated with infections include: Localized redness, fever, chills, night sweats or profuse sweating, burning sensation when voiding, cough, congestion, stuffiness, runny nose, sore throat, diarrhea, nausea, vomiting, cold or Flu symptoms, recent or current infections. It is specially important if the infection is over the area that we intend to treat. Heart and lung problems: Symptoms that may suggest an active cardiopulmonary problem include: cough, chest pain, breathing difficulties or shortness of breath, dizziness, ankle swelling, uncontrolled high or unusually low blood pressure, and/or palpitations. If you are experiencing any of these symptoms, cancel your procedure and contact your primary care physician for an evaluation.  Remember:  Regular Business hours are:  Monday to Thursday 8:00 AM to 4:00 PM  Provider's Schedule: Milinda Pointer, MD:  Procedure days: Tuesday and Thursday 7:30 AM to 4:00 PM  Gillis Santa, MD:  Procedure days: Monday and Wednesday 7:30 AM to 4:00 PM ______________________________________________________________________  ____________________________________________________________________________________________  General Risks and Possible Complications  Patient Responsibilities: It is important that you read this as it is part of your informed consent. It is our duty to inform you of the risks and possible complications associated with treatments offered to you. It is your responsibility as a patient to read this and to ask questions about anything that is not clear or that you believe was not covered in this document.  Patients Rights: You have the right to refuse treatment. You also have the right to change your mind, even after initially having agreed to have the treatment done. However, under this last option, if you wait until the last second to change your mind, you may be charged for the materials used up to that  point.  Introduction: Medicine is not an Chief Strategy Officer. Everything in Medicine, including the lack of treatment(s), carries the potential for danger, harm, or loss (which is by definition: Risk). In Medicine, a complication is a secondary problem, condition, or disease that can aggravate an already existing one. All treatments carry the risk of possible complications. The fact that a side effects or complications occurs, does not imply that the treatment was conducted incorrectly. It must be clearly understood that these can happen even when everything is done following the highest safety standards.  No treatment: You can choose not to proceed with the proposed treatment alternative. The PRO(s) would include: avoiding the risk of complications associated with the therapy. The CON(s) would include: not getting any of the treatment benefits. These benefits fall under one of three categories: diagnostic; therapeutic; and/or palliative. Diagnostic benefits include: getting information which can ultimately lead to improvement of the disease or symptom(s). Therapeutic benefits are those associated with the successful treatment of the disease. Finally, palliative benefits are those related to the decrease of the primary symptoms, without necessarily curing the condition (example: decreasing the pain from a flare-up of a chronic condition, such as incurable terminal cancer).  General Risks and Complications: These are associated to most interventional treatments. They can occur alone, or in combination. They fall under one of the following six (6) categories: no benefit or worsening of symptoms; bleeding; infection; nerve damage; allergic reactions; and/or death. No benefits or worsening of symptoms: In Medicine there are no guarantees, only probabilities. No healthcare provider can ever guarantee that a medical treatment will work, they can only state the probability that it may. Furthermore,  there is always the  possibility that the condition may worsen, either directly, or indirectly, as a consequence of the treatment. Bleeding: This is more common if the patient is taking a blood thinner, either prescription or over the counter (example: Goody Powders, Fish oil, Aspirin, Garlic, etc.), or if suffering a condition associated with impaired coagulation (example: Hemophilia, cirrhosis of the liver, low platelet counts, etc.). However, even if you do not have one on these, it can still happen. If you have any of these conditions, or take one of these drugs, make sure to notify your treating physician. Infection: This is more common in patients with a compromised immune system, either due to disease (example: diabetes, cancer, human immunodeficiency virus [HIV], etc.), or due to medications or treatments (example: therapies used to treat cancer and rheumatological diseases). However, even if you do not have one on these, it can still happen. If you have any of these conditions, or take one of these drugs, make sure to notify your treating physician. Nerve Damage: This is more common when the treatment is an invasive one, but it can also happen with the use of medications, such as those used in the treatment of cancer. The damage can occur to small secondary nerves, or to large primary ones, such as those in the spinal cord and brain. This damage may be temporary or permanent and it may lead to impairments that can range from temporary numbness to permanent paralysis and/or brain death. Allergic Reactions: Any time a substance or material comes in contact with our body, there is the possibility of an allergic reaction. These can range from a mild skin rash (contact dermatitis) to a severe systemic reaction (anaphylactic reaction), which can result in death. Death: In general, any medical intervention can result in death, most of the time due to an unforeseen  complication. ____________________________________________________________________________________________ ___________________________________________________________________________________________  Post-Radiofrequency (RF) Discharge Instructions  You have just completed a Radiofrequency Neurotomy.  The following instructions will provide you with information and guidelines for self-care upon discharge.  If at any time you have questions or concerns please call your physician. DO NOT DRIVE YOURSELF!!  Instructions: Apply ice: Fill a plastic sandwich bag with crushed ice. Cover it with a small towel and apply to injection site. Apply for 15 minutes then remove x 15 minutes. Repeat sequence on day of procedure, until you go to bed. The purpose is to minimize swelling and discomfort after procedure. Apply heat: Apply heat to procedure site starting the day following the procedure. The purpose is to treat any soreness and discomfort from the procedure. Food intake: No eating limitations, unless stipulated above.  Nevertheless, if you have had sedation, you may experience some nausea.  In this case, it may be wise to wait at least two hours prior to resuming regular diet. Physical activities: Keep activities to a minimum for the first 8 hours after the procedure. For the first 24 hours after the procedure, do not drive a motor vehicle,  Operate heavy machinery, power tools, or handle any weapons.  Consider walking with the use of an assistive device or accompanied by an adult for the first 24 hours.  Do not drink alcoholic beverages including beer.  Do not make any important decisions or sign any legal documents. Go home and rest today.  Resume activities tomorrow, as tolerated.  Use caution in moving about as you may experience mild leg weakness.  Use caution in cooking, use of household electrical appliances and climbing steps. Driving: If  you have received any sedation, you are not allowed to drive for  24 hours after your procedure. Blood thinner: Restart your blood thinner 6 hours after your procedure. (Only for those taking blood thinners) Insulin: As soon as you can eat, you may resume your normal dosing schedule. (Only for those taking insulin) Medications: May resume pre-procedure medications.  Do not take any drugs, other than what has been prescribed to you. Infection prevention: Keep procedure site clean and dry. Post-procedure Pain Diary: Extremely important that this be done correctly and accurately. Recorded information will be used to determine the next step in treatment. Pain evaluated is that of treated area only. Do not include pain from an untreated area. Complete every hour, on the hour, for the initial 8 hours. Set an alarm to help you do this part accurately. Do not go to sleep and have it completed later. It will not be accurate. Follow-up appointment: Keep your follow-up appointment after the procedure. Usually 2-6 weeks after radiofrequency. Bring you pain diary. The information collected will be essential for your long-term care.   Expect: From numbing medicine (AKA: Local Anesthetics): Numbness or decrease in pain. Onset: Full effect within 15 minutes of injected. Duration: It will depend on the type of local anesthetic used. On the average, 1 to 8 hours.  From steroids (when added): Decrease in swelling or inflammation. Once inflammation is improved, relief of the pain will follow. Onset of benefits: Depends on the amount of swelling present. The more swelling, the longer it will take for the benefits to be seen. In some cases, up to 10 days. Duration: Steroids will stay in the system x 2 weeks. Duration of benefits will depend on multiple posibilities including persistent irritating factors. From procedure: Some discomfort is to be expected once the numbing medicine wears off. In the case of radiofrequency procedures, this may last as long as 6 weeks. Additional  post-procedure pain medication is provided for this. Discomfort is minimized if ice and heat are applied as instructed.  Call if: You experience numbness and weakness that gets worse with time, as opposed to wearing off. He experience any unusual bleeding, difficulty breathing, or loss of the ability to control your bowel and bladder. (This applies to Spinal procedures only) You experience any redness, swelling, heat, red streaks, elevated temperature, fever, or any other signs of a possible infection.  Emergency Numbers: [place] business hours (Monday - Thursday, 8:00 AM - 4:00 PM) (Friday, 9:00 AM - 12:00 Noon): [phone] After hours: [phone] ____________________________________________________________________________________________

## 2021-06-15 NOTE — Progress Notes (Signed)
Safety precautions to be maintained throughout the outpatient stay will include: orient to surroundings, keep bed in low position, maintain call bell within reach at all times, provide assistance with transfer out of bed and ambulation.  

## 2021-06-16 ENCOUNTER — Telehealth: Payer: Self-pay

## 2021-06-16 ENCOUNTER — Telehealth: Payer: Self-pay | Admitting: *Deleted

## 2021-06-16 NOTE — Telephone Encounter (Signed)
Called patient to let him know that we have heard from [facility] team and they have approved for him to hold xarelto x 3 days and the day of procedure.  Patient verbalizes u/o information.  He is scheduled for RFA on 08/03/21.

## 2021-06-16 NOTE — Telephone Encounter (Signed)
Post procedure phone call.  Mailbox is full.  Unable to leave message.

## 2021-06-21 ENCOUNTER — Telehealth: Payer: Self-pay | Admitting: Pain Medicine

## 2021-06-21 NOTE — Telephone Encounter (Signed)
OK per Dr. Dossie Arbour to bring patient in earlier. [facility] sent Gerilyn Pilgrim and Larene Beach.

## 2021-06-21 NOTE — Telephone Encounter (Signed)
Patient wants to know if he can have his 2nd RFA in early Feb instead of March

## 2021-06-29 ENCOUNTER — Other Ambulatory Visit: Payer: Self-pay

## 2021-06-29 ENCOUNTER — Ambulatory Visit
Admission: RE | Admit: 2021-06-29 | Discharge: 2021-06-29 | Disposition: A | Discharge status: Home / Self Care | Payer: No Typology Code available for payment source | Source: Ambulatory Visit | Attending: Pain Medicine | Admitting: Pain Medicine

## 2021-06-29 ENCOUNTER — Encounter: Payer: Self-pay | Admitting: Pain Medicine

## 2021-06-29 ENCOUNTER — Ambulatory Visit (HOSPITAL_BASED_OUTPATIENT_CLINIC_OR_DEPARTMENT_OTHER): Payer: No Typology Code available for payment source | Admitting: Pain Medicine

## 2021-06-29 ENCOUNTER — Ambulatory Visit: Payer: Medicare Other | Admitting: Pain Medicine

## 2021-06-29 VITALS — BP 143/100 | HR 58 | Temp 98.5°F | Resp 17 | Ht 71.0 in | Wt 260.0 lb

## 2021-06-29 DIAGNOSIS — G8929 Other chronic pain: Secondary | ICD-10-CM | POA: Diagnosis not present

## 2021-06-29 DIAGNOSIS — Z7901 Long term (current) use of anticoagulants: Secondary | ICD-10-CM | POA: Insufficient documentation

## 2021-06-29 DIAGNOSIS — M545 Low back pain, unspecified: Secondary | ICD-10-CM

## 2021-06-29 DIAGNOSIS — M5137 Other intervertebral disc degeneration, lumbosacral region: Secondary | ICD-10-CM | POA: Diagnosis not present

## 2021-06-29 DIAGNOSIS — G8918 Other acute postprocedural pain: Secondary | ICD-10-CM

## 2021-06-29 DIAGNOSIS — M47816 Spondylosis without myelopathy or radiculopathy, lumbar region: Secondary | ICD-10-CM

## 2021-06-29 MED ORDER — LACTATED RINGERS IV SOLN
1000.0000 mL | Freq: Once | INTRAVENOUS | Status: AC
Start: 2021-06-29 — End: 2021-06-29
  Administered 2021-06-29: 1000 mL via INTRAVENOUS

## 2021-06-29 MED ORDER — HYDROCODONE-ACETAMINOPHEN 5-325 MG PO TABS: 1.0000 | ORAL_TABLET | Freq: Three times a day (TID) | ORAL | 0 refills | Status: DC | PRN

## 2021-06-29 MED ORDER — MIDAZOLAM HCL 5 MG/5ML IJ SOLN
0.5000 mg | Freq: Once | INTRAMUSCULAR | Status: AC
  Administered 2021-06-29: 2 mg via INTRAVENOUS

## 2021-06-29 MED ORDER — MIDAZOLAM HCL 5 MG/5ML IJ SOLN
INTRAMUSCULAR | Status: AC
  Filled 2021-06-29: qty 5

## 2021-06-29 MED ORDER — TRIAMCINOLONE ACETONIDE 40 MG/ML IJ SUSP
40.0000 mg | Freq: Once | INTRAMUSCULAR | Status: AC
Start: 2021-06-29 — End: 2021-06-29
  Administered 2021-06-29: 40 mg

## 2021-06-29 MED ORDER — LIDOCAINE HCL 2 % IJ SOLN
20.0000 mL | Freq: Once | INTRAMUSCULAR | Status: AC
Start: 2021-06-29 — End: 2021-06-29
  Administered 2021-06-29: 400 mg

## 2021-06-29 MED ORDER — FENTANYL CITRATE (PF) 100 MCG/2ML IJ SOLN: 25.0000 ug | INTRAMUSCULAR | Status: DC | PRN

## 2021-06-29 MED ORDER — ROPIVACAINE HCL 2 MG/ML IJ SOLN
9.0000 mL | Freq: Once | INTRAMUSCULAR | Status: AC
Start: 2021-06-29 — End: 2021-06-29
  Administered 2021-06-29: 20 mL via PERINEURAL

## 2021-06-29 MED ORDER — FENTANYL CITRATE (PF) 100 MCG/2ML IJ SOLN
INTRAMUSCULAR | Status: AC
  Filled 2021-06-29: qty 2

## 2021-06-29 NOTE — Progress Notes (Signed)
Safety precautions to be maintained throughout the outpatient stay will include: orient to surroundings, keep bed in low position, maintain call bell within reach at all times, provide assistance with transfer out of bed and ambulation.  

## 2021-06-29 NOTE — Patient Instructions (Addendum)
Go back on your Xarelto no sooner than 6 hours after the procedure. ___________________________________________________________________________________________  Post-Radiofrequency (RF) Discharge Instructions  You have just completed a Radiofrequency Neurotomy.  The following instructions will provide you with information and guidelines for self-care upon discharge.  If at any time you have questions or concerns please call your physician. DO NOT DRIVE YOURSELF!!  Instructions: Apply ice: Fill a plastic sandwich bag with crushed ice. Cover it with a small towel and apply to injection site. Apply for 15 minutes then remove x 15 minutes. Repeat sequence on day of procedure, until you go to bed. The purpose is to minimize swelling and discomfort after procedure. Apply heat: Apply heat to procedure site starting the day following the procedure. The purpose is to treat any soreness and discomfort from the procedure. Food intake: No eating limitations, unless stipulated above.  Nevertheless, if you have had sedation, you may experience some nausea.  In this case, it may be wise to wait at least two hours prior to resuming regular diet. Physical activities: Keep activities to a minimum for the first 8 hours after the procedure. For the first 24 hours after the procedure, do not drive a motor vehicle,  Operate heavy machinery, power tools, or handle any weapons.  Consider walking with the use of an assistive device or accompanied by an adult for the first 24 hours.  Do not drink alcoholic beverages including beer.  Do not make any important decisions or sign any legal documents. Go home and rest today.  Resume activities tomorrow, as tolerated.  Use caution in moving about as you may experience mild leg weakness.  Use caution in cooking, use of household electrical appliances and climbing steps. Driving: If you have received any sedation, you are not allowed to drive for 24 hours after your procedure. Blood  thinner: Restart your blood thinner 6 hours after your procedure. (Only for those taking blood thinners) Insulin: As soon as you can eat, you may resume your normal dosing schedule. (Only for those taking insulin) Medications: May resume pre-procedure medications.  Do not take any drugs, other than what has been prescribed to you. Infection prevention: Keep procedure site clean and dry. Post-procedure Pain Diary: Extremely important that this be done correctly and accurately. Recorded information will be used to determine the next step in treatment. Pain evaluated is that of treated area only. Do not include pain from an untreated area. Complete every hour, on the hour, for the initial 8 hours. Set an alarm to help you do this part accurately. Do not go to sleep and have it completed later. It will not be accurate. Follow-up appointment: Keep your follow-up appointment after the procedure. Usually 2-6 weeks after radiofrequency. Bring you pain diary. The information collected will be essential for your long-term care.   Expect: From numbing medicine (AKA: Local Anesthetics): Numbness or decrease in pain. Onset: Full effect within 15 minutes of injected. Duration: It will depend on the type of local anesthetic used. On the average, 1 to 8 hours.  From steroids (when added): Decrease in swelling or inflammation. Once inflammation is improved, relief of the pain will follow. Onset of benefits: Depends on the amount of swelling present. The more swelling, the longer it will take for the benefits to be seen. In some cases, up to 10 days. Duration: Steroids will stay in the system x 2 weeks. Duration of benefits will depend on multiple posibilities including persistent irritating factors. From procedure: Some discomfort is to be  expected once the numbing medicine wears off. In the case of radiofrequency procedures, this may last as long as 6 weeks. Additional post-procedure pain medication is provided for  this. Discomfort is minimized if ice and heat are applied as instructed.  Call if: You experience numbness and weakness that gets worse with time, as opposed to wearing off. He experience any unusual bleeding, difficulty breathing, or loss of the ability to control your bowel and bladder. (This applies to Spinal procedures only) You experience any redness, swelling, heat, red streaks, elevated temperature, fever, or any other signs of a possible infection.  Emergency Numbers: [place] hours (Monday - Thursday, 8:00 AM - 4:00 PM) (Friday, 9:00 AM - 12:00 Noon): [phone] After hours: [phone] ____________________________________________________________________________________________ ____________________________________________________________________________________________  Blood Thinners  IMPORTANT NOTICE:  If you take any of these, make sure to notify the nursing staff.  Failure to do so may result in injury.  Recommended time intervals to stop and restart blood-thinners, before & after invasive procedures  Generic Name Brand Name Pre-procedure. Stop this long before procedure. Post-procedure. Minimum waiting period before restarting.  Abciximab Reopro 15 days 2 hrs  Alteplase Activase 10 days 10 days  Anagrelide Agrylin    Apixaban Eliquis 3 days 6 hrs  Cilostazol Pletal 3 days 5 hrs  Clopidogrel Plavix 7-10 days 2 hrs  Dabigatran Pradaxa 5 days 6 hrs  Dalteparin Fragmin 24 hours 4 hrs  Dipyridamole Aggrenox 11days 2 hrs  Edoxaban Lixiana; Savaysa 3 days 2 hrs  Enoxaparin  Lovenox 24 hours 4 hrs  Eptifibatide Integrillin 8 hours 2 hrs  Fondaparinux  Arixtra 72 hours 12 hrs  Hydroxychloroquine Plaquenil 11 days   Prasugrel Effient 7-10 days 6 hrs  Reteplase Retavase 10 days 10 days  Rivaroxaban Xarelto 3 days 6 hrs  Ticagrelor Brilinta 5-7 days 6 hrs  Ticlopidine Ticlid 10-14 days 2 hrs  Tinzaparin Innohep 24 hours 4 hrs  Tirofiban Aggrastat 8 hours 2  hrs  Warfarin Coumadin 5 days 2 hrs   Other medications with blood-thinning effects  Product indications Generic (Brand) names Note  Cholesterol Lipitor Stop 4 days before procedure  Blood thinner (injectable) Heparin (LMW or LMWH Heparin) Stop 24 hours before procedure  Cancer Ibrutinib (Imbruvica) Stop 7 days before procedure  Malaria/Rheumatoid Hydroxychloroquine (Plaquenil) Stop 11 days before procedure  Thrombolytics  10 days before or after procedures   Over-the-counter (OTC) Products with blood-thinning effects  Product Common names Stop Time  Aspirin > 325 mg Goody Powders, Excedrin, etc. 11 days  Aspirin ? 81 mg  7 days  Fish oil  4 days  Garlic supplements  7 days  Ginkgo biloba  36 hours  Ginseng  24 hours  NSAIDs Ibuprofen, Naprosyn, etc. 3 days  Vitamin E  4 days   ____________________________________________________________________________________________

## 2021-06-29 NOTE — Progress Notes (Signed)
PROVIDER NOTE: Interpretation of information contained herein should be left to medically-trained personnel. Specific patient instructions are provided elsewhere under "Patient Instructions" section of medical record. This document was created in part using STT-dictation technology, any transcriptional errors that may result from this process are unintentional.  Patient: [name] Whitaker Type: Established DOB: March 13, 1951 MRN: [id number] PCP: [facility] [place]  Service: Procedure DOS: 06/29/2021 Setting: Ambulatory Location: Ambulatory outpatient facility Delivery: Face-to-face Provider: Gaspar Cola, MD Specialty: Interventional Pain Management Specialty designation: 09 Location: Outpatient facility Ref. Prov.: [facility], [facility]    Primary Reason for Visit: Interventional Pain Management Treatment. CC: Back Pain (Left lumbar )  Procedure:           Type: Lumbar Facet, Medial Branch Radiofrequency Ablation (RFA) #2  Laterality: Left  Level: L2, L3, L4, L5, & S1 Medial Branch Level(s). These levels will denervate the L3-4 and L5-S1 lumbar facet joints.  Imaging: Fluoroscopic guidance Anesthesia: Local anesthesia (1-2% Lidocaine) Anxiolysis: IV Versed         Sedation: Moderate conscious sedation. DOS: 06/29/2021  Performed by: Gaspar Cola, MD  Purpose: Therapeutic/Palliative Indications: Low back pain severe enough to impact quality of life or function. Indications: 1. Lumbar facet joint syndrome (Bilateral) (R>L)   2. Spondylosis of lumbar region without myelopathy or radiculopathy   3. Lumbar facet hypertrophy (Multilevel)    4. DDD (degenerative disc disease), lumbosacral   5. Chronic low back pain (1ry area of Pain) (Bilateral) (R>L) w/o sciatica    Chronic anticoagulation (Xarelto)    Mr. Shin has been dealing with the above chronic pain for longer than three months and has either failed to respond, was unable to tolerate, or  simply did not get enough benefit from other more conservative therapies including, but not limited to: 1. Over-the-counter medications 2. Anti-inflammatory medications 3. Muscle relaxants 4. Membrane stabilizers 5. Opioids 6. Physical therapy and/or chiropractic manipulation 7. Modalities (Heat, ice, etc.) 8. Invasive techniques such as nerve blocks. Mr. Jurewicz has attained more than 50% relief of the pain from a series of diagnostic injections conducted in separate occasions.  Pain Score: Pre-procedure: 8 /10 Post-procedure: 0-No pain/10    Position / Prep / Materials:  Position: Prone  Prep solution: DuraPrep (Iodine Povacrylex [0.7% available iodine] and Isopropyl Alcohol, 74% w/w) Prep Area: Entire Lumbosacral Region (Lower back from mid-thoracic region to end of tailbone and from flank to flank.) Materials:  Tray: RFA (Radiofrequency) tray Needle(s):  Type: RFA (Teflon-coated radiofrequency ablation needles) Gauge (G): 20  Length: Long (15cm) Qty: 5  Post-procedure evaluation    Type: Lumbar Facet, Medial Branch Radiofrequency Ablation (RFA) #2  Laterality: Right  Level: L2, L3, L4, L5, & S1 Medial Branch Level(s). These levels will denervate the L3-4, L4-5 and L5-S1 lumbar facet joints.  Imaging: Fluoroscopic guidance Anesthesia: Local anesthesia (1-2% Lidocaine) Anxiolysis: IV Versed         Sedation: Moderate conscious sedation. DOS: 06/15/2021  Performed by: Gaspar Cola, MD  Purpose: Therapeutic/Palliative Indications: Low back pain severe enough to impact quality of life or function.  Pain Score: Pre-procedure: 9 /10 Post-procedure: 0-No pain/10     Effectiveness:  Initial hour after procedure: 100 %. Subsequent 4-6 hours post-procedure: 100 %. Analgesia past initial 6 hours: 100 %. Ongoing improvement:  Analgesic: The patient indicates currently having a 100% relief of his right-sided low back pain after the radiofrequency ablation. Function:  Mr. Colarusso reports improvement in function ROM: Mr. Skibicki reports improvement in  ROM  Note: The patient refers not having taken the postprocedure pain medication that we ordered for him after the right-sided radiofrequency ablation.  Pre-op H&P Assessment:  Mr. Buchan is a 72 y.o. (year old), male patient, seen today for interventional treatment. He  has a past surgical history that includes Appendectomy; Nasal septum surgery; Tonsillectomy; Testicle surgery; Cardiac catheterization; Back surgery; Cataract extraction w/PHACO (Right, 10/20/2020); and Cataract extraction w/PHACO (Left, 11/03/2020). Mr. Curley has a current medication list which includes the following prescription(s): acetaminophen, allopurinol, ascorbic acid, chlorthalidone, cholecalciferol, cyclobenzaprine, epinephrine, famotidine, finasteride, fluticasone, gabapentin, glipizide, hydrocortisone, ketoconazole, losartan, potassium chloride sa, propranolol, rivaroxaban, rosuvastatin, topiramate, and tramadol, and the following Facility-Administered Medications: fentanyl. His primarily concern today is the Back Pain (Left lumbar )  Initial Vital Signs:  Pulse/HCG Rate: (!) 58ECG Heart Rate: (!) 58 Temp: 98.5 F (36.9 C) Resp: 16 BP: (!) 150/87 SpO2: 98 %  BMI: Estimated body mass index is 36.26 kg/m as calculated from the following:   Height as of this encounter: 5\' 11"  (1.803 m).   Weight as of this encounter: 260 lb (117.9 kg).  Risk Assessment: Allergies: Reviewed. He is allergic to penicillins, amlodipine, amlodipine besylate, atorvastatin, erenumab-aooe, lisinopril, penicillin g, and simvastatin.  Allergy Precautions: None required Coagulopathies: Reviewed. None identified.  Blood-thinner therapy: None at this time Active Infection(s): Reviewed. None identified. Mr. Carrigan is afebrile  Site Confirmation: Mr. Moening was asked to confirm the procedure and laterality before marking the site Procedure checklist:  Completed Consent: Before the procedure and under the influence of no sedative(s), amnesic(s), or anxiolytics, the patient was informed of the treatment options, risks and possible complications. To fulfill our ethical and legal obligations, as recommended by the American Medical Association's Code of Ethics, I have informed the patient of my clinical impression; the nature and purpose of the treatment or procedure; the risks, benefits, and possible complications of the intervention; the alternatives, including doing nothing; the risk(s) and benefit(s) of the alternative treatment(s) or procedure(s); and the risk(s) and benefit(s) of doing nothing. The patient was provided information about the general risks and possible complications associated with the procedure. These may include, but are not limited to: failure to achieve desired goals, infection, bleeding, organ or nerve damage, allergic reactions, paralysis, and death. In addition, the patient was informed of those risks and complications associated to Spine-related procedures, such as failure to decrease pain; infection (i.e.: Meningitis, epidural or intraspinal abscess); bleeding (i.e.: epidural hematoma, subarachnoid hemorrhage, or any other type of intraspinal or peri-dural bleeding); organ or nerve damage (i.e.: Any type of peripheral nerve, nerve root, or spinal cord injury) with subsequent damage to sensory, motor, and/or autonomic systems, resulting in permanent pain, numbness, and/or weakness of one or several areas of the body; allergic reactions; (i.e.: anaphylactic reaction); and/or death. Furthermore, the patient was informed of those risks and complications associated with the medications. These include, but are not limited to: allergic reactions (i.e.: anaphylactic or anaphylactoid reaction(s)); adrenal axis suppression; blood sugar elevation that in diabetics may result in ketoacidosis or comma; water retention that in patients with history  of congestive heart failure may result in shortness of breath, pulmonary edema, and decompensation with resultant heart failure; weight gain; swelling or edema; medication-induced neural toxicity; particulate matter embolism and blood vessel occlusion with resultant organ, and/or nervous system infarction; and/or aseptic necrosis of one or more joints. Finally, the patient was informed that Medicine is not an exact science; therefore, there is also the possibility of unforeseen or unpredictable  risks and/or possible complications that may result in a catastrophic outcome. The patient indicated having understood very clearly. We have given the patient no guarantees and we have made no promises. Enough time was given to the patient to ask questions, all of which were answered to the patient's satisfaction. Mr. Kuyper has indicated that he wanted to continue with the procedure. Attestation: I, the ordering provider, attest that I have discussed with the patient the benefits, risks, side-effects, alternatives, likelihood of achieving goals, and potential problems during recovery for the procedure that I have provided informed consent. Date   Time: 06/29/2021  8:17 AM  Pre-Procedure Preparation:  Monitoring: As per clinic protocol. Respiration, ETCO2, SpO2, BP, heart rate and rhythm monitor placed and checked for adequate function Safety Precautions: Patient was assessed for positional comfort and pressure points before starting the procedure. Time-out: I initiated and conducted the "Time-out" before starting the procedure, as per protocol. The patient was asked to participate by confirming the accuracy of the "Time Out" information. Verification of the correct person, site, and procedure were performed and confirmed by me, the nursing staff, and the patient. "Time-out" conducted as per Joint Commission's Universal Protocol (UP.01.01.01). Time: 8144  Description of Procedure:          Laterality:  Left Levels:  L2, L3, L4, L5, & S1 Medial Branch Level(s), at the L3-4 and L5-S1 lumbar facet joints. Safety Precautions: Aspiration looking for blood return was conducted prior to all injections. At no point did we inject any substances, as a needle was being advanced. Before injecting, the patient was told to immediately notify me if he was experiencing any new onset of "ringing in the ears, or metallic taste in the mouth". No attempts were made at seeking any paresthesias. Safe injection practices and needle disposal techniques used. Medications properly checked for expiration dates. SDV (single dose vial) medications used. After the completion of the procedure, all disposable equipment used was discarded in the proper designated medical waste containers. Local Anesthesia: Protocol guidelines were followed. The patient was positioned over the fluoroscopy table. The area was prepped in the usual manner. The time-out was completed. The target area was identified using fluoroscopy. A 12-in long, straight, sterile hemostat was used with fluoroscopic guidance to locate the targets for each level blocked. Once located, the skin was marked with an approved surgical skin marker. Once all sites were marked, the skin (epidermis, dermis, and hypodermis), as well as deeper tissues (fat, connective tissue and muscle) were infiltrated with a small amount of a short-acting local anesthetic, loaded on a 10cc syringe with a 25G, 1.5-in  Needle. An appropriate amount of time was allowed for local anesthetics to take effect before proceeding to the next step. Technical description of process:  Radiofrequency Ablation (RFA) L2 Medial Branch Nerve RFA: The target area for the L2 medial branch is at the junction of the postero-lateral aspect of the superior articular process and the superior, posterior, and medial edge of the transverse process of L3. Under fluoroscopic guidance, a Radiofrequency needle was inserted until  contact was made with os over the superior postero-lateral aspect of the pedicular shadow (target area). Sensory and motor testing was conducted to properly adjust the position of the needle. Once satisfactory placement of the needle was achieved, the numbing solution was slowly injected after negative aspiration for blood. 2.0 mL of the nerve block solution was injected without difficulty or complication. After waiting for at least 3 minutes, the ablation was performed. Once completed,  the needle was removed intact. L3 Medial Branch Nerve RFA: The target area for the L3 medial branch is at the junction of the postero-lateral aspect of the superior articular process and the superior, posterior, and medial edge of the transverse process of L4. Under fluoroscopic guidance, a Radiofrequency needle was inserted until contact was made with os over the superior postero-lateral aspect of the pedicular shadow (target area). Sensory and motor testing was conducted to properly adjust the position of the needle. Once satisfactory placement of the needle was achieved, the numbing solution was slowly injected after negative aspiration for blood. 2.0 mL of the nerve block solution was injected without difficulty or complication. After waiting for at least 3 minutes, the ablation was performed. Once completed, the needle was removed intact. L4 Medial Branch Nerve RFA: The target area for the L4 medial branch is at the junction of the postero-lateral aspect of the superior articular process and the superior, posterior, and medial edge of the transverse process of L5. Under fluoroscopic guidance, a Radiofrequency needle was inserted until contact was made with os over the superior postero-lateral aspect of the pedicular shadow (target area). Sensory and motor testing was conducted to properly adjust the position of the needle. Once satisfactory placement of the needle was achieved, the numbing solution was slowly injected after  negative aspiration for blood. 2.0 mL of the nerve block solution was injected without difficulty or complication. After waiting for at least 3 minutes, the ablation was performed. Once completed, the needle was removed intact. L5 Medial Branch Nerve RFA: The target area for the L5 medial branch is at the junction of the postero-lateral aspect of the superior articular process of S1 and the superior, posterior, and medial edge of the sacral ala. Under fluoroscopic guidance, a Radiofrequency needle was inserted until contact was made with os over the superior postero-lateral aspect of the pedicular shadow (target area). Sensory and motor testing was conducted to properly adjust the position of the needle. Once satisfactory placement of the needle was achieved, the numbing solution was slowly injected after negative aspiration for blood. 2.0 mL of the nerve block solution was injected without difficulty or complication. After waiting for at least 3 minutes, the ablation was performed. Once completed, the needle was removed intact. S1 Medial Branch Nerve RFA: The target area for the S1 medial branch is located inferior to the junction of the S1 superior articular process and the L5 inferior articular process, posterior, inferior, and lateral to the 6 o'clock position of the L5-S1 facet joint, just superior to the S1 posterior foramen. Under fluoroscopic guidance, the Radiofrequency needle was advanced until contact was made with os over the Target area. Sensory and motor testing was conducted to properly adjust the position of the needle. Once satisfactory placement of the needle was achieved, the numbing solution was slowly injected after negative aspiration for blood. 2.0 mL of the nerve block solution was injected without difficulty or complication. After waiting for at least 3 minutes, the ablation was performed. Once completed, the needle was removed intact. Radiofrequency lesioning (ablation):  Radiofrequency  Generator: Medtronic AccurianTM AG 1000 RF Generator Sensory Stimulation Parameters: 50 Hz was used to locate & identify the nerve, making sure that the needle was positioned such that there was no sensory stimulation below 0.3 V or above 0.7 V. Motor Stimulation Parameters: 2 Hz was used to evaluate the motor component. Care was taken not to lesion any nerves that demonstrated motor stimulation of the lower extremities at  an output of less than 2.5 times that of the sensory threshold, or a maximum of 2.0 V. Lesioning Technique Parameters: Standard Radiofrequency settings. (Not bipolar or pulsed.) Temperature Settings: 80 degrees C Lesioning time: 60 seconds Intra-operative Compliance: Compliant  Once the entire procedure was completed, the treated area was cleaned, making sure to leave some of the prepping solution back to take advantage of its long term bactericidal properties.    Illustration of the posterior view of the lumbar spine and the posterior neural structures. Laminae of L2 through S1 are labeled. DPRL5, dorsal primary ramus of L5; DPRS1, dorsal primary ramus of S1; DPR3, dorsal primary ramus of L3; FJ, facet (zygapophyseal) joint L3-L4; I, inferior articular process of L4; LB1, lateral branch of dorsal primary ramus of L1; IAB, inferior articular branches from L3 medial branch (supplies L4-L5 facet joint); IBP, intermediate branch plexus; MB3, medial branch of dorsal primary ramus of L3; NR3, third lumbar nerve root; S, superior articular process of L5; SAB, superior articular branches from L4 (supplies L4-5 facet joint also); TP3, transverse process of L3.  Vitals:   06/29/21 0910 06/29/21 0915 06/29/21 0920 06/29/21 0924  BP: (!) 142/99 (!) 137/101 (!) 156/88 (!) 143/100  Pulse:      Resp: 20 20 20 17   Temp:      TempSrc:      SpO2: 96% 97% 95% 98%  Weight:      Height:       Start Time: 0855 hrs. End Time: 0924 hrs.  Imaging Guidance (Spinal):          Type of Imaging  Technique: Fluoroscopy Guidance (Spinal) Indication(s): Assistance in needle guidance and placement for procedures requiring needle placement in or near specific anatomical locations not easily accessible without such assistance. Exposure Time: Please see nurses notes. Contrast: None used. Fluoroscopic Guidance: I was personally present during the use of fluoroscopy. "Tunnel Vision Technique" used to obtain the best possible view of the target area. Parallax error corrected before commencing the procedure. "Direction-depth-direction" technique used to introduce the needle under continuous pulsed fluoroscopy. Once target was reached, antero-posterior, oblique, and lateral fluoroscopic projection used confirm needle placement in all planes. Images permanently stored in EMR. Interpretation: No contrast injected. I personally interpreted the imaging intraoperatively. Adequate needle placement confirmed in multiple planes. Permanent images saved into the patient's record.  Antibiotic Prophylaxis:   Anti-infectives (From admission, onward)    None      Indication(s): None identified  Post-operative Assessment:  Post-procedure Vital Signs:  Pulse/HCG Rate: (!) 58(!) 59 Temp: 98.5 F (36.9 C) Resp: 17 BP: (!) 143/100 SpO2: 98 %  EBL: None  Complications: No immediate post-treatment complications observed by team, or reported by patient.  Note: The patient tolerated the entire procedure well. A repeat set of vitals were taken after the procedure and the patient was kept under observation following institutional policy, for this type of procedure. Post-procedural neurological assessment was performed, showing return to baseline, prior to discharge. The patient was provided with post-procedure discharge instructions, including a section on how to identify potential problems. Should any problems arise concerning this procedure, the patient was given instructions to immediately contact us, at any  time, without hesitation. In any case, we plan to contact the patient by telephone for a follow-up status report regarding this interventional procedure.  Comments:  No additional relevant information.  Plan of Care  Orders:  Orders Placed This Encounter  Procedures   Radiofrequency,Lumbar    Scheduling Instructions:  Side(s): Left-sided     Level: L3-4, L4-5, & L5-S1 Facets (L2, L3, L4, L5, & S1 Medial Branch Nerves)     Sedation: With Sedation.     Timeframe: Today    Order Specific Question:   Where will this procedure be performed?    Answer:   [facility]   DG [facility] C-ARM 1-60 MIN NO REPORT    Intraoperative interpretation by procedural physician at [place].    Standing Status:   Standing    Number of Occurrences:   1    Order Specific Question:   Reason for exam:    Answer:   Assistance in needle guidance and placement for procedures requiring needle placement in or near specific anatomical locations not easily accessible without such assistance.   Informed Consent Details: Physician/Practitioner Attestation; Transcribe to consent form and obtain patient signature    Nursing Order: Transcribe to consent form and obtain patient signature. Note: Always confirm laterality of pain with Mr. Matheson, before procedure.    Order Specific Question:   Physician/Practitioner attestation of informed consent for procedure/surgical case    Answer:   I, the physician/practitioner, attest that I have discussed with the patient the benefits, risks, side effects, alternatives, likelihood of achieving goals and potential problems during recovery for the procedure that I have provided informed consent.    Order Specific Question:   Procedure    Answer:   Lumbar Facet Radiofrequency Ablation    Order Specific Question:   Physician/Practitioner performing the procedure    Answer:   Dynesha Woolen A. Dossie Arbour, MD    Order Specific Question:   Indication/Reason    Answer:    Low Back Pain, with our without leg pain, due to Facet Joint Arthralgia (Joint Pain) known as Lumbar Facet Syndrome, secondary to Lumbar, and/or Lumbosacral Spondylosis (Arthritis of the Spine), without myelopathy or radiculopathy (Nerve Damage).   Care order/instruction: Please confirm that the patient has stopped the Xarelto (Rivaroxaban) x 3 days prior to procedure or surgery.    Please confirm that the patient has stopped the Xarelto (Rivaroxaban) x 3 days prior to procedure or surgery.    Standing Status:   Standing    Number of Occurrences:   1   Nursing Instructions:    Please complete this patient's postprocedure evaluation.    Scheduling Instructions:     Please complete this patient's postprocedure evaluation.   Provide equipment / supplies at bedside    "Radiofrequency Tray"; Large hemostat (x1); Small hemostat (x1); Towels (x8); 4x4 sterile sponge pack (x1) Needle type: Teflon-coated Radiofrequency Needle (Disposable   single use) Size: Long Quantity: 5    Standing Status:   Standing    Number of Occurrences:   1    Order Specific Question:   Specify    Answer:   Radiofrequency Tray   Bleeding precautions    Standing Status:   Standing    Number of Occurrences:   1   Chronic Opioid Analgesic:  No opioid analgesics prescribed by our practice. Highest recorded MME/day: 26.67 mg/day MME/day: 0 mg/day   Medications ordered for procedure: Meds ordered this encounter  Medications   lidocaine (XYLOCAINE) 2 % (with pres) injection 400 mg   lactated ringers infusion 1,000 mL   midazolam (VERSED) 5 MG/5ML injection 0.5-2 mg    Make sure Flumazenil is available in the pyxis when using this medication. If oversedation occurs, administer 0.2 mg IV over 15 sec. If after 45 sec no response, administer  0.2 mg again over 1 min; may repeat at 1 min intervals; not to exceed 4 doses (1 mg)   fentaNYL (SUBLIMAZE) injection 25-50 mcg    Make sure Narcan is available in the pyxis when using  this medication. In the event of respiratory depression (RR< 8/min): Titrate NARCAN (naloxone) in increments of 0.1 to 0.2 mg IV at 2-3 minute intervals, until desired degree of reversal.   ropivacaine (PF) 2 mg/mL (0.2%) (NAROPIN) injection 9 mL   triamcinolone acetonide (KENALOG-40) injection 40 mg   DISCONTD: HYDROcodone-acetaminophen (NORCO/VICODIN) 5-325 MG tablet    Sig: Take 1 tablet by mouth every 8 (eight) hours as needed for up to 7 days for severe pain. Must last 7 days.    Dispense:  21 tablet    Refill:  0    For acute post-operative pain. Not to be refilled. Must last 7 days.   DISCONTD: HYDROcodone-acetaminophen (NORCO/VICODIN) 5-325 MG tablet    Sig: Take 1 tablet by mouth every 8 (eight) hours as needed for up to 7 days for severe pain. Must last for 7 days.    Dispense:  21 tablet    Refill:  0    For acute post-operative pain. Not to be refilled. Must last 7 days.   Medications administered: We administered lidocaine, lactated ringers, midazolam, fentaNYL, ropivacaine (PF) 2 mg/mL (0.2%), and triamcinolone acetonide.  See the medical record for exact dosing, route, and time of administration.  Follow-up plan:   No follow-ups on file.       Interventional Therapies  Risk   Complexity Considerations:   NOTE: Xarelto Anticoagulation (Stop: 3 days   Restart: 6 hours)   Planned   Pending:   Therapeutic left lumbar facet medial branch RFA #2 (06/29/2021)    Under consideration:   Possible right opturator + femoral NB  Possible right opturator + femoral nerve RFA  Possible right SI joint RFA  Possible right lumbar facet RFA (the patient indicates having had approximately 5 prior RFA)   Completed:   Therapeutic right IA hip joint injection x2 (04/30/2019) (100/100/50/90-100) Diagnostic bilateral SI joint block x2 (03/10/2020) (50/50/100 x 5 wks/0)  Diagnostic bilateral lumbar facet MBB x1 (05/07/2020) (100/100/100 x1 week/>75)  Therapeutic left lumbar facet RFA x1  (05/28/2020) (100/100/90 5 x 2 weeks/95)  Therapeutic right lumbar facet RFA x2 (06/15/2021) (100/100/100/100% ongoing 6 weeks after procedure)    Palliative options:   Palliative right IA hip joint injection  Palliative bilateral SI joint block     Recent Visits Date Type Provider Dept  06/15/21 Procedure visit Milinda Pointer, MD [facility]  06/02/21 Office Visit Milinda Pointer, MD [facility]  Showing recent visits within past 90 days and meeting all other requirements Today's Visits Date Type Provider Dept  06/29/21 Procedure visit Milinda Pointer, MD [facility]  Showing today's visits and meeting all other requirements Future Appointments No visits were found meeting these conditions. Showing future appointments within next 90 days and meeting all other requirements  Disposition: Discharge home  Discharge (Date   Time): 06/29/2021; 0945 hrs.   Primary Care Physician: [facility] [place] Location: [facility] Note by: Gaspar Cola, MD Date: 06/29/2021; Time: 12:25 PM  Disclaimer:  Medicine is not an Chief Strategy Officer. The only guarantee in medicine is that nothing is guaranteed. It is important to note that the decision to proceed with this intervention was based on the information collected from the patient. The Data and  conclusions were drawn from the patient's questionnaire, the interview, and the physical examination. Because the information was provided in large part by the patient, it cannot be guaranteed that it has not been purposely or unconsciously manipulated. Every effort has been made to obtain as much relevant data as possible for this evaluation. It is important to note that the conclusions that lead to this procedure are derived in large part from the available data. Always take into account that the treatment will also be dependent on availability of resources and existing  treatment guidelines, considered by other Pain Management Practitioners as being common knowledge and practice, at the time of the intervention. For Medico-Legal purposes, it is also important to point out that variation in procedural techniques and pharmacological choices are the acceptable norm. The indications, contraindications, technique, and results of the above procedure should only be interpreted and judged by a Board-Certified Interventional Pain Specialist with extensive familiarity and expertise in the same exact procedure and technique.

## 2021-06-30 ENCOUNTER — Telehealth: Payer: Self-pay | Admitting: *Deleted

## 2021-06-30 NOTE — Telephone Encounter (Signed)
Attempted to call for post procedure follow-up. Unable to leave a message, mailbox is full.

## 2021-07-02 ENCOUNTER — Other Ambulatory Visit: Payer: Self-pay | Admitting: Unknown Physician Specialty

## 2021-07-02 DIAGNOSIS — D352 Benign neoplasm of pituitary gland: Secondary | ICD-10-CM

## 2021-07-14 ENCOUNTER — Ambulatory Visit
Admission: RE | Admit: 2021-07-14 | Discharge: 2021-07-14 | Disposition: A | Discharge status: Home / Self Care | Payer: No Typology Code available for payment source | Source: Ambulatory Visit | Attending: Unknown Physician Specialty | Admitting: Unknown Physician Specialty

## 2021-07-14 ENCOUNTER — Other Ambulatory Visit: Payer: Self-pay

## 2021-07-14 DIAGNOSIS — D352 Benign neoplasm of pituitary gland: Secondary | ICD-10-CM | POA: Diagnosis not present

## 2021-07-14 DIAGNOSIS — G319 Degenerative disease of nervous system, unspecified: Secondary | ICD-10-CM | POA: Diagnosis not present

## 2021-07-14 MED ORDER — GADOBUTROL 1 MMOL/ML IV SOLN
10.0000 mL | Freq: Once | INTRAVENOUS | Status: AC | PRN
  Administered 2021-07-14: 10 mL via INTRAVENOUS

## 2021-07-29 ENCOUNTER — Ambulatory Visit: Payer: Medicare Other | Attending: Pain Medicine | Admitting: Pain Medicine

## 2021-07-29 ENCOUNTER — Other Ambulatory Visit: Payer: Self-pay

## 2021-07-29 DIAGNOSIS — G8929 Other chronic pain: Secondary | ICD-10-CM | POA: Diagnosis not present

## 2021-07-29 DIAGNOSIS — M25551 Pain in right hip: Secondary | ICD-10-CM

## 2021-07-29 DIAGNOSIS — G894 Chronic pain syndrome: Secondary | ICD-10-CM | POA: Diagnosis not present

## 2021-07-29 DIAGNOSIS — M545 Low back pain, unspecified: Secondary | ICD-10-CM | POA: Diagnosis not present

## 2021-07-29 DIAGNOSIS — M47816 Spondylosis without myelopathy or radiculopathy, lumbar region: Secondary | ICD-10-CM | POA: Diagnosis not present

## 2021-07-29 DIAGNOSIS — M25552 Pain in left hip: Secondary | ICD-10-CM

## 2021-07-29 NOTE — Progress Notes (Signed)
Patient: [name] Whitaker  Service Category: E/M  Provider: Gaspar Cola, MD  DOB: March 30, 1951  DOS: 07/29/2021  Location: Office  MRN: [id number]  Setting: Ambulatory outpatient  Referring Provider: [facility]  Type: Established Patient  Specialty: Interventional Pain Management  PCP: [name] Whitaker  Location: Remote location  Delivery: TeleHealth     Virtual Encounter - Pain Management PROVIDER NOTE: Information contained herein reflects review and annotations entered in association with encounter. Interpretation of such information and data should be left to medically-trained personnel. Information provided to patient can be located elsewhere in the medical record under "Patient Instructions". Document created using STT-dictation technology, any transcriptional errors that may result from process are unintentional.    Contact & Pharmacy Preferred: [phone] Home: [phone] (home) Mobile: [phone] (mobile) E-mail: [email or web address] [address] [place] [address] Phone: [phone] Fax: [phone]  [id number] [place], [place] [address] Phone: [phone] Fax: [phone]   Pre-screening  [name] Whitaker offered "in-person" vs "virtual" encounter. He indicated preferring virtual for this encounter.   Reason COVID-19*   Social distancing based on CDC and AMA recommendations.   I contacted [name] Whitaker on 07/29/2021 via telephone.      I clearly identified myself as Gaspar Cola, MD. I verified that I was speaking with the correct person using two identifiers (Name: [name] Whitaker, and date of birth: 06-22-1950).  Consent I sought verbal advanced consent from [name] Whitaker for virtual visit interactions. I informed [name] Whitaker of possible security and privacy concerns, risks, and limitations associated with  providing "not-in-person" medical evaluation and management services. I also informed [name] Whitaker of the availability of "in-person" appointments. Finally, I informed him that there would be a charge for the virtual visit and that he could be  personally, fully or partially, financially responsible for it. [name] Whitaker expressed understanding and agreed to proceed.   Historic Elements   [name] Whitaker is a 71 y.o. year old, male patient evaluated today after our last contact on 06/29/2021. [name] Whitaker  has a past medical history of Abnormal ECG (09/27/2013), Chest pain (09/27/2013), Dilated aortic root ([name] Whitaker) (09/27/2013), DVT, lower extremity ([place]) (10/27/2011), Dyspnea (06/27/2014), GERD (gastroesophageal reflux disease), Gout, History of DVT (deep vein thrombosis) (04/14/2019), History of pulmonary embolism (04/14/2019), HTN (hypertension) (10/25/2011), Hypercholesteremia, Migraine, OSA (obstructive sleep apnea), and Pre-diabetes. He also  has a past surgical history that includes Appendectomy; Nasal septum surgery; Tonsillectomy; Testicle surgery; Cardiac catheterization; Back surgery; Cataract extraction w/PHACO (Right, 10/20/2020); and Cataract extraction w/PHACO (Left, 11/03/2020). [name] Whitaker has a current medication list which includes the following prescription(s): acetaminophen, allopurinol, ascorbic acid, chlorthalidone, cholecalciferol, cyclobenzaprine, epinephrine, famotidine, finasteride, fluticasone, gabapentin, glipizide, hydrocortisone, ketoconazole, losartan, potassium chloride sa, propranolol, rivaroxaban, rosuvastatin, topiramate, and tramadol. He  reports that he quit smoking about 38 years ago. His smoking use included pipe. He has never used smokeless tobacco. He reports that he does not drink alcohol and does not use drugs. [name] Whitaker is allergic to penicillins, amlodipine, amlodipine besylate, atorvastatin, erenumab-aooe, lisinopril, penicillin g, and simvastatin.   HPI  Today, he is being  contacted for a post-procedure assessment.  Post-procedure evaluation    Type: Lumbar Facet, Medial Branch Radiofrequency Ablation (RFA) #2  Laterality: Left  Level: L2, L3, L4, L5, & S1 Medial Branch Level(s). These levels will denervate the L3-4 and L5-S1 lumbar facet joints.  Imaging: Fluoroscopic  guidance Anesthesia: Local anesthesia (1-2% Lidocaine) Anxiolysis: IV Versed         Sedation: Moderate conscious sedation. DOS: 06/29/2021  Performed by: Gaspar Cola, MD  Purpose: Therapeutic/Palliative Indications: Low back pain severe enough to impact quality of life or function. Indications: 1. Lumbar facet joint syndrome (Bilateral) (R>L)   2. Spondylosis of lumbar region without myelopathy or radiculopathy   3. Lumbar facet hypertrophy (Multilevel)    4. DDD (degenerative disc disease), lumbosacral   5. Chronic low back pain (1ry area of Pain) (Bilateral) (R>L) w/o sciatica    Chronic anticoagulation (Xarelto)    [name] Whitaker has been dealing with the above chronic pain for longer than three months and has either failed to respond, was unable to tolerate, or simply did not get enough benefit from other more conservative therapies including, but not limited to: 1. Over-the-counter medications 2. Anti-inflammatory medications 3. Muscle relaxants 4. Membrane stabilizers 5. Opioids 6. Physical therapy and/or chiropractic manipulation 7. Modalities (Heat, ice, etc.) 8. Invasive techniques such as nerve blocks. [name] Whitaker has attained more than 50% relief of the pain from a series of diagnostic injections conducted in separate occasions.  Pain Score: Pre-procedure: 8 /10 Post-procedure: 0-No pain/10    Effectiveness:  Initial hour after procedure:   100%. Subsequent 4-6 hours post-procedure:   100%. Analgesia past initial 6 hours:   100%. Ongoing improvement:  Analgesic: The patient refers that currently he is enjoying an ongoing 100% relief of his low back pain.  He  is extremely happy.  He hopes that I will last for a long time. Function: [name] Whitaker reports improvement in function ROM: [name] Whitaker reports improvement in ROM  Pharmacotherapy Assessment   Opioid Analgesic: No opioid analgesics prescribed by our practice. Highest recorded MME/day: 26.67 mg/day MME/day: 0 mg/day   Monitoring: [place] PMP: PDMP reviewed during this encounter.       Pharmacotherapy: No side-effects or adverse reactions reported. Compliance: No problems identified. Effectiveness: Clinically acceptable. Plan: Refer to "POC". UDS: No results found for: SUMMARY   Laboratory Chemistry Profile   Renal Lab Results  Component Value Date   BUN 15 02/25/2021   CREATININE 1.59 (H) 02/25/2021   BCR 9 (L) 02/25/2021   GFRAA 62 04/15/2019   GFRNONAA 54 (L) 04/15/2019    Hepatic Lab Results  Component Value Date   AST 13 02/25/2021   ALT 13 05/28/2021   ALBUMIN 4.4 02/25/2021   ALKPHOS 64 02/25/2021   LIPASE 19 03/15/2007    Electrolytes Lab Results  Component Value Date   NA 143 02/25/2021   K 3.7 02/25/2021   CL 102 02/25/2021   CALCIUM 8.9 02/25/2021   MG 2.2 04/15/2019    Bone Lab Results  Component Value Date   25OHVITD1 43 04/15/2019   25OHVITD2 <1.0 04/15/2019   25OHVITD3 43 04/15/2019    Inflammation (CRP: Acute Phase) (ESR: Chronic Phase) Lab Results  Component Value Date   CRP 2 04/15/2019   ESRSEDRATE 6 04/15/2019         Note: Above Lab results reviewed.  Imaging  MR BRAIN W WO CONTRAST CLINICAL DATA:  Pituitary adenoma.  EXAM: MRI HEAD WITHOUT AND WITH CONTRAST  TECHNIQUE: Multiplanar, multiecho pulse sequences of the brain and surrounding structures were obtained without and with intravenous contrast.  CONTRAST:  27mL GADAVIST GADOBUTROL 1 MMOL/ML IV SOLN  COMPARISON:  Head MRI 02/14/2021  FINDINGS: Brain: There is no evidence of an acute infarct, intracranial hemorrhage, midline shift, or extra-axial fluid  collection. Small  T2 hyperintensities in the cerebral white matter bilaterally are unchanged and nonspecific but compatible with mild chronic small vessel ischemic disease. Mild cerebral atrophy is within normal limits for age.  Dedicated imaging was performed through the sella turcica. A enlarged pituitary gland measuring 10 mm in height and 18 mm in transverse dimension is unchanged in size. The gland again demonstrates diffusely heterogeneous enhancement with scattered small internal foci of T2 hyperintensity. There is unchanged suprasellar extension without mass effect on the optic chiasm, and there is no evidence of cavernous sinus invasion. The infundibulum remains midline.  Vascular: Major intracranial vascular flow voids are preserved.  Skull and upper cervical spine: Unremarkable bone marrow signal.  Sinuses/Orbits: Bilateral cataract extraction. Paranasal sinuses and mastoid air cells are clear.  Other: None.  IMPRESSION: 1. 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 Unchanged suprasellar extension without mass effect on the optic chiasm or cavernous sinus invasion. 2. Mild chronic small vessel ischemic disease.  Electronically Signed   By: Logan Bores M.D.   On: 07/14/2021 17:53  Assessment  The primary encounter diagnosis was Chronic low back pain (1ry area of Pain) (Bilateral) (R>L) w/o sciatica. Diagnoses of Lumbar facet joint syndrome (Bilateral) (R>L), Chronic hip pain (Bilateral), and Chronic pain syndrome were also pertinent to this visit.  Plan of Care  Problem-specific:  No problem-specific Assessment & Plan notes found for this encounter.  Mr. Leanard Dimaio has a current medication list which includes the following long-term medication(s): allopurinol, chlorthalidone, famotidine, glipizide, losartan, potassium chloride sa, propranolol, rivaroxaban, rosuvastatin, and topiramate.  Pharmacotherapy (Medications Ordered): No  orders of the defined types were placed in this encounter.  Orders:  No orders of the defined types were placed in this encounter.  Follow-up plan:   No follow-ups on file.     Interventional Therapies  Risk   Complexity Considerations:   NOTE: Xarelto Anticoagulation (Stop: 3 days   Restart: 6 hours)   Planned   Pending:      Under consideration:   Possible right opturator + femoral NB  Possible right opturator + femoral nerve RFA    Completed:   Therapeutic right IA hip joint inj. x2 (04/30/2019) (100/100/50/90-100) Diagnostic bilateral SI joint Blk x2 (03/10/2020) (50/50/100 x 5 wks/0)  Diagnostic bilateral lumbar facet MBB x1 (05/07/2020) (100/100/100 x1 week/>75)  Therapeutic left lumbar facet RFA x2 (06/29/2021) (100/100/100/100)  Therapeutic right lumbar facet RFA x2 (06/15/2021) (100/100/100/100)    Palliative options:   Palliative right IA hip joint inj.  Palliative bilateral SI joint Blk  Therapeutic/palliative lumbar facet RFA     Recent Visits Date Type Provider Dept  06/29/21 Procedure visit Milinda Pointer, MD [facility]  06/15/21 Procedure visit Milinda Pointer, MD [facility]  06/02/21 Office Visit Milinda Pointer, MD [facility]  Showing recent visits within past 90 days and meeting all other requirements Today's Visits Date Type Provider Dept  07/29/21 Office Visit Milinda Pointer, MD [facility]  Showing today's visits and meeting all other requirements Future Appointments No visits were found meeting these conditions. Showing future appointments within next 90 days and meeting all other requirements  I discussed the assessment and treatment plan with the patient. The patient was provided an opportunity to ask questions and all were answered. The patient agreed with the plan and demonstrated an understanding of the instructions.  Patient advised to call back or seek an in-person evaluation if the  symptoms or condition worsens.  Duration  of encounter: 14 minutes.  Note by: Gaspar Cola, MD Date: 07/29/2021; Time: 3:31 PM

## 2021-08-03 ENCOUNTER — Ambulatory Visit: Payer: Medicare Other | Admitting: Pain Medicine

## 2021-08-05 ENCOUNTER — Other Ambulatory Visit: Payer: Self-pay | Admitting: Neurosurgery

## 2021-08-05 ENCOUNTER — Other Ambulatory Visit (HOSPITAL_COMMUNITY): Payer: Self-pay | Admitting: Neurosurgery

## 2021-08-12 DIAGNOSIS — G43109 Migraine with aura, not intractable, without status migrainosus: Secondary | ICD-10-CM | POA: Diagnosis not present

## 2021-08-12 DIAGNOSIS — D352 Benign neoplasm of pituitary gland: Secondary | ICD-10-CM | POA: Diagnosis not present

## 2021-08-12 DIAGNOSIS — M542 Cervicalgia: Secondary | ICD-10-CM | POA: Diagnosis not present

## 2021-08-12 DIAGNOSIS — I10 Essential (primary) hypertension: Secondary | ICD-10-CM | POA: Diagnosis not present

## 2021-08-12 DIAGNOSIS — Z9989 Dependence on other enabling machines and devices: Secondary | ICD-10-CM | POA: Diagnosis not present

## 2021-08-12 DIAGNOSIS — G4733 Obstructive sleep apnea (adult) (pediatric): Secondary | ICD-10-CM | POA: Diagnosis not present

## 2021-08-12 DIAGNOSIS — E1142 Type 2 diabetes mellitus with diabetic polyneuropathy: Secondary | ICD-10-CM | POA: Diagnosis not present

## 2021-08-12 DIAGNOSIS — F32A Depression, unspecified: Secondary | ICD-10-CM | POA: Diagnosis not present

## 2021-08-24 ENCOUNTER — Ambulatory Visit: Admission: RE | Admit: 2021-08-24 | Payer: No Typology Code available for payment source | Source: Ambulatory Visit

## 2021-08-31 DIAGNOSIS — D497 Neoplasm of unspecified behavior of endocrine glands and other parts of nervous system: Secondary | ICD-10-CM | POA: Diagnosis not present

## 2021-10-19 DIAGNOSIS — D352 Benign neoplasm of pituitary gland: Secondary | ICD-10-CM | POA: Diagnosis not present

## 2021-10-28 DIAGNOSIS — D352 Benign neoplasm of pituitary gland: Secondary | ICD-10-CM | POA: Diagnosis not present

## 2021-10-28 DIAGNOSIS — E2749 Other adrenocortical insufficiency: Secondary | ICD-10-CM | POA: Diagnosis not present

## 2021-10-28 DIAGNOSIS — E221 Hyperprolactinemia: Secondary | ICD-10-CM | POA: Diagnosis not present

## 2021-10-29 DIAGNOSIS — E221 Hyperprolactinemia: Secondary | ICD-10-CM | POA: Diagnosis not present

## 2021-10-29 DIAGNOSIS — D352 Benign neoplasm of pituitary gland: Secondary | ICD-10-CM | POA: Diagnosis not present

## 2021-10-29 DIAGNOSIS — E2749 Other adrenocortical insufficiency: Secondary | ICD-10-CM | POA: Diagnosis not present

## 2021-11-11 DIAGNOSIS — Z9989 Dependence on other enabling machines and devices: Secondary | ICD-10-CM | POA: Diagnosis not present

## 2021-11-11 DIAGNOSIS — F32A Depression, unspecified: Secondary | ICD-10-CM | POA: Diagnosis not present

## 2021-11-11 DIAGNOSIS — G4733 Obstructive sleep apnea (adult) (pediatric): Secondary | ICD-10-CM | POA: Diagnosis not present

## 2021-11-11 DIAGNOSIS — M542 Cervicalgia: Secondary | ICD-10-CM | POA: Diagnosis not present

## 2021-11-11 DIAGNOSIS — D352 Benign neoplasm of pituitary gland: Secondary | ICD-10-CM | POA: Diagnosis not present

## 2021-11-11 DIAGNOSIS — G43119 Migraine with aura, intractable, without status migrainosus: Secondary | ICD-10-CM | POA: Diagnosis not present

## 2021-11-11 DIAGNOSIS — I10 Essential (primary) hypertension: Secondary | ICD-10-CM | POA: Diagnosis not present

## 2021-11-15 DIAGNOSIS — D352 Benign neoplasm of pituitary gland: Secondary | ICD-10-CM | POA: Diagnosis not present

## 2021-11-17 DIAGNOSIS — D352 Benign neoplasm of pituitary gland: Secondary | ICD-10-CM | POA: Diagnosis not present

## 2022-01-10 ENCOUNTER — Telehealth: Payer: Self-pay | Admitting: Cardiology

## 2022-01-10 NOTE — Telephone Encounter (Signed)
Pt would like for nurses to return call regarding questions he has about is heart issues. Please advise

## 2022-01-10 NOTE — Telephone Encounter (Signed)
Called patient back. Patient wants Dr. Marlou Porch to call him and discuss his heart issues, aortic root possibly. Patient would not give any more information, he just wanted to talk to Dr. Marlou Porch. Will send to Dr. Marlou Porch.

## 2022-01-13 NOTE — Telephone Encounter (Signed)
Jerline Pain, MD  You 35 minutes ago (1:03 PM)   OK.  Please set up with appt with me to discuss.  Thanks     Called patient back with Dr. Marlou Porch message. Scheduled patient next Friday to see Dr. Marlou Porch.

## 2022-01-17 DIAGNOSIS — G43119 Migraine with aura, intractable, without status migrainosus: Secondary | ICD-10-CM | POA: Diagnosis not present

## 2022-01-17 DIAGNOSIS — E1142 Type 2 diabetes mellitus with diabetic polyneuropathy: Secondary | ICD-10-CM | POA: Diagnosis not present

## 2022-01-17 DIAGNOSIS — I10 Essential (primary) hypertension: Secondary | ICD-10-CM | POA: Diagnosis not present

## 2022-01-17 DIAGNOSIS — G4733 Obstructive sleep apnea (adult) (pediatric): Secondary | ICD-10-CM | POA: Diagnosis not present

## 2022-01-17 DIAGNOSIS — M5481 Occipital neuralgia: Secondary | ICD-10-CM | POA: Diagnosis not present

## 2022-01-17 DIAGNOSIS — D352 Benign neoplasm of pituitary gland: Secondary | ICD-10-CM | POA: Diagnosis not present

## 2022-01-17 DIAGNOSIS — Z9989 Dependence on other enabling machines and devices: Secondary | ICD-10-CM | POA: Diagnosis not present

## 2022-01-19 NOTE — Progress Notes (Unsigned)
Cardiology Office Note:    Date:  01/22/2022   ID:  [name] Whitaker, DOB August 26, 1950, MRN [id number]  PCP:  [facility] [place] Cardiologist:  Candee Furbish, MD  [place] Electrophysiologist:  None   Referring MD: [facility]   History of Present Illness:    [name] Whitaker is a 71 y.o. male here for the follow-up of dilated ascending aorta, coronary artery disease, hypertension, and hyperlipidemia.  Repeat echo was ordered to evaluate his dilated ascending aorta. On 03/05/2021 his echo showed ejection fraction 50% low normal, there was moderate dilation of aorta ascending, 47 mm.  Previously 46 mm.   Previously here for the follow-up of hypertension and coronary artery disease, dilated aorta 45 mm, hyperlipidemia, hypercoagulable state on lifelong Xarelto.  Prior DVT, PE, moderate CAD 50% lesions with no PCI on cardiac catheterization at [facility] in 2014.  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  EKG has shown diffuse T wave inversion.  At his last appointment he had been feeling okay. 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  The steroids were causing his legs to swell and he gained weight. Every now and then he felt short of breath. He was also suffering from persistent headaches.  Today: Every now and then he experiences a stabbing, central chest pain. This is occurring randomly at any time. His chest pain usually lasts for a few minutes before resolving spontaneously.   [facility]. As soon as he measures his blood pressure with his at home cuff, the readings are sent to the [place]. He presents this log; on personal review he is having higher blood pressures such as 510 systolic. Recently his regimen was changed. He is now taking candesartan 32 mg and chlorthalidone 25 mg. His losartan was discontinued.  Additionally, he is now taking 1.5 tablets of 80 mg propranolol (total 120 mg daily).  He endorses occasional ankle swelling. This is usually worse by the afternoons but will improve by the next day previously exacerbated by amlodipine.  Of note, he has been diagnosed with a pituitary adenoma. He reports no surgery is required at this time; this will continue to be monitored. He is being treated with hydrocortisone.  He denies any palpitations, or shortness of breath. No lightheadedness, syncope, orthopnea, or PND.   Past Medical History:  Diagnosis Date   Abnormal ECG 09/27/2013   Chest pain 09/27/2013   Dilated aortic root ([place]) 09/27/2013   DVT, lower extremity ([place]) 10/27/2011   Dyspnea 06/27/2014   2/5 /2016  Walked RA x 3 laps @ 185 ft each stopped due to  End of study, slow pace min sob  - PFTs 08/01/14  FEV1  2.89 (90%) ratio 81 and nl dlco     GERD (gastroesophageal reflux disease)    Gout    History of DVT (deep vein thrombosis) 04/14/2019   History of pulmonary embolism 04/14/2019   HTN (hypertension) 10/25/2011   Hypercholesteremia    Migraine    1-2x/wk   OSA (obstructive sleep apnea)    on CPAP    Pre-diabetes     Past Surgical History:  Procedure Laterality Date   APPENDECTOMY     BACK SURGERY     CARDIAC CATHETERIZATION     CATARACT EXTRACTION W/PHACO Right 10/20/2020   Procedure: CATARACT EXTRACTION PHACO AND INTRAOCULAR LENS PLACEMENT (IOC) RIGHT DIABETIC;  Surgeon: George Ina,  Gwyndolyn Saxon, MD;  [facility];  Service: Ophthalmology;  Laterality: Right;  6.11 0:45.8   CATARACT EXTRACTION W/PHACO Left 11/03/2020   Procedure: CATARACT EXTRACTION PHACO AND INTRAOCULAR LENS PLACEMENT ([place]) LEFT DIABETIC;  Surgeon: Birder Robson, MD;  Location: [facility];  Service: Ophthalmology;  Laterality: Left;  Diabetic - oral meds 3.55 00:26.2   NASAL SEPTUM SURGERY     TESTICLE SURGERY     TONSILLECTOMY      Current Medications: Current Meds  Medication  Sig   acetaminophen (TYLENOL) 325 MG tablet Take 650 mg by mouth as needed.   allopurinol (ZYLOPRIM) 300 MG tablet Take 300 mg by mouth daily.   ascorbic acid (VITAMIN C) 500 MG tablet Take 1 tablet by mouth daily.   candesartan (ATACAND) 32 MG tablet Take 32 mg by mouth daily.   chlorthalidone (HYGROTON) 50 MG tablet Take 25 mg by mouth daily.   Cholecalciferol 25 MCG (1000 UT) tablet Take by mouth.   cyclobenzaprine (FLEXERIL) 10 MG tablet Take 10 mg by mouth as needed.    EPINEPHrine 0.3 mg/0.3 mL IJ SOAJ injection Inject 0.3 mg into the muscle as needed.   famotidine (PEPCID) 20 MG tablet One at bedtime   finasteride (PROSCAR) 5 MG tablet Take 5 mg by mouth daily.   fluticasone (FLONASE) 50 MCG/ACT nasal spray Place into both nostrils daily.   gabapentin (NEURONTIN) 400 MG capsule Take 400 mg by mouth 2 (two) times daily.   hydrocortisone (CORTEF) 5 MG tablet Take 5 mg by mouth 4 (four) times daily.   ketoconazole (NIZORAL) 2 % cream Apply 1 application topically daily as needed for irritation (feet).    potassium chloride SA (K-DUR,KLOR-CON) 20 MEQ tablet Take 1 tab daily   propranolol (INDERAL) 80 MG tablet Take 120 mg by mouth daily.   propranolol (INNOPRAN XL) 80 MG 24 hr capsule TAKE ONE CAPSULE BY MOUTH EVERY DAY FOR MIGRAINE PROPHYLAXIS.   rivaroxaban (XARELTO) 20 MG TABS tablet Take 20 mg by mouth every morning.   rosuvastatin (CRESTOR) 20 MG tablet Take 1 tablet (20 mg total) by mouth daily.   Semaglutide,0.25 or 0.'5MG'$ /DOS, (OZEMPIC, 0.25 OR 0.5 MG/DOSE,) 2 MG/1.5ML SOPN Inject into the skin.   topiramate (TOPAMAX) 50 MG tablet TAKE ONE TABLET BY MOUTH TWO TIMES A DAY START AFTER COMPLETING 25 MG TABLETS   traMADol (ULTRAM) 50 MG tablet Take 50 mg by mouth every 6 (six) hours as needed for moderate pain.     Allergies:   Penicillins, Amlodipine, Amlodipine besylate, Atorvastatin, Erenumab-aooe, Lisinopril, Penicillin g, and Simvastatin   Social History   Socioeconomic  History   Marital status: Single    Spouse name: Not on file   Number of children: Not on file   Years of education: Not on file   Highest education level: Not on file  Occupational History   Occupation: Educator   Tobacco Use   Smoking status: Former    Types: Pipe    Quit date: 05/24/1983    Years since quitting: 38.6   Smokeless tobacco: Never   Tobacco comments:    smoked a pipe for 4 yrs- quit 1985  Vaping Use   Vaping Use: Never used  Substance and Sexual Activity   Alcohol use: No    Alcohol/week: 0.0 standard drinks of alcohol   Drug use: No   Sexual activity: Not Currently  Other Topics Concern   Not on file  Social History Narrative   Not on file   Social  Determinants of Health   Financial Resource Strain: Not on file  Food Insecurity: Not on file  Transportation Needs: Not on file  Physical Activity: Not on file  Stress: Not on file  Social Connections: Not on file     Family History: The patient's family history includes Clotting disorder in his brother; Hypertension in his father and mother.  ROS:   Please see the history of present illness.    (+) Stabbing chest pain (+) Bilateral ankle edema All other systems reviewed and are negative.  EKGs/Labs/Other Studies Reviewed:    The following studies were reviewed today:  Echo  03/05/2021: Sonographer Comments: Suboptimal parasternal window and patient is  morbidly obese. Image acquisition challenging due to patient body habitus.  Global longitudinal strain was attempted.  IMPRESSIONS    1. Left ventricular ejection fraction, by estimation, is 50 to 55%. The  left ventricle has low normal function. The left ventricle has no regional  wall motion abnormalities. Left ventricular diastolic parameters are  consistent with Grade I diastolic  dysfunction (impaired relaxation). Elevated left atrial pressure.   2. Right ventricular systolic function is normal. The right ventricular  size is normal.   3.  The mitral valve is normal in structure. Trivial mitral valve  regurgitation. No evidence of mitral stenosis.   4. The aortic valve is tricuspid. Aortic valve regurgitation is trivial.  No aortic stenosis is present.   5. Aortic dilatation noted. There is moderate dilatation of the ascending  aorta, measuring 47 mm.   6. The inferior vena cava is normal in size with greater than 50%  respiratory variability, suggesting right atrial pressure of 3 mmHg.   Comparison(s): EF 55%, thoracic aorta measured 63m.   Echo 02/13/2020:  1. There is moderate dilatation of the ascending aorta, measuring 46 mm.   2. Left ventricular ejection fraction, by estimation, is 55 to 60%. The  left ventricle has normal function. The left ventricle has no regional  wall motion abnormalities. There is mild left ventricular hypertrophy.  Left ventricular diastolic parameters  are consistent with Grade I diastolic dysfunction (impaired relaxation).   3. Right ventricular systolic function is normal. The right ventricular  size is normal. Tricuspid regurgitation signal is inadequate for assessing  PA pressure.   4. The mitral valve is normal in structure. No evidence of mitral valve  regurgitation. No evidence of mitral stenosis.   5. The aortic valve is tricuspid. Aortic valve regurgitation is trivial.  No aortic stenosis is present.   6. The inferior vena cava is normal in size with greater than 50%  respiratory variability, suggesting right atrial pressure of 3 mmHg.  Cardiac catheterization 07/14/2011-[facility]   -distal left main 10%, mid LAD 50%, circumflex nondominant, minor luminal irregularities. RCA normal. Left ventriculogram mildly depressed.   Right atrium 5, right ventricle 16/2, wedge 577mg.   EKG:  EKG is personally reviewed and interpreted. 01/21/2022:  Sinus rhythm. Rate 68 bpm. LAFB. 02/25/2021: Sinus bradycardia. Rate 56 bpm. Nonspecific ST/T wave changes. 01/28/2020: sinus bradycardia  rate 56 with left anterior fascicular block and diffuse T wave inversions.  Recent Labs: 02/25/2021: BUN 15; Creatinine, Ser 1.59; Hemoglobin 13.9; Platelets 196; Potassium 3.7; Sodium 143 05/28/2021: ALT 13   Recent Lipid Panel    Component Value Date/Time   CHOL 116 05/28/2021 0819   CHOL 99 11/18/2011 0356   TRIG 93 05/28/2021 0819   TRIG 73 11/18/2011 0356   HDL 29 (L) 05/28/2021 0819   HDL 23 (L)  11/18/2011 0356   CHOLHDL 4.0 05/28/2021 0819   CHOLHDL 3 11/27/2013 0909   VLDL 18.8 11/27/2013 0909   VLDL 15 11/18/2011 0356   LDLCALC 69 05/28/2021 0819   LDLCALC 61 11/18/2011 0356    Physical Exam:    VS:  BP 130/80 (BP Location: Left Arm, Patient Position: Sitting, Cuff Size: Normal)   Pulse 68   Ht '5\' 11"'$  (1.803 m)   Wt 268 lb (121.6 kg)   SpO2 94%   BMI 37.38 kg/m     Wt Readings from Last 3 Encounters:  01/21/22 268 lb (121.6 kg)  06/29/21 260 lb (117.9 kg)  06/15/21 252 lb (114.3 kg)     GEN: Well nourished, well developed in no acute distress HEENT: Normal NECK: No JVD; No carotid bruits LYMPHATICS: No lymphadenopathy CARDIAC: RRR, no murmurs, rubs, gallops RESPIRATORY:  Clear to auscultation without rales, wheezing or rhonchi  ABDOMEN: Soft, non-tender, non-distended MUSCULOSKELETAL: Mild lower extremity ankle edema; No deformity  SKIN: Warm and dry NEUROLOGIC:  Alert and oriented x 3 PSYCHIATRIC:  Normal affect   ASSESSMENT:    1. Dilated aortic root (HCC)   2. Atherosclerosis of native coronary artery of native heart without angina pectoris   3. Other chest pain   4. Primary hypertension      PLAN:    In order of problems listed above:  History of pulmonary embolism Continuing lifelong Xarelto because of history of recurrent PEs.  No changes made.   Hyperlipidemia Prior difficulty with statin intolerances.  He is doing actually quite well with the Crestor 20 mg daily.  LDL 69.   Long term (current) use of anticoagulants Continuing  Xarelto lifelong.  Creatinine 1.5 previously.   Abnormal ECG EKG shows nonspecific T wave inversion at baseline.  This is unchanged from prior.  Prior cardiac catheterization in 2014 showed moderate CAD but no PCI.  Treated medically.  No change.   Atherosclerotic heart disease of native coronary  Moderate CAD no PCI cardiac catheterization 2014.  Aggressive medical management.  Continue with current medical management.  Dilated aortic root (HCC) 47 mm on prior echocardiogram.  Going to repeat echocardiogram.  Primary hypertension Multidrug regimen, propranolol, chlorthalidone, candesartan 32 mg.  Monitored very closely by the [place].  Daily blood pressure readings are being transmitted to them.   Chest pain Prior work-ups for chest pain in the past have been reassuring.  Likely musculoskeletal/neuropathic etiology.  He has had prior several emergency room visits.   We will check a pharmacologic stress test to ensure that there is no high risk ischemia present since it has been a while since he has had a ischemic evaluation.   Follow-up:  1 year  Medication Adjustments/Labs and Tests Ordered: Current medicines are reviewed at length with the patient today.  Concerns regarding medicines are outlined above.   Orders Placed This Encounter  Procedures   MYOCARDIAL PERFUSION IMAGING   EKG 12-Lead   No orders of the defined types were placed in this encounter.  Patient Instructions  Medication Instructions:  The current medical regimen is effective;  continue present plan and medications.  *If you need a refill on your cardiac medications before your next appointment, please call your pharmacy*  Testing/Procedures: Your physician has requested that you have an echocardiogram (as scheduled). Echocardiography is a painless test that uses sound waves to create images of your heart. It provides your doctor with information about the size and shape of your heart and how well your heart's  chambers and valves are working. This procedure takes approximately one hour. There are no restrictions for this procedure.  You are being scheduled for a Lexiscan Myoview.  Please follow the instruction sheet given.  Follow-Up: At [facility], you and your health needs are our priority.  As part of our continuing mission to provide you with exceptional heart care, we have created designated Provider Care Teams.  These Care Teams include your primary Cardiologist (physician) and Advanced Practice Providers (APPs -  Physician Assistants and Nurse Practitioners) who all work together to provide you with the care you need, when you need it.  We recommend signing up for the patient portal called "MyChart".  Sign up information is provided on this After Visit Summary.  MyChart is used to connect with patients for Virtual Visits (Telemedicine).  Patients are able to view lab/test results, encounter notes, upcoming appointments, etc.  Non-urgent messages can be sent to your provider as well.   To learn more about what you can do with MyChart, go to [email or web address].    Your next appointment:   1 year(s)  The format for your next appointment:   In Person  Provider:   Dr Candee Furbish     Hypertension Medications: Propranolol Chlorthalidone Candesartan   Important Information About Sugar         I,Mathew Stumpf,acting as a scribe for Candee Furbish, MD.,have documented all relevant documentation on the behalf of Candee Furbish, MD,as directed by  Candee Furbish, MD while in the presence of Candee Furbish, MD.  I, Candee Furbish, MD, have reviewed all documentation for this visit. The documentation on 01/22/22 for the exam, diagnosis, procedures, and orders are all accurate and complete.   Signed, Candee Furbish, MD  01/22/2022 6:30 AM    [facility]

## 2022-01-21 ENCOUNTER — Ambulatory Visit: Payer: Medicare Other | Attending: Cardiology | Admitting: Cardiology

## 2022-01-21 ENCOUNTER — Encounter: Payer: Self-pay | Admitting: Cardiology

## 2022-01-21 ENCOUNTER — Encounter: Payer: Self-pay | Admitting: *Deleted

## 2022-01-21 VITALS — BP 130/80 | HR 68 | Ht 71.0 in | Wt 268.0 lb

## 2022-01-21 DIAGNOSIS — I10 Essential (primary) hypertension: Secondary | ICD-10-CM | POA: Diagnosis not present

## 2022-01-21 DIAGNOSIS — I77810 Thoracic aortic ectasia: Secondary | ICD-10-CM | POA: Diagnosis not present

## 2022-01-21 DIAGNOSIS — R0789 Other chest pain: Secondary | ICD-10-CM | POA: Diagnosis not present

## 2022-01-21 DIAGNOSIS — G720 Drug-induced myopathy: Secondary | ICD-10-CM

## 2022-01-21 DIAGNOSIS — I2510 Atherosclerotic heart disease of native coronary artery without angina pectoris: Secondary | ICD-10-CM

## 2022-01-21 DIAGNOSIS — T466X5A Adverse effect of antihyperlipidemic and antiarteriosclerotic drugs, initial encounter: Secondary | ICD-10-CM

## 2022-01-21 NOTE — Patient Instructions (Addendum)
Medication Instructions:  The current medical regimen is effective;  continue present plan and medications.  *If you need a refill on your cardiac medications before your next appointment, please call your pharmacy*  Testing/Procedures: Your physician has requested that you have an echocardiogram (as scheduled). Echocardiography is a painless test that uses sound waves to create images of your heart. It provides your doctor with information about the size and shape of your heart and how well your heart's chambers and valves are working. This procedure takes approximately one hour. There are no restrictions for this procedure.  You are being scheduled for a Lexiscan Myoview.  Please follow the instruction sheet given.  Follow-Up: At [facility], you and your health needs are our priority.  As part of our continuing mission to provide you with exceptional heart care, we have created designated Provider Care Teams.  These Care Teams include your primary Cardiologist (physician) and Advanced Practice Providers (APPs -  Physician Assistants and Nurse Practitioners) who all work together to provide you with the care you need, when you need it.  We recommend signing up for the patient portal called "MyChart".  Sign up information is provided on this After Visit Summary.  MyChart is used to connect with patients for Virtual Visits (Telemedicine).  Patients are able to view lab/test results, encounter notes, upcoming appointments, etc.  Non-urgent messages can be sent to your provider as well.   To learn more about what you can do with MyChart, go to [email or web address].    Your next appointment:   1 year(s)  The format for your next appointment:   In Person  Provider:   Dr Candee Furbish     Hypertension Medications: Propranolol Chlorthalidone Candesartan   Important Information About Sugar

## 2022-02-01 DIAGNOSIS — D352 Benign neoplasm of pituitary gland: Secondary | ICD-10-CM | POA: Diagnosis not present

## 2022-02-14 ENCOUNTER — Telehealth (HOSPITAL_COMMUNITY): Payer: Self-pay | Admitting: *Deleted

## 2022-02-14 NOTE — Telephone Encounter (Signed)
Patient given detailed instructions per Myocardial Perfusion Study Information Sheet for the test on 02/21/2022 at 10:45. Patient notified to arrive 15 minutes early and that it is imperative to arrive on time for appointment to keep from having the test rescheduled.  If you need to cancel or reschedule your appointment, please call the office within 24 hours of your appointment. . Patient verbalized understanding.[name] Whitaker '

## 2022-02-21 ENCOUNTER — Ambulatory Visit (HOSPITAL_BASED_OUTPATIENT_CLINIC_OR_DEPARTMENT_OTHER): Payer: Medicare Other

## 2022-02-21 ENCOUNTER — Ambulatory Visit (HOSPITAL_COMMUNITY): Payer: Medicare Other | Attending: Cardiology

## 2022-02-21 DIAGNOSIS — R0789 Other chest pain: Secondary | ICD-10-CM

## 2022-02-21 DIAGNOSIS — I77810 Thoracic aortic ectasia: Secondary | ICD-10-CM

## 2022-02-21 DIAGNOSIS — I2510 Atherosclerotic heart disease of native coronary artery without angina pectoris: Secondary | ICD-10-CM

## 2022-02-21 DIAGNOSIS — I351 Nonrheumatic aortic (valve) insufficiency: Secondary | ICD-10-CM | POA: Diagnosis not present

## 2022-02-21 LAB — MYOCARDIAL PERFUSION IMAGING
LV dias vol: 109 mL (ref 62–150)
LV sys vol: 52 mL
Nuc Stress EF: 52 %
Peak HR: 81 {beats}/min
Rest HR: 60 {beats}/min
Rest Nuclear Isotope Dose: 10 mCi
SDS: 2
SRS: 0
SSS: 2
ST Depression (mm): 0 mm
Stress Nuclear Isotope Dose: 3105 mCi
TID: 1.04

## 2022-02-21 LAB — ECHOCARDIOGRAM COMPLETE
Area-P 1/2: 2.59 cm2
P 1/2 time: 1015 msec
S' Lateral: 3.7 cm

## 2022-02-21 MED ORDER — TECHNETIUM TC 99M TETROFOSMIN IV KIT
10.0000 | PACK | Freq: Once | INTRAVENOUS | Status: AC | PRN
  Administered 2022-02-21: 10 via INTRAVENOUS

## 2022-02-21 MED ORDER — TECHNETIUM TC 99M TETROFOSMIN IV KIT
30.2000 | PACK | Freq: Once | INTRAVENOUS | Status: AC | PRN
  Administered 2022-02-21: 30.2 via INTRAVENOUS

## 2022-02-21 MED ORDER — REGADENOSON 0.4 MG/5ML IV SOLN
0.4000 mg | Freq: Once | INTRAVENOUS | Status: AC
  Administered 2022-02-21: 0.4 mg via INTRAVENOUS

## 2022-02-23 IMAGING — MR MR ANKLE*L* W/O CM
6 series · 40 of 40 positions shown · non-contrast
Comparison: Plain films left ankle 09/30/2020.

CLINICAL DATA: Left ankle pain since the patient sprained his ankle
in September 2020.

EXAM:
MRI OF THE LEFT ANKLE WITHOUT CONTRAST
TECHNIQUE: Multiplanar, multisequence MR imaging of the ankle was performed. No
intravenous contrast was administered.

[Series 3: PD fat-sat · axial · left · 3.0mm · 0.50mm/px · z∈[-68,+72]mm · 8 of 36 slices shown]
[im 1/36]
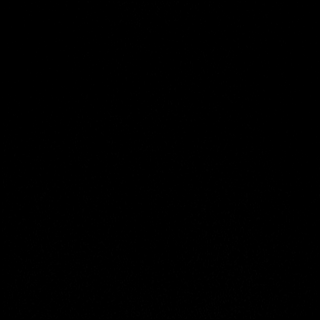
[im 6/36]
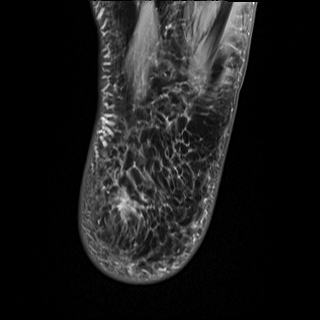
[im 11/36]
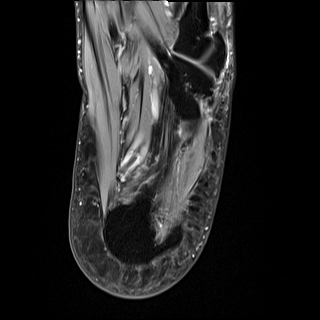
[im 16/36]
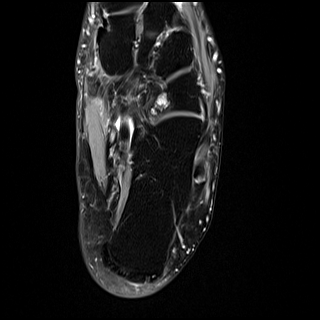
[im 21/36]
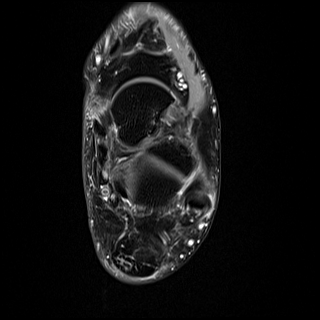
[im 26/36]
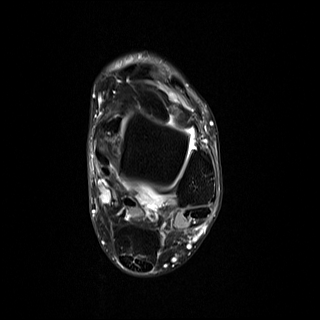
[im 31/36]
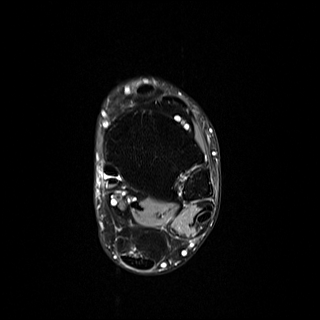
[im 36/36]
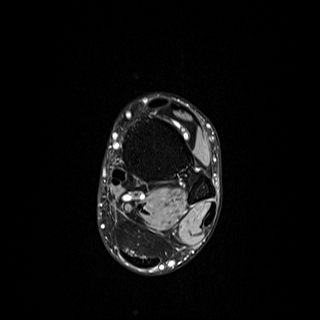

[Series 4: T2 fat-sat · axial · left · 3.0mm · 0.50mm/px · z∈[-68,+72]mm · 8 of 36 slices shown]
[im 1/36]
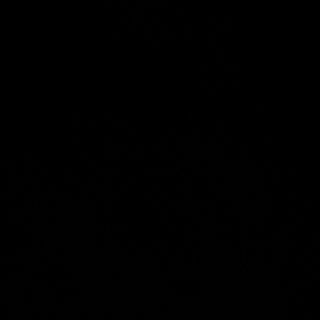
[im 6/36]
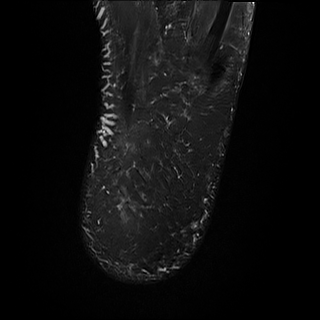
[im 11/36]
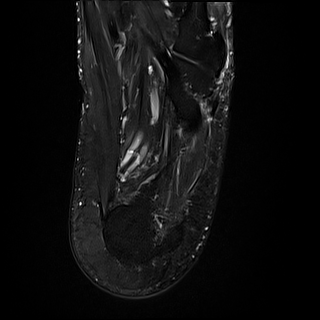
[im 16/36]
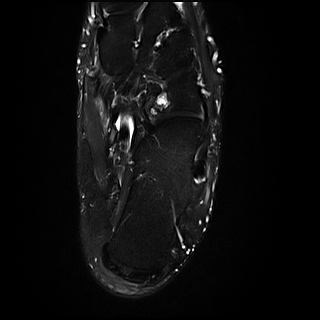
[im 21/36]
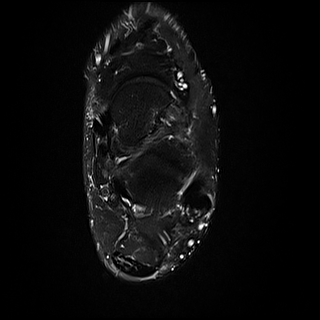
[im 26/36]
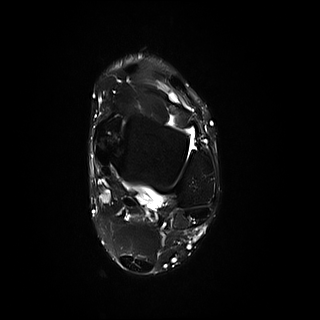
[im 31/36]
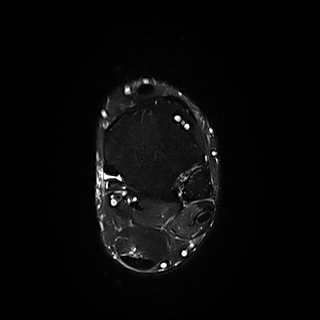
[im 36/36]
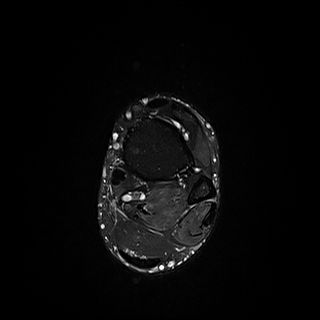

[Series 5: T2 · coronal · left · 3.0mm · 0.62mm/px · 8 of 40 slices shown (1 of 2)]
[im 1/40]
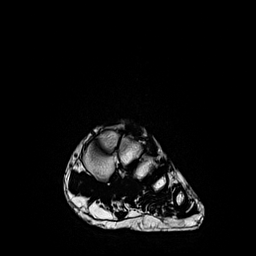
[im 6/40]
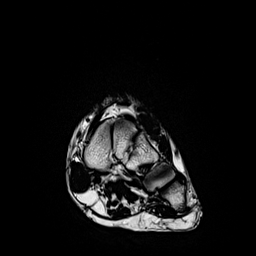
[im 12/40]
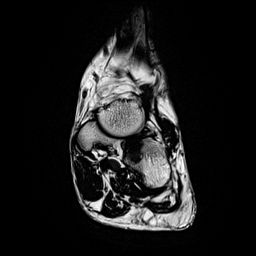
[im 17/40]
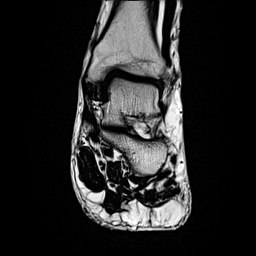
[im 23/40]
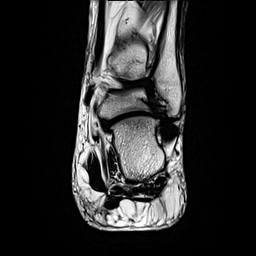
[im 28/40]
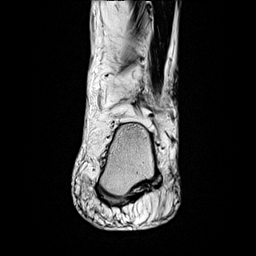
[im 34/40]
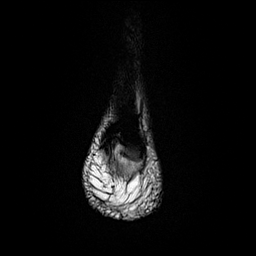
[im 40/40]
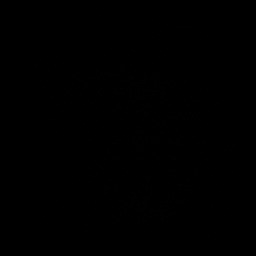

[Series 6: T2 · coronal · left · 3.0mm · 0.62mm/px · 8 of 39 slices shown (2 of 2)]
[im 1/39]
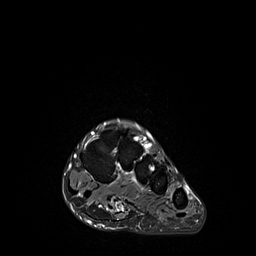
[im 6/39]
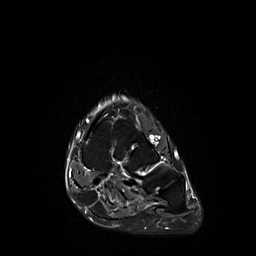
[im 11/39]
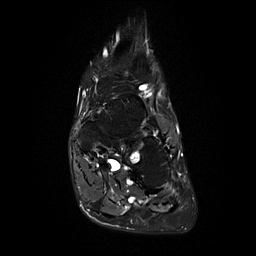
[im 17/39]
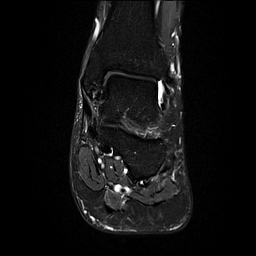
[im 22/39]
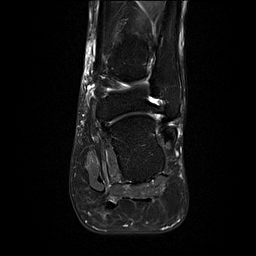
[im 28/39]
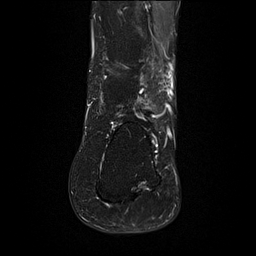
[im 33/39]
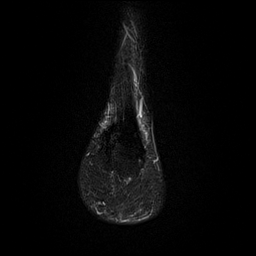
[im 39/39]
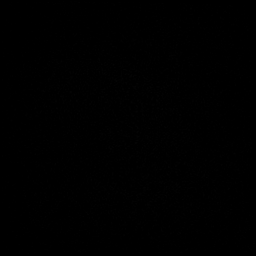

[Series 7: T1 · sagittal · left · 4.0mm · 0.70mm/px · 4 of 21 slices shown]
[im 1/21]
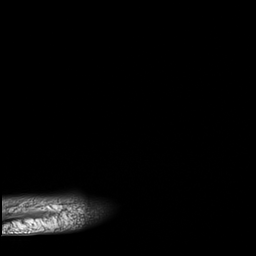
[im 7/21]
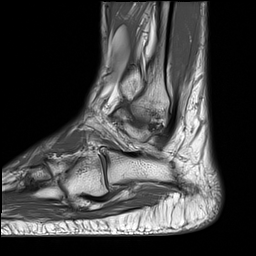
[im 14/21]
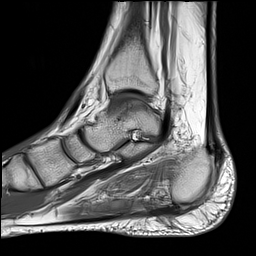
[im 21/21]
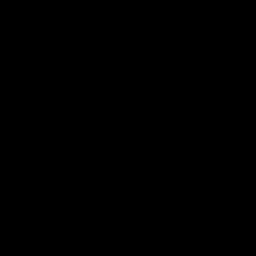

[Series 8: STIR · sagittal · left · 4.0mm · 0.35mm/px · 4 of 21 slices shown]
[im 1/21]
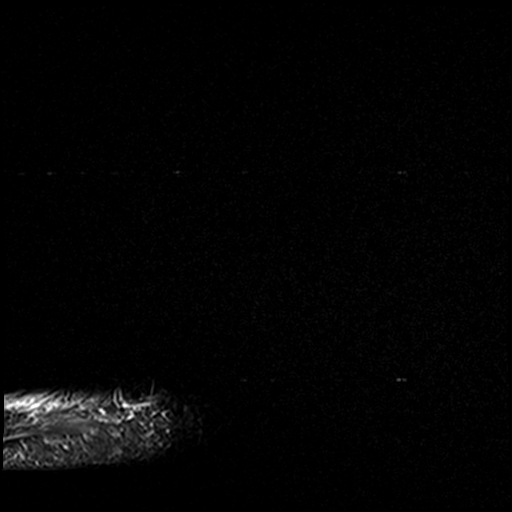
[im 7/21]
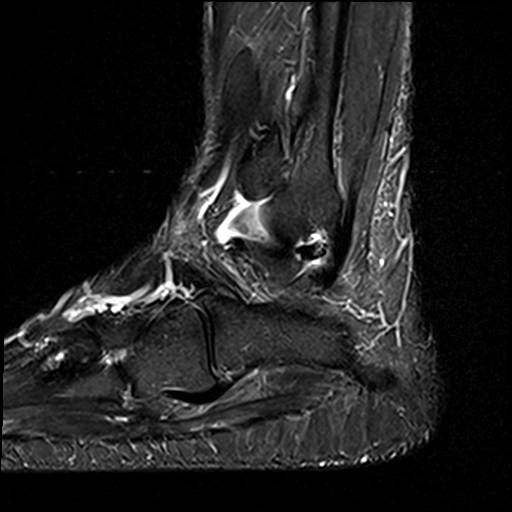
[im 14/21]
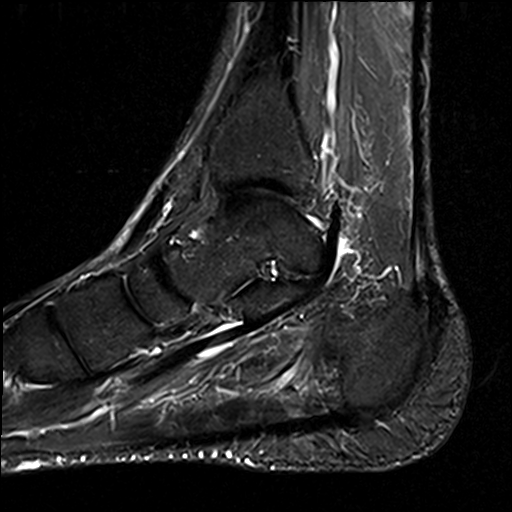
[im 21/21]
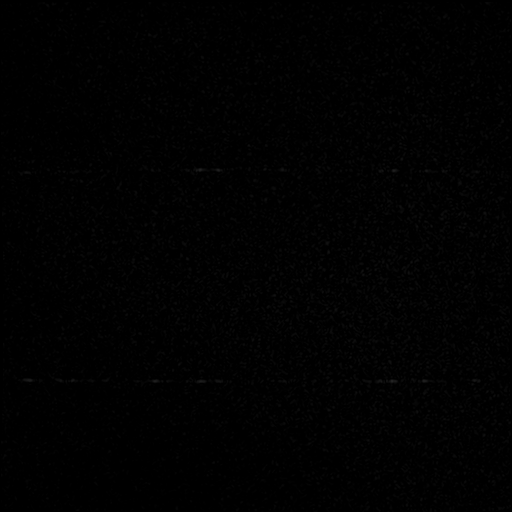

[40 of 40 positions shown; findings below may reference images not displayed]

FINDINGS: TENDONS

Peroneal: There is a longitudinal split tear of the peroneus brevis
at and just inferior to the tip of the lateral malleolus.

Posteromedial: Intact.

Anterior: Intact.

Achilles: Mild intrasubstance increased T2 signal in the distal
tendon consistent with tendinosis noted. There is some spurring off
the calcaneus at the tendon insertion.

Plantar Fascia: Negative for plantar fasciitis. Small plantar
calcaneal spur noted.

LIGAMENTS

Lateral: Intact.

Medial: Intact. There is some calcifications in the fibers of the
tibiotalar component likely due to remote injury.

CARTILAGE

Ankle Joint: Normal. No osteochondral lesion of the talar dome or
joint effusion.

Subtalar Joints/Sinus Tarsi: Normal.

Bones: No fracture, contusion or worrisome lesion.

Other: None.
IMPRESSION: Longitudinal split tear of the peroneus brevis at and just distal to
the lateral malleolus.

Mild appearing Achilles tendinosis without tear.

Small calcifications in tibiotalar component of the deltoid ligament
are most consistent with remote injury. The ligament is intact.

## 2022-03-06 ENCOUNTER — Encounter (HOSPITAL_BASED_OUTPATIENT_CLINIC_OR_DEPARTMENT_OTHER): Payer: Self-pay

## 2022-03-06 ENCOUNTER — Other Ambulatory Visit: Payer: Self-pay

## 2022-03-06 DIAGNOSIS — Z7901 Long term (current) use of anticoagulants: Secondary | ICD-10-CM | POA: Insufficient documentation

## 2022-03-06 DIAGNOSIS — Z79899 Other long term (current) drug therapy: Secondary | ICD-10-CM | POA: Diagnosis not present

## 2022-03-06 DIAGNOSIS — E119 Type 2 diabetes mellitus without complications: Secondary | ICD-10-CM | POA: Diagnosis not present

## 2022-03-06 DIAGNOSIS — Z7984 Long term (current) use of oral hypoglycemic drugs: Secondary | ICD-10-CM | POA: Insufficient documentation

## 2022-03-06 DIAGNOSIS — I10 Essential (primary) hypertension: Secondary | ICD-10-CM | POA: Diagnosis not present

## 2022-03-06 DIAGNOSIS — R519 Headache, unspecified: Secondary | ICD-10-CM | POA: Diagnosis present

## 2022-03-06 LAB — CBC
HCT: 43.2 % (ref 39.0–52.0)
Hemoglobin: 14.1 g/dL (ref 13.0–17.0)
MCH: 28.8 pg (ref 26.0–34.0)
MCHC: 32.6 g/dL (ref 30.0–36.0)
MCV: 88.2 fL (ref 80.0–100.0)
Platelets: 234 10*3/uL (ref 150–400)
RBC: 4.9 MIL/uL (ref 4.22–5.81)
RDW: 14.9 % (ref 11.5–15.5)
WBC: 6.5 10*3/uL (ref 4.0–10.5)
nRBC: 0 % (ref 0.0–0.2)

## 2022-03-06 LAB — COMPREHENSIVE METABOLIC PANEL
ALT: 24 U/L (ref 0–44)
AST: 17 U/L (ref 15–41)
Albumin: 4.2 g/dL (ref 3.5–5.0)
Alkaline Phosphatase: 59 U/L (ref 38–126)
Anion gap: 6 (ref 5–15)
BUN: 10 mg/dL (ref 8–23)
CO2: 28 mmol/L (ref 22–32)
Calcium: 9.2 mg/dL (ref 8.9–10.3)
Chloride: 107 mmol/L (ref 98–111)
Creatinine, Ser: 1.21 mg/dL (ref 0.61–1.24)
GFR, Estimated: >60 mL/min (ref >60)
Glucose, Bld: 99 mg/dL (ref 70–99)
Potassium: 3.9 mmol/L (ref 3.5–5.1)
Sodium: 141 mmol/L (ref 135–145)
Total Bilirubin: 0.4 mg/dL (ref 0.3–1.2)
Total Protein: 7.1 g/dL (ref 6.5–8.1)

## 2022-03-06 NOTE — ED Triage Notes (Signed)
Patient here POV from Home.  Endorses 191/107 BP at Home PTA. States he had his BP Medications changed in 1 Month ago.  Endorses Headache currently. No SOB. No CP.    NAD Noted during Triage. A&Ox4. GCS 15. Ambulatory.

## 2022-03-07 ENCOUNTER — Emergency Department (HOSPITAL_BASED_OUTPATIENT_CLINIC_OR_DEPARTMENT_OTHER)
Admission: EM | Admit: 2022-03-07 | Discharge: 2022-03-07 | Disposition: A | Discharge status: Home / Self Care | Payer: No Typology Code available for payment source | Attending: Emergency Medicine | Admitting: Emergency Medicine

## 2022-03-07 DIAGNOSIS — I10 Essential (primary) hypertension: Secondary | ICD-10-CM

## 2022-03-07 DIAGNOSIS — R519 Headache, unspecified: Secondary | ICD-10-CM

## 2022-03-07 MED ORDER — LACTATED RINGERS IV BOLUS
500.0000 mL | Freq: Once | INTRAVENOUS | Status: AC
  Administered 2022-03-07: 500 mL via INTRAVENOUS

## 2022-03-07 MED ORDER — DIPHENHYDRAMINE HCL 50 MG/ML IJ SOLN
25.0000 mg | Freq: Once | INTRAMUSCULAR | Status: AC
  Administered 2022-03-07: 25 mg via INTRAVENOUS
  Filled 2022-03-07: qty 1

## 2022-03-07 MED ORDER — PROCHLORPERAZINE EDISYLATE 10 MG/2ML IJ SOLN
10.0000 mg | Freq: Once | INTRAMUSCULAR | Status: AC
  Administered 2022-03-07: 10 mg via INTRAVENOUS
  Filled 2022-03-07: qty 2

## 2022-03-07 NOTE — Discharge Instructions (Signed)
Please continue to take your current blood pressure medication and continue to monitor your blood pressure once a day at home.  Work with your primary care provider to adjust your blood pressure medication appropriately.  Return to the emergency department if you have any new or concerning symptoms.

## 2022-03-07 NOTE — ED Provider Notes (Signed)
[facility] Provider Note   CSN: [id number] Arrival date & time: 03/06/22  2156     History  Chief Complaint  Patient presents with   Hypertension    Doc Mandala is a 71 y.o. male.  The history is provided by the patient.  Hypertension  He has history of hypertension, diabetes, hyperlipidemia pulmonary embolism anticoagulated on rivaroxaban and comes in because of a global headache elevated blood pressure.  He says he had a headache at home.  Headache is global and started yesterday morning.  There is associated photophobia and phonophobia but no visual disturbance or nausea or vomiting.  He does have a history of migraine's, but this does feel different.  He had not taken anything for his headache.  However, he did check his blood pressure and noted that it was very high at 191/107.  He states that he does check his blood pressure every day and the normal blood pressure reading is 140-150/85-95.   Home Medications Prior to Admission medications   Medication Sig Start Date End Date Taking? Authorizing Provider  acetaminophen (TYLENOL) 325 MG tablet Take 650 mg by mouth as needed.    [provider]  allopurinol (ZYLOPRIM) 300 MG tablet Take 300 mg by mouth daily.    [provider]  ascorbic acid (VITAMIN C) 500 MG tablet Take 1 tablet by mouth daily. 01/16/20   [provider]  candesartan (ATACAND) 32 MG tablet Take 32 mg by mouth daily.    [provider]  chlorthalidone (HYGROTON) 50 MG tablet Take 25 mg by mouth daily.    [provider]  Cholecalciferol 25 MCG (1000 UT) tablet Take by mouth. 06/27/19   [provider]  cyclobenzaprine (FLEXERIL) 10 MG tablet Take 10 mg by mouth as needed.     [provider]  EPINEPHrine 0.3 mg/0.3 mL IJ SOAJ injection Inject 0.3 mg into the muscle as needed.    [provider]  famotidine (PEPCID) 20 MG tablet One at bedtime 06/27/14   Tanda Rockers, MD  finasteride (PROSCAR) 5 MG tablet Take 5 mg by mouth daily.    [provider]  fluticasone (FLONASE) 50 MCG/ACT nasal spray Place into both nostrils daily.    [provider]  gabapentin (NEURONTIN) 400 MG capsule Take 400 mg by mouth 2 (two) times daily.    [provider]  hydrocortisone (CORTEF) 5 MG tablet Take 5 mg by mouth 4 (four) times daily.    [provider]  ketoconazole (NIZORAL) 2 % cream Apply 1 application topically daily as needed for irritation (feet).  10/07/13   [provider]  potassium chloride SA (K-DUR,KLOR-CON) 20 MEQ tablet Take 1 tab daily 09/06/13   [provider]  propranolol (INDERAL) 80 MG tablet Take 120 mg by mouth daily. 01/17/22   [provider]  propranolol (INNOPRAN XL) 80 MG 24 hr capsule TAKE ONE CAPSULE BY MOUTH EVERY DAY FOR MIGRAINE PROPHYLAXIS. 07/09/20   [provider]  rivaroxaban (XARELTO) 20 MG TABS tablet Take 20 mg by mouth every morning.    [provider]  rosuvastatin (CRESTOR) 20 MG tablet Take 1 tablet (20 mg total) by mouth daily. 02/26/21   Jerline Pain, MD  Semaglutide,0.25 or 0.'5MG'$ /DOS, (OZEMPIC, 0.25 OR 0.5 MG/DOSE,) 2 MG/1.5ML SOPN Inject into the skin.    [provider]  topiramate (TOPAMAX) 50 MG tablet TAKE ONE TABLET BY MOUTH TWO TIMES A DAY START AFTER COMPLETING 25 MG  TABLETS 08/04/20   [provider]  traMADol (ULTRAM) 50 MG tablet Take 50 mg by mouth every 6 (six) hours as needed for moderate pain.    [provider]      Allergies    Penicillins, Amlodipine, Amlodipine besylate, Atorvastatin, Erenumab-aooe, Lisinopril, Penicillin g, and Simvastatin    Review of Systems   Review of Systems  All other systems reviewed and are negative.   Physical Exam Updated Vital Signs BP (!) 180/105 (BP Location: Right Arm)   Pulse 67   Temp 98.2 F (36.8 C) (Oral)   Resp 20   Ht '5\' 11"'$  (1.803 m)   Wt 121.6  kg   SpO2 97%   BMI 37.39 kg/m  Physical Exam Vitals and nursing note reviewed.   71 year old male, resting comfortably and in no acute distress. Vital signs are significant for elevated blood pressure. Oxygen saturation is 97%, which is normal. Head is normocephalic and atraumatic. PERRLA, EOMI. Oropharynx is clear.  There is tenderness to palpation over the temporalis muscle on the right but not on the left and there is no tenderness to palpation at the insertion of the paracervical muscles. Neck is nontender and supple without adenopathy or JVD. Back is nontender and there is no CVA tenderness. Lungs are clear without rales, wheezes, or rhonchi. Chest is nontender. Heart has regular rate and rhythm without murmur. Abdomen is soft, flat, nontender. Extremities have no cyanosis or edema, full range of motion is present. Skin is warm and dry without rash. Neurologic: Mental status is normal, cranial nerves are intact, strength is 5/5 in all 4 extremities.  ED Results / Procedures / Treatments   Labs (all labs ordered are listed, but only abnormal results are displayed) Labs Reviewed  COMPREHENSIVE METABOLIC PANEL  CBC    EKG EKG Interpretation  Date/Time:  Sunday March 06 2022 22:21:46 EDT Ventricular Rate:  66 PR Interval:  158 QRS Duration: 88 QT Interval:  386 QTC Calculation: 404 R Axis:   -52 Text Interpretation: Normal sinus rhythm Left anterior fascicular block When compared with ECG of 26-May-2017 00:36, No significant change was found Reconfirmed by Delora Fuel ([id number]) on 03/07/2022 5:16:35 AM  Procedures Procedures    Medications Ordered in ED Medications  lactated ringers bolus 500 mL (has no administration in time range)  prochlorperazine (COMPAZINE) injection 10 mg (has no administration in time range)  diphenhydrAMINE (BENADRYL) injection 25 mg (has no administration in time range)    ED Course/ Medical Decision Making/ A&P                            Medical Decision Making Amount and/or Complexity of Data Reviewed Labs: ordered.  Risk Prescription drug management.   Headache which may be a muscle contraction headache versus migraine variant.  Elevated blood pressure likely exacerbated because of pain.  No evidence of stroke.  No evidence of endorgan damage.  I have ordered a migraine cocktail of lactated Ringer's solution, prochlorperazine, diphenhydramine.  I have reviewed and interpreted his laboratory test, and my interpretation is normal CBC and comprehensive metabolic panel.  I have reviewed and interpreted his electrocardiogram, and my interpretation is left anterior fascicular block unchanged from prior.  Headache is much improved following above-noted medication.  Blood pressure continues to be elevated, but not as high as previously.  I had extensive discussion with the patient and his wife about the need for ongoing blood pressure monitoring, but  there is no indication today for change in his antihypertensive regimen.  Final Clinical Impression(s) / ED Diagnoses Final diagnoses:  Bad headache  Elevated blood pressure reading with diagnosis of hypertension    Rx / DC Orders ED Discharge Orders     None         Delora Fuel, MD 97/84/78 [phone]

## 2022-03-07 NOTE — ED Notes (Signed)
580m Fluids Finished. Pt resting at this time.

## 2022-03-11 ENCOUNTER — Ambulatory Visit: Payer: No Typology Code available for payment source | Admitting: Cardiology

## 2022-03-14 DIAGNOSIS — E221 Hyperprolactinemia: Secondary | ICD-10-CM | POA: Diagnosis not present

## 2022-03-14 DIAGNOSIS — E1169 Type 2 diabetes mellitus with other specified complication: Secondary | ICD-10-CM | POA: Diagnosis not present

## 2022-03-14 DIAGNOSIS — D352 Benign neoplasm of pituitary gland: Secondary | ICD-10-CM | POA: Diagnosis not present

## 2022-03-15 DIAGNOSIS — D352 Benign neoplasm of pituitary gland: Secondary | ICD-10-CM | POA: Diagnosis not present

## 2022-03-15 DIAGNOSIS — I10 Essential (primary) hypertension: Secondary | ICD-10-CM | POA: Diagnosis not present

## 2022-03-15 DIAGNOSIS — E1169 Type 2 diabetes mellitus with other specified complication: Secondary | ICD-10-CM | POA: Diagnosis not present

## 2022-03-15 DIAGNOSIS — E2749 Other adrenocortical insufficiency: Secondary | ICD-10-CM | POA: Diagnosis not present

## 2022-03-15 DIAGNOSIS — E221 Hyperprolactinemia: Secondary | ICD-10-CM | POA: Diagnosis not present

## 2022-04-01 ENCOUNTER — Telehealth: Payer: Self-pay

## 2022-04-01 NOTE — Telephone Encounter (Signed)
     Reason for call: ED-Follow up call    Patient visited [facility] on 03/07/2022 for Headache   Telephone encounter attempt :  1st Attempt  A HIPAA compliant voice message was left requesting a return call.  Instructed patient to call back at [phone] at their earliest convenience.  [facility]  [place], [place]  Main Phone: [phone]  E-mail: [email or web address]  Website: [email or web address]

## 2022-04-05 DIAGNOSIS — I10 Essential (primary) hypertension: Secondary | ICD-10-CM | POA: Diagnosis not present

## 2022-04-18 DIAGNOSIS — I10 Essential (primary) hypertension: Secondary | ICD-10-CM | POA: Diagnosis not present

## 2022-04-18 DIAGNOSIS — G43119 Migraine with aura, intractable, without status migrainosus: Secondary | ICD-10-CM | POA: Diagnosis not present

## 2022-04-18 DIAGNOSIS — M5481 Occipital neuralgia: Secondary | ICD-10-CM | POA: Diagnosis not present

## 2022-04-18 DIAGNOSIS — H02402 Unspecified ptosis of left eyelid: Secondary | ICD-10-CM | POA: Diagnosis not present

## 2022-04-28 ENCOUNTER — Telehealth: Payer: Self-pay

## 2022-04-28 NOTE — Telephone Encounter (Signed)
Attempted to call patient for Mondays VV. No answer, unable to leave message mailbox full.

## 2022-05-01 NOTE — Patient Instructions (Addendum)
____________________________________________________________________________________________  Patient Information update  To: All of our patients.  Re: Name change.  It has been made official that our current name, "[facility]"   will soon be changed to "[facility]".   The purpose of this change is to eliminate any confusion created by the concept of our practice being a "Medication Management Pain [facility]". In the past this has led to the misconception that we treat pain primarily by the use of prescription medications.  Nothing can be farther from the truth.   Understanding PAIN MANAGEMENT: To further understand what our practice does, you first have to understand that "Pain Management" is a subspecialty that requires additional training once a physician has completed their specialty training, which can be in either Anesthesia, Neurology, Psychiatry, or Physical Medicine and Rehabilitation (PMR). Each one of these contributes to the final approach taken by each physician to the management of their patient's pain. To be a "Pain Management Specialist" you must have first completed one of the specialty trainings below.  Anesthesiologists - trained in clinical pharmacology and interventional techniques such as nerve blockade and regional as well as central neuroanatomy. They are trained to block pain before, during, and after surgical interventions.  Neurologists - trained in the diagnosis and pharmacological treatment of complex neurological conditions, such as Multiple Sclerosis, Parkinson's, spinal cord injuries, and other systemic conditions that may be associated with symptoms that may include but are not limited to pain. They tend to rely primarily on the treatment of chronic pain using prescription medications.  Psychiatrist - trained in conditions affecting the psychosocial  wellbeing of patients including but not limited to depression, anxiety, schizophrenia, personality disorders, addiction, and other substance use disorders that may be associated with chronic pain. They tend to rely primarily on the treatment of chronic pain using prescription medications.   Physical Medicine and Rehabilitation (PMR) physicians, also known as physiatrists - trained to treat a wide variety of medical conditions affecting the brain, spinal cord, nerves, bones, joints, ligaments, muscles, and tendons. Their training is primarily aimed at treating patients that have suffered injuries that have caused severe physical impairment. Their training is primarily aimed at the physical therapy and rehabilitation of those patients. They may also work alongside orthopedic surgeons or neurosurgeons using their expertise in assisting surgical patients to recover after their surgeries.  INTERVENTIONAL PAIN MANAGEMENT is sub-subspecialty of Pain Management.  Our physicians are Board-certified in Anesthesia, Pain Management, and Interventional Pain Management.  This meaning that not only have they been trained and Board-certified in their specialty of Anesthesia, and subspecialty of Pain Management, but they have also received further training in the sub-subspecialty of Interventional Pain Management, in order to become Board-certified as INTERVENTIONAL PAIN MANAGEMENT SPECIALIST.    Mission: Our goal is to use our skills in  [place] as alternatives to the chronic use of prescription opioid medications for the treatment of pain. To make this more clear, we have changed our name to reflect what we do and offer. We will continue to offer medication management assessment and recommendations, but we will not be taking over any patient's medication management.  ____________________________________________________________________________________________      ______________________________________________________________________  Preparing for your procedure  During your procedure appointment there will be: No Prescription Refills. No disability issues to discussed. No medication changes or discussions.  Instructions: Food intake: Avoid eating anything solid for at least 8 hours prior to your procedure.  Clear liquid intake: You may take clear liquids such as water up to 2 hours prior to your procedure. (No carbonated drinks. No soda.) Transportation: Unless otherwise stated by your physician, bring a driver. Morning Medicines: Except for blood thinners, take all of your other morning medications with a sip of water. Make sure to take your heart and blood pressure medicines. If your blood pressure's lower number is above 100, the case will be rescheduled. Blood thinners: If you take a blood thinner, but were not instructed to stop it, call our office [phone] and ask to talk to a nurse. Not stopping a blood thinner prior to certain procedures could lead to serious complications. Diabetics on insulin: Notify the staff so that you can be scheduled 1st case in the morning. If your diabetes requires high dose insulin, take only  of your normal insulin dose the morning of the procedure and notify the staff that you have done so. Preventing infections: Shower with an antibacterial soap the morning of your procedure.  Build-up your immune system: Take 1000 mg of Vitamin C with every meal (3 times a day) the day prior to your procedure. Antibiotics: Inform the nursing staff if you are taking any antibiotics or if you have any conditions that may require antibiotics prior to procedures. (Example: recent joint implants)   Pregnancy: If you are pregnant make sure to notify the nursing staff. Not doing so may result in injury to the fetus, including death.  Sickness: If you have a cold, fever, or any active infections, call and cancel or reschedule your  procedure. Receiving steroids while having an infection may result in complications. Arrival: You must be in the facility at least 30 minutes prior to your scheduled procedure. Tardiness: Your scheduled time is also the cutoff time. If you do not arrive at least 15 minutes prior to your procedure, you will be rescheduled.  Children: Do not bring any children with you. Make arrangements to keep them home. Dress appropriately: There is always a possibility that your clothing may get soiled. Avoid long dresses. Valuables: Do not bring any jewelry or valuables.  Reasons to call and reschedule or cancel your procedure: (Following these recommendations will minimize the risk of a serious complication.) Surgeries: Avoid having procedures within 2 weeks of any surgery. (Avoid for 2 weeks before or after any surgery). Flu Shots: Avoid having procedures within 2 weeks of a flu shots or . (Avoid for 2 weeks before or after immunizations). Barium: Avoid having a procedure within 7-10 days after having had a radiological study involving the use of radiological contrast. (Myelograms, Barium swallow or enema study). Heart attacks: Avoid any elective procedures or surgeries for the initial 6 months after a "Myocardial Infarction" (Heart Attack). Blood thinners: It is imperative that you stop these medications before procedures. Let us know if you if you take any blood thinner.  Infection: Avoid procedures during or within two weeks of an infection (including chest colds or gastrointestinal problems). Symptoms associated with infections include: Localized redness, fever, chills, night sweats or profuse sweating, burning sensation when voiding, cough, congestion, stuffiness, runny nose, sore throat, diarrhea, nausea, vomiting, cold or Flu symptoms, recent or current infections. It is specially important if the infection is over the area that we intend to treat. Heart and lung problems: Symptoms that may suggest an  active cardiopulmonary problem include: cough, chest pain, breathing difficulties or shortness of breath, dizziness, ankle swelling, uncontrolled high or unusually low blood pressure, and/or palpitations. If  you are experiencing any of these symptoms, cancel your procedure and contact your primary care physician for an evaluation.  Remember:  Regular Business hours are:  Monday to Thursday 8:00 AM to 4:00 PM  Provider's Schedule: Milinda Pointer, MD:  Procedure days: Tuesday and Thursday 7:30 AM to 4:00 PM  Gillis Santa, MD:  Procedure days: Monday and Wednesday 7:30 AM to 4:00 PM  ______________________________________________________________________    ____________________________________________________________________________________________  General Risks and Possible Complications  Patient Responsibilities: It is important that you read this as it is part of your informed consent. It is our duty to inform you of the risks and possible complications associated with treatments offered to you. It is your responsibility as a patient to read this and to ask questions about anything that is not clear or that you believe was not covered in this document.  Patient's Rights: You have the right to refuse treatment. You also have the right to change your mind, even after initially having agreed to have the treatment done. However, under this last option, if you wait until the last second to change your mind, you may be charged for the materials used up to that point.  Introduction: Medicine is not an Chief Strategy Officer. Everything in Medicine, including the lack of treatment(s), carries the potential for danger, harm, or loss (which is by definition: Risk). In Medicine, a complication is a secondary problem, condition, or disease that can aggravate an already existing one. All treatments carry the risk of possible complications. The fact that a side effects or complications occurs, does not imply  that the treatment was conducted incorrectly. It must be clearly understood that these can happen even when everything is done following the highest safety standards.  No treatment: You can choose not to proceed with the proposed treatment alternative. The "PRO(s)" would include: avoiding the risk of complications associated with the therapy. The "CON(s)" would include: not getting any of the treatment benefits. These benefits fall under one of three categories: diagnostic; therapeutic; and/or palliative. Diagnostic benefits include: getting information which can ultimately lead to improvement of the disease or symptom(s). Therapeutic benefits are those associated with the successful treatment of the disease. Finally, palliative benefits are those related to the decrease of the primary symptoms, without necessarily curing the condition (example: decreasing the pain from a flare-up of a chronic condition, such as incurable terminal cancer).  General Risks and Complications: These are associated to most interventional treatments. They can occur alone, or in combination. They fall under one of the following six (6) categories: no benefit or worsening of symptoms; bleeding; infection; nerve damage; allergic reactions; and/or death. No benefits or worsening of symptoms: In Medicine there are no guarantees, only probabilities. No healthcare provider can ever guarantee that a medical treatment will work, they can only state the probability that it may. Furthermore, there is always the possibility that the condition may worsen, either directly, or indirectly, as a consequence of the treatment. Bleeding: This is more common if the patient is taking a blood thinner, either prescription or over the counter (example: Goody Powders, Fish oil, Aspirin, Garlic, etc.), or if suffering a condition associated with impaired coagulation (example: Hemophilia, cirrhosis of the liver, low platelet counts, etc.). However, even if you  do not have one on these, it can still happen. If you have any of these conditions, or take one of these drugs, make sure to notify your treating physician. Infection: This is more common in patients with a compromised immune system,  either due to disease (example: diabetes, cancer, human immunodeficiency virus [HIV], etc.), or due to medications or treatments (example: therapies used to treat cancer and rheumatological diseases). However, even if you do not have one on these, it can still happen. If you have any of these conditions, or take one of these drugs, make sure to notify your treating physician. Nerve Damage: This is more common when the treatment is an invasive one, but it can also happen with the use of medications, such as those used in the treatment of cancer. The damage can occur to small secondary nerves, or to large primary ones, such as those in the spinal cord and brain. This damage may be temporary or permanent and it may lead to impairments that can range from temporary numbness to permanent paralysis and/or brain death. Allergic Reactions: Any time a substance or material comes in contact with our body, there is the possibility of an allergic reaction. These can range from a mild skin rash (contact dermatitis) to a severe systemic reaction (anaphylactic reaction), which can result in death. Death: In general, any medical intervention can result in death, most of the time due to an unforeseen complication. ____________________________________________________________________________________________    ____________________________________________________________________________________________  Blood Thinners  IMPORTANT NOTICE:  If you take any of these, make sure to notify the nursing staff.  Failure to do so may result in injury.  Recommended time intervals to stop and restart blood-thinners, before & after invasive procedures  Generic Name Brand Name Pre-procedure. Stop this  long before procedure. Post-procedure. Minimum waiting period before restarting.  Abciximab Reopro 15 days 2 hrs  Alteplase Activase 10 days 10 days  Anagrelide Agrylin    Apixaban Eliquis 3 days 6 hrs  Cilostazol Pletal 3 days 5 hrs  Clopidogrel Plavix 7-10 days 2 hrs  Dabigatran Pradaxa 5 days 6 hrs  Dalteparin Fragmin 24 hours 4 hrs  Dipyridamole Aggrenox 11days 2 hrs  Edoxaban Lixiana; Savaysa 3 days 2 hrs  Enoxaparin  Lovenox 24 hours 4 hrs  Eptifibatide Integrillin 8 hours 2 hrs  Fondaparinux  Arixtra 72 hours 12 hrs  Hydroxychloroquine Plaquenil 11 days   Prasugrel Effient 7-10 days 6 hrs  Reteplase Retavase 10 days 10 days  Rivaroxaban Xarelto 3 days 6 hrs  Ticagrelor Brilinta 5-7 days 6 hrs  Ticlopidine Ticlid 10-14 days 2 hrs  Tinzaparin Innohep 24 hours 4 hrs  Tirofiban Aggrastat 8 hours 2 hrs  Warfarin Coumadin 5 days 2 hrs   Other medications with blood-thinning effects  Product indications Generic (Brand) names Note  Cholesterol Lipitor Stop 4 days before procedure  Blood thinner (injectable) Heparin (LMW or LMWH Heparin) Stop 24 hours before procedure  Cancer Ibrutinib (Imbruvica) Stop 7 days before procedure  Malaria/Rheumatoid Hydroxychloroquine (Plaquenil) Stop 11 days before procedure  Thrombolytics  10 days before or after procedures   Over-the-counter (OTC) Products with blood-thinning effects  Product Common names Stop Time  Aspirin > 325 mg Goody Powders, Excedrin, etc. 11 days  Aspirin ? 81 mg  7 days  Fish oil  4 days  Garlic supplements  7 days  Ginkgo biloba  36 hours  Ginseng  24 hours  NSAIDs Ibuprofen, Naprosyn, etc. 3 days  Vitamin E  4 days   ____________________________________________________________________________________________    ____________________________________________________________________________________________  National Pain Medication Shortage  The [place] is experiencing worsening drug shortages. These have had a  negative widespread effect on patient care and treatment. Not expected to improve any time soon. Predicted to last past 2029.  Drug shortage list (generic names) Oxycodone IR Oxycodone/APAP Oxymorphone IR Hydromorphone Hydrocodone/APAP Morphine  Where is the problem?  Manufacturing and supply level.  Will this shortage affect you?  Only if you take any of the above pain medications.  How? You may be unable to fill your prescription.  Your pharmacist may offer a "partial fill" of your prescription. (Warning: Do not accept partial fills.) Read our Medication Rules and Regulation. Depending on how much medicine you are dependent on, you may experience withdrawals when unable to get the medication.  Recommendations: Consider ending your dependence on opioid pain medications. Ask your pain specialist to assist you with the process. Consider switching to a medication currently not in shortage, such as Buprenorphine. Talk to your pain specialist about this option. Consider decreasing your pain medication requirements by managing tolerance thru "Drug Holidays". This may help minimize withdrawals, should you run out of medicine. Control your pain thru the use of non-pharmacological interventional therapies.   Your prescriber: Prescribers cannot be blamed for shortages. Medication manufacturing and supply issues cannot be fixed by the prescriber.   NOTE: The prescriber is not responsible for supplying the medication, or solving supply issues. Work with your pharmacist to solve it. The patient is responsible for the decision to take or continue taking the medication and for identifying and securing a legal supply source. By law, supplying the medication is the job and responsibility of the pharmacy. The prescriber is responsible for the evaluation, monitoring, and prescribing of these medications.   Prescribers will NOT: Re-issue prescriptions that have been partially filled. Re-issue  prescriptions already sent to a pharmacy.  Re-send prescriptions to a different pharmacy because yours did not have your medication. Ask pharmacist to order the medicine or transfer it from one of their other pharmacies.  New 2023 regulation: "January 21, 2022 Revised Regulation Allows DEA-Registered Pharmacies to Transfer Electronic Prescriptions at a Patient's Request [place] Patients now have the ability to request their electronic prescription be transferred to another pharmacy without having to go back to their practitioner to initiate the request. This revised regulation went into effect on Monday, January 17, 2022.     At a patient's request, a DEA-registered retail pharmacy can now transfer an electronic prescription for a controlled substance (schedules II-V) to another DEA-registered retail pharmacy. Prior to this change, patients would have to go through their practitioner to cancel their prescription and have it re-issued to a different pharmacy. The process was taxing and time consuming for both patients and practitioners.    The Drug Enforcement Administration [facility]) published its intent to revise the process for transferring electronic prescriptions on April 10, 2020.  The final rule was published in the federal register on December 16, 2021 and went into effect 30 days later.  Under the final rule, a prescription can only be transferred once between pharmacies, and only if allowed under existing state or other applicable law. The prescription must remain in its electronic form; may not be altered in any way; and the transfer must be communicated directly between two licensed pharmacists. It's important to note, any authorized refills transfer with the original prescription, which means the entire prescription will be filled at the same pharmacy".    [email or web address]  ____________________________________________________________________________________________

## 2022-05-01 NOTE — Progress Notes (Unsigned)
Patient: [name] Whitaker  Service Category: E/M  Provider: Gaspar Cola, MD  DOB: January 27, 1951  DOS: 05/02/2022  Location: Office  MRN: [id number]  Setting: Ambulatory outpatient  Referring Provider: [facility]  Type: Established Patient  Specialty: Interventional Pain Management  PCP: [facility]  Location: Remote location  Delivery: TeleHealth     Virtual Encounter - Pain Management PROVIDER NOTE: Information contained herein reflects review and annotations entered in association with encounter. Interpretation of such information and data should be left to medically-trained personnel. Information provided to patient can be located elsewhere in the medical record under "Patient Instructions". Document created using STT-dictation technology, any transcriptional errors that may result from process are unintentional.    Contact & Pharmacy Preferred: [phone] Home: [phone] (home) Mobile: [phone] (mobile) E-mail: [email or web address], [place] - [address] [place] [address] Phone: [phone] Fax: [phone]  [id number] [place]Phone: [phone]: [phone] [id number] [place] [address] [address] [facility] [address] [place]one: [phone]: [phone] Pre-screening  [name] Whitaker offered "in-person" vs "virtual" encounter. He indicated preferring virtual for this encounter.   Reason COVID-19*  Social distancing based on CDC and AMA recommendations.   I contacted [name] Spanishn 05/02/2022 via telephone.      I clearly identified myself as [name] ColaMD. I verified that I was speaking with the correct person using two identifiers (Name: [name] Whitaker date of birth: 08/1950-12-04  Consent I sought verbal advanced consent from [name] Whitaker virtual visit interactions. I informed Mr. CuMccormacf possible security and privacy concerns, risks, and limitations associated with providing "not-in-person" medical evaluation and management services. I also informed Mr. CuAgyemanf the availability of "in-person" appointments. Finally, I informed him that there would be a charge for the virtual visit and that he could be  personally, fully or partially, financially responsible for it. Mr. CuWeesexpressed understanding and agreed to proceed.   Historic Elements   Mr. MeAndray Assefas a [age].o. year old, male patient evaluated today after our last contact on Visit date not found. Mr. CuBerohas a past medical history of Abnormal ECG (09/27/2013), Chest pain (09/27/2013), Dilated aortic root ([place](09/27/2013), DVT, lower extremity (HCOwen(10/27/2011), Dyspnea (06/27/2014), GERD (gastroesophageal reflux disease), Gout, History of DVT (deep vein thrombosis) (04/14/2019), History of pulmonary embolism (04/14/2019), HTN (hypertension) (10/25/2011), Hypercholesteremia, Migraine, OSA (obstructive sleep apnea), and Pre-diabetes. He also  has a past surgical history that includes Appendectomy; Nasal septum surgery; Tonsillectomy; Testicle surgery; Cardiac catheterization; Back surgery; Cataract extraction w/PHACO (Right, 10/20/2020); and Cataract extraction w/PHACO (Left, 11/03/2020). Mr. CuSchellhornas a current medication list which includes the following prescription(s): acetaminophen, allopurinol, ascorbic acid, candesartan, chlorthalidone, cholecalciferol, cyclobenzaprine, dulaglutide, epinephrine, famotidine, finasteride, fluticasone, gabapentin, hydrocortisone, ketoconazole, potassium chloride sa, propranolol, rivaroxaban, rosuvastatin, topiramate, propranolol, ozempic (0.25 or 0.5 mg/dose), and tramadol. He  reports that he quit smoking about 38 years ago. His smoking use included pipe. He has never used smokeless tobacco. He reports that he does not drink alcohol  and does not use drugs. Mr. CuAwbreys allergic to penicillins, amlodipine, amlodipine besylate, atorvastatin, erenumab-aooe, lisinopril, penicillin g, and simvastatin.  Estimated body mass index is 37.39 kg/m as calculated from the following:   Height as of 03/06/22: 5' 11" (1.803 m).   Weight as of  03/06/22: 268 lb 1.3 oz (121.6 kg).  HPI  Today, he is being contacted for  ***   Pharmacotherapy Assessment   Opioid Analgesic: No opioid analgesics prescribed by our practice. Highest recorded MME/day: 26.67 mg/day MME/day: 0 mg/day   Monitoring: [place] PMP: PDMP reviewed during this encounter.       Pharmacotherapy: No side-effects or adverse reactions reported. Compliance: No problems identified. Effectiveness: Clinically acceptable. Plan: Refer to "POC". UDS: No results found for: "SUMMARY" No results found for: "CBDTHCR", "D8THCCBX", "D9THCCBX"   Laboratory Chemistry Profile   Renal Lab Results  Component Value Date   BUN 10 03/06/2022   CREATININE 1.21 03/06/2022   BCR 9 (L) 02/25/2021   GFRAA 62 04/15/2019   GFRNONAA >60 03/06/2022    Hepatic Lab Results  Component Value Date   AST 17 03/06/2022   ALT 24 03/06/2022   ALBUMIN 4.2 03/06/2022   ALKPHOS 59 03/06/2022   LIPASE 19 03/15/2007    Electrolytes Lab Results  Component Value Date   NA 141 03/06/2022   K 3.9 03/06/2022   CL 107 03/06/2022   CALCIUM 9.2 03/06/2022   MG 2.2 04/15/2019    Bone Lab Results  Component Value Date   25OHVITD1 43 04/15/2019   25OHVITD2 <1.0 04/15/2019   25OHVITD3 43 04/15/2019    Inflammation (CRP: Acute Phase) (ESR: Chronic Phase) Lab Results  Component Value Date   CRP 2 04/15/2019   ESRSEDRATE 6 04/15/2019         Note: Above Lab results reviewed.  Imaging  MYOCARDIAL PERFUSION IMAGING   Findings are consistent with no prior ischemia. The study is low risk.   No ST deviation was noted.   LV perfusion is normal.   Left ventricular function is normal. Nuclear  stress EF: 52 %. The left  ventricular ejection fraction is mildly decreased (45-54%). End diastolic  cavity size is mildly enlarged. End systolic cavity size is normal.   Prior study available for comparison from 12/29/2006. There are changes  compared to prior study. The left ventricular ejection fraction has  decreased. LVEF 69%, no ischemia  Small size, mild intensity fixed inferior perfusion defect, suspect  attenuation artifact (SDS 1). LVEF 52% with basal to mid inferior  hypokinesis. This is a low risk study. Prior study in 2008 at an outside  hospital showed no ischemia with normal LVEF. ECHOCARDIOGRAM COMPLETE    ECHOCARDIOGRAM REPORT       Patient Name:   [name] Whitaker Date of Exam: 02/21/2022 Medical Rec #:  [id number]       Height:       71.0 in Accession #:    [id number]      Weight:       268.0 lb Date of Birth:  04-26-1951        BSA:          2.388 m Patient Age:    77 years        BP:           130/80 mmHg Patient Gender: M               HR:           64 bpm. Exam Location:  [place]  Procedure: 2D Echo and 3D Echo  Indications:    i77.810 Dilated Aortic Root   History:        Patient has prior history of Echocardiogram examinations, most  recent 03/05/2021. Abnormal ECG, Pulmonary embolism, renal                 disease, Signs/Symptoms:Chest Pain and Dyspnea; Risk                 Factors:Hypertension, Dyslipidemia and pre-diabetes, HLD.                 Obstructive sleep apnea-CPAP. History of pulmonary embolism.                 DVT.   Sonographer:    Marygrace Drought RCS Referring Phys: [id number]   1. Distal septal hypokinesis . Left ventricular ejection fraction, by estimation, is 50 to 55%. The left ventricle has low normal function. The left ventricle has no regional wall motion abnormalities. The left ventricular internal cavity size was  mildly dilated. There is mild left ventricular hypertrophy. Left ventricular  diastolic parameters were normal.  2. Right ventricular systolic function is normal. The right ventricular size is normal. There is normal pulmonary artery systolic pressure.  3. The mitral valve is normal in structure. Trivial mitral valve regurgitation. No evidence of mitral stenosis.  4. The aortic valve is tricuspid. There is mild calcification of the aortic valve. There is mild thickening of the aortic valve. Aortic valve regurgitation is mild. Aortic valve sclerosis is present, with no evidence of aortic valve stenosis.  5. Aortic dilatation noted. There is moderate dilatation of the ascending aorta, measuring 45 mm.  6. The inferior vena cava is normal in size with greater than 50% respiratory variability, suggesting right atrial pressure of 3 mmHg.  FINDINGS  Left Ventricle: Distal septal hypokinesis. Left ventricular ejection fraction, by estimation, is 50 to 55%. The left ventricle has low normal function. The left ventricle has no regional wall motion abnormalities. The left ventricular internal cavity  size was mildly dilated. There is mild left ventricular hypertrophy. Left ventricular diastolic parameters were normal.  Right Ventricle: The right ventricular size is normal. No increase in right ventricular wall thickness. Right ventricular systolic function is normal. There is normal pulmonary artery systolic pressure. The tricuspid regurgitant velocity is 1.59 m/s, and  with an assumed right atrial pressure of 3 mmHg, the estimated right ventricular systolic pressure is 16.3 mmHg.  Left Atrium: Left atrial size was normal in size.  Right Atrium: Right atrial size was normal in size.  Pericardium: There is no evidence of pericardial effusion.  Mitral Valve: The mitral valve is normal in structure. Trivial mitral valve regurgitation. No evidence of mitral valve stenosis.  Tricuspid Valve: The tricuspid valve is normal in structure. Tricuspid valve regurgitation is mild . No evidence  of tricuspid stenosis.  Aortic Valve: The aortic valve is tricuspid. There is mild calcification of the aortic valve. There is mild thickening of the aortic valve. Aortic valve regurgitation is mild. Aortic regurgitation PHT measures 1015 msec. Aortic valve sclerosis is  present, with no evidence of aortic valve stenosis.  Pulmonic Valve: The pulmonic valve was normal in structure. Pulmonic valve regurgitation is not visualized. No evidence of pulmonic stenosis.  Aorta: Aortic dilatation noted. There is moderate dilatation of the ascending aorta, measuring 45 mm.  Venous: The inferior vena cava is normal in size with greater than 50% respiratory variability, suggesting right atrial pressure of 3 mmHg.  IAS/Shunts: No atrial level shunt detected by color flow Doppler.    LEFT VENTRICLE PLAX 2D LVIDd:         5.00  cm   Diastology LVIDs:         3.70 cm   LV e' medial:    6.96 cm/s LV PW:         1.20 cm   LV E/e' medial:  10.0 LV IVS:        1.20 cm   LV e' lateral:   7.51 cm/s LVOT diam:     2.10 cm   LV E/e' lateral: 9.3 LV SV:         81 LV SV Index:   34 LVOT Area:     3.46 cm                            3D Volume EF:                          3D EF:        51 %                          LV EDV:       140 ml                          LV ESV:       68 ml                          LV SV:        72 ml  RIGHT VENTRICLE RV Basal diam:  2.80 cm RV S prime:     9.68 cm/s TAPSE (M-mode): 2.0 cm RVSP:           13.1 mmHg  LEFT ATRIUM             Index        RIGHT ATRIUM           Index LA diam:        3.40 cm 1.42 cm/m   RA Pressure: 3.00 mmHg LA Vol (A2C):   42.5 ml 17.80 ml/m  RA Area:     12.60 cm LA Vol (A4C):   32.9 ml 13.78 ml/m  RA Volume:   28.00 ml  11.73 ml/m LA Biplane Vol: 40.0 ml 16.75 ml/m  AORTIC VALVE LVOT Vmax:   104.00 cm/s LVOT Vmean:  65.500 cm/s LVOT VTI:    0.234 m AI PHT:      1015 msec   AORTA Ao Root diam: 3.50 cm Ao Asc diam:  4.50 cm  MITRAL  VALVE               TRICUSPID VALVE MV Area (PHT):             TR Peak grad:   10.1 mmHg MV Decel Time:             TR Vmax:        159.00 cm/s MV E velocity: 69.70 cm/s  Estimated RAP:  3.00 mmHg MV A velocity: 83.80 cm/s  RVSP:           13.1 mmHg MV E/A ratio:  0.83                            SHUNTS  Systemic VTI:  0.23 m                            Systemic Diam: 2.10 cm  Jenkins Rouge MD Electronically signed by Jenkins Rouge MD Signature Date/Time: 02/21/2022/12:09:06 PM      Final    Assessment  There were no encounter diagnoses.  Plan of Care  Problem-specific:  No problem-specific Assessment & Plan notes found for this encounter.  Mr. Wanya Bangura has a current medication list which includes the following long-term medication(s): allopurinol, candesartan, chlorthalidone, famotidine, potassium chloride sa, propranolol, rivaroxaban, rosuvastatin, and topiramate.  Pharmacotherapy (Medications Ordered): No orders of the defined types were placed in this encounter.  Orders:  No orders of the defined types were placed in this encounter.  Follow-up plan:   No follow-ups on file.     Interventional Therapies  Risk  Complexity Considerations:   NOTE: Xarelto Anticoagulation (Stop: 3 days  Restart: 6 hours)   Planned  Pending:      Under consideration:   Possible right opturator + femoral NB  Possible right opturator + femoral nerve RFA    Completed:   Therapeutic right IA hip joint inj. x2 (04/30/2019) (100/100/50/90-100) Diagnostic bilateral SI joint Blk x2 (03/10/2020) (50/50/100 x 5 wks/0)  Diagnostic bilateral lumbar facet MBB x1 (05/07/2020) (100/100/100 x1 week/>75)  Therapeutic left lumbar facet RFA x2 (06/29/2021) (100/100/100/100)  Therapeutic right lumbar facet RFA x2 (06/15/2021) (100/100/100/100)    Palliative options:   Palliative right IA hip joint inj.  Palliative bilateral SI joint Blk  Therapeutic/palliative lumbar  facet RFA      Recent Visits No visits were found meeting these conditions. Showing recent visits within past 90 days and meeting all other requirements Future Appointments Date Type Provider Dept  05/02/22 Office Visit Milinda Pointer, MD [facility]  Showing future appointments within next 90 days and meeting all other requirements  I discussed the assessment and treatment plan with the patient. The patient was provided an opportunity to ask questions and all were answered. The patient agreed with the plan and demonstrated an understanding of the instructions.  Patient advised to call back or seek an in-person evaluation if the symptoms or condition worsens.  Duration of encounter: *** minutes.  Note by: Gaspar Cola, MD Date: 05/02/2022; Time: 1:17 PM

## 2022-05-02 ENCOUNTER — Ambulatory Visit: Payer: No Typology Code available for payment source | Attending: Pain Medicine | Admitting: Pain Medicine

## 2022-05-02 DIAGNOSIS — M5137 Other intervertebral disc degeneration, lumbosacral region: Secondary | ICD-10-CM | POA: Diagnosis not present

## 2022-05-02 DIAGNOSIS — M4316 Spondylolisthesis, lumbar region: Secondary | ICD-10-CM | POA: Diagnosis not present

## 2022-05-02 DIAGNOSIS — Z7901 Long term (current) use of anticoagulants: Secondary | ICD-10-CM | POA: Diagnosis not present

## 2022-05-02 DIAGNOSIS — M47816 Spondylosis without myelopathy or radiculopathy, lumbar region: Secondary | ICD-10-CM | POA: Diagnosis not present

## 2022-05-02 DIAGNOSIS — M545 Low back pain, unspecified: Secondary | ICD-10-CM

## 2022-05-02 DIAGNOSIS — G8929 Other chronic pain: Secondary | ICD-10-CM | POA: Diagnosis not present

## 2022-05-11 DIAGNOSIS — G720 Drug-induced myopathy: Secondary | ICD-10-CM | POA: Insufficient documentation

## 2022-05-23 DIAGNOSIS — Z5181 Encounter for therapeutic drug level monitoring: Secondary | ICD-10-CM | POA: Insufficient documentation

## 2022-05-23 DIAGNOSIS — E119 Type 2 diabetes mellitus without complications: Secondary | ICD-10-CM | POA: Insufficient documentation

## 2022-05-23 DIAGNOSIS — N183 Chronic kidney disease, stage 3 unspecified: Secondary | ICD-10-CM | POA: Insufficient documentation

## 2022-05-23 DIAGNOSIS — J209 Acute bronchitis, unspecified: Secondary | ICD-10-CM | POA: Insufficient documentation

## 2022-05-23 DIAGNOSIS — Z135 Encounter for screening for eye and ear disorders: Secondary | ICD-10-CM | POA: Insufficient documentation

## 2022-05-23 DIAGNOSIS — F54 Psychological and behavioral factors associated with disorders or diseases classified elsewhere: Secondary | ICD-10-CM | POA: Insufficient documentation

## 2022-05-23 DIAGNOSIS — Z0389 Encounter for observation for other suspected diseases and conditions ruled out: Secondary | ICD-10-CM | POA: Insufficient documentation

## 2022-05-23 DIAGNOSIS — N390 Urinary tract infection, site not specified: Secondary | ICD-10-CM | POA: Insufficient documentation

## 2022-05-23 DIAGNOSIS — Z723 Lack of physical exercise: Secondary | ICD-10-CM | POA: Insufficient documentation

## 2022-05-23 DIAGNOSIS — N1831 Chronic kidney disease, stage 3a: Secondary | ICD-10-CM | POA: Insufficient documentation

## 2022-05-23 NOTE — Progress Notes (Addendum)
PROVIDER NOTE: Interpretation of information contained herein should be left to medically-trained personnel. Specific patient instructions are provided elsewhere under "Patient Instructions" section of medical record. This document was created in part using STT-dictation technology, any transcriptional errors that may result from this process are unintentional.  Patient: [name] Whitaker Type: Established DOB: May 11, 1951 MRN: [id number] PCP: [facility] [place]  Service: Procedure DOS: 05/24/2022 Setting: Ambulatory Location: Ambulatory outpatient facility Delivery: Face-to-face Provider: Gaspar Cola, MD Specialty: Interventional Pain Management Specialty designation: 09 Location: Outpatient facility Ref. Prov.: Milinda Pointer, MD    Procedure:           Type: Lumbar Facet, Medial Branch Radiofrequency Ablation (RFA) #3  Laterality: Right (-RT)  Level: L3, L4, L5, and S1 Medial Branch Level(s). These levels will denervate the L4-5 and L5-S1 lumbar facet joints.  Imaging: Fluoroscopy-guided         Anesthesia: Local anesthesia (1-2% Lidocaine) Anxiolysis: IV Versed 2.0 mg Sedation: Moderate Sedation Fentanyl 1 mL (50 mcg) DOS: 05/24/2022  Performed by: Gaspar Cola, MD  Purpose: Therapeutic/Palliative Indications: Low back pain severe enough to impact quality of life or function. Indications: 1. Lumbar facet joint syndrome (Bilateral) (R>L)   2. Spondylosis of lumbar region without myelopathy or radiculopathy   3. Lumbar facet hypertrophy (Multilevel)    4. Lumbosacral spondylosis without myelopathy   5. Grade 1 Anterolisthesis (42m) of lumbar spine of L4/L5   6. DDD (degenerative disc disease), lumbosacral   7. Chronic low back pain (1ry area of Pain) (Bilateral) (R>L) w/o sciatica   8. Chronic anticoagulation (Xarelto)    [name] Whitaker been dealing with the above chronic pain for longer than three months and has either failed to respond,  was unable to tolerate, or simply did not get enough benefit from other more conservative therapies including, but not limited to: 1. Over-the-counter medications 2. Anti-inflammatory medications 3. Muscle relaxants 4. Membrane stabilizers 5. Opioids 6. Physical therapy and/or chiropractic manipulation 7. Modalities (Heat, ice, etc.) 8. Invasive techniques such as nerve blocks. [name] Whitaker attained more than 50% relief of the pain from a series of diagnostic injections conducted in separate occasions.  Pain Score: Pre-procedure: 6 /10 Post-procedure: 0-No pain/10    Patient did not follow our preoperative instructions and did not take his blood pressure medicine this morning.  Position / Prep / Materials:  Position: Prone  Prep solution: DuraPrep (Iodine Povacrylex [0.7% available iodine] and Isopropyl Alcohol, 74% w/w) Prep Area: Entire Lumbosacral Region (Lower back from mid-thoracic region to end of tailbone and from flank to flank.) Materials:  Tray: RFA (Radiofrequency) tray Needle(s):  Type: RFA (Teflon-coated radiofrequency ablation needles) Gauge (G): 20  Length: Long (15cm) Qty: 5  Pre-op H&P Assessment:  [name] Whitaker a 72y.o. (year old), male patient, seen today for interventional treatment. He  has a past surgical history that includes Appendectomy; Nasal septum surgery; Tonsillectomy; Testicle surgery; Cardiac catheterization; Back surgery; Cataract extraction w/PHACO (Right, 10/20/2020); and Cataract extraction w/PHACO (Left, 11/03/2020). [name] Whitaker a current medication list which includes the following prescription(s): acetaminophen, allopurinol, ascorbic acid, candesartan, chlorthalidone, cholecalciferol, cyclobenzaprine, dulaglutide, epinephrine, famotidine, finasteride, fluticasone, gabapentin, hydrocodone-acetaminophen, [START ON 05/31/2022] hydrocodone-acetaminophen, hydrocortisone, ketoconazole, potassium chloride sa, propranolol, propranolol,  rivaroxaban, rosuvastatin, and topiramate, and the following Facility-Administered Medications: fentanyl, hydralazine, and pentafluoroprop-tetrafluoroeth. His primarily concern today is the Back Pain  Initial Vital Signs:  Pulse/HCG Rate: 69ECG Heart Rate: 80 Temp: 97.9 F (36.6 C) Resp: 16 BP: (!) 169/104 (retook and  notified DR N) SpO2: 100 %  BMI: Estimated body mass index is 36.26 kg/m as calculated from the following:   Height as of this encounter: '5\' 11"'$  (1.803 m).   Weight as of this encounter: 260 lb (117.9 kg).  Risk Assessment: Allergies: Reviewed. He is allergic to penicillins, amlodipine, amlodipine besylate, atorvastatin, erenumab-aooe, lisinopril, penicillin g, and simvastatin.  Allergy Precautions: None required Coagulopathies: Reviewed. None identified.  Blood-thinner therapy: None at this time Active Infection(s): Reviewed. None identified. [name] Whitaker is afebrile  Site Confirmation: [name] Whitaker was asked to confirm the procedure and laterality before marking the site Procedure checklist: Completed Consent: Before the procedure and under the influence of no sedative(s), amnesic(s), or anxiolytics, the patient was informed of the treatment options, risks and possible complications. To fulfill our ethical and legal obligations, as recommended by the American Medical Association's Code of Ethics, I have informed the patient of my clinical impression; the nature and purpose of the treatment or procedure; the risks, benefits, and possible complications of the intervention; the alternatives, including doing nothing; the risk(s) and benefit(s) of the alternative treatment(s) or procedure(s); and the risk(s) and benefit(s) of doing nothing. The patient was provided information about the general risks and possible complications associated with the procedure. These may include, but are not limited to: failure to achieve desired goals, infection, bleeding, organ or nerve damage,  allergic reactions, paralysis, and death. In addition, the patient was informed of those risks and complications associated to Spine-related procedures, such as failure to decrease pain; infection (i.e.: Meningitis, epidural or intraspinal abscess); bleeding (i.e.: epidural hematoma, subarachnoid hemorrhage, or any other type of intraspinal or peri-dural bleeding); organ or nerve damage (i.e.: Any type of peripheral nerve, nerve root, or spinal cord injury) with subsequent damage to sensory, motor, and/or autonomic systems, resulting in permanent pain, numbness, and/or weakness of one or several areas of the body; allergic reactions; (i.e.: anaphylactic reaction); and/or death. Furthermore, the patient was informed of those risks and complications associated with the medications. These include, but are not limited to: allergic reactions (i.e.: anaphylactic or anaphylactoid reaction(s)); adrenal axis suppression; blood sugar elevation that in diabetics may result in ketoacidosis or comma; water retention that in patients with history of congestive heart failure may result in shortness of breath, pulmonary edema, and decompensation with resultant heart failure; weight gain; swelling or edema; medication-induced neural toxicity; particulate matter embolism and blood vessel occlusion with resultant organ, and/or nervous system infarction; and/or aseptic necrosis of one or more joints. Finally, the patient was informed that Medicine is not an exact science; therefore, there is also the possibility of unforeseen or unpredictable risks and/or possible complications that may result in a catastrophic outcome. The patient indicated having understood very clearly. We have given the patient no guarantees and we have made no promises. Enough time was given to the patient to ask questions, all of which were answered to the patient's satisfaction. Mr. Bednarczyk has indicated that he wanted to continue with the  procedure. Attestation: I, the ordering provider, attest that I have discussed with the patient the benefits, risks, side-effects, alternatives, likelihood of achieving goals, and potential problems during recovery for the procedure that I have provided informed consent. Date  Time: 05/24/2022  8:10 AM  Pre-Procedure Preparation:  Monitoring: As per clinic protocol. Respiration, ETCO2, SpO2, BP, heart rate and rhythm monitor placed and checked for adequate function Safety Precautions: Patient was assessed for positional comfort and pressure points before starting the procedure. Time-out: I initiated and  conducted the "Time-out" before starting the procedure, as per protocol. The patient was asked to participate by confirming the accuracy of the "Time Out" information. Verification of the correct person, site, and procedure were performed and confirmed by me, the nursing staff, and the patient. "Time-out" conducted as per Joint Commission's Universal Protocol (UP.01.01.01). Time: 0981  Description of Procedure:          Laterality: Right Levels:  L3, L4, L5, and S1 Medial Branch Level(s). Safety Precautions: Aspiration looking for blood return was conducted prior to all injections. At no point did we inject any substances, as a needle was being advanced. Before injecting, the patient was told to immediately notify me if he was experiencing any new onset of "ringing in the ears, or metallic taste in the mouth". No attempts were made at seeking any paresthesias. Safe injection practices and needle disposal techniques used. Medications properly checked for expiration dates. SDV (single dose vial) medications used. After the completion of the procedure, all disposable equipment used was discarded in the proper designated medical waste containers. Local Anesthesia: Protocol guidelines were followed. The patient was positioned over the fluoroscopy table. The area was prepped in the usual manner. The time-out  was completed. The target area was identified using fluoroscopy. A 12-in long, straight, sterile hemostat was used with fluoroscopic guidance to locate the targets for each level blocked. Once located, the skin was marked with an approved surgical skin marker. Once all sites were marked, the skin (epidermis, dermis, and hypodermis), as well as deeper tissues (fat, connective tissue and muscle) were infiltrated with a small amount of a short-acting local anesthetic, loaded on a 10cc syringe with a 25G, 1.5-in  Needle. An appropriate amount of time was allowed for local anesthetics to take effect before proceeding to the next step. Technical description of process:  Radiofrequency Ablation (RFA) L2 Medial Branch Nerve RFA: Unable to get any type of stimulation. No RF performed at this level. L3 Medial Branch Nerve RFA: The target area for the L3 medial branch is at the junction of the postero-lateral aspect of the superior articular process and the superior, posterior, and medial edge of the transverse process of L4. Under fluoroscopic guidance, a Radiofrequency needle was inserted until contact was made with os over the superior postero-lateral aspect of the pedicular shadow (target area). Sensory and motor testing was conducted to properly adjust the position of the needle. Once satisfactory placement of the needle was achieved, the numbing solution was slowly injected after negative aspiration for blood. 2.0 mL of the nerve block solution was injected without difficulty or complication. After waiting for at least 3 minutes, the ablation was performed. Once completed, the needle was removed intact. L4 Medial Branch Nerve RFA: The target area for the L4 medial branch is at the junction of the postero-lateral aspect of the superior articular process and the superior, posterior, and medial edge of the transverse process of L5. Under fluoroscopic guidance, a Radiofrequency needle was inserted until contact was made  with os over the superior postero-lateral aspect of the pedicular shadow (target area). Sensory and motor testing was conducted to properly adjust the position of the needle. Once satisfactory placement of the needle was achieved, the numbing solution was slowly injected after negative aspiration for blood. 2.0 mL of the nerve block solution was injected without difficulty or complication. After waiting for at least 3 minutes, the ablation was performed. Once completed, the needle was removed intact. L5 Medial Branch Nerve RFA: The target  area for the L5 medial branch is at the junction of the postero-lateral aspect of the superior articular process of S1 and the superior, posterior, and medial edge of the sacral ala. Under fluoroscopic guidance, a Radiofrequency needle was inserted until contact was made with os over the superior postero-lateral aspect of the pedicular shadow (target area). Sensory and motor testing was conducted to properly adjust the position of the needle. Once satisfactory placement of the needle was achieved, the numbing solution was slowly injected after negative aspiration for blood. 2.0 mL of the nerve block solution was injected without difficulty or complication. After waiting for at least 3 minutes, the ablation was performed. Once completed, the needle was removed intact. S1 Medial Branch Nerve RFA: The target area for the S1 medial branch is located inferior to the junction of the S1 superior articular process and the L5 inferior articular process, posterior, inferior, and lateral to the 6 o'clock position of the L5-S1 facet joint, just superior to the S1 posterior foramen. Under fluoroscopic guidance, the Radiofrequency needle was advanced until contact was made with os over the Target area. Sensory and motor testing was conducted to properly adjust the position of the needle. Once satisfactory placement of the needle was achieved, the numbing solution was slowly injected after  negative aspiration for blood. 2.0 mL of the nerve block solution was injected without difficulty or complication. After waiting for at least 3 minutes, the ablation was performed. Once completed, the needle was removed intact. Radiofrequency lesioning (ablation):  Radiofrequency Generator: Medtronic AccurianTM AG 1000 RF Generator Sensory Stimulation Parameters: 50 Hz was used to locate & identify the nerve, making sure that the needle was positioned such that there was no sensory stimulation below 0.3 V or above 0.7 V. Motor Stimulation Parameters: 2 Hz was used to evaluate the motor component. Care was taken not to lesion any nerves that demonstrated motor stimulation of the lower extremities at an output of less than 2.5 times that of the sensory threshold, or a maximum of 2.0 V. Lesioning Technique Parameters: Standard Radiofrequency settings. (Not bipolar or pulsed.) Temperature Settings: 80 degrees C Lesioning time: 60 seconds Intra-operative Compliance: Compliant  Once the entire procedure was completed, the treated area was cleaned, making sure to leave some of the prepping solution back to take advantage of its long term bactericidal properties.    Illustration of the posterior view of the lumbar spine and the posterior neural structures. Laminae of L2 through S1 are labeled. DPRL5, dorsal primary ramus of L5; DPRS1, dorsal primary ramus of S1; DPR3, dorsal primary ramus of L3; FJ, facet (zygapophyseal) joint L3-L4; I, inferior articular process of L4; LB1, lateral branch of dorsal primary ramus of L1; IAB, inferior articular branches from L3 medial branch (supplies L4-L5 facet joint); IBP, intermediate branch plexus; MB3, medial branch of dorsal primary ramus of L3; NR3, third lumbar nerve root; S, superior articular process of L5; SAB, superior articular branches from L4 (supplies L4-5 facet joint also); TP3, transverse process of L3.  Vitals:   05/24/22 1000 05/24/22 1010 05/24/22 1020  05/24/22 1027  BP: (!) 174/108 (!) 171/103 (!) 173/110 (!) 169/99  Pulse:      Resp: '16 18 16 18  '$ Temp:      SpO2: 95% 96% 95% 97%  Weight:      Height:        Start Time: 0851 hrs. End Time: 0947 hrs.  Imaging Guidance (Spinal):          Type  of Imaging Technique: Fluoroscopy Guidance (Spinal) Indication(s): Assistance in needle guidance and placement for procedures requiring needle placement in or near specific anatomical locations not easily accessible without such assistance. Exposure Time: Please see nurses notes. Contrast: None used. Fluoroscopic Guidance: I was personally present during the use of fluoroscopy. "Tunnel Vision Technique" used to obtain the best possible view of the target area. Parallax error corrected before commencing the procedure. "Direction-depth-direction" technique used to introduce the needle under continuous pulsed fluoroscopy. Once target was reached, antero-posterior, oblique, and lateral fluoroscopic projection used confirm needle placement in all planes. Images permanently stored in EMR. Interpretation: No contrast injected. I personally interpreted the imaging intraoperatively. Adequate needle placement confirmed in multiple planes. Permanent images saved into the patient's record.  Antibiotic Prophylaxis:   Anti-infectives (From admission, onward)    None      Indication(s): None identified  Post-operative Assessment:  Post-procedure Vital Signs:  Pulse/HCG Rate: 6962 (NSR) Temp: 97.9 F (36.6 C) Resp: 18 BP: (!) 169/99 SpO2: 97 %  EBL: None  Complications: No immediate post-treatment complications observed by team, or reported by patient.  Note: The patient tolerated the entire procedure well. A repeat set of vitals were taken after the procedure and the patient was kept under observation following institutional policy, for this type of procedure. Post-procedural neurological assessment was performed, showing return to baseline, prior  to discharge. The patient was provided with post-procedure discharge instructions, including a section on how to identify potential problems. Should any problems arise concerning this procedure, the patient was given instructions to immediately contact us, at any time, without hesitation. In any case, we plan to contact the patient by telephone for a follow-up status report regarding this interventional procedure.  Comments:   The patient has indicated being interested in coming back in 2 weeks to have the other side done.  Plan of Care  Orders:  Orders Placed This Encounter  Procedures   Radiofrequency,Lumbar    Scheduling Instructions:     Side(s): Right-sided     Level: L3-4, L4-5, and L5-S1 Facets (L2, L3, L4, L5, and S1 Medial Branch)     Sedation: With Sedation.     Timeframe: Today    Order Specific Question:   Where will this procedure be performed?    Answer:   [facility]   Radiofrequency,Lumbar    Standing Status:   Future    Standing Expiration Date:   08/23/2022    Scheduling Instructions:     Side(s): Left-sided     Level: L3-4, L4-5, and L5-S1 Facets (L2, L3, L4, L5, and S1 Medial Branch)     Sedation: With Sedation.     Scheduling Timeframe: 2 weeks from now    Order Specific Question:   Where will this procedure be performed?    Answer:   [facility]   DG PAIN CLIN[facility] C-ARM 1-60 MIN NO REPORT    Intraoperative interpretation by procedural physician at [place].    Standing Status:   Standing    Number of Occurrences:   1    Order Specific Question:   Reason for exam:    Answer:   Assistance in needle guidance and placement for procedures requiring needle placement in or near specific anatomical locations not easily accessible without such assistance.   Informed Consent Details: Physician/Practitioner Attestation; Transcribe to consent form and obtain patient signature    Nursing Order: Transcribe to consent form and obtain patient  signature. Note: Always confirm laterality of  pain with Mr. Nees, before procedure.    Order Specific Question:   Physician/Practitioner attestation of informed consent for procedure/surgical case    Answer:   I, the physician/practitioner, attest that I have discussed with the patient the benefits, risks, side effects, alternatives, likelihood of achieving goals and potential problems during recovery for the procedure that I have provided informed consent.    Order Specific Question:   Procedure    Answer:   Lumbar Facet Radiofrequency Ablation    Order Specific Question:   Physician/Practitioner performing the procedure    Answer:   Jahmere Bramel A. Dossie Arbour, MD    Order Specific Question:   Indication/Reason    Answer:   Low Back Pain, with our without leg pain, due to Facet Joint Arthralgia (Joint Pain) known as Lumbar Facet Syndrome, secondary to Lumbar, and/or Lumbosacral Spondylosis (Arthritis of the Spine), without myelopathy or radiculopathy (Nerve Damage).   Care order/instruction: Please confirm that the patient has stopped the Xarelto (Rivaroxaban) x 3 days prior to procedure or surgery.    Please confirm that the patient has stopped the Xarelto (Rivaroxaban) x 3 days prior to procedure or surgery.    Standing Status:   Standing    Number of Occurrences:   1   Provide equipment / supplies at bedside    Procedure tray: "Radiofrequency Tray" Additional material: Large hemostat (x1); Small hemostat (x1); Towels (x8); 4x4 sterile sponge pack (x1) Needle type: Teflon-coated Radiofrequency Needle (Disposable  single use) Size: Regular Quantity: 5    Standing Status:   Standing    Number of Occurrences:   1    Order Specific Question:   Specify    Answer:   Radiofrequency Tray   Blood Thinner Instructions to Nursing    Always make sure patient has clearance from prescribing physician to stop blood thinners for interventional therapies. If the patient requires a Lovenox-bridge therapy,  make sure arrangements are made to institute it with the assistance of the PCP.    Scheduling Instructions:     Have Mr. Baswell stop the Xarelto (Rivaroxaban) x 3 days prior to procedure or surgery.   Bleeding precautions    Standing Status:   Standing    Number of Occurrences:   1   Chronic Opioid Analgesic:  No opioid analgesics prescribed by our practice. Highest recorded MME/day: 26.67 mg/day MME/day: 0 mg/day   Medications ordered for procedure: Meds ordered this encounter  Medications   lidocaine (XYLOCAINE) 2 % (with pres) injection 400 mg   pentafluoroprop-tetrafluoroeth (GEBAUERS) aerosol   lactated ringers infusion   midazolam (VERSED) 5 MG/5ML injection 0.5-2 mg    Make sure Flumazenil is available in the pyxis when using this medication. If oversedation occurs, administer 0.2 mg IV over 15 sec. If after 45 sec no response, administer 0.2 mg again over 1 min; may repeat at 1 min intervals; not to exceed 4 doses (1 mg)   fentaNYL (SUBLIMAZE) injection 25-50 mcg    Make sure Narcan is available in the pyxis when using this medication. In the event of respiratory depression (RR< 8/min): Titrate NARCAN (naloxone) in increments of 0.1 to 0.2 mg IV at 2-3 minute intervals, until desired degree of reversal.   ropivacaine (PF) 2 mg/mL (0.2%) (NAROPIN) injection 9 mL   triamcinolone acetonide (KENALOG-40) injection 40 mg   HYDROcodone-acetaminophen (NORCO/VICODIN) 5-325 MG tablet    Sig: Take 1 tablet by mouth every 8 (eight) hours as needed for up to 7 days for severe pain. Must last  7 days.    Dispense:  21 tablet    Refill:  0    For acute post-operative pain. Not to be refilled. Must last 7 days.   HYDROcodone-acetaminophen (NORCO/VICODIN) 5-325 MG tablet    Sig: Take 1 tablet by mouth every 8 (eight) hours as needed for up to 7 days for severe pain. Must last for 7 days.    Dispense:  21 tablet    Refill:  0    For acute post-operative pain. Not to be refilled. Must last 7  days.   hydrALAZINE (APRESOLINE) injection 5 mg   Medications administered: We administered lidocaine, lactated ringers, midazolam, fentaNYL, ropivacaine (PF) 2 mg/mL (0.2%), and triamcinolone acetonide.  See the medical record for exact dosing, route, and time of administration.  Follow-up plan:   Return in about 2 weeks (around 06/07/2022) for (66mn), (ECT): (L) L-FCT RFA #3, (Blood Thinner Protocol).       Interventional Therapies  Risk factors  Considerations:   NOTE: Xarelto Anticoagulation (Stop: 3 days  Restart: 6 hours)   Planned  Pending:   Therapeutic right lumbar facet RFA #3    Under consideration:   Possible right opturator + femoral NB  Possible right opturator + femoral nerve RFA    Completed:   Therapeutic right IA hip joint inj. x2 (04/30/2019) (100/100/50/90-100) Diagnostic bilateral SI joint Blk x2 (03/10/2020) (50/50/100 x 5 wks/0)  Diagnostic bilateral lumbar facet MBB x1 (05/07/2020) (100/100/100 x1 week/>75)  Therapeutic left lumbar facet RFA x2 (06/29/2021) (100/100/100/100)  Therapeutic right lumbar facet RFA x2 (06/15/2021) (100/100/100/100)    Palliative options:   Palliative right IA hip joint inj.  Palliative bilateral SI joint Blk  Therapeutic/palliative lumbar facet RFA      Recent Visits Date Type Provider Dept  05/02/22 Office Visit NMilinda Pointer MD [facility]  Showing recent visits within past 90 days and meeting all other requirements Today's Visits Date Type Provider Dept  05/24/22 Procedure visit NMilinda Pointer MD [facility]  Showing today's visits and meeting all other requirements Future Appointments Date Type Provider Dept  07/05/22 Appointment NMilinda Pointer MD [facility]  Showing future appointments within next 90 days and meeting all other requirements  Disposition: Discharge home  Discharge (Date  Time): 05/24/2022; 1030 hrs.   Primary Care Physician: [facility] [place] [facility] Note by: FGaspar Cola MD Date: 05/24/2022; Time: 1:33 PM  Disclaimer:  Medicine is not an eChief Strategy Officer The only guarantee in medicine is that nothing is guaranteed. It is important to note that the decision to proceed with this intervention was based on the information collected from the patient. The Data and conclusions were drawn from the patient's questionnaire, the interview, and the physical examination. Because the information was provided in large part by the patient, it cannot be guaranteed that it has not been purposely or unconsciously manipulated. Every effort has been made to obtain as much relevant data as possible for this evaluation. It is important to note that the conclusions that lead to this procedure are derived in large part from the available data. Always take into account that the treatment will also be dependent on availability of resources and existing treatment guidelines, considered by other Pain Management Practitioners as being common knowledge and practice, at the time of the intervention. For Medico-Legal purposes, it is also important to point out that variation in procedural techniques and pharmacological choices are the acceptable norm. The  indications, contraindications, technique, and results of the above procedure should only be interpreted and judged by a Board-Certified Interventional Pain Specialist with extensive familiarity and expertise in the same exact procedure and technique.

## 2022-05-24 ENCOUNTER — Ambulatory Visit
Admission: RE | Admit: 2022-05-24 | Discharge: 2022-05-24 | Disposition: A | Discharge status: Home / Self Care | Payer: No Typology Code available for payment source | Source: Ambulatory Visit | Attending: Pain Medicine | Admitting: Pain Medicine

## 2022-05-24 ENCOUNTER — Telehealth: Payer: Self-pay

## 2022-05-24 ENCOUNTER — Encounter: Payer: Self-pay | Admitting: Pain Medicine

## 2022-05-24 ENCOUNTER — Telehealth: Payer: Self-pay | Admitting: Pain Medicine

## 2022-05-24 ENCOUNTER — Ambulatory Visit: Payer: No Typology Code available for payment source | Attending: Pain Medicine | Admitting: Pain Medicine

## 2022-05-24 VITALS — BP 169/99 | HR 69 | Temp 97.9°F | Resp 18 | Ht 71.0 in | Wt 260.0 lb

## 2022-05-24 DIAGNOSIS — G8929 Other chronic pain: Secondary | ICD-10-CM | POA: Insufficient documentation

## 2022-05-24 DIAGNOSIS — Z7901 Long term (current) use of anticoagulants: Secondary | ICD-10-CM | POA: Insufficient documentation

## 2022-05-24 DIAGNOSIS — I10 Essential (primary) hypertension: Secondary | ICD-10-CM | POA: Diagnosis present

## 2022-05-24 DIAGNOSIS — M5137 Other intervertebral disc degeneration, lumbosacral region: Secondary | ICD-10-CM | POA: Diagnosis not present

## 2022-05-24 DIAGNOSIS — M47816 Spondylosis without myelopathy or radiculopathy, lumbar region: Secondary | ICD-10-CM

## 2022-05-24 DIAGNOSIS — M545 Low back pain, unspecified: Secondary | ICD-10-CM | POA: Diagnosis present

## 2022-05-24 DIAGNOSIS — M47817 Spondylosis without myelopathy or radiculopathy, lumbosacral region: Secondary | ICD-10-CM | POA: Diagnosis present

## 2022-05-24 DIAGNOSIS — M4316 Spondylolisthesis, lumbar region: Secondary | ICD-10-CM

## 2022-05-24 DIAGNOSIS — G8918 Other acute postprocedural pain: Secondary | ICD-10-CM | POA: Diagnosis present

## 2022-05-24 MED ORDER — MIDAZOLAM HCL 5 MG/5ML IJ SOLN
INTRAMUSCULAR | Status: AC
  Filled 2022-05-24: qty 5

## 2022-05-24 MED ORDER — LACTATED RINGERS IV SOLN: Freq: Once | INTRAVENOUS | Status: AC

## 2022-05-24 MED ORDER — ROPIVACAINE HCL 2 MG/ML IJ SOLN
INTRAMUSCULAR | Status: AC
  Filled 2022-05-24: qty 20

## 2022-05-24 MED ORDER — FENTANYL CITRATE (PF) 100 MCG/2ML IJ SOLN
25.0000 ug | INTRAMUSCULAR | Status: DC | PRN
  Administered 2022-05-24: 50 ug via INTRAVENOUS

## 2022-05-24 MED ORDER — TRIAMCINOLONE ACETONIDE 40 MG/ML IJ SUSP
INTRAMUSCULAR | Status: AC
  Filled 2022-05-24: qty 1

## 2022-05-24 MED ORDER — HYDRALAZINE HCL 20 MG/ML IJ SOLN: 5.0000 mg | Freq: Once | INTRAMUSCULAR | Status: DC

## 2022-05-24 MED ORDER — LIDOCAINE HCL 2 % IJ SOLN
INTRAMUSCULAR | Status: AC
  Filled 2022-05-24: qty 20

## 2022-05-24 MED ORDER — HYDROCODONE-ACETAMINOPHEN 5-325 MG PO TABS: 1.0000 | ORAL_TABLET | Freq: Three times a day (TID) | ORAL | 0 refills | Status: AC | PRN

## 2022-05-24 MED ORDER — PENTAFLUOROPROP-TETRAFLUOROETH EX AERO
INHALATION_SPRAY | Freq: Once | CUTANEOUS | Status: DC
  Filled 2022-05-24: qty 116

## 2022-05-24 MED ORDER — ROPIVACAINE HCL 2 MG/ML IJ SOLN
9.0000 mL | Freq: Once | INTRAMUSCULAR | Status: AC
  Administered 2022-05-24: 9 mL via PERINEURAL

## 2022-05-24 MED ORDER — MIDAZOLAM HCL 5 MG/5ML IJ SOLN
0.5000 mg | Freq: Once | INTRAMUSCULAR | Status: AC
  Administered 2022-05-24: 2 mg via INTRAVENOUS

## 2022-05-24 MED ORDER — FENTANYL CITRATE (PF) 100 MCG/2ML IJ SOLN
INTRAMUSCULAR | Status: AC
  Filled 2022-05-24: qty 2

## 2022-05-24 MED ORDER — TRIAMCINOLONE ACETONIDE 40 MG/ML IJ SUSP
40.0000 mg | Freq: Once | INTRAMUSCULAR | Status: AC
  Administered 2022-05-24: 40 mg

## 2022-05-24 MED ORDER — LIDOCAINE HCL 2 % IJ SOLN
20.0000 mL | Freq: Once | INTRAMUSCULAR | Status: AC
  Administered 2022-05-24: 100 mg

## 2022-05-24 NOTE — Progress Notes (Signed)
Safety precautions to be maintained throughout the outpatient stay will include: orient to surroundings, keep bed in low position, maintain call bell within reach at all times, provide assistance with transfer out of bed and ambulation.  

## 2022-05-24 NOTE — Patient Instructions (Addendum)
____________________________________________________________________________________________  Post-procedure Information What to expect: Most procedures involve the use of a local anesthetic (numbing medicine), and a steroid (anti-inflammatory medicine).  The local anesthetics may cause temporary numbness and weakness of the legs or arms, depending on the location of the block. This numbness/weakness may last 4-6 hours, depending on the local anesthetic used. In rare instances, it can last up to 24 hours. While numb, you must be very careful not to injure the extremity.  After any procedure, you could expect the pain to get better within 15-20 minutes. This relief is temporary and may last 4-6 hours. Once the local anesthetics wears off, you could experience discomfort, possibly more than usual, for up to 10 (ten) days. In the case of radiofrequencies, it may last up to 6 weeks. Surgeries may take up to 8 weeks for the healing process. The discomfort is due to the irritation caused by needles going through skin and muscle. To minimize the discomfort, we recommend using ice the first day, and heat from then on. The ice should be applied for 15 minutes on, and 15 minutes off. Keep repeating this cycle until bedtime. Avoid applying the ice directly to the skin, to prevent frostbite. Heat should be used daily, until the pain improves (4-10 days). Be careful not to burn yourself.  Occasionally you may experience muscle spasms or cramps. These occur as a consequence of the irritation caused by the needle sticks to the muscle and the blood that will inevitably be lost into the surrounding muscle tissue. Blood tends to be very irritating to tissues, which tend to react by going into spasm. These spasms may start the same day of your procedure, but they may also take days to develop. This late onset type of spasm or cramp is usually caused by electrolyte imbalances triggered by the steroids, at the level of the  kidney. Cramps and spasms tend to respond well to muscle relaxants, multivitamins (some are triggered by the procedure, but may have their origins in vitamin deficiencies), and "Gatorade", or any sports drinks that can replenish any electrolyte imbalances. (If you are a diabetic, ask your pharmacist to get you a sugar-free brand.) Warm showers or baths may also be helpful. Stretching exercises are highly recommended.  General Instructions:  Be alert for signs of possible infection: redness, swelling, heat, red streaks, elevated temperature, and/or fever. These typically appear 4 to 6 days after the procedure. Immediately notify your doctor if you experience unusual bleeding, difficulty breathing, or loss of bowel or bladder control. If you experience increased pain, do not increase your pain medicine intake, unless instructed by your pain physician.  Post-Procedure Care:  Be careful in moving about. Muscle spasms in the area of the injection may occur. Applying ice or heat to the area is often helpful. The incidence of spinal headaches after epidural injections ranges between 1.4% and 6%. If you develop a headache that does not seem to respond to conservative therapy, please let your physician know. This can be treated with an epidural blood patch.   Post-procedure numbness or redness is to be expected, however it should average 4 to 6 hours. If numbness and weakness of your extremities begins to develop 4 to 6 hours after your procedure, and is felt to be progressing and worsening, immediately contact your physician.  Diet:  If you experience nausea, do not eat until this sensation goes away. If you had a "Stellate Ganglion Block" for upper extremity "Reflex Sympathetic Dystrophy", do not eat or   drink until your hoarseness goes away. In any case, always start with liquids first and if you tolerate them well, then slowly progress to more solid foods.  Activity:  For the first 4 to 6 hours after the  procedure, use caution in moving about as you may experience numbness and/or weakness. Use caution in cooking, using household electrical appliances, and climbing steps. If you need to reach your Doctor call our office: [phone] (During business hours) or [phone] (After business hours).  Business Hours: Monday-Thursday 8:00 am - 4:00 PM    Fridays: Closed     In case of an emergency: In case of emergency, call 911 or go to the nearest emergency room and have the physician there call us.  Interpretation of Procedure Every nerve block has two components: a diagnostic component, and a treatment component. Unrealistic expectations are the most common causes of "perceived failure".  In a perfect world, a single nerve block should be able to completely and permanently eliminate the pain. Sadly, the world is not perfect.  Most pain management nerve blocks are performed using local anesthetics and steroids. Steroids are responsible for any long-term benefit that you may experience. Their purpose is to decrease any chronic swelling that may exist in the area. Steroids begin to work immediately after being injected. However, most patients will not experience any benefits until 5 to 10 days after the injection, when the swelling has come down to the point where they can tell a difference. Steroids will only help if there is swelling to be treated. As such, they can assist with the diagnosis. If effective, they suggest an inflammatory component to the pain, and if ineffective, they rule out inflammation as the main cause or component of the problem. If the problem is one of mechanical compression, you will get no benefit from those steroids.   In the case of local anesthetics, they have a crucial role in the diagnosis of your condition. Most will begin to work within15 to 20 minutes after injection. The duration will depend on the type used (short- vs. Long-acting). It is of outmost importance that  patients keep tract of their pain, after the procedure. To assist with this matter, a "Post-procedure Pain Diary" is provided. Make sure to complete it and to bring it back to your follow-up appointment.  As long as the patient keeps accurate, detailed records of their symptoms after every procedure, and returns to have those interpreted, every procedure will provide us with invaluable information. Even a block that does not provide the patient with any relief, will always provide us with information about the mechanism and the origin of the pain. The only time a nerve block can be considered a waste of time is when patients do not keep track of the results, or do not keep their post-procedure appointment.  Reporting the results back to your physician The Pain Score  Pain is a subjective complaint. It cannot be seen, touched, or measured. We depend entirely on the patient's report of the pain in order to assess your condition and treatment. To evaluate the pain, we use a pain scale, where "0" means "No Pain", and a "10" is "the worst possible pain that you can even imagine" (i.e. something like been eaten alive by a shark or being torn apart by a lion).   Use the Pain Scale provided. You will frequently be asked to rate your pain. Please be accurate, remember that medical decisions will be based on your   responses. Please do not rate your pain above a 10. Doing so is actually interpreted as "symptom magnification" (exaggeration). To put this into perspective, when you tell us that your pain is at a 10 (ten), what you are saying is that there is nothing we can do to make this pain any worse. (Carefully think about that.) ____________________________________________________________________________________________   Radiofrequency Ablation Radiofrequency ablation is a procedure that is performed to relieve pain. The procedure is often used for back, neck, or arm pain. Radiofrequency ablation involves the use  of a machine that creates radio waves to make heat. During the procedure, the heat is applied to the nerve that carries the pain signal. The heat damages the nerve and interferes with the pain signal. Pain relief usually starts about 2 weeks after the procedure and lasts for 6 months to 1 year. Tell a health care provider about: Any allergies you have. All medicines you are taking, including vitamins, herbs, eye drops, creams, and over-the-counter medicines. Any problems you or family members have had with anesthetic medicines. Any bleeding problems you have. Any surgeries you have had. Any medical conditions you have. Whether you are pregnant or may be pregnant. What are the risks? Generally, this is a safe procedure. However, problems may occur, including: Pain or soreness at the injection site. Allergic reaction to medicines given during the procedure. Bleeding. Infection at the injection site. Damage to nerves or blood vessels. What happens before the procedure? When to stop eating and drinking Follow instructions from your health care provider about what you may eat and drink before your procedure. These may include: 8 hours before the procedure Stop eating most foods. Do not eat meat, fried foods, or fatty foods. Eat only light foods, such as toast or crackers. All liquids are okay except energy drinks and alcohol. 6 hours before the procedure Stop eating. Drink only clear liquids, such as water, clear fruit juice, black coffee, plain tea, and sports drinks. Do not drink energy drinks or alcohol. 2 hours before the procedure Stop drinking all liquids. You may be allowed to take medicine with small sips of water. If you do not follow your health care provider's instructions, your procedure may be delayed or canceled. Medicines Ask your health care provider about: Changing or stopping your regular medicines. This is especially important if you are taking diabetes medicines or  blood thinners. Taking medicines such as aspirin and ibuprofen. These medicines can thin your blood. Do not take these medicines unless your health care provider tells you to take them. Taking over-the-counter medicines, vitamins, herbs, and supplements. General instructions Ask your health care provider what steps will be taken to help prevent infection. These steps may include: Removing hair at the procedure site. Washing skin with a germ-killing soap. Taking antibiotic medicine. If you will be going home right after the procedure, plan to have a responsible adult: Take you home from the hospital or clinic. You will not be allowed to drive. Care for you for the time you are told. What happens during the procedure?  You will be awake during the procedure. You will need to be able to talk with the health care provider during the procedure. An IV will be inserted into one of your veins. You will be given one or more of the following: A medicine to help you relax (sedative). A medicine to numb the area (local anesthetic). Your health care provider will insert a radiofrequency needle into the area to be treated. This is done  with the help of fluoroscopy. A wire that carries the radio waves (electrode) will be put through the radiofrequency needle. An electrical pulse will be sent through the electrode to verify the correct nerve that is causing your pain. You will feel a tingling sensation, and you may have muscle twitching. The tissue around the needle tip will be heated by an electric current that comes from the radiofrequency machine. This will numb the nerves. The needle will be removed. A bandage (dressing) will be put on the insertion area. The procedure may vary among health care providers and hospitals. What happens after the procedure? Your blood pressure, heart rate, breathing rate, and blood oxygen level will be monitored until you leave the hospital or clinic. Return to your  normal activities as told by your health care provider. Ask your health care provider what activities are safe for you. If you were given a sedative during the procedure, it can affect you for several hours. Do not drive or operate machinery until your health care provider says that it is safe. Summary Radiofrequency ablation is a procedure that is performed to relieve pain. The procedure is often used for back, neck, or arm pain. Radiofrequency ablation involves the use of a machine that creates radio waves to make heat. Plan to have a responsible adult take you home from the hospital or clinic. Do not drive or operate machinery until your health care provider says that it is safe. Return to your normal activities as told by your health care provider. Ask your health care provider what activities are safe for you. This information is not intended to replace advice given to you by your health care provider. Make sure you discuss any questions you have with your health care provider. Document Revised: 10/27/2020 Document Reviewed: 10/27/2020 Elsevier Patient Education  [place].   ______________________________________________________________________  Preparing for your procedure  During your procedure appointment there will be: No Prescription Refills. No disability issues to discussed. No medication changes or discussions.  Instructions: Food intake: Avoid eating anything solid for at least 8 hours prior to your procedure. Clear liquid intake: You may take clear liquids such as water up to 2 hours prior to your procedure. (No carbonated drinks. No soda.) Transportation: Unless otherwise stated by your physician, bring a driver. Morning Medicines: Except for blood thinners, take all of your other morning medications with a sip of water. Make sure to take your heart and blood pressure medicines. If your blood pressure's lower number is above 100, the case will be rescheduled. Blood  thinners: If you take a blood thinner, but were not instructed to stop it, call our office [phone] and ask to talk to a nurse. Not stopping a blood thinner prior to certain procedures could lead to serious complications. Diabetics on insulin: Notify the staff so that you can be scheduled 1st case in the morning. If your diabetes requires high dose insulin, take only  of your normal insulin dose the morning of the procedure and notify the staff that you have done so. Preventing infections: Shower with an antibacterial soap the morning of your procedure.  Build-up your immune system: Take 1000 mg of Vitamin C with every meal (3 times a day) the day prior to your procedure. Antibiotics: Inform the nursing staff if you are taking any antibiotics or if you have any conditions that may require antibiotics prior to procedures. (Example: recent joint implants)   Pregnancy: If you are pregnant make sure to notify  the nursing staff. Not doing so may result in injury to the fetus, including death.  Sickness: If you have a cold, fever, or any active infections, call and cancel or reschedule your procedure. Receiving steroids while having an infection may result in complications. Arrival: You must be in the facility at least 30 minutes prior to your scheduled procedure. Tardiness: Your scheduled time is also the cutoff time. If you do not arrive at least 15 minutes prior to your procedure, you will be rescheduled.  Children: Do not bring any children with you. Make arrangements to keep them home. Dress appropriately: There is always a possibility that your clothing may get soiled. Avoid long dresses. Valuables: Do not bring any jewelry or valuables.  Reasons to call and reschedule or cancel your procedure: (Following these recommendations will minimize the risk of a serious complication.) Surgeries: Avoid having procedures within 2 weeks of any surgery. (Avoid for 2 weeks before or after any surgery). Flu  Shots: Avoid having procedures within 2 weeks of a flu shots or . (Avoid for 2 weeks before or after immunizations). Barium: Avoid having a procedure within 7-10 days after having had a radiological study involving the use of radiological contrast. (Myelograms, Barium swallow or enema study). Heart attacks: Avoid any elective procedures or surgeries for the initial 6 months after a "Myocardial Infarction" (Heart Attack). Blood thinners: It is imperative that you stop these medications before procedures. Let us know if you if you take any blood thinner.  Infection: Avoid procedures during or within two weeks of an infection (including chest colds or gastrointestinal problems). Symptoms associated with infections include: Localized redness, fever, chills, night sweats or profuse sweating, burning sensation when voiding, cough, congestion, stuffiness, runny nose, sore throat, diarrhea, nausea, vomiting, cold or Flu symptoms, recent or current infections. It is specially important if the infection is over the area that we intend to treat. Heart and lung problems: Symptoms that may suggest an active cardiopulmonary problem include: cough, chest pain, breathing difficulties or shortness of breath, dizziness, ankle swelling, uncontrolled high or unusually low blood pressure, and/or palpitations. If you are experiencing any of these symptoms, cancel your procedure and contact your primary care physician for an evaluation.  Remember:  Regular Business hours are:  Monday to Thursday 8:00 AM to 4:00 PM  Provider's Schedule: Milinda Pointer, MD:  Procedure days: Tuesday and Thursday 7:30 AM to 4:00 PM  Gillis Santa, MD:  Procedure days: Monday and Wednesday 7:30 AM to 4:00 PM  ______________________________________________________________________    ____________________________________________________________________________________________  General Risks and Possible Complications  Patient  Responsibilities: It is important that you read this as it is part of your informed consent. It is our duty to inform you of the risks and possible complications associated with treatments offered to you. It is your responsibility as a patient to read this and to ask questions about anything that is not clear or that you believe was not covered in this document.  Patient's Rights: You have the right to refuse treatment. You also have the right to change your mind, even after initially having agreed to have the treatment done. However, under this last option, if you wait until the last second to change your mind, you may be charged for the materials used up to that point.  Introduction: Medicine is not an Chief Strategy Officer. Everything in Medicine, including the lack of treatment(s), carries the potential for danger, harm, or loss (which is by definition: Risk). In Medicine, a complication is a  secondary problem, condition, or disease that can aggravate an already existing one. All treatments carry the risk of possible complications. The fact that a side effects or complications occurs, does not imply that the treatment was conducted incorrectly. It must be clearly understood that these can happen even when everything is done following the highest safety standards.  No treatment: You can choose not to proceed with the proposed treatment alternative. The "PRO(s)" would include: avoiding the risk of complications associated with the therapy. The "CON(s)" would include: not getting any of the treatment benefits. These benefits fall under one of three categories: diagnostic; therapeutic; and/or palliative. Diagnostic benefits include: getting information which can ultimately lead to improvement of the disease or symptom(s). Therapeutic benefits are those associated with the successful treatment of the disease. Finally, palliative benefits are those related to the decrease of the primary symptoms, without necessarily  curing the condition (example: decreasing the pain from a flare-up of a chronic condition, such as incurable terminal cancer).  General Risks and Complications: These are associated to most interventional treatments. They can occur alone, or in combination. They fall under one of the following six (6) categories: no benefit or worsening of symptoms; bleeding; infection; nerve damage; allergic reactions; and/or death. No benefits or worsening of symptoms: In Medicine there are no guarantees, only probabilities. No healthcare provider can ever guarantee that a medical treatment will work, they can only state the probability that it may. Furthermore, there is always the possibility that the condition may worsen, either directly, or indirectly, as a consequence of the treatment. Bleeding: This is more common if the patient is taking a blood thinner, either prescription or over the counter (example: Goody Powders, Fish oil, Aspirin, Garlic, etc.), or if suffering a condition associated with impaired coagulation (example: Hemophilia, cirrhosis of the liver, low platelet counts, etc.). However, even if you do not have one on these, it can still happen. If you have any of these conditions, or take one of these drugs, make sure to notify your treating physician. Infection: This is more common in patients with a compromised immune system, either due to disease (example: diabetes, cancer, human immunodeficiency virus [HIV], etc.), or due to medications or treatments (example: therapies used to treat cancer and rheumatological diseases). However, even if you do not have one on these, it can still happen. If you have any of these conditions, or take one of these drugs, make sure to notify your treating physician. Nerve Damage: This is more common when the treatment is an invasive one, but it can also happen with the use of medications, such as those used in the treatment of cancer. The damage can occur to small secondary  nerves, or to large primary ones, such as those in the spinal cord and brain. This damage may be temporary or permanent and it may lead to impairments that can range from temporary numbness to permanent paralysis and/or brain death. Allergic Reactions: Any time a substance or material comes in contact with our body, there is the possibility of an allergic reaction. These can range from a mild skin rash (contact dermatitis) to a severe systemic reaction (anaphylactic reaction), which can result in death. Death: In general, any medical intervention can result in death, most of the time due to an unforeseen complication. ____________________________________________________________________________________________    ____________________________________________________________________________________________  Blood Thinners  IMPORTANT NOTICE:  If you take any of these, make sure to notify the nursing staff.  Failure to do so may result in injury.  Recommended time intervals to stop and restart blood-thinners, before & after invasive procedures  Generic Name Brand Name Pre-procedure. Stop this long before procedure. Post-procedure. Minimum waiting period before restarting.  Abciximab Reopro 15 days 2 hrs  Alteplase Activase 10 days 10 days  Anagrelide Agrylin    Apixaban Eliquis 3 days 6 hrs  Cilostazol Pletal 3 days 5 hrs  Clopidogrel Plavix 7-10 days 2 hrs  Dabigatran Pradaxa 5 days 6 hrs  Dalteparin Fragmin 24 hours 4 hrs  Dipyridamole Aggrenox 11days 2 hrs  Edoxaban Lixiana; Savaysa 3 days 2 hrs  Enoxaparin  Lovenox 24 hours 4 hrs  Eptifibatide Integrillin 8 hours 2 hrs  Fondaparinux  Arixtra 72 hours 12 hrs  Hydroxychloroquine Plaquenil 11 days   Prasugrel Effient 7-10 days 6 hrs  Reteplase Retavase 10 days 10 days  Rivaroxaban Xarelto 3 days 6 hrs  Ticagrelor Brilinta 5-7 days 6 hrs  Ticlopidine Ticlid 10-14 days 2 hrs  Tinzaparin Innohep 24 hours 4 hrs  Tirofiban Aggrastat 8  hours 2 hrs  Warfarin Coumadin 5 days 2 hrs   Other medications with blood-thinning effects  Product indications Generic (Brand) names Note  Cholesterol Lipitor Stop 4 days before procedure  Blood thinner (injectable) Heparin (LMW or LMWH Heparin) Stop 24 hours before procedure  Cancer Ibrutinib (Imbruvica) Stop 7 days before procedure  Malaria/Rheumatoid Hydroxychloroquine (Plaquenil) Stop 11 days before procedure  Thrombolytics  10 days before or after procedures   Over-the-counter (OTC) Products with blood-thinning effects  Product Common names Stop Time  Aspirin > 325 mg Goody Powders, Excedrin, etc. 11 days  Aspirin ? 81 mg  7 days  Fish oil  4 days  Garlic supplements  7 days  Ginkgo biloba  36 hours  Ginseng  24 hours  NSAIDs Ibuprofen, Naprosyn, etc. 3 days  Vitamin E  4 days   ____________________________________________________________________________________________

## 2022-05-24 NOTE — Addendum Note (Signed)
Addended by: Milinda Pointer A on: 05/24/2022 01:33 PM   Modules accepted: Orders

## 2022-05-24 NOTE — Telephone Encounter (Signed)
LM for patient to call office so that I could inform him to stop Xarelto for 3 days and to make sure he takes BP medication per Dr Dossie Arbour.

## 2022-05-24 NOTE — Telephone Encounter (Signed)
Patient called and is asking to go ahead with RFA on the opposite side in 2 weeks. Dr Dossie Arbour did not order this on discharge. We gave him return appt for 6 weeks.  Please advise.

## 2022-05-24 NOTE — Telephone Encounter (Signed)
Dr Dossie Arbour notified.

## 2022-05-24 NOTE — Progress Notes (Signed)
8110 Hydralazine '5mg'$  slow IVP per MD order.  Patient reminded to take his blood pressure medication as soon as he gets home. He denies taking his AM dose this morning before leaving Home. 1010  Denies CP, HA, blurred vision or any other symptoms. Denies pain at this time.

## 2022-05-24 NOTE — Progress Notes (Signed)
Tried calling patient to schedule and go over instructions. No answer, mailbox full

## 2022-05-24 NOTE — Telephone Encounter (Signed)
Blanch Media notified and will see if approval is done and she will let us know when it is scheduled so that we can call the patient with instructions. kt.

## 2022-05-24 NOTE — Addendum Note (Signed)
Addended by: Milinda Pointer A on: 05/24/2022 12:01 PM   Modules accepted: Orders

## 2022-05-25 ENCOUNTER — Telehealth: Payer: Self-pay

## 2022-05-25 NOTE — Telephone Encounter (Signed)
Post procedure follow up.  Patient states he is doing well 

## 2022-05-30 DIAGNOSIS — I10 Essential (primary) hypertension: Secondary | ICD-10-CM | POA: Diagnosis not present

## 2022-05-30 DIAGNOSIS — E538 Deficiency of other specified B group vitamins: Secondary | ICD-10-CM | POA: Diagnosis not present

## 2022-05-30 DIAGNOSIS — H02402 Unspecified ptosis of left eyelid: Secondary | ICD-10-CM | POA: Diagnosis not present

## 2022-05-30 DIAGNOSIS — M5481 Occipital neuralgia: Secondary | ICD-10-CM | POA: Diagnosis not present

## 2022-05-30 DIAGNOSIS — G43119 Migraine with aura, intractable, without status migrainosus: Secondary | ICD-10-CM | POA: Diagnosis not present

## 2022-06-02 ENCOUNTER — Encounter: Payer: Self-pay | Admitting: Pain Medicine

## 2022-06-02 ENCOUNTER — Telehealth: Payer: Self-pay

## 2022-06-02 ENCOUNTER — Ambulatory Visit: Payer: No Typology Code available for payment source | Attending: Pain Medicine | Admitting: Pain Medicine

## 2022-06-02 VITALS — BP 183/100 | HR 82 | Temp 97.3°F | Resp 20 | Ht 70.5 in | Wt 255.0 lb

## 2022-06-02 DIAGNOSIS — Z09 Encounter for follow-up examination after completed treatment for conditions other than malignant neoplasm: Secondary | ICD-10-CM | POA: Diagnosis present

## 2022-06-02 DIAGNOSIS — I10 Essential (primary) hypertension: Secondary | ICD-10-CM | POA: Insufficient documentation

## 2022-06-02 DIAGNOSIS — M47816 Spondylosis without myelopathy or radiculopathy, lumbar region: Secondary | ICD-10-CM

## 2022-06-02 DIAGNOSIS — Z7901 Long term (current) use of anticoagulants: Secondary | ICD-10-CM | POA: Diagnosis present

## 2022-06-02 DIAGNOSIS — M5137 Other intervertebral disc degeneration, lumbosacral region: Secondary | ICD-10-CM | POA: Insufficient documentation

## 2022-06-02 DIAGNOSIS — M4316 Spondylolisthesis, lumbar region: Secondary | ICD-10-CM | POA: Diagnosis present

## 2022-06-02 DIAGNOSIS — G8929 Other chronic pain: Secondary | ICD-10-CM | POA: Diagnosis present

## 2022-06-02 DIAGNOSIS — M545 Low back pain, unspecified: Secondary | ICD-10-CM | POA: Diagnosis present

## 2022-06-02 NOTE — Telephone Encounter (Signed)
Attempted to call patient to inform him to follow up with his PCP about blood pressure, take blood pressure medication, and to restart his blood thinner.  Unable to leave a message because his voicemailbox is full.  Will attempt to call again later.

## 2022-06-02 NOTE — Telephone Encounter (Signed)
Spoke with patient and informed him to notify PCP of blood pressure, to take blood pressure medications and to restart his blood thinner.  Patient states understanding.  Informed him to always take his blood pressure medication the morning of procedure.

## 2022-06-02 NOTE — Patient Instructions (Addendum)
______________________________________________________________________  Preparing for your procedure  During your procedure appointment there will be: No Prescription Refills. No disability issues to discussed. No medication changes or discussions.  Instructions: Food intake: Avoid eating anything solid for at least 8 hours prior to your procedure. Clear liquid intake: You may take clear liquids such as water up to 2 hours prior to your procedure. (No carbonated drinks. No soda.) Transportation: Unless otherwise stated by your physician, bring a driver. Morning Medicines: Except for blood thinners, take all of your other morning medications with a sip of water. Make sure to take your heart and blood pressure medicines. If your blood pressure's lower number is above 100, the case will be rescheduled. Blood thinners: If you take a blood thinner, but were not instructed to stop it, call our office [phone] and ask to talk to a nurse. Not stopping a blood thinner prior to certain procedures could lead to serious complications. Diabetics on insulin: Notify the staff so that you can be scheduled 1st case in the morning. If your diabetes requires high dose insulin, take only  of your normal insulin dose the morning of the procedure and notify the staff that you have done so. Preventing infections: Shower with an antibacterial soap the morning of your procedure.  Build-up your immune system: Take 1000 mg of Vitamin C with every meal (3 times a day) the day prior to your procedure. Antibiotics: Inform the nursing staff if you are taking any antibiotics or if you have any conditions that may require antibiotics prior to procedures. (Example: recent joint implants)   Pregnancy: If you are pregnant make sure to notify the nursing staff. Not doing so may result in injury to the fetus, including death.  Sickness: If you have a cold, fever, or any active infections, call and cancel or reschedule your  procedure. Receiving steroids while having an infection may result in complications. Arrival: You must be in the facility at least 30 minutes prior to your scheduled procedure. Tardiness: Your scheduled time is also the cutoff time. If you do not arrive at least 15 minutes prior to your procedure, you will be rescheduled.  Children: Do not bring any children with you. Make arrangements to keep them home. Dress appropriately: There is always a possibility that your clothing may get soiled. Avoid long dresses. Valuables: Do not bring any jewelry or valuables.  Reasons to call and reschedule or cancel your procedure: (Following these recommendations will minimize the risk of a serious complication.) Surgeries: Avoid having procedures within 2 weeks of any surgery. (Avoid for 2 weeks before or after any surgery). Flu Shots: Avoid having procedures within 2 weeks of a flu shots or . (Avoid for 2 weeks before or after immunizations). Barium: Avoid having a procedure within 7-10 days after having had a radiological study involving the use of radiological contrast. (Myelograms, Barium swallow or enema study). Heart attacks: Avoid any elective procedures or surgeries for the initial 6 months after a "Myocardial Infarction" (Heart Attack). Blood thinners: It is imperative that you stop these medications before procedures. Let us know if you if you take any blood thinner.  Infection: Avoid procedures during or within two weeks of an infection (including chest colds or gastrointestinal problems). Symptoms associated with infections include: Localized redness, fever, chills, night sweats or profuse sweating, burning sensation when voiding, cough, congestion, stuffiness, runny nose, sore throat, diarrhea, nausea, vomiting, cold or Flu symptoms, recent or current infections. It is specially important if the infection is   over the area that we intend to treat. Heart and lung problems: Symptoms that may suggest an  active cardiopulmonary problem include: cough, chest pain, breathing difficulties or shortness of breath, dizziness, ankle swelling, uncontrolled high or unusually low blood pressure, and/or palpitations. If you are experiencing any of these symptoms, cancel your procedure and contact your primary care physician for an evaluation.  Remember:  Regular Business hours are:  Monday to Thursday 8:00 AM to 4:00 PM  Provider's Schedule: [name] Johanning, MD:  Procedure days: Tuesday and Thursday 7:30 AM to 4:00 PM  [name] Lateef, MD:  Procedure days: Monday and Wednesday 7:30 AM to 4:00 PM  ______________________________________________________________________    ____________________________________________________________________________________________  General Risks and Possible Complications  Patient Responsibilities: It is important that you read this as it is part of your informed consent. It is our duty to inform you of the risks and possible complications associated with treatments offered to you. It is your responsibility as a patient to read this and to ask questions about anything that is not clear or that you believe was not covered in this document.  Patient's Rights: You have the right to refuse treatment. You also have the right to change your mind, even after initially having agreed to have the treatment done. However, under this last option, if you wait until the last second to change your mind, you may be charged for the materials used up to that point.  Introduction: Medicine is not an exact science. Everything in Medicine, including the lack of treatment(s), carries the potential for danger, harm, or loss (which is by definition: Risk). In Medicine, a complication is a secondary problem, condition, or disease that can aggravate an already existing one. All treatments carry the risk of possible complications. The fact that a side effects or complications occurs, does not imply  that the treatment was conducted incorrectly. It must be clearly understood that these can happen even when everything is done following the highest safety standards.  No treatment: You can choose not to proceed with the proposed treatment alternative. The "PRO(s)" would include: avoiding the risk of complications associated with the therapy. The "CON(s)" would include: not getting any of the treatment benefits. These benefits fall under one of three categories: diagnostic; therapeutic; and/or palliative. Diagnostic benefits include: getting information which can ultimately lead to improvement of the disease or symptom(s). Therapeutic benefits are those associated with the successful treatment of the disease. Finally, palliative benefits are those related to the decrease of the primary symptoms, without necessarily curing the condition (example: decreasing the pain from a flare-up of a chronic condition, such as incurable terminal cancer).  General Risks and Complications: These are associated to most interventional treatments. They can occur alone, or in combination. They fall under one of the following six (6) categories: no benefit or worsening of symptoms; bleeding; infection; nerve damage; allergic reactions; and/or death. No benefits or worsening of symptoms: In Medicine there are no guarantees, only probabilities. No healthcare provider can ever guarantee that a medical treatment will work, they can only state the probability that it may. Furthermore, there is always the possibility that the condition may worsen, either directly, or indirectly, as a consequence of the treatment. Bleeding: This is more common if the patient is taking a blood thinner, either prescription or over the counter (example: Goody Powders, Fish oil, Aspirin, Garlic, etc.), or if suffering a condition associated with impaired coagulation (example: Hemophilia, cirrhosis of the liver, low platelet counts, etc.). However, even if   you  do not have one on these, it can still happen. If you have any of these conditions, or take one of these drugs, make sure to notify your treating physician. Infection: This is more common in patients with a compromised immune system, either due to disease (example: diabetes, cancer, human immunodeficiency virus [HIV], etc.), or due to medications or treatments (example: therapies used to treat cancer and rheumatological diseases). However, even if you do not have one on these, it can still happen. If you have any of these conditions, or take one of these drugs, make sure to notify your treating physician. Nerve Damage: This is more common when the treatment is an invasive one, but it can also happen with the use of medications, such as those used in the treatment of cancer. The damage can occur to small secondary nerves, or to large primary ones, such as those in the spinal cord and brain. This damage may be temporary or permanent and it may lead to impairments that can range from temporary numbness to permanent paralysis and/or brain death. Allergic Reactions: Any time a substance or material comes in contact with our body, there is the possibility of an allergic reaction. These can range from a mild skin rash (contact dermatitis) to a severe systemic reaction (anaphylactic reaction), which can result in death. Death: In general, any medical intervention can result in death, most of the time due to an unforeseen complication. ____________________________________________________________________________________________    ____________________________________________________________________________________________  Blood Thinners  IMPORTANT NOTICE:  If you take any of these, make sure to notify the nursing staff.  Failure to do so may result in injury.  Recommended time intervals to stop and restart blood-thinners, before & after invasive procedures  Generic Name Brand Name Pre-procedure. Stop this  long before procedure. Post-procedure. Minimum waiting period before restarting.  Abciximab Reopro 15 days 2 hrs  Alteplase Activase 10 days 10 days  Anagrelide Agrylin    Apixaban Eliquis 3 days 6 hrs  Cilostazol Pletal 3 days 5 hrs  Clopidogrel Plavix 7-10 days 2 hrs  Dabigatran Pradaxa 5 days 6 hrs  Dalteparin Fragmin 24 hours 4 hrs  Dipyridamole Aggrenox 11days 2 hrs  Edoxaban Lixiana; Savaysa 3 days 2 hrs  Enoxaparin  Lovenox 24 hours 4 hrs  Eptifibatide Integrillin 8 hours 2 hrs  Fondaparinux  Arixtra 72 hours 12 hrs  Hydroxychloroquine Plaquenil 11 days   Prasugrel Effient 7-10 days 6 hrs  Reteplase Retavase 10 days 10 days  Rivaroxaban Xarelto 3 days 6 hrs  Ticagrelor Brilinta 5-7 days 6 hrs  Ticlopidine Ticlid 10-14 days 2 hrs  Tinzaparin Innohep 24 hours 4 hrs  Tirofiban Aggrastat 8 hours 2 hrs  Warfarin Coumadin 5 days 2 hrs   Other medications with blood-thinning effects  Product indications Generic (Brand) names Note  Cholesterol Lipitor Stop 4 days before procedure  Blood thinner (injectable) Heparin (LMW or LMWH Heparin) Stop 24 hours before procedure  Cancer Ibrutinib (Imbruvica) Stop 7 days before procedure  Malaria/Rheumatoid Hydroxychloroquine (Plaquenil) Stop 11 days before procedure  Thrombolytics  10 days before or after procedures   Over-the-counter (OTC) Products with blood-thinning effects  Product Common names Stop Time  Aspirin > 325 mg Goody Powders, Excedrin, etc. 11 days  Aspirin ? 81 mg  7 days  Fish oil  4 days  Garlic supplements  7 days  Ginkgo biloba  36 hours  Ginseng  24 hours  NSAIDs Ibuprofen, Naprosyn, etc. 3 days  Vitamin E  4 days  ____________________________________________________________________________________________    

## 2022-06-02 NOTE — Progress Notes (Signed)
Safety precautions to be maintained throughout the outpatient stay will include: orient to surroundings, keep bed in low position, maintain call bell within reach at all times, provide assistance with transfer out of bed and ambulation.   Patient did not take BP med this am. Instructed to take with a sip of water before he comes for his appt.

## 2022-06-02 NOTE — Progress Notes (Signed)
PROVIDER NOTE: Information contained herein reflects review and annotations entered in association with encounter. Interpretation of such information and data should be left to medically-trained personnel. Information provided to patient can be located elsewhere in the medical record under "Patient Instructions". Document created using STT-dictation technology, any transcriptional errors that may result from process are unintentional.    Patient: [name] Whitaker  Service Category: E/M  Provider: Gaspar Cola, MD  DOB: April 30, 1951  DOS: 06/02/2022  Referring Provider: Milinda Pointer, MD  MRN: [id number]  Specialty: Interventional Pain Management  PCP: [facility] [place]  Type: Established Patient  Setting: Ambulatory outpatient    Location: Office  Delivery: Face-to-face     HPI  [name], a 72 y.o. year old male, is here today because of his Lumbar facet joint syndrome [M47.816]. [name] Whitaker's primary complain today is Back Pain Last encounter: My last encounter with him was on 05/24/2022. Pertinent problems: 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, L4-5, L5-S1); Lumbar facet joint syndrome (Bilateral) (R>L); DDD (degenerative disc disease), lumbosacral; Chronic hip pain (Bilateral); Chronic low back pain (1ry area of Pain) (Bilateral) (R>L) w/o sciatica; Chronic sacroiliac joint pain (Bilateral) (R>L); Other intervertebral disc degeneration, lumbar region; Osteoarthritis of hip (Right); Arthritis of hip (Right); Osteoarthritis of hips (Bilateral); Somatic dysfunction of sacroiliac joints (Bilateral) (R>L); Other spondylosis, sacral and sacrococcygeal  region; Acute postoperative pain; Migraine with aura, not intractable, without status migrainosus; Primary osteoarthritis, left ankle and foot; Osteoarthritis of left foot; Lumbosacral spondylosis without myelopathy; Spinal stenosis of lumbar region; Grade 1 Anterolisthesis (82m) of lumbar spine of L4/L5; and Statin myopathy on their pertinent problem list. Pain Assessment: Severity of Chronic pain is reported as a 9 /10. Location: Back Lower/left hip. Onset: More than a month ago. Quality: Aching, Constant, Discomfort, Nagging. Timing: Constant. Modifying factor(s): rest. Vitals:  height is 5' 10.5" (1.791 m) and weight is 255 lb (115.7 kg). His temperature is 97.3 F (36.3 C) (abnormal). His blood pressure is 183/112 (abnormal) and his pulse is 82. His respiration is 20 and oxygen saturation is 97%.  BMI: Estimated body mass index is 36.07 kg/m as calculated from the following:   Height as of this encounter: 5' 10.5" (1.791 m).   Weight as of this encounter: 255 lb (115.7 kg).  Reason for encounter:  Was scheduled to come in today for a left lumbar facet radiofrequency ablation #3, as well as a postprocedure evaluation on the radiofrequency ablation done on the right side .  Unfortunately, the patient did not follow our preprocedure instructions and did not take his blood pressure medicine.  As a consequence, his blood pressure today is 183/112.  Since this is an elective procedure, he Whitaker been canceled and rescheduled to a later time.  The patient was instructed to go back on his blood thinner until it is time to stop it again for the upcoming procedure.  We took this opportunity to collect the information from the radiofrequency ablation done on 05/24/2022.  Post-procedure evaluation   Type: Lumbar Facet, Medial Branch Radiofrequency Ablation (RFA) #3  Laterality: Right (-RT)  Level: L3, L4, L5, and S1 Medial Branch Level(s). These levels will denervate the L4-5 and L5-S1 lumbar facet joints.   Imaging: Fluoroscopy-guided  Anesthesia: Local anesthesia (1-2% Lidocaine) Anxiolysis: IV Versed 2.0 mg Sedation: Moderate Sedation Fentanyl 1 mL (50 mcg) DOS: 05/24/2022  Performed by: Gaspar Cola, MD  Pain Score: Pre-procedure: 6 /10 Post-procedure: 0-No pain/10     Effectiveness:  Initial hour after procedure: 100 %. Subsequent 4-6 hours post-procedure: 100 %. Analgesia past initial 6 hours: 80 % (current). Ongoing improvement:  Analgesic: The patient indicated having an ongoing 80% improvement of his low back pain. Function: [name] Whitaker improvement in function ROM: [name] Whitaker Whitaker improvement in ROM  Assessment   Diagnosis Status  1. Lumbar facet joint syndrome (Bilateral) (R>L)   2. Chronic low back pain (1ry area of Pain) (Bilateral) (R>L) w/o sciatica   3. Spondylosis of lumbar region without myelopathy or radiculopathy   4. Lumbar facet hypertrophy (Multilevel)    5. Grade 1 Anterolisthesis (40m) of lumbar spine of L4/L5   6. DDD (degenerative disc disease), lumbosacral   7. Chronic anticoagulation (Xarelto)   8. Postop check    Improving Improved Persistent   Updated Problems: No problems updated.  Plan of Care  Rescheduled today's procedure.  Follow-up plan:   Return for (485m), (ECT): (L) L-FCT RFA #3, (Blood Thinner Protocol).     Interventional Therapies  Risk factors  Considerations:   NOTE: Xarelto Anticoagulation (Stop: 3 days  Restart: 6 hours) HTN   Planned  Pending:   Therapeutic left lumbar facet RFA #3    Under consideration:   Possible right opturator + femoral NB  Possible right opturator + femoral nerve RFA    Completed:   Therapeutic right IA hip joint inj. x2 (04/30/2019) (100/100/50/90-100) Diagnostic bilateral SI joint Blk x2 (03/10/2020) (50/50/100 x 5 wks/0)  Diagnostic bilateral lumbar facet MBB x1 (05/07/2020) (100/100/100 x1 week/>75)  Therapeutic left lumbar facet RFA x2 (06/29/2021)  (100/100/100/100)  Therapeutic right lumbar facet RFA x3 (05/24/2022) (100/100/80/80)    Palliative options:   Palliative right IA hip joint inj.  Palliative bilateral SI joint Blk  Therapeutic/palliative lumbar facet RFA      Recent Visits Date Type Provider Dept  05/24/22 Procedure visit NaMilinda PointerMD [facility]  05/02/22 Office Visit NaMilinda PointerMD [facility]  Showing recent visits within past 90 days and meeting all other requirements Today's Visits Date Type Provider Dept  06/02/22 Procedure visit NaMilinda PointerMD [facility]  Showing today's visits and meeting all other requirements Future Appointments Date Type Provider Dept  [facility]16[facility] Appointment Na[facility]inda PointerMD [facility]  Showing future appointments within next 90 days and meeting all other requirements   Note by: FrGaspar ColaMD Date: 06/02/2022; Time: 8:44 AM

## 2022-06-12 NOTE — Progress Notes (Unsigned)
PROVIDER NOTE: Interpretation of information contained herein should be left to medically-trained personnel. Specific patient instructions are provided elsewhere under "Patient Instructions" section of medical record. This document was created in part using STT-dictation technology, any transcriptional errors that may result from this process are unintentional.  Patient: [name] Whitaker Type: Established DOB: 1950/11/18 MRN: [id number] PCP: [facility] [place]  Service: Procedure DOS: 06/16/2022 Setting: Ambulatory Location: Ambulatory outpatient facility Delivery: Face-to-face Provider: Gaspar Cola, MD Specialty: Interventional Pain Management Specialty designation: 09 Location: Outpatient facility Ref. Prov.: [facility]    Procedure:           Type: Lumbar Facet, Medial Branch Radiofrequency Ablation (RFA) #3  Laterality: Left (-LT)  Level: L3, L4, L5, and S1 Medial Branch Level(s). These levels will denervate the L4-5 and L5-S1 lumbar facet joints.  Imaging: Fluoroscopy-guided         Anesthesia: Local anesthesia (1-2% Lidocaine) Anxiolysis: IV Versed 2.0 mg Sedation: Moderate Sedation Fentanyl 1 mL (50 mcg) DOS: 06/16/2022  Performed by: Gaspar Cola, MD  Purpose: Therapeutic/Palliative Indications: Low back pain severe enough to impact quality of life or function. Indications: 1. Lumbar facet joint syndrome (Bilateral) (R>L)   2. Spondylosis of lumbar region without myelopathy or radiculopathy   3. Lumbosacral spondylosis without myelopathy   4. Lumbar facet hypertrophy (Multilevel)    5. Grade 1 Anterolisthesis (35m) of lumbar spine of L4/L5   6. DDD (degenerative disc disease), lumbosacral   7. Chronic low back pain (1ry area of Pain) (Bilateral) (R>L) w/o sciatica   8. Chronic anticoagulation (Xarelto)    Mr. CSondgerothhas been dealing with the above chronic pain for longer than three months and has either failed to respond,  was unable to tolerate, or simply did not get enough benefit from other more conservative therapies including, but not limited to: 1. Over-the-counter medications 2. Anti-inflammatory medications 3. Muscle relaxants 4. Membrane stabilizers 5. Opioids 6. Physical therapy and/or chiropractic manipulation 7. Modalities (Heat, ice, etc.) 8. Invasive techniques such as nerve blocks. Mr. CMoreyhas attained more than 50% relief of the pain from a series of diagnostic injections conducted in separate occasions.  Pain Score: Pre-procedure: 7 /10 Post-procedure: 0-No pain/10     Position / Prep / Materials:  Position: Prone  Prep solution: DuraPrep (Iodine Povacrylex [0.7% available iodine] and Isopropyl Alcohol, 74% w/w) Prep Area: Entire Lumbosacral Region (Lower back from mid-thoracic region to end of tailbone and from flank to flank.) Materials:  Tray: RFA (Radiofrequency) tray Needle(s):  Type: RFA (Teflon-coated radiofrequency ablation needles) Gauge (G): 20  Length: Long (15cm) Qty: 5  Pre-op H&P Assessment:  Mr. CTrombettais a 72y.o. (year old), male patient, seen today for interventional treatment. He  has a past surgical history that includes Appendectomy; Nasal septum surgery; Tonsillectomy; Testicle surgery; Cardiac catheterization; Back surgery; Cataract extraction w/PHACO (Right, 10/20/2020); and Cataract extraction w/PHACO (Left, 11/03/2020). Mr. CDoylehas a current medication list which includes the following prescription(s): acetaminophen, allopurinol, ascorbic acid, candesartan, chlorthalidone, cholecalciferol, cyclobenzaprine, dulaglutide, epinephrine, famotidine, finasteride, fluticasone, gabapentin, hydrocodone-acetaminophen, [START ON 06/23/2022] hydrocodone-acetaminophen, hydrocortisone, ketoconazole, potassium chloride sa, propranolol, propranolol, rosuvastatin, topiramate, and rivaroxaban, and the following Facility-Administered Medications: fentanyl and  pentafluoroprop-tetrafluoroeth. His primarily concern today is the Back Pain  Initial Vital Signs:  Pulse/HCG Rate: 77ECG Heart Rate: 75 (NSR) Temp: (!) 97.3 F (36.3 C) Resp: 18 BP: (!) 151/78 SpO2: 99 %  BMI: Estimated body mass index is 35.79 kg/m as calculated from the following:  Height as of this encounter: 5' 10.5" (1.791 m).   Weight as of this encounter: 253 lb (114.8 kg).  Risk Assessment: Allergies: Reviewed. He is allergic to penicillins, amlodipine, amlodipine besylate, atorvastatin, erenumab-aooe, lisinopril, penicillin g, and simvastatin.  Allergy Precautions: None required Coagulopathies: Reviewed. None identified.  Blood-thinner therapy: None at this time Active Infection(s): Reviewed. None identified. Mr. Saulters is afebrile  Site Confirmation: Mr. Tijerina was asked to confirm the procedure and laterality before marking the site Procedure checklist: Completed Consent: Before the procedure and under the influence of no sedative(s), amnesic(s), or anxiolytics, the patient was informed of the treatment options, risks and possible complications. To fulfill our ethical and legal obligations, as recommended by the American Medical Association's Code of Ethics, I have informed the patient of my clinical impression; the nature and purpose of the treatment or procedure; the risks, benefits, and possible complications of the intervention; the alternatives, including doing nothing; the risk(s) and benefit(s) of the alternative treatment(s) or procedure(s); and the risk(s) and benefit(s) of doing nothing. The patient was provided information about the general risks and possible complications associated with the procedure. These may include, but are not limited to: failure to achieve desired goals, infection, bleeding, organ or nerve damage, allergic reactions, paralysis, and death. In addition, the patient was informed of those risks and complications associated to Spine-related  procedures, such as failure to decrease pain; infection (i.e.: Meningitis, epidural or intraspinal abscess); bleeding (i.e.: epidural hematoma, subarachnoid hemorrhage, or any other type of intraspinal or peri-dural bleeding); organ or nerve damage (i.e.: Any type of peripheral nerve, nerve root, or spinal cord injury) with subsequent damage to sensory, motor, and/or autonomic systems, resulting in permanent pain, numbness, and/or weakness of one or several areas of the body; allergic reactions; (i.e.: anaphylactic reaction); and/or death. Furthermore, the patient was informed of those risks and complications associated with the medications. These include, but are not limited to: allergic reactions (i.e.: anaphylactic or anaphylactoid reaction(s)); adrenal axis suppression; blood sugar elevation that in diabetics may result in ketoacidosis or comma; water retention that in patients with history of congestive heart failure may result in shortness of breath, pulmonary edema, and decompensation with resultant heart failure; weight gain; swelling or edema; medication-induced neural toxicity; particulate matter embolism and blood vessel occlusion with resultant organ, and/or nervous system infarction; and/or aseptic necrosis of one or more joints. Finally, the patient was informed that Medicine is not an exact science; therefore, there is also the possibility of unforeseen or unpredictable risks and/or possible complications that may result in a catastrophic outcome. The patient indicated having understood very clearly. We have given the patient no guarantees and we have made no promises. Enough time was given to the patient to ask questions, all of which were answered to the patient's satisfaction. Mr. Tapp has indicated that he wanted to continue with the procedure. Attestation: I, the ordering provider, attest that I have discussed with the patient the benefits, risks, side-effects, alternatives, likelihood of  achieving goals, and potential problems during recovery for the procedure that I have provided informed consent. Date  Time: 06/16/2022  8:02 AM  Pre-Procedure Preparation:  Monitoring: As per clinic protocol. Respiration, ETCO2, SpO2, BP, heart rate and rhythm monitor placed and checked for adequate function Safety Precautions: Patient was assessed for positional comfort and pressure points before starting the procedure. Time-out: I initiated and conducted the "Time-out" before starting the procedure, as per protocol. The patient was asked to participate by confirming the accuracy of the "  Time Out" information. Verification of the correct person, site, and procedure were performed and confirmed by me, the nursing staff, and the patient. "Time-out" conducted as per Joint Commission's Universal Protocol (UP.01.01.01). Time: 0833  Description of Procedure:          Laterality: Left Levels:  L3, L4, L5, and S1 Medial Branch Level(s). Safety Precautions: Aspiration looking for blood return was conducted prior to all injections. At no point did we inject any substances, as a needle was being advanced. Before injecting, the patient was told to immediately notify me if he was experiencing any new onset of "ringing in the ears, or metallic taste in the mouth". No attempts were made at seeking any paresthesias. Safe injection practices and needle disposal techniques used. Medications properly checked for expiration dates. SDV (single dose vial) medications used. After the completion of the procedure, all disposable equipment used was discarded in the proper designated medical waste containers. Local Anesthesia: Protocol guidelines were followed. The patient was positioned over the fluoroscopy table. The area was prepped in the usual manner. The time-out was completed. The target area was identified using fluoroscopy. A 12-in long, straight, sterile hemostat was used with fluoroscopic guidance to locate the  targets for each level blocked. Once located, the skin was marked with an approved surgical skin marker. Once all sites were marked, the skin (epidermis, dermis, and hypodermis), as well as deeper tissues (fat, connective tissue and muscle) were infiltrated with a small amount of a short-acting local anesthetic, loaded on a 10cc syringe with a 25G, 1.5-in  Needle. An appropriate amount of time was allowed for local anesthetics to take effect before proceeding to the next step.  Technical description of process:  Radiofrequency Ablation (RFA)  L3 Medial Branch Nerve RFA: The target area for the L3 medial branch is at the junction of the postero-lateral aspect of the superior articular process and the superior, posterior, and medial edge of the transverse process of L4. Under fluoroscopic guidance, a Radiofrequency needle was inserted until contact was made with os over the superior postero-lateral aspect of the pedicular shadow (target area). Sensory and motor testing was conducted to properly adjust the position of the needle. Once satisfactory placement of the needle was achieved, the numbing solution was slowly injected after negative aspiration for blood. 2.0 mL of the nerve block solution was injected without difficulty or complication. After waiting for at least 3 minutes, the ablation was performed. Once completed, the needle was removed intact. L4 Medial Branch Nerve RFA: The target area for the L4 medial branch is at the junction of the postero-lateral aspect of the superior articular process and the superior, posterior, and medial edge of the transverse process of L5. Under fluoroscopic guidance, a Radiofrequency needle was inserted until contact was made with os over the superior postero-lateral aspect of the pedicular shadow (target area). Sensory and motor testing was conducted to properly adjust the position of the needle. Once satisfactory placement of the needle was achieved, the numbing  solution was slowly injected after negative aspiration for blood. 2.0 mL of the nerve block solution was injected without difficulty or complication. After waiting for at least 3 minutes, the ablation was performed. Once completed, the needle was removed intact. L5 Medial Branch Nerve RFA: The target area for the L5 medial branch is at the junction of the postero-lateral aspect of the superior articular process of S1 and the superior, posterior, and medial edge of the sacral ala. Under fluoroscopic guidance, a Radiofrequency needle  was inserted until contact was made with os over the superior postero-lateral aspect of the pedicular shadow (target area). Sensory and motor testing was conducted to properly adjust the position of the needle. Once satisfactory placement of the needle was achieved, the numbing solution was slowly injected after negative aspiration for blood. 2.0 mL of the nerve block solution was injected without difficulty or complication. After waiting for at least 3 minutes, the ablation was performed. Once completed, the needle was removed intact. S1 Medial Branch Nerve RFA: The target area for the S1 medial branch is located inferior to the junction of the S1 superior articular process and the L5 inferior articular process, posterior, inferior, and lateral to the 6 o'clock position of the L5-S1 facet joint, just superior to the S1 posterior foramen. Under fluoroscopic guidance, the Radiofrequency needle was advanced until contact was made with os over the Target area. Sensory and motor testing was conducted to properly adjust the position of the needle. Once satisfactory placement of the needle was achieved, the numbing solution was slowly injected after negative aspiration for blood. 2.0 mL of the nerve block solution was injected without difficulty or complication. After waiting for at least 3 minutes, the ablation was performed. Once completed, the needle was removed intact. Radiofrequency  lesioning (ablation):  Radiofrequency Generator: Medtronic AccurianTM AG 1000 RF Generator Sensory Stimulation Parameters: 50 Hz was used to locate & identify the nerve, making sure that the needle was positioned such that there was no sensory stimulation below 0.3 V or above 0.7 V. Motor Stimulation Parameters: 2 Hz was used to evaluate the motor component. Care was taken not to lesion any nerves that demonstrated motor stimulation of the lower extremities at an output of less than 2.5 times that of the sensory threshold, or a maximum of 2.0 V. Lesioning Technique Parameters: Standard Radiofrequency settings. (Not bipolar or pulsed.) Temperature Settings: 80 degrees C Lesioning time: 60 seconds Intra-operative Compliance: Compliant  Once the entire procedure was completed, the treated area was cleaned, making sure to leave some of the prepping solution back to take advantage of its long term bactericidal properties.    Illustration of the posterior view of the lumbar spine and the posterior neural structures. Laminae of L2 through S1 are labeled. DPRL5, dorsal primary ramus of L5; DPRS1, dorsal primary ramus of S1; DPR3, dorsal primary ramus of L3; FJ, facet (zygapophyseal) joint L3-L4; I, inferior articular process of L4; LB1, lateral branch of dorsal primary ramus of L1; IAB, inferior articular branches from L3 medial branch (supplies L4-L5 facet joint); IBP, intermediate branch plexus; MB3, medial branch of dorsal primary ramus of L3; NR3, third lumbar nerve root; S, superior articular process of L5; SAB, superior articular branches from L4 (supplies L4-5 facet joint also); TP3, transverse process of L3.  Vitals:   06/16/22 0903 06/16/22 0910 06/16/22 0920 06/16/22 0925  BP: 114/88 (!) 139/92 137/88 (!) 141/86  Pulse:      Resp: '18 13 15 16  '$ Temp:      TempSrc:      SpO2: 100% 95% 97% 97%  Weight:      Height:        Start Time: 0833 hrs. End Time: 0903 hrs.  Imaging Guidance  (Spinal):          Type of Imaging Technique: Fluoroscopy Guidance (Spinal) Indication(s): Assistance in needle guidance and placement for procedures requiring needle placement in or near specific anatomical locations not easily accessible without such assistance. Exposure Time: Please see  nurses notes. Contrast: None used. Fluoroscopic Guidance: I was personally present during the use of fluoroscopy. "Tunnel Vision Technique" used to obtain the best possible view of the target area. Parallax error corrected before commencing the procedure. "Direction-depth-direction" technique used to introduce the needle under continuous pulsed fluoroscopy. Once target was reached, antero-posterior, oblique, and lateral fluoroscopic projection used confirm needle placement in all planes. Images permanently stored in EMR. Interpretation: No contrast injected. I personally interpreted the imaging intraoperatively. Adequate needle placement confirmed in multiple planes. Permanent images saved into the patient's record.  Antibiotic Prophylaxis:   Anti-infectives (From admission, onward)    None      Indication(s): None identified  Post-operative Assessment:  Post-procedure Vital Signs:  Pulse/HCG Rate: 7364 Temp: (!) 97.3 F (36.3 C) Resp: 16 BP: (!) 141/86 SpO2: 97 %  EBL: None  Complications: No immediate post-treatment complications observed by team, or reported by patient.  Note: The patient tolerated the entire procedure well. A repeat set of vitals were taken after the procedure and the patient was kept under observation following institutional policy, for this type of procedure. Post-procedural neurological assessment was performed, showing return to baseline, prior to discharge. The patient was provided with post-procedure discharge instructions, including a section on how to identify potential problems. Should any problems arise concerning this procedure, the patient was given instructions to  immediately contact us, at any time, without hesitation. In any case, we plan to contact the patient by telephone for a follow-up status report regarding this interventional procedure.  Comments:  No additional relevant information.  Plan of Care  Orders:  Orders Placed This Encounter  Procedures   Radiofrequency,Lumbar    Scheduling Instructions:     Side(s): Left-sided     Level: L4-5 and L5-S1 Facets (L3, L4, L5, and S1 Medial Branch)     Sedation: With Sedation.     Timeframe: Today    Order Specific Question:   Where will this procedure be performed?    Answer:   [facility]   [facility] C-ARM 1-60 MIN NO REPORT    Intraoperative interpretation by procedural physician at [place].    Standing Status:   Standing    Number of Occurrences:   1    Order Specific Question:   Reason for exam:    Answer:   Assistance in needle guidance and placement for procedures requiring needle placement in or near specific anatomical locations not easily accessible without such assistance.   Informed Consent Details: Physician/Practitioner Attestation; Transcribe to consent form and obtain patient signature    Nursing Order: Transcribe to consent form and obtain patient signature. Note: Always confirm laterality of pain with Mr. Monday, before procedure.    Order Specific Question:   Physician/Practitioner attestation of informed consent for procedure/surgical case    Answer:   I, the physician/practitioner, attest that I have discussed with the patient the benefits, risks, side effects, alternatives, likelihood of achieving goals and potential problems during recovery for the procedure that I have provided informed consent.    Order Specific Question:   Procedure    Answer:   Lumbar Facet Radiofrequency Ablation    Order Specific Question:   Physician/Practitioner performing the procedure    Answer:   Ludwika Rodd A. Dossie Arbour, MD    Order Specific Question:    Indication/Reason    Answer:   Low Back Pain, with our without leg pain, due to Facet Joint Arthralgia (Joint Pain) known as Lumbar Facet Syndrome, secondary  to Lumbar, and/or Lumbosacral Spondylosis (Arthritis of the Spine), without myelopathy or radiculopathy (Nerve Damage).   Care order/instruction: Please confirm that the patient has stopped the Xarelto (Rivaroxaban) x 3 days prior to procedure or surgery.    Please confirm that the patient has stopped the Xarelto (Rivaroxaban) x 3 days prior to procedure or surgery.    Standing Status:   Standing    Number of Occurrences:   1   Provide equipment / supplies at bedside    Procedure tray: "Radiofrequency Tray" Additional material: Large hemostat (x1); Small hemostat (x1); Towels (x8); 4x4 sterile sponge pack (x1) Needle type: Teflon-coated Radiofrequency Needle (Disposable  single use) Size: Long Quantity: 5    Standing Status:   Standing    Number of Occurrences:   1    Order Specific Question:   Specify    Answer:   Radiofrequency Tray   Bleeding precautions    Standing Status:   Standing    Number of Occurrences:   1   Chronic Opioid Analgesic:  No opioid analgesics prescribed by our practice. Highest recorded MME/day: 26.67 mg/day MME/day: 0 mg/day   Medications ordered for procedure: Meds ordered this encounter  Medications   lidocaine (XYLOCAINE) 2 % (with pres) injection 400 mg   pentafluoroprop-tetrafluoroeth (GEBAUERS) aerosol   lactated ringers infusion   midazolam (VERSED) 5 MG/5ML injection 0.5-2 mg    Make sure Flumazenil is available in the pyxis when using this medication. If oversedation occurs, administer 0.2 mg IV over 15 sec. If after 45 sec no response, administer 0.2 mg again over 1 min; may repeat at 1 min intervals; not to exceed 4 doses (1 mg)   fentaNYL (SUBLIMAZE) injection 25-50 mcg    Make sure Narcan is available in the pyxis when using this medication. In the event of respiratory depression (RR<  8/min): Titrate NARCAN (naloxone) in increments of 0.1 to 0.2 mg IV at 2-3 minute intervals, until desired degree of reversal.   ropivacaine (PF) 2 mg/mL (0.2%) (NAROPIN) injection 9 mL   triamcinolone acetonide (KENALOG-40) injection 40 mg   HYDROcodone-acetaminophen (NORCO/VICODIN) 5-325 MG tablet    Sig: Take 1 tablet by mouth every 8 (eight) hours as needed for up to 7 days for severe pain. Must last 7 days.    Dispense:  21 tablet    Refill:  0    For acute post-operative pain. Not to be refilled. Must last 7 days.   HYDROcodone-acetaminophen (NORCO/VICODIN) 5-325 MG tablet    Sig: Take 1 tablet by mouth every 8 (eight) hours as needed for up to 7 days for severe pain. Must last for 7 days.    Dispense:  21 tablet    Refill:  0    For acute post-operative pain. Not to be refilled. Must last 7 days.   Medications administered: We administered lidocaine, lactated ringers, midazolam, fentaNYL, ropivacaine (PF) 2 mg/mL (0.2%), and triamcinolone acetonide.  See the medical record for exact dosing, route, and time of administration.  Follow-up plan:   Return in about 6 weeks (around 07/28/2022) for Proc-day (T,Th), (VV), (PPE).       Interventional Therapies  Risk factors  Considerations:   NOTE: Xarelto Anticoagulation (Stop: 3 days  Restart: 6 hours) HTN   Planned  Pending:   Therapeutic left lumbar facet RFA #3    Under consideration:   Possible right opturator + femoral NB  Possible right opturator + femoral nerve RFA    Completed:   Therapeutic right IA  hip joint inj. x2 (04/30/2019) (100/100/50/90-100) Diagnostic bilateral SI joint Blk x2 (03/10/2020) (50/50/100 x 5 wks/0)  Diagnostic bilateral lumbar facet MBB x1 (05/07/2020) (100/100/100 x1 week/>75)  Therapeutic left lumbar facet RFA x2 (06/29/2021) (100/100/100/100)  Therapeutic right lumbar facet RFA x3 (05/24/2022) (100/100/80/80)    Palliative options:   Palliative right IA hip joint inj.  Palliative bilateral SI  joint Blk  Therapeutic/palliative lumbar facet RFA       Recent Visits Date Type Provider Dept  06/02/22 Procedure visit Milinda Pointer, MD [facility]  05/24/22 Procedure visit Milinda Pointer, MD [facility]  05/02/22 Office Visit Milinda Pointer, MD [facility]  Showing recent visits within past 90 days and meeting all other requirements Today's Visits Date Type Provider Dept  06/16/22 Procedure visit Milinda Pointer, MD [facility]  Showing today's visits and meeting all other requirements Future Appointments Date Type Provider Dept  07/05/22 Appointment Milinda Pointer, [facility]  07/28/22 Appointment Milinda Pointer, MD [facility]  Showing future appointments within next 90 days and meeting all other requirements  Disposition: Discharge home  Discharge (Date  Time): 06/16/2022; 0926 hrs.   Primary Care Physician: [facility] @ [place] Location: [facility] Note by: Gaspar Cola, MD Date: 06/16/2022; Time: 9:46 AM  Disclaimer:  Medicine is not an Chief Strategy Officer. The only guarantee in medicine is that nothing is guaranteed. It is important to note that the decision to proceed with this intervention was based on the information collected from the patient. The Data and conclusions were drawn from the patient's questionnaire, the interview, and the physical examination. Because the information was provided in large part by the patient, it cannot be guaranteed that it has not been purposely or unconsciously manipulated. Every effort has been made to obtain as much relevant data as possible for this evaluation. It is important to note that the conclusions that lead to this procedure are derived in large part from the available data. Always take into account that the treatment will also be dependent on availability of resources and existing treatment  guidelines, considered by other Pain Management Practitioners as being common knowledge and practice, at the time of the intervention. For Medico-Legal purposes, it is also important to point out that variation in procedural techniques and pharmacological choices are the acceptable norm. The indications, contraindications, technique, and results of the above procedure should only be interpreted and judged by a Board-Certified Interventional Pain Specialist with extensive familiarity and expertise in the same exact procedure and technique.

## 2022-06-13 DIAGNOSIS — M5481 Occipital neuralgia: Secondary | ICD-10-CM | POA: Diagnosis not present

## 2022-06-13 DIAGNOSIS — D352 Benign neoplasm of pituitary gland: Secondary | ICD-10-CM | POA: Diagnosis not present

## 2022-06-13 DIAGNOSIS — E1169 Type 2 diabetes mellitus with other specified complication: Secondary | ICD-10-CM | POA: Diagnosis not present

## 2022-06-13 DIAGNOSIS — E2749 Other adrenocortical insufficiency: Secondary | ICD-10-CM | POA: Diagnosis not present

## 2022-06-13 DIAGNOSIS — E538 Deficiency of other specified B group vitamins: Secondary | ICD-10-CM | POA: Diagnosis not present

## 2022-06-13 DIAGNOSIS — I10 Essential (primary) hypertension: Secondary | ICD-10-CM | POA: Diagnosis not present

## 2022-06-13 DIAGNOSIS — E221 Hyperprolactinemia: Secondary | ICD-10-CM | POA: Diagnosis not present

## 2022-06-14 ENCOUNTER — Telehealth: Payer: Self-pay

## 2022-06-14 NOTE — Telephone Encounter (Signed)
Sent message re elevators

## 2022-06-16 ENCOUNTER — Ambulatory Visit: Payer: No Typology Code available for payment source | Attending: Pain Medicine | Admitting: Pain Medicine

## 2022-06-16 ENCOUNTER — Ambulatory Visit
Admission: RE | Admit: 2022-06-16 | Discharge: 2022-06-16 | Disposition: A | Discharge status: Home / Self Care | Payer: No Typology Code available for payment source | Source: Ambulatory Visit | Attending: Pain Medicine | Admitting: Pain Medicine

## 2022-06-16 ENCOUNTER — Encounter: Payer: Self-pay | Admitting: Pain Medicine

## 2022-06-16 VITALS — BP 141/86 | HR 73 | Temp 97.3°F | Resp 16 | Ht 70.5 in | Wt 253.0 lb

## 2022-06-16 DIAGNOSIS — M47817 Spondylosis without myelopathy or radiculopathy, lumbosacral region: Secondary | ICD-10-CM

## 2022-06-16 DIAGNOSIS — M47816 Spondylosis without myelopathy or radiculopathy, lumbar region: Secondary | ICD-10-CM

## 2022-06-16 DIAGNOSIS — M4316 Spondylolisthesis, lumbar region: Secondary | ICD-10-CM | POA: Insufficient documentation

## 2022-06-16 DIAGNOSIS — G8929 Other chronic pain: Secondary | ICD-10-CM | POA: Diagnosis present

## 2022-06-16 DIAGNOSIS — G8918 Other acute postprocedural pain: Secondary | ICD-10-CM | POA: Diagnosis present

## 2022-06-16 DIAGNOSIS — M5137 Other intervertebral disc degeneration, lumbosacral region: Secondary | ICD-10-CM

## 2022-06-16 DIAGNOSIS — Z7901 Long term (current) use of anticoagulants: Secondary | ICD-10-CM

## 2022-06-16 DIAGNOSIS — M545 Low back pain, unspecified: Secondary | ICD-10-CM | POA: Insufficient documentation

## 2022-06-16 MED ORDER — MIDAZOLAM HCL 5 MG/5ML IJ SOLN
0.5000 mg | Freq: Once | INTRAMUSCULAR | Status: AC
  Administered 2022-06-16: 2 mg via INTRAVENOUS

## 2022-06-16 MED ORDER — FENTANYL CITRATE (PF) 100 MCG/2ML IJ SOLN
INTRAMUSCULAR | Status: AC
  Filled 2022-06-16: qty 2

## 2022-06-16 MED ORDER — MIDAZOLAM HCL 5 MG/5ML IJ SOLN
INTRAMUSCULAR | Status: AC
  Filled 2022-06-16: qty 5

## 2022-06-16 MED ORDER — ROPIVACAINE HCL 2 MG/ML IJ SOLN
INTRAMUSCULAR | Status: AC
  Filled 2022-06-16: qty 20

## 2022-06-16 MED ORDER — HYDROCODONE-ACETAMINOPHEN 5-325 MG PO TABS: 1.0000 | ORAL_TABLET | Freq: Three times a day (TID) | ORAL | 0 refills | Status: AC | PRN

## 2022-06-16 MED ORDER — LIDOCAINE HCL 2 % IJ SOLN
INTRAMUSCULAR | Status: AC
  Filled 2022-06-16: qty 20

## 2022-06-16 MED ORDER — PENTAFLUOROPROP-TETRAFLUOROETH EX AERO
INHALATION_SPRAY | Freq: Once | CUTANEOUS | Status: DC
  Filled 2022-06-16: qty 116

## 2022-06-16 MED ORDER — LACTATED RINGERS IV SOLN: Freq: Once | INTRAVENOUS | Status: AC

## 2022-06-16 MED ORDER — FENTANYL CITRATE (PF) 100 MCG/2ML IJ SOLN
25.0000 ug | INTRAMUSCULAR | Status: DC | PRN
  Administered 2022-06-16: 50 ug via INTRAVENOUS

## 2022-06-16 MED ORDER — LIDOCAINE HCL 2 % IJ SOLN
20.0000 mL | Freq: Once | INTRAMUSCULAR | Status: AC
  Administered 2022-06-16: 400 mg

## 2022-06-16 MED ORDER — ROPIVACAINE HCL 2 MG/ML IJ SOLN
9.0000 mL | Freq: Once | INTRAMUSCULAR | Status: AC
  Administered 2022-06-16: 9 mL via PERINEURAL

## 2022-06-16 MED ORDER — TRIAMCINOLONE ACETONIDE 40 MG/ML IJ SUSP
40.0000 mg | Freq: Once | INTRAMUSCULAR | Status: AC
  Administered 2022-06-16: 40 mg

## 2022-06-16 MED ORDER — TRIAMCINOLONE ACETONIDE 40 MG/ML IJ SUSP
INTRAMUSCULAR | Status: AC
  Filled 2022-06-16: qty 1

## 2022-06-16 NOTE — Patient Instructions (Addendum)
____________________________________________________________________________________________  Virtual Visits   ID our calls: Add these numbers to your list of contacts on your smart phone. Label it as "PAIN Management" Nursing: [phone] (Main) Dr. Dossie Arbour: [phone]   What is a "Virtual Visit"? It is an Automotive engineer (medical visit) that takes place on real time (NOT TEXT or E-MAIL) over the telephone or computer device (desktop, laptop, tablet, smart phone, etc.). It allows for more communication flexibility between the patient and the healthcare provider.  Who decides when these types of visits will be used? The physician.  Who is eligible for these types of visits? Only those patients that can be reliably reached over the telephone.  What do you mean by reliably? We do not have time to call everyone multiple times, therefore those patients that tend to screen calls and then call back later are not suitable candidates for this system. We all hate telemarketers and "Robocalls".  We understand how people are reluctant to pickup on calls from "unknown numbers", therefore, we suggest you add our numbers to your list of contacts. This way, you should be able to identify our calls. All of our numbers are available above.   Who is not eligible? This option is not appropriate for medication management. Patients on controlled substances have to come in for "Face-to-Face" encounters. Monitoring of these substances is mandatory. Virtual visits do not allow for unannounced drug screening tests or pill counts. Not bringing your pills, or the empty bottles, may result in no refill.  When will this type of visits be used? For follow-up after procedures on established patients, as long as they have had the same procedure done before.  Whenever you are physically unable to attend a regular appointment.   Can I request my medication visit to be "Virtual"? Yes. Available on  a limited basis, only if you are unable to physically attend your appointment. However, you may only receive a 30-day prescription. Abuse of this option may result in discontinuation of medication due to inability to properly monitor the therapy.  When will I be called?  You will receive an initial call from [phone], from our nursing staff, one business day prior to your appointment. (For Monday appointments you will be called on Friday.) The purpose of this call is to review your medications and the results of any recent procedure.  If the nursing staff is unable to make contact, your virtual encounter may be canceled and rescheduled to a face-to-face visit.  Your provider will call you on the day of your appointment.  At what time will I be called? Providers will call whenever there is time available. Do not expect calls at any specific time. On the schedule, you will have an appointment time assigned to you however, this is seldom accurate. This is done simply to keep a list of patients to be called. Be advised that calls may come at anytime during the day. Calls start as early as 8:00 AM and go as late as 8:00 PM. This will depend on provider availability. The system is not perfect. If this is inconvenient for you, please request to be changed to an "in-person" appointment.   ____________________________________________________________________________________________    ___________________________________________________________________________________________  Post-Radiofrequency (RF) Discharge Instructions  You have just completed a Radiofrequency Neurotomy.  The following instructions will provide you with information and guidelines for self-care upon discharge.  If at any time you have questions or concerns please call your physician. DO NOT DRIVE YOURSELF!!  Instructions: Apply  ice: Fill a plastic sandwich bag with crushed ice. Cover it with a small towel and apply to injection  site. Apply for 15 minutes then remove x 15 minutes. Repeat sequence on day of procedure, until you go to bed. The purpose is to minimize swelling and discomfort after procedure. Apply heat: Apply heat to procedure site starting the day following the procedure. The purpose is to treat any soreness and discomfort from the procedure. Food intake: No eating limitations, unless stipulated above.  Nevertheless, if you have had sedation, you may experience some nausea.  In this case, it may be wise to wait at least two hours prior to resuming regular diet. Physical activities: Keep activities to a minimum for the first 8 hours after the procedure. For the first 24 hours after the procedure, do not drive a motor vehicle,  Operate heavy machinery, power tools, or handle any weapons.  Consider walking with the use of an assistive device or accompanied by an adult for the first 24 hours.  Do not drink alcoholic beverages including beer.  Do not make any important decisions or sign any legal documents. Go home and rest today.  Resume activities tomorrow, as tolerated.  Use caution in moving about as you may experience mild leg weakness.  Use caution in cooking, use of household electrical appliances and climbing steps. Driving: If you have received any sedation, you are not allowed to drive for 24 hours after your procedure. Blood thinner: Restart your blood thinner 6 hours after your procedure. (Only for those taking blood thinners) Insulin: As soon as you can eat, you may resume your normal dosing schedule. (Only for those taking insulin) Medications: May resume pre-procedure medications.  Do not take any drugs, other than what has been prescribed to you. Infection prevention: Keep procedure site clean and dry. Post-procedure Pain Diary: Extremely important that this be done correctly and accurately. Recorded information will be used to determine the next step in treatment. Pain evaluated is that of treated area  only. Do not include pain from an untreated area. Complete every hour, on the hour, for the initial 8 hours. Set an alarm to help you do this part accurately. Do not go to sleep and have it completed later. It will not be accurate. Follow-up appointment: Keep your follow-up appointment after the procedure. Usually 2-6 weeks after radiofrequency. Bring you pain diary. The information collected will be essential for your long-term care.   Expect: From numbing medicine (AKA: Local Anesthetics): Numbness or decrease in pain. Onset: Full effect within 15 minutes of injected. Duration: It will depend on the type of local anesthetic used. On the average, 1 to 8 hours.  From steroids (when added): Decrease in swelling or inflammation. Once inflammation is improved, relief of the pain will follow. Onset of benefits: Depends on the amount of swelling present. The more swelling, the longer it will take for the benefits to be seen. In some cases, up to 10 days. Duration: Steroids will stay in the system x 2 weeks. Duration of benefits will depend on multiple posibilities including persistent irritating factors. From procedure: Some discomfort is to be expected once the numbing medicine wears off. In the case of radiofrequency procedures, this may last as long as 6 weeks. Additional post-procedure pain medication is provided for this. Discomfort is minimized if ice and heat are applied as instructed.  Call if: You experience numbness and weakness that gets worse with time, as opposed to wearing off. He experience any unusual   bleeding, difficulty breathing, or loss of the ability to control your bowel and bladder. (This applies to Spinal procedures only) You experience any redness, swelling, heat, red streaks, elevated temperature, fever, or any other signs of a possible infection.  Emergency Numbers: [place] hours (Monday - Thursday, 8:00 AM - 4:00 PM) (Friday, 9:00 AM - 12:00 Noon): [phone]  [id number] After hours: [phone] ____________________________________________________________________________________________    ____________________________________________________________________________________________  Blood Thinners  IMPORTANT NOTICE:  If you take any of these, make sure to notify the nursing staff.  Failure to do so may result in injury.  Recommended time intervals to stop and restart blood-thinners, before & after invasive procedures  Generic Name Brand Name Pre-procedure. Stop this long before procedure. Post-procedure. Minimum waiting period before restarting.  Abciximab Reopro 15 days 2 hrs  Alteplase Activase 10 days 10 days  Anagrelide Agrylin    Apixaban Eliquis 3 days 6 hrs  Cilostazol Pletal 3 days 5 hrs  Clopidogrel Plavix 7-10 days 2 hrs  Dabigatran Pradaxa 5 days 6 hrs  Dalteparin Fragmin 24 hours 4 hrs  Dipyridamole Aggrenox 11days 2 hrs  Edoxaban Lixiana; Savaysa 3 days 2 hrs  Enoxaparin  Lovenox 24 hours 4 hrs  Eptifibatide Integrillin 8 hours 2 hrs  Fondaparinux  Arixtra 72 hours 12 hrs  Hydroxychloroquine Plaquenil 11 days   Prasugrel Effient 7-10 days 6 hrs  Reteplase Retavase 10 days 10 days  Rivaroxaban Xarelto 3 days 6 hrs  Ticagrelor Brilinta 5-7 days 6 hrs  Ticlopidine Ticlid 10-14 days 2 hrs  Tinzaparin Innohep 24 hours 4 hrs  Tirofiban Aggrastat 8 hours 2 hrs  Warfarin Coumadin 5 days 2 hrs   Other medications with blood-thinning effects  Product indications Generic (Brand) names Note  Cholesterol Lipitor Stop 4 days before procedure  Blood thinner (injectable) Heparin (LMW or LMWH Heparin) Stop 24 hours before procedure  Cancer Ibrutinib (Imbruvica) Stop 7 days before procedure  Malaria/Rheumatoid Hydroxychloroquine (Plaquenil) Stop 11 days before procedure  Thrombolytics  10 days before or after procedures   Over-the-counter (OTC) Products with blood-thinning effects  Product Common names Stop Time  Aspirin > 325  mg Goody Powders, Excedrin, etc. 11 days  Aspirin ? 81 mg  7 days  Fish oil  4 days  Garlic supplements  7 days  Ginkgo biloba  36 hours  Ginseng  24 hours  NSAIDs Ibuprofen, Naprosyn, etc. 3 days  Vitamin E  4 days   ____________________________________________________________________________________________

## 2022-06-16 NOTE — Progress Notes (Signed)
Safety precautions to be maintained throughout the outpatient stay will include: orient to surroundings, keep bed in low position, maintain call bell within reach at all times, provide assistance with transfer out of bed and ambulation.  

## 2022-06-17 ENCOUNTER — Telehealth: Payer: Self-pay

## 2022-06-17 NOTE — Telephone Encounter (Signed)
Post procedure follow up..  Patient states he is doing good 

## 2022-06-20 DIAGNOSIS — E1169 Type 2 diabetes mellitus with other specified complication: Secondary | ICD-10-CM | POA: Diagnosis not present

## 2022-06-20 DIAGNOSIS — I10 Essential (primary) hypertension: Secondary | ICD-10-CM | POA: Diagnosis not present

## 2022-06-20 DIAGNOSIS — E2749 Other adrenocortical insufficiency: Secondary | ICD-10-CM | POA: Diagnosis not present

## 2022-06-20 DIAGNOSIS — D352 Benign neoplasm of pituitary gland: Secondary | ICD-10-CM | POA: Diagnosis not present

## 2022-06-20 DIAGNOSIS — E221 Hyperprolactinemia: Secondary | ICD-10-CM | POA: Diagnosis not present

## 2022-06-30 DIAGNOSIS — D352 Benign neoplasm of pituitary gland: Secondary | ICD-10-CM | POA: Diagnosis not present

## 2022-06-30 DIAGNOSIS — E221 Hyperprolactinemia: Secondary | ICD-10-CM | POA: Diagnosis not present

## 2022-06-30 DIAGNOSIS — E2749 Other adrenocortical insufficiency: Secondary | ICD-10-CM | POA: Diagnosis not present

## 2022-07-05 ENCOUNTER — Telehealth: Payer: No Typology Code available for payment source | Admitting: Pain Medicine

## 2022-07-18 ENCOUNTER — Encounter (HOSPITAL_COMMUNITY): Payer: Self-pay

## 2022-07-18 ENCOUNTER — Emergency Department (HOSPITAL_COMMUNITY)
Admission: EM | Admit: 2022-07-18 | Discharge: 2022-07-19 | Discharge status: Against Medical Advice | Payer: No Typology Code available for payment source | Attending: Student | Admitting: Student

## 2022-07-18 ENCOUNTER — Other Ambulatory Visit: Payer: Self-pay

## 2022-07-18 ENCOUNTER — Encounter (HOSPITAL_COMMUNITY): Payer: Self-pay | Admitting: Emergency Medicine

## 2022-07-18 ENCOUNTER — Ambulatory Visit (HOSPITAL_COMMUNITY)
Admission: EM | Admit: 2022-07-18 | Discharge: 2022-07-18 | Disposition: A | Discharge status: Home / Self Care | Payer: No Typology Code available for payment source

## 2022-07-18 DIAGNOSIS — Z79899 Other long term (current) drug therapy: Secondary | ICD-10-CM | POA: Diagnosis not present

## 2022-07-18 DIAGNOSIS — Z7901 Long term (current) use of anticoagulants: Secondary | ICD-10-CM | POA: Insufficient documentation

## 2022-07-18 DIAGNOSIS — R519 Headache, unspecified: Secondary | ICD-10-CM | POA: Diagnosis present

## 2022-07-18 DIAGNOSIS — D352 Benign neoplasm of pituitary gland: Secondary | ICD-10-CM | POA: Diagnosis not present

## 2022-07-18 DIAGNOSIS — I10 Essential (primary) hypertension: Secondary | ICD-10-CM | POA: Insufficient documentation

## 2022-07-18 DIAGNOSIS — Z5321 Procedure and treatment not carried out due to patient leaving prior to being seen by health care provider: Secondary | ICD-10-CM | POA: Insufficient documentation

## 2022-07-18 DIAGNOSIS — I160 Hypertensive urgency: Secondary | ICD-10-CM | POA: Diagnosis not present

## 2022-07-18 DIAGNOSIS — I499 Cardiac arrhythmia, unspecified: Secondary | ICD-10-CM

## 2022-07-18 LAB — COMPREHENSIVE METABOLIC PANEL
ALT: 20 U/L (ref 0–44)
AST: 20 U/L (ref 15–41)
Albumin: 3.7 g/dL (ref 3.5–5.0)
Alkaline Phosphatase: 55 U/L (ref 38–126)
Anion gap: 11 (ref 5–15)
BUN: 6 mg/dL — ABNORMAL LOW (ref 8–23)
CO2: 26 mmol/L (ref 22–32)
Calcium: 9 mg/dL (ref 8.9–10.3)
Chloride: 103 mmol/L (ref 98–111)
Creatinine, Ser: 1.21 mg/dL (ref 0.61–1.24)
GFR, Estimated: >60 mL/min (ref >60)
Glucose, Bld: 101 mg/dL — ABNORMAL HIGH (ref 70–99)
Potassium: 3.4 mmol/L — ABNORMAL LOW (ref 3.5–5.1)
Sodium: 140 mmol/L (ref 135–145)
Total Bilirubin: 1 mg/dL (ref 0.3–1.2)
Total Protein: 7.1 g/dL (ref 6.5–8.1)

## 2022-07-18 LAB — CBC WITH DIFFERENTIAL/PLATELET
Abs Immature Granulocytes: 0.01 10*3/uL (ref 0.00–0.07)
Basophils Absolute: 0 10*3/uL (ref 0.0–0.1)
Basophils Relative: 0 %
Eosinophils Absolute: 0.1 10*3/uL (ref 0.0–0.5)
Eosinophils Relative: 2 %
HCT: 47.1 % (ref 39.0–52.0)
Hemoglobin: 15.2 g/dL (ref 13.0–17.0)
Immature Granulocytes: 0 %
Lymphocytes Relative: 28 %
Lymphs Abs: 1.8 10*3/uL (ref 0.7–4.0)
MCH: 28.7 pg (ref 26.0–34.0)
MCHC: 32.3 g/dL (ref 30.0–36.0)
MCV: 88.9 fL (ref 80.0–100.0)
Monocytes Absolute: 0.4 10*3/uL (ref 0.1–1.0)
Monocytes Relative: 7 %
Neutro Abs: 3.9 10*3/uL (ref 1.7–7.7)
Neutrophils Relative %: 63 %
Platelets: 193 10*3/uL (ref 150–400)
RBC: 5.3 MIL/uL (ref 4.22–5.81)
RDW: 14.5 % (ref 11.5–15.5)
WBC: 6.3 10*3/uL (ref 4.0–10.5)
nRBC: 0 % (ref 0.0–0.2)

## 2022-07-18 LAB — PROTIME-INR
INR: 1.8 — ABNORMAL HIGH (ref 0.8–1.2)
Prothrombin Time: 20.8 seconds — ABNORMAL HIGH (ref 11.4–15.2)

## 2022-07-18 LAB — TROPONIN I (HIGH SENSITIVITY): Troponin I (High Sensitivity): 6 ng/L (ref <18)

## 2022-07-18 MED ORDER — METOCLOPRAMIDE HCL 10 MG PO TABS
10.0000 mg | ORAL_TABLET | Freq: Once | ORAL | Status: AC
  Administered 2022-07-18: 10 mg via ORAL
  Filled 2022-07-18: qty 1

## 2022-07-18 MED ORDER — ACETAMINOPHEN 325 MG PO TABS
650.0000 mg | ORAL_TABLET | Freq: Once | ORAL | Status: AC
  Administered 2022-07-18: 650 mg via ORAL
  Filled 2022-07-18: qty 2

## 2022-07-18 NOTE — ED Notes (Signed)
Patient is being discharged from the Urgent Care and sent to the Emergency Department via POV . Per Levada Dy, [place], patient is in need of higher level of care due to need of further evaluation. Patient is aware and verbalizes understanding of plan of care.  Vitals:   07/18/22 2104  BP: (!) 211/102  Pulse: 90  Resp: 20  Temp: 99.2 F (37.3 C)  SpO2: 94%

## 2022-07-18 NOTE — ED Triage Notes (Signed)
Pt reports high blood pressure since 11am.  Pt reports "one of the worst headaches I've had" described as pressure, throbbing mainly around the eyes.  No changes in vision

## 2022-07-18 NOTE — ED Provider Triage Note (Signed)
Emergency Medicine Provider Triage Evaluation Note  [name] Whitaker , a 72 y.o. male  was evaluated in triage.  Pt complains of severe headache behind the eyes that started around 10:00 this morning, uncontrolled blood pressure at this time.  Distant he has been out of his propranolol for the last few days but has been taking his candesartan and chlorthalidone.  No blurry or double vision, no nausea.  Does also have history of migrainers.  History of pituitary adenoma.  Anticoagulated on Xarelto. Review of Systems  Positive: As above Negative: As above, chest pain  Physical Exam  BP (!) 191/113 (BP Location: Right Arm)   Pulse 78   Temp 97.8 F (36.6 C) (Oral)   Resp 18   Ht 5' 10.5" (1.791 m)   Wt 113.4 kg   SpO2 97%   BMI 35.36 kg/m  Gen:   Awake, no distress   Resp:  Normal effort  MSK:   Moves extremities without difficulty  Other:  No focal deficit on neurologic exam.  RRR no M/R/G.  Lungs CTAB.  Medical Decision Making  Medically screening exam initiated at 10:30 PM.  Appropriate orders placed.  Bartley Aase was informed that the remainder of the evaluation will be completed by another provider, this initial triage assessment does not replace that evaluation, and the importance of remaining in the ED until their evaluation is complete.  Hypertensive emergency workup initiated.  This chart was dictated using voice recognition software, Dragon. Despite the best efforts of this provider to proofread and correct errors, errors may still occur which can change documentation meaning.    Emeline Darling, PA-C 07/18/22 2243

## 2022-07-18 NOTE — Discharge Instructions (Signed)
Please go directly to the [place] emergency department

## 2022-07-18 NOTE — ED Provider Notes (Signed)
[facility]    CSN: [id number] Arrival date & time: 07/18/22  1929      History   Chief Complaint Chief Complaint  Patient presents with   Headache   Hypertension    HPI [name] Whitaker is a 72 y.o. male.  He reports that his blood pressure has been elevated lately but today it has been much higher than any other day.  He felt okay when he woke up this morning and then later today developed the worst headache of his life.  The headache is centered around his periorbital area bilaterally.  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  It was last scanned last February and was stable at that time.  He reports he is due to have it rechecked soon.  He is uncertain about the cause of his increased blood pressure.  He did report it to his healthcare provider at the [facility] on February 20 and received an response on answer essentially stating his provider did not know why his blood pressure was higher lately.  Patient does take hydrocortisone for his pituitary tumor and the dose was increased last October but there have been no changes to the dosing recently.  He denies feeling ill recently, denies nasal congestion, nasal drainage, fever, chills.  He does report worsening allergy symptoms for about a week that happens every year around this time.  Associated with allergies, he gets bags underneath both eyes.  He tried taking a gabapentin for the headache today but did not relieve it at all.  He does have a history of migraines and this headache feels nothing like a migraine to him.  He denies chest pain or shortness of breath.  He denies feeling any heart palpitations.  He denies any head trauma or head injury or fall.    Headache Hypertension Associated symptoms include headaches.    Past Medical History:  Diagnosis Date   Abnormal ECG 09/27/2013   Chest pain 09/27/2013   Dilated aortic root ([place]) 09/27/2013   DVT, lower extremity ([place]) 10/27/2011   Dyspnea 06/27/2014   2/5 /2016   Walked RA x 3 laps @ 185 ft each stopped due to  End of study, slow pace min sob  - PFTs 08/01/14  FEV1  2.89 (90%) ratio 81 and nl dlco     GERD (gastroesophageal reflux disease)    Gout    History of DVT (deep vein thrombosis) 04/14/2019   History of pulmonary embolism 04/14/2019   HTN (hypertension) 10/25/2011   Hypercholesteremia    Migraine    1-2x/wk   OSA (obstructive sleep apnea)    on CPAP    Pre-diabetes     Patient Active Problem List   Diagnosis Date Noted   Uncontrolled hypertension 06/02/2022   CKD (chronic kidney disease) stage 3, GFR 30-59 ml/min ([facility]) 05/23/2022   Acute bronchitis, unspecified 05/23/2022   Lack of physical exercise 05/23/2022   Psychological and behavioral factors associated with disorders or diseases classified elsewhere 05/23/2022   Type 2 diabetes mellitus without complications ([place]) [id number]   Urinary tract infection, site not specified 05/23/2022   Encounter for observation for other suspected diseases and conditions ruled out 05/23/2022   Encounter for screening for eye and ear disorders 05/23/2022   Encounter for therapeutic drug level monitoring 05/23/2022   Statin myopathy 05/11/2022   Grade 1 Anterolisthesis (11m) of lumbar spine of L4/L5 06/15/2021   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   Migraine with aura, not intractable, without status  migrainosus 06/01/2021   Encounter for immunization 06/01/2021   Primary osteoarthritis, left ankle and foot 06/01/2021   Osteoarthritis of left foot 06/01/2021   Body mass index (BMI) 35.0-35.9, adult 03/02/2021   Seasonal allergic rhinitis 11/16/2020   Encounter for other administrative examinations 11/16/2020   Pituitary tumor 08/31/2020   Pituitary adenoma ([place]) 08/22/2020   Benign prostatic hyperplasia 07/20/2020   Steatosis of liver 07/20/2020   Atherosclerotic heart disease of native coronary artery without angina pectoris 05/28/2020   Chronic kidney disease with active medical  management without dialysis, stage 3 (moderate) ([place]) 05/28/2020   Migraine 05/28/2020   Other seasonal allergic rhinitis 05/28/2020   Diabetes mellitus ([place]) 05/28/2020   Long term (current) use of anticoagulants 05/28/2020   Personal history of pulmonary embolism 05/28/2020   Essential (primary) hypertension 05/28/2020   Hypercholesterolemia 05/28/2020   Acute postoperative pain 05/28/2020   Other spondylosis, sacral and sacrococcygeal region 12/17/2019   Osteoarthritis of hips (Bilateral) 12/09/2019   Somatic dysfunction of sacroiliac joints (Bilateral) (R>L) 12/09/2019   Arthritis of hip (Right) 08/26/2019   Osteoarthritis of hip (Right) 04/30/2019   Abnormal MRI, lumbar spine (2014) 04/15/2019   Lumbar facet hypertrophy (Multilevel)  04/15/2019   Lumbar foraminal stenosis (L3-4, L4-5, L5-S1) 04/15/2019   Lumbar facet joint syndrome (Bilateral) (R>L) 04/15/2019   DDD (degenerative disc disease), lumbosacral 04/15/2019   Chronic hip pain (Bilateral) 04/15/2019   Chronic low back pain (1ry area of Pain) (Bilateral) (R>L) w/o sciatica 04/15/2019   Chronic sacroiliac joint pain (Bilateral) (R>L) 04/15/2019   Other intervertebral disc degeneration, lumbar region 04/15/2019   Chronic anticoagulation (Xarelto) 04/15/2019   History of DVT (deep vein thrombosis) 04/14/2019   History of pulmonary embolism 04/14/2019   Chronic pain syndrome 04/14/2019   Pharmacologic therapy 04/14/2019   Disorder of skeletal system 04/14/2019   Problems influencing health status 04/14/2019   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 07/10/2018   Spondylosis of lumbar region without myelopathy or radiculopathy 07/10/2018   Intractable migraine with aura without status migrainosus 06/29/2016   Obstructive sleep apnea of adult 06/29/2016   Spinal stenosis of lumbar region 08/24/2015   Bilateral calf pain 08/02/2014   Dyspnea, unspecified 06/27/2014   Chest pain 09/27/2013   Abnormal ECG  09/27/2013   Dilated aortic root ([place]) 09/27/2013   Pure hypercholesterolemia 09/27/2013   Lumbosacral spondylosis without myelopathy 08/27/2013   OSA on CPAP 10/27/2011   Hyperlipidemia, unspecified 10/25/2011   Gout 10/25/2011   Other pulmonary embolism and infarction 05/23/2004    Past Surgical History:  Procedure Laterality Date   APPENDECTOMY     BACK SURGERY     CARDIAC CATHETERIZATION     CATARACT EXTRACTION W/PHACO Right 10/20/2020   Procedure: CATARACT EXTRACTION PHACO AND INTRAOCULAR LENS PLACEMENT ([place]) RIGHT DIABETIC;  Surgeon: Birder Robson, MD;  Location: [facility];  Service: Ophthalmology;  Laterality: Right;  6.11 0:45.8   CATARACT EXTRACTION W/PHACO Left 11/03/2020   Procedure: CATARACT EXTRACTION PHACO AND INTRAOCULAR LENS PLACEMENT ([place]) LEFT DIABETIC;  Surgeon: Birder Robson, MD;  Location: [facility];  Service: Ophthalmology;  Laterality: Left;  Diabetic - oral meds 3.55 00:26.2   NASAL SEPTUM SURGERY     TESTICLE SURGERY     TONSILLECTOMY         Home Medications    Prior to Admission medications   Medication Sig Start Date End Date Taking? Authorizing Provider  acetaminophen (TYLENOL) 325 MG tablet Take 650 mg by mouth as needed.    [provider]  allopurinol (ZYLOPRIM) 300 MG tablet Take 300 mg by mouth daily.    [provider]  ascorbic acid (VITAMIN C) 500 MG tablet Take 1 tablet by mouth daily. 01/16/20   [provider]  candesartan (ATACAND) 32 MG tablet Take 32 mg by mouth daily.    [provider]  chlorthalidone (HYGROTON) 50 MG tablet Take 25 mg by mouth daily.    [provider]  Cholecalciferol 25 MCG (1000 UT) tablet Take by mouth. 06/27/19   [provider]  cyclobenzaprine (FLEXERIL) 10 MG tablet Take 10 mg by mouth as needed.     [provider]  Dulaglutide (TRULICITY [place]) Inject 1 Dose into the skin once a week.    [provider]   EPINEPHrine 0.3 mg/0.3 mL IJ SOAJ injection Inject 0.3 mg into the muscle as needed.    [provider]  famotidine (PEPCID) 20 MG tablet One at bedtime 06/27/14   Tanda Rockers, MD  finasteride (PROSCAR) 5 MG tablet Take 5 mg by mouth daily.    [provider]  fluticasone (FLONASE) 50 MCG/ACT nasal spray Place into both nostrils daily.    [provider]  gabapentin (NEURONTIN) 400 MG capsule Take 600 mg by mouth 2 (two) times daily. Increased to '600mg'$     [provider]  hydrocortisone (CORTEF) 5 MG tablet Take 5 mg by mouth 4 (four) times daily.    [provider]  ketoconazole (NIZORAL) 2 % cream Apply 1 application topically daily as needed for irritation (feet).  10/07/13   [provider]  potassium chloride SA (K-DUR,KLOR-CON) 20 MEQ tablet Take 1 tab daily 09/06/13   [provider]  propranolol (INDERAL) 80 MG tablet Take 120 mg by mouth daily. 01/17/22   [provider]  propranolol (INNOPRAN XL) 80 MG 24 hr capsule TAKE ONE CAPSULE BY MOUTH EVERY DAY FOR MIGRAINE PROPHYLAXIS. 07/09/20   [provider]  rosuvastatin (CRESTOR) 20 MG tablet Take 1 tablet (20 mg total) by mouth daily. 02/26/21   Jerline Pain, MD  topiramate (TOPAMAX) 50 MG tablet TAKE ONE TABLET BY MOUTH TWO TIMES A DAY START AFTER COMPLETING 25 MG TABLETS 08/04/20   [provider]    Family History Family History  Problem Relation Age of Onset   Hypertension Mother    Hypertension Father    Clotting disorder Brother     Social History Social History   Tobacco Use   Smoking status: Former    Types: Pipe    Quit date: 05/24/1983    Years since quitting: 39.1   Smokeless tobacco: Never   Tobacco comments:    smoked a pipe for 4 yrs- quit 1985  Vaping Use   Vaping Use: Never used  Substance Use Topics   Alcohol use: No    Alcohol/week: 0.0 standard drinks of alcohol   Drug use: No     Allergies   Penicillins,  Amlodipine, Amlodipine besylate, Atorvastatin, Erenumab-aooe, Lisinopril, Penicillin g, and Simvastatin   Review of Systems Review of Systems  Neurological:  Positive for headaches.     Physical Exam Triage Vital Signs ED Triage Vitals  Enc Vitals Group     BP 07/18/22 2104 (!) 211/102     Pulse Rate 07/18/22 2104 90     Resp 07/18/22 2104 20     Temp 07/18/22 2104 99.2 F (37.3 C)     Temp Source 07/18/22 2104 Oral     SpO2 07/18/22  2104 94 %     Weight --      Height --      Head Circumference --      Peak Flow --      Pain Score 07/18/22 2105 8     Pain Loc --      Pain Edu? --      Excl. in [place]? --    No data found.  Updated Vital Signs BP (!) 211/102 (BP Location: Left Arm)   Pulse 90   Temp 99.2 F (37.3 C) (Oral)   Resp 20   SpO2 94%   Visual Acuity Right Eye Distance:   Left Eye Distance:   Bilateral Distance:    Right Eye Near:   Left Eye Near:    Bilateral Near:     Physical Exam Constitutional:      Appearance: He is well-developed.     Comments: Appears uncomfortable  HENT:     Head: Normocephalic and atraumatic. No right periorbital erythema or left periorbital erythema.      Nose:     Right Sinus: No maxillary sinus tenderness or frontal sinus tenderness.     Left Sinus: No maxillary sinus tenderness or frontal sinus tenderness.  Neck:     Vascular: No carotid bruit.  Cardiovascular:     Rate and Rhythm: Normal rate. Rhythm irregular.     Heart sounds: No murmur heard.    Comments: On occasion, his heart beat appears to pause and then restarts a regular rhythm. Pulmonary:     Effort: Pulmonary effort is normal.     Breath sounds: Normal breath sounds.  Neurological:     Mental Status: He is alert.      UC Treatments / Results  Labs (all labs ordered are listed, but only abnormal results are displayed) Labs Reviewed - No data to display  EKG   Radiology No results found.  Procedures Procedures (including critical care  time)  Medications Ordered in UC Medications - No data to display  Initial Impression / Assessment and Plan / UC Course  I have reviewed the triage vital signs and the nursing notes.  Pertinent labs & imaging results that were available during my care of the patient were reviewed by me and considered in my medical decision making (see chart for details).    BP on recheck is 179/114.   I am concerned about pt's severe headache with significantly elevated BP in the context of a known pituitary tumor. He currently has an irregular heart rhythm. I advised him to go to [facility] ER for further care.   Final Clinical Impressions(s) / UC Diagnoses   Final diagnoses:  Bad headache  Hypertensive urgency  Irregular heart beat  Pituitary adenoma [facility])     Discharge Instructions      Please go directly to the [facility] emergency department   ED Prescriptions   None    PDMP not reviewed this encounter.   Carvel Getting, NP 07/18/22 2157

## 2022-07-18 NOTE — ED Triage Notes (Signed)
Pt states had a headache this am and his b/p was 171/101. Denies blurred vision or confusion. States took his b/p meds this am.

## 2022-07-19 ENCOUNTER — Emergency Department (HOSPITAL_COMMUNITY): Payer: No Typology Code available for payment source

## 2022-07-19 NOTE — ED Notes (Signed)
PT Dropped of labels and left the building

## 2022-07-22 DIAGNOSIS — H26491 Other secondary cataract, right eye: Secondary | ICD-10-CM | POA: Diagnosis not present

## 2022-07-22 DIAGNOSIS — Z961 Presence of intraocular lens: Secondary | ICD-10-CM | POA: Diagnosis not present

## 2022-07-22 DIAGNOSIS — D352 Benign neoplasm of pituitary gland: Secondary | ICD-10-CM | POA: Diagnosis not present

## 2022-07-27 NOTE — Progress Notes (Unsigned)
Patient: [name] Whitaker  Service Category: E/M  Provider: Gaspar Cola, MD  DOB: [birth year]  DOS: 07/28/2022  Location: Office  MRN: [id number]  Setting: Ambulatory outpatient  Referring Provider: [facility] Her[facility] M*  Type: Established Patient  Specialty: Interventional Pain Management  PCP: [facility] [place]  Location: Remote location  Delivery: TeleHealth     Virtual Encounter - Pain Management PROVIDER NOTE: Information contained herein reflects review and annotations entered in association with encounter. Interpretation of such information and data should be left to medically-trained personnel. Information provided to patient can be located elsewhere in the medical record under "Patient Instructions". Document created using STT-dictation technology, any transcriptional errors that may result from process are unintentional.    Contact & Pharmacy Preferred: [phone] Home: [phone] (home) Mobile: [phone] (mobile) E-mail: [email or web address], [place] [address] Phone: [phone] Fax: [phone]  [id number] [place]Phone: [phone]: [phone] [id number] [place] [address]one: [phone]: [phone] Pre-screening  [name] Whitaker offered "in-person" vs "virtual" encounter. He indicated preferring virtual for this encounter.   Reason COVID-19*  Social distancing based on CDC and AMA recommendations.   I contacted [name] Spanishn 07/28/2022 via telephone.      I clearly identified myself as [name] Whitaker. I verified that I was speaking with the correct person using two identifiers (Name: [name] Whitaker date of birth: 08/28/23/1952  Consent I sought verbal advanced consent from [name] Whitaker virtual visit  interactions. I informed [name] Whitaker possible security and privacy concerns, risks, and limitations associated with providing "not-in-person" medical evaluation and management services. I also informed [name] Whitaker the availability of "in-person" appointments. Finally, I informed him that there would be a charge for the virtual visit and that he could be  personally, fully or partially, financially responsible for it. [name] Whitaker understanding and agreed to proceed.   Historic Elements   [name] Whitaker a [age].o. year old, male patient evaluated today after our last contact on 06/16/2022. [name] Whitaker a past medical history of Abnormal ECG (09/27/2013), Chest pain (09/27/2013), [place]ilated [place] root ([place](09/27/2013), DVT, lower extremity ([place](10/27/2011), Dyspnea (06/27/2014), GERD (gastroesophageal reflux disease), Gout, History of DVT (deep vein thrombosis) (04/14/2019), History of pulmonary embolism (04/14/2019), HTN (hypertension) (10/25/2011), Hypercholesteremia, Migraine, OSA (obstructive sleep apnea), and Pre-diabetes. He also  has a past surgical history that includes Appendectomy; Nasal septum surgery; Tonsillectomy; Testicle surgery; Cardiac catheterization; Back surgery; Cataract extraction w/PHACO (Right, 10/20/2020); and Cataract extraction w/PHACO (Left, 11/03/2020). [name] a current medication list which includes the following prescription(s): acetaminophen, allopurinol, ascorbic acid, candesartan, chlorthalidone, cholecalciferol, cyclobenzaprine, dulaglutide, epinephrine, famotidine, finasteride, fluticasone, gabapentin, hydrocortisone, ketoconazole, potassium chloride sa, propranolol, propranolol, rosuvastatin, and topiramate. He  reports that he quit smoking about 39 years ago. His smoking use included pipe. He has never used smokeless tobacco. He reports that he does not drink alcohol and does not use drugs. [name] Whitaker allergic to penicillins, amlodipine,  amlodipine besylate, atorvastatin, erenumab-aooe, lisinopril, penicillin g, and simvastatin.  BMI: Estimated body mass index is 35.36 kg/m as calculated from the following:   Height as of 07/18/22: 5' 10.5" (1.791 m).   Weight as of 07/18/22: 250 lb (113.4 kg). Last encounter: 05/02/2022. Last procedure: 06/16/2022.  HPI  Today, he is being contacted for a post-procedure assessment.  Post-procedure evaluation   Type: Lumbar Facet, Medial Branch Radiofrequency Ablation (RFA) #3  Laterality: Left (-LT)  Level: L3, L4, L5, and S1 Medial Branch Level(s). These levels will denervate the L4-5 and L5-S1 lumbar facet joints.  Imaging: Fluoroscopy-guided         Anesthesia: Local anesthesia (1-2% Lidocaine) Anxiolysis: IV Versed 2.0 mg Sedation: Moderate Sedation Fentanyl 1 mL (50 mcg) DOS: 06/16/2022  Performed by: Gaspar Cola, MD  Purpose: Therapeutic/Palliative Indications: Low back pain severe enough to impact quality of life or function. Indications: 1. Lumbar facet joint syndrome (Bilateral) (R>L)   2. Spondylosis of lumbar region without myelopathy or radiculopathy   3. Lumbosacral spondylosis without myelopathy   4. Lumbar facet hypertrophy (Multilevel)    5. Grade 1 Anterolisthesis (1m) of lumbar spine of L4/L5   6. DDD (degenerative disc disease), lumbosacral   7. Chronic low back pain (1ry area of Pain) (Bilateral) (R>L) w/o sciatica   8. Chronic anticoagulation (Xarelto)    Pain Score: Pre-procedure: 7 /10 Post-procedure: 0-No pain/10     Effectiveness:  Initial hour after procedure:   ***. Subsequent 4-6 hours post-procedure:   ***. Analgesia past initial 6 hours:   ***. Ongoing improvement:  Analgesic:  *** Function:    ***    ROM:    ***     Pharmacotherapy Assessment   Opioid Analgesic: No opioid analgesics prescribed by our practice. Highest recorded MME/day: 26.67 mg/day MME/day: 0 mg/day   Monitoring: [place] PMP: PDMP reviewed during this encounter.        Pharmacotherapy: No side-effects or adverse reactions reported. Compliance: No problems identified. Effectiveness: Clinically acceptable. Plan: Refer to "POC". UDS: No results found for: "SUMMARY" No results found for: "CBDTHCR", "D8THCCBX", "D9THCCBX"   Laboratory Chemistry Profile   Renal Lab Results  Component Value Date   BUN 6 (L) 07/18/2022   CREATININE 1.21 07/18/2022   BCR 9 (L) 02/25/2021   GFRAA 62 04/15/2019   GFRNONAA >60 07/18/2022    Hepatic Lab Results  Component Value Date   AST 20 07/18/2022   ALT 20 07/18/2022   ALBUMIN 3.7 07/18/2022   ALKPHOS 55 07/18/2022   LIPASE 19 03/15/2007    Electrolytes Lab Results  Component Value Date   NA 140 07/18/2022   K 3.4 (L) 07/18/2022   CL 103 07/18/2022   CALCIUM 9.0 07/18/2022   MG 2.2 04/15/2019    Bone Lab Results  Component Value Date   25OHVITD1 43 04/15/2019   25OHVITD2 <1.0 04/15/2019   25OHVITD3 43 04/15/2019    Inflammation (CRP: Acute Phase) (ESR: Chronic Phase) Lab Results  Component Value Date   CRP 2 04/15/2019   ESRSEDRATE 6 04/15/2019         Note: Above Lab results reviewed.  Imaging  CT HEAD WO CONTRAST (5MM) CLINICAL DATA:  Hypertensive with headache.  EXAM: CT HEAD WITHOUT CONTRAST  TECHNIQUE: Contiguous axial images were obtained from the base of the skull through the vertex without intravenous contrast.  RADIATION DOSE REDUCTION: This exam was performed according to the departmental dose-optimization program which includes automated exposure control, adjustment of the mA and/or kV according to patient size and/or use of iterative reconstruction technique.  COMPARISON:  January 30, 2019  FINDINGS: Brain: There is mild cerebral atrophy with widening of the extra-axial spaces and ventricular dilatation. There are areas of decreased attenuation within the white matter tracts of the supratentorial brain, consistent with  microvascular disease changes.  Vascular: No  hyperdense vessel or unexpected calcification.  Skull: Normal. Negative for fracture or focal lesion.  Sinuses/Orbits: No acute finding.  Other: None.  IMPRESSION: 1. No acute intracranial abnormality. 2. Generalized cerebral atrophy and microvascular disease changes of the supratentorial brain.  Electronically Signed   By: Virgina Norfolk M.D.   On: 07/19/2022 00:54  Assessment  The primary encounter diagnosis was Chronic low back pain (1ry area of Pain) (Bilateral) (R>L) w/o sciatica. Diagnoses of Lumbar facet joint syndrome (Bilateral) (R>L) and Acute postoperative pain were also pertinent to this visit.  Plan of Care  Problem-specific:  No problem-specific Assessment & Plan notes found for this encounter.  [name] Whitaker has a current medication list which includes the following long-term medication(s): allopurinol, candesartan, chlorthalidone, famotidine, potassium chloride sa, propranolol, rosuvastatin, and topiramate.  Pharmacotherapy (Medications Ordered): No orders of the defined types were placed in this encounter.  Orders:  No orders of the defined types were placed in this encounter.  Follow-up plan:   No follow-ups on file.      Interventional Therapies  Risk factors  Considerations:   NOTE: Xarelto Anticoagulation (Stop: 3 days  Restart: 6 hours) HTN   Planned  Pending:   Therapeutic left lumbar facet RFA #3    Under consideration:   Possible right opturator + femoral NB  Possible right opturator + femoral nerve RFA    Completed:   Therapeutic right IA hip joint inj. x2 (04/30/2019) (100/100/50/90-100) Diagnostic bilateral SI joint Blk x2 (03/10/2020) (50/50/100 x 5 wks/0)  Diagnostic bilateral lumbar facet MBB x1 (05/07/2020) (100/100/100 x1 week/>75)  Therapeutic left lumbar facet RFA x2 (06/29/2021) (100/100/100/100)  Therapeutic right lumbar facet RFA x3 (05/24/2022) (100/100/80/80)    Palliative options:   Palliative right IA hip  joint inj.  Palliative bilateral SI joint Blk  Therapeutic/palliative lumbar facet RFA         Recent Visits Date Type Provider Dept  06/16/22 Procedure visit Milinda Pointer, MD [facility]  06/02/22 Procedure visit Milinda Pointer, MD [facility]  05/24/22 Procedure visit Milinda Pointer, MD [facility]  05/02/22 Office Visit Milinda Pointer, MD [facility]  Showing recent visits within past 90 days and meeting all other requirements Future Appointments Date Type Provider Dept  07/28/22 Appointment Milinda Pointer, MD [facility]  Showing future appointments within next 90 days and meeting all other requirements  I discussed the assessment and treatment plan with the patient. The patient was provided an opportunity to ask questions and all were answered. The patient agreed with the plan and demonstrated an understanding of the instructions.  Patient advised to call back or seek an in-person evaluation if the symptoms or condition worsens.  Duration of encounter: *** minutes.  Note by: Gaspar Cola, MD Date: 07/28/2022; Time: 7:30 AM

## 2022-07-28 ENCOUNTER — Ambulatory Visit: Payer: No Typology Code available for payment source | Attending: Pain Medicine | Admitting: Pain Medicine

## 2022-07-28 DIAGNOSIS — M47816 Spondylosis without myelopathy or radiculopathy, lumbar region: Secondary | ICD-10-CM

## 2022-07-28 DIAGNOSIS — M545 Low back pain, unspecified: Secondary | ICD-10-CM

## 2022-07-28 DIAGNOSIS — G8929 Other chronic pain: Secondary | ICD-10-CM | POA: Diagnosis not present

## 2022-07-28 DIAGNOSIS — G8918 Other acute postprocedural pain: Secondary | ICD-10-CM

## 2022-08-05 ENCOUNTER — Other Ambulatory Visit: Payer: Self-pay | Admitting: Neurosurgery

## 2022-08-05 DIAGNOSIS — D497 Neoplasm of unspecified behavior of endocrine glands and other parts of nervous system: Secondary | ICD-10-CM

## 2022-08-24 DIAGNOSIS — E1169 Type 2 diabetes mellitus with other specified complication: Secondary | ICD-10-CM | POA: Diagnosis not present

## 2022-08-24 DIAGNOSIS — I10 Essential (primary) hypertension: Secondary | ICD-10-CM | POA: Diagnosis not present

## 2022-08-25 ENCOUNTER — Ambulatory Visit: Admission: RE | Admit: 2022-08-25 | Payer: No Typology Code available for payment source | Source: Ambulatory Visit

## 2022-11-17 DIAGNOSIS — Z719 Counseling, unspecified: Secondary | ICD-10-CM | POA: Insufficient documentation

## 2022-11-17 DIAGNOSIS — H52223 Regular astigmatism, bilateral: Secondary | ICD-10-CM | POA: Insufficient documentation

## 2022-11-17 DIAGNOSIS — D330 Benign neoplasm of brain, supratentorial: Secondary | ICD-10-CM | POA: Insufficient documentation

## 2022-11-17 NOTE — Progress Notes (Signed)
PROVIDER NOTE: Information contained herein reflects review and annotations entered in association with encounter. Interpretation of such information and data should be left to medically-trained personnel. Information provided to patient can be located elsewhere in the medical record under "Patient Instructions". Document created using STT-dictation technology, any transcriptional errors that may result from process are unintentional.    Patient: [name] Whitaker  Service Category: E/M  Provider: Oswaldo Done, MD  DOB: Aug 25, 1950  DOS: 11/21/2022  Referring Provider: Center, [facility]  MRN: [id number]  Specialty: Interventional Pain Management  PCP: [facility], [facility]  Type: Established Patient  Setting: Ambulatory outpatient    Location: Office  Delivery: Face-to-face     HPI  [name] Whitaker, a 72 y.o. year old male, is here today because of his Chronic bilateral low back pain without sciatica [M54.50, G89.29]. Mr. Olmo's primary complain today is Back Pain (right) and Hip Pain (right)  Pertinent problems: Mr. 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, L4-5, L5-S1); Lumbar facet joint syndrome (Bilateral) (R>L); DDD (degenerative disc disease), lumbosacral; Chronic hip pain (Bilateral); Chronic low back pain (1ry area of Pain) (Bilateral) (R>L) w/o sciatica; Chronic sacroiliac joint pain (Bilateral) (R>L); Other intervertebral disc degeneration, lumbar region; Osteoarthritis of hip (Right); Arthritis of hip (Right); Osteoarthritis of hips (Bilateral); Somatic dysfunction of sacroiliac joints (Bilateral) (R>L); Other spondylosis, sacral and sacrococcygeal region; Acute  postoperative pain; Migraine with aura, not intractable, without status migrainosus; Primary osteoarthritis, left ankle and foot; Osteoarthritis of left foot; Lumbosacral spondylosis without myelopathy; Spinal stenosis of lumbar region; Grade 1 Anterolisthesis (2mm) of lumbar spine of L4/L5; and Statin myopathy on their pertinent problem list. Pain Assessment: Severity of Chronic pain is reported as a 8 /10. Location: Back (and right hip) Right/denies. Onset: More than a month ago. Quality: Other (Comment) (Back-ache; Hip "bone on bone pain"). Timing: Constant. Modifying factor(s): injections. Vitals:  height is 5\' 11"  (1.803 m) and weight is 260 lb (117.9 kg). His temporal temperature is 97.5 F (36.4 C) (abnormal). His blood pressure is 109/64 and his pulse is 66. His respiration is 16 and oxygen saturation is 98%.  BMI: Estimated body mass index is 36.26 kg/m as calculated from the following:   Height as of this encounter: 5\' 11"  (1.803 m).   Weight as of this encounter: 260 lb (117.9 kg). Last encounter: 07/28/2022. Last procedure: 06/16/2022.  (Left lumbar facet RFA # 3).  Reason for encounter: evaluation of worsening, or previously known (established) problem.  Recurrent right low back pain.  Right-sided lumbar facet RFA # 3 (L4-5, L5-S1) done on 05/24/2022 (6 months).  Last lumbar MRI done at [facility] (05/30/2018).  2 mm anterolisthesis of L4 over L5 with moderate central spinal stenosis with potential for neural compression in either the lateral recess or neural foramina.  Today the patient indicates the pain to be localized to the right lower back.  No lower extremity pain, numbness or weakness.  No pain radiating into the buttocks area.  The pain is localized to the right PSIS.  Sacroiliac Joint Exam Inspection: No masses, redness, or swelling. Alignment: Symmetrical         Pain Pattern: Articular.   Sacroiliac Provocative Tests Item Cluster:   Positive Sacral Sulcus Palpation  (Standing or sitting. PSIS pressure.) (  Sensitive: 95% ):  Right: (Y) Left: (N)   Positive SI Distraction test (anterior) (Supine. Downward pressure over bilateral ASIS) (Reliability: 65.4%  Sensitivity: 25.75%  Specificity: 88.2%  (PPV): 60%  (NPV): 81%):  Right: (Y) Left: (N)   Positive Thigh-thrust test (Supine. LE flexed on tested side.) (Reliability: 74%  Sensitivity: 88%  Specificity: 75%  Positive predictive value (PPV): 58%  Negative predictive value (NPV): 92%):  Right: (Y) Left: (N)   Positive Gaenslen's maneuver (Supine. Tested side held hyperextended while flexing opposite.) (Sensitivity: 50-53%  Specificity: %  Positive predictive value (PPV): 47-50%  Negative predictive value (NPV): 76-77%):  Right: (Y) Left: (N)   Positive FABER (Patrick's) maneuver (FABER: Supine. Leg Flexion, ABduction and External Rotation) (Sensitivity: 69%  Specificity: 100%  (PPV): 100%  (NPV): 9%):  Right: (Y) Left: (N)   Positive SI Compression test (Lateral decubitus. Symptomatic side up.) (Reliability: 84%  Sensitivity: 100%  Specificity: 89%  (PPV): 52%  (NPV): 82%):  Right: (Y) Left: (N)  The patient is currently on Xarelto which we will stop for 3 days prior to a repeat right-sided sacroiliac joint injection.  The plan was shared with the patient who understood and accepted.  Pharmacotherapy Assessment  Analgesic: No opioid analgesics prescribed by our practice. Highest recorded MME/day: 26.67 mg/day MME/day: 0 mg/day   Monitoring: [place] PMP: PDMP reviewed during this encounter.       Pharmacotherapy: No side-effects or adverse reactions reported. Compliance: No problems identified. Effectiveness: Clinically acceptable.  Asher Muir, RN  11/21/2022  9:39 AM  Signed Patient is aware to stop blood thinner 3 days prior to procedure.   Nonah Mattes, RN  11/21/2022  9:15 AM  Sign when Signing Visit Safety precautions to be maintained throughout the outpatient stay  will include: orient to surroundings, keep bed in low position, maintain call bell within reach at all times, provide assistance with transfer out of bed and ambulation.     No results found for: "CBDTHCR" No results found for: "D8THCCBX" No results found for: "D9THCCBX"  UDS:  No results found for: "SUMMARY"    ROS  Constitutional: Denies any fever or chills Gastrointestinal: No reported hemesis, hematochezia, vomiting, or acute GI distress Musculoskeletal: Denies any acute onset joint swelling, redness, loss of ROM, or weakness Neurological: No reported episodes of acute onset apraxia, aphasia, dysarthria, agnosia, amnesia, paralysis, loss of coordination, or loss of consciousness  Medication Review  Cholecalciferol, Dulaglutide, EPINEPHrine, acetaminophen, allopurinol, ascorbic acid, candesartan, chlorthalidone, cyclobenzaprine, famotidine, finasteride, fluticasone, gabapentin, hydrocortisone, ketoconazole, potassium chloride SA, propranolol, rosuvastatin, spironolactone, and topiramate  History Review  Allergy: Mr. Goings is allergic to penicillins, amlodipine, amlodipine besylate, atorvastatin, erenumab-aooe, lisinopril, penicillin g, and simvastatin. Drug: Mr. Gehm  reports no history of drug use. Alcohol:  reports no history of alcohol use. Tobacco:  reports that he quit smoking about 39 years ago. His smoking use included pipe. He has never used smokeless tobacco. Social: Mr. Amory  reports that he quit smoking about 39 years ago. His smoking use included pipe. He has never used smokeless tobacco. He reports that he does not drink alcohol and does not use drugs. Medical:  has a past medical history of Abnormal ECG (09/27/2013), Chest pain (09/27/2013), Dilated aortic root (HCC) (09/27/2013), DVT, lower extremity (HCC) (10/27/2011), Dyspnea (06/27/2014), GERD (gastroesophageal reflux disease), Gout, History of DVT (deep vein thrombosis) (04/14/2019), History of pulmonary embolism  (04/14/2019), HTN (hypertension) (10/25/2011), Hypercholesteremia, Migraine, OSA (obstructive sleep apnea), and Pre-diabetes. Surgical: Mr. Schomberg  has a past  surgical history that includes Appendectomy; Nasal septum surgery; Tonsillectomy; Testicle surgery; Cardiac catheterization; Back surgery; Cataract extraction w/PHACO (Right, 10/20/2020); and Cataract extraction w/PHACO (Left, 11/03/2020). Family: family history includes Clotting disorder in his brother; Hypertension in his father and mother.  Laboratory Chemistry Profile   Renal Lab Results  Component Value Date   BUN 6 (L) 07/18/2022   CREATININE 1.21 07/18/2022   BCR 9 (L) 02/25/2021   GFRAA 62 04/15/2019   GFRNONAA >60 07/18/2022    Hepatic Lab Results  Component Value Date   AST 20 07/18/2022   ALT 20 07/18/2022   ALBUMIN 3.7 07/18/2022   ALKPHOS 55 07/18/2022   LIPASE 19 03/15/2007    Electrolytes Lab Results  Component Value Date   NA 140 07/18/2022   K 3.4 (L) 07/18/2022   CL 103 07/18/2022   CALCIUM 9.0 07/18/2022   MG 2.2 04/15/2019    Bone Lab Results  Component Value Date   25OHVITD1 43 04/15/2019   25OHVITD2 <1.0 04/15/2019   25OHVITD3 43 04/15/2019    Inflammation (CRP: Acute Phase) (ESR: Chronic Phase) Lab Results  Component Value Date   CRP 2 04/15/2019   ESRSEDRATE 6 04/15/2019         Note: Above Lab results reviewed.  Recent Imaging Review  CT HEAD WO CONTRAST ( ) CLINICAL DATA:  Hypertensive with headache.  EXAM: CT HEAD WITHOUT CONTRAST  TECHNIQUE: Contiguous axial images were obtained from the base of the skull through the vertex without intravenous contrast.  RADIATION DOSE REDUCTION: This exam was performed according to the departmental dose-optimization program which includes automated exposure control, adjustment of the mA and/or kV according to patient size and/or use of iterative reconstruction technique.  COMPARISON:  January 30, 2019  FINDINGS: Brain: There is  mild cerebral atrophy with widening of the extra-axial spaces and ventricular dilatation. There are areas of decreased attenuation within the white matter tracts of the supratentorial brain, consistent with microvascular disease changes.  Vascular: No hyperdense vessel or unexpected calcification.  Skull: Normal. Negative for fracture or focal lesion.  Sinuses/Orbits: No acute finding.  Other: None.  IMPRESSION: 1. No acute intracranial abnormality. 2. Generalized cerebral atrophy and microvascular disease changes of the supratentorial brain.  Electronically Signed   By: Aram Candela M.D.   On: 07/19/2022 00:54 Note: Reviewed        Physical Exam  General appearance: Well nourished, well developed, and well hydrated. In no apparent acute distress Mental status: Alert, oriented x 3 (person, place, & time)       Respiratory: No evidence of acute respiratory distress Eyes: PERLA Vitals: BP 109/64 (BP Location: Right Arm, Patient Position: Sitting, Cuff Size: Large)   Pulse 66   Temp (!) 97.5 F (36.4 C) (Temporal)   Resp 16   Ht 5\' 11"  (1.803 m)   Wt 260 lb (117.9 kg)   SpO2 98%   BMI 36.26 kg/m  BMI: Estimated body mass index is 36.26 kg/m as calculated from the following:   Height as of this encounter: 5\' 11"  (1.803 m).   Weight as of this encounter: 260 lb (117.9 kg). Ideal: Ideal body weight: 75.3 kg (166 lb 0.1 oz) Adjusted ideal body weight: 92.4 kg (203 lb 9.7 oz)  Assessment   Diagnosis Status  1. Chronic low back pain (1ry area of Pain) (Bilateral) (R>L) w/o sciatica   2. Chronic sacroiliac joint pain (Bilateral) (R>L)   3. Lumbar facet joint syndrome (Bilateral) (R>L)   4. Grade 1 Anterolisthesis (2mm)  of lumbar spine of L4/L5   5. DDD (degenerative disc disease), lumbosacral   6. Lumbar facet hypertrophy (Multilevel)    7. 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 Somatic dysfunction of sacroiliac joints (Bilateral) (R>L)   9. Chronic  anticoagulation (Xarelto)    Worsened Recurring Controlled   Updated Problems: No problems updated.   Plan of Care  Problem-specific:  No problem-specific Assessment & Plan notes found for this encounter.  Mr. Jayanthony Dahman has a current medication list which includes the following long-term medication(s): allopurinol, candesartan, chlorthalidone, famotidine, potassium chloride sa, propranolol, rosuvastatin, spironolactone, and topiramate.  Pharmacotherapy (Medications Ordered): No orders of the defined types were placed in this encounter.  Orders:  Orders Placed This Encounter  Procedures   SACROILIAC JOINT INJECTION    Physical Examination Findings: Positive Sacral Thrust (Sacral Spring, Downward Pressure): (Y) Positive FABER maneuver (Patrick's): (Y) Positive SI distraction ([place]apping): (Y) Positive SI compression (Approximation): (Y) Positive Thigh Thrust:  (Y) Positive Gaenslen's: (Y) Positive Sacral Sulcus Tenderness: (Y)    Standing Status:   Future    Standing Expiration Date:   02/21/2023    Scheduling Instructions:     Procedure: Sacroiliac Joint Injection     Side  Laterality: Right-sided     Sedation: Patient's choice.     Timeframe: ASAP    Order Specific Question:   Where will this procedure be performed?    Answer:   [facility]   DG Lumbar Spine Complete W/Bend    Patient presents with axial pain with possible radicular component. Please assist [place] in identifying specific level(s) and laterality of any additional findings such as: 1. Facet (Zygapophyseal) joint DJD (Hypertrophy, space narrowing, subchondral sclerosis, and/or osteophyte formation) 2. DDD and/or IVDD (Loss of disc height, desiccation, gas patterns, osteophytes, endplate sclerosis, or "Black disc disease") 3. Pars defects 4. Spondylolisthesis, spondylosis, and/or spondyloarthropathies (include Degree/Grade of displacement in mm) (stability) 5. Vertebral body Fractures (acute/chronic)  (state percentage of collapse) 6. Demineralization (osteopenia/osteoporotic) 7. Bone pathology 8. Foraminal narrowing  9. Surgical changes    Standing Status:   Future    Standing Expiration Date:   12/22/2022    Scheduling Instructions:     Please make sure that the patient understands that this needs to be done as soon as possible. Never have the patient do the imaging "just before the next appointment". Inform patient that having the imaging done within the [facility] will expedite the availability of the results and will provide      imaging availability to the requesting physician. In addition inform the patient that the imaging order has an expiration date and will not be renewed if not done within the active period.    Order Specific Question:   Reason for Exam (SYMPTOM  OR DIAGNOSIS REQUIRED)    Answer:   Low back pain    Order Specific Question:   Preferred imaging location?    Answer:   [facility]    Order Specific Question:   Call Results- Best Contact Number?    Answer:   [phone] [facility])    Order Specific Question:   Radiology Contrast Protocol - do NOT remove file path    Answer:   [email or web address]    Order Specific Question:   Release to patient    Answer:   Immediate   DG Si Joints    Standing Status:   Future    Standing Expiration Date:   12/22/2022    Scheduling  Instructions:     Please make sure that the patient understands that this needs to be done as soon as possible. Never have the patient do the imaging "just before the next appointment". Inform patient that having the imaging done within the [facility] will expedite the availability of the results and will provide      imaging availability to the requesting physician. In addition inform the patient that the imaging order has an expiration date and will not be renewed if not done within the active period.    Order Specific Question:   Reason for Exam  (SYMPTOM  OR DIAGNOSIS REQUIRED)    Answer:   Sacroiliac joint pain    Order Specific Question:   Preferred imaging location?    Answer:   [facility]    Order Specific Question:   Call Results- Best Contact Number?    Answer:   [phone] [facility])    Order Specific Question:   Release to patient    Answer:   Immediate   Blood Thinner Instructions to Nursing    Always make sure patient has clearance from prescribing physician to stop blood thinners for interventional therapies. If the patient requires a Lovenox-bridge therapy, make sure arrangements are made to institute it with the assistance of the PCP.    Scheduling Instructions:     Have Mr. Szymborski stop the Xarelto (Rivaroxaban) x 3 days prior to procedure or surgery.   Follow-up plan:   Return for [facility]): (R) SI Blk #3, (Blood Thinner Protocol).      Interventional Therapies  Risk factors  Considerations:   NOTE: Xarelto Anticoagulation (Stop: 3 days  Restart: 6 hours) HTN   Planned  Pending:   Therapeutic left lumbar facet RFA #3    Under consideration:   Possible right opturator + femoral NB  Possible right opturator + femoral nerve RFA    Completed:   Therapeutic right IA hip joint inj. x2 (04/30/2019) (100/100/50/90-100) Diagnostic bilateral SI joint Blk x2 (03/10/2020) (50/50/100 x 5 wks/0)  Diagnostic bilateral lumbar facet MBB x1 (05/07/2020) (100/100/100 x1 week/>75)  Therapeutic left lumbar facet RFA x3 (06/16/2022) (100/100/100/100)  Therapeutic right lumbar facet RFA x3 (05/24/2022) (100/100/80/80) (by 06/16/2022 it had progressed to 100% ongoing relief)   Palliative options:   Palliative right IA hip joint inj.  Palliative bilateral SI joint Blk  Therapeutic/palliative lumbar facet RFA       Recent Visits No visits were found meeting these conditions. Showing recent visits within past 90 days and meeting all other requirements Today's Visits Date Type Provider Dept  11/21/22  Office Visit Delano Metz, MD [facility]  Showing today's visits and meeting all other requirements Future Appointments No visits were found meeting these conditions. Showing future appointments within next 90 days and meeting all other requirements  I discussed the assessment and treatment plan with the patient. The patient was provided an opportunity to ask questions and all were answered. The patient agreed with the plan and demonstrated an understanding of the instructions.  Patient advised to call back or seek an in-person evaluation if the symptoms or condition worsens.  Duration of encounter: 39 minutes.  Total time on encounter, as per AMA guidelines included both the face-to-face and non-face-to-face time personally spent by the physician and/or other qualified health care professional(s) on the day of the encounter (includes time in activities that require the physician or other qualified health care professional and does not include time in activities normally performed  by clinical staff). Physician's time may include the following activities when performed: Preparing to see the patient (e.g., pre-charting review of records, searching for previously ordered imaging, lab work, and nerve conduction tests) Review of prior analgesic pharmacotherapies. Reviewing PMP Interpreting ordered tests (e.g., lab work, imaging, nerve conduction tests) Performing post-procedure evaluations, including interpretation of diagnostic procedures Obtaining and/or reviewing separately obtained history Performing a medically appropriate examination and/or evaluation Counseling and educating the patient/family/caregiver Ordering medications, tests, or procedures Referring and communicating with other health care professionals (when not separately reported) Documenting clinical information in the electronic or other health record Independently interpreting results (not separately reported)  and communicating results to the patient/ family/caregiver Care coordination (not separately reported)  Note by: Oswaldo Done, MD Date: 11/21/2022; Time: 9:41 AM

## 2022-11-21 ENCOUNTER — Ambulatory Visit: Payer: Medicare Other | Attending: Pain Medicine | Admitting: Pain Medicine

## 2022-11-21 ENCOUNTER — Encounter: Payer: Self-pay | Admitting: Pain Medicine

## 2022-11-21 VITALS — BP 109/64 | HR 66 | Temp 97.5°F | Resp 16 | Ht 71.0 in | Wt 260.0 lb

## 2022-11-21 DIAGNOSIS — M545 Low back pain, unspecified: Secondary | ICD-10-CM | POA: Insufficient documentation

## 2022-11-21 DIAGNOSIS — M48061 Spinal stenosis, lumbar region without neurogenic claudication: Secondary | ICD-10-CM | POA: Diagnosis not present

## 2022-11-21 DIAGNOSIS — M9904 Segmental and somatic dysfunction of sacral region: Secondary | ICD-10-CM | POA: Diagnosis not present

## 2022-11-21 DIAGNOSIS — Z7901 Long term (current) use of anticoagulants: Secondary | ICD-10-CM | POA: Diagnosis not present

## 2022-11-21 DIAGNOSIS — M47816 Spondylosis without myelopathy or radiculopathy, lumbar region: Secondary | ICD-10-CM | POA: Diagnosis not present

## 2022-11-21 DIAGNOSIS — M533 Sacrococcygeal disorders, not elsewhere classified: Secondary | ICD-10-CM | POA: Insufficient documentation

## 2022-11-21 DIAGNOSIS — M4316 Spondylolisthesis, lumbar region: Secondary | ICD-10-CM | POA: Diagnosis not present

## 2022-11-21 DIAGNOSIS — M5137 Other intervertebral disc degeneration, lumbosacral region: Secondary | ICD-10-CM | POA: Diagnosis not present

## 2022-11-21 DIAGNOSIS — G8929 Other chronic pain: Secondary | ICD-10-CM | POA: Insufficient documentation

## 2022-11-21 NOTE — Progress Notes (Signed)
Patient is aware to stop blood thinner 3 days prior to procedure.

## 2022-11-21 NOTE — Progress Notes (Signed)
Safety precautions to be maintained throughout the outpatient stay will include: orient to surroundings, keep bed in low position, maintain call bell within reach at all times, provide assistance with transfer out of bed and ambulation.  

## 2022-11-21 NOTE — Patient Instructions (Signed)
____________________________________________________________________________________________  Blood Thinners  IMPORTANT NOTICE:  If you take any of these, make sure to notify the nursing staff.  Failure to do so may result in injury.  Recommended time intervals to stop and restart blood-thinners, before & after invasive procedures  Generic Name Brand Name Pre-procedure. Stop this long before procedure. Post-procedure. Minimum waiting period before restarting.  Abciximab Reopro 15 days 2 hrs  Alteplase Activase 10 days 10 days  Anagrelide Agrylin    Apixaban Eliquis 3 days 6 hrs  Cilostazol Pletal 3 days 5 hrs  Clopidogrel Plavix 7-10 days 2 hrs  Dabigatran Pradaxa 5 days 6 hrs  Dalteparin Fragmin 24 hours 4 hrs  Dipyridamole Aggrenox 11days 2 hrs  Edoxaban Lixiana; Savaysa 3 days 2 hrs  Enoxaparin  Lovenox 24 hours 4 hrs  Eptifibatide Integrillin 8 hours 2 hrs  Fondaparinux  Arixtra 72 hours 12 hrs  Hydroxychloroquine Plaquenil 11 days   Prasugrel Effient 7-10 days 6 hrs  Reteplase Retavase 10 days 10 days  Rivaroxaban Xarelto 3 days 6 hrs  Ticagrelor Brilinta 5-7 days 6 hrs  Ticlopidine Ticlid 10-14 days 2 hrs  Tinzaparin Innohep 24 hours 4 hrs  Tirofiban Aggrastat 8 hours 2 hrs  Warfarin Coumadin 5 days 2 hrs   Other medications with blood-thinning effects  Product indications Generic (Brand) names Note  Cholesterol Lipitor Stop 4 days before procedure  Blood thinner (injectable) Heparin (LMW or LMWH Heparin) Stop 24 hours before procedure  Cancer Ibrutinib (Imbruvica) Stop 7 days before procedure  Malaria/Rheumatoid Hydroxychloroquine (Plaquenil) Stop 11 days before procedure  Thrombolytics  10 days before or after procedures   Over-the-counter (OTC) Products with blood-thinning effects  Product Common names Stop Time  Aspirin > 325 mg Goody Powders, Excedrin, etc. 11 days  Aspirin ? 81 mg  7 days  Fish oil  4 days  Garlic supplements  7 days  Ginkgo biloba  36  hours  Ginseng  24 hours  NSAIDs Ibuprofen, Naprosyn, etc. 3 days  Vitamin E  4 days   ____________________________________________________________________________________________    ______________________________________________________________________  Procedure instructions  Do not eat or drink fluids (other than water) for 6 hours before your procedure  No water for 2 hours before your procedure  Take your blood pressure medicine with a sip of water  Arrive 30 minutes before your appointment  Carefully read the "Preparing for your procedure" detailed instructions  If you have questions call us at [phone]  _____________________________________________________________________    ______________________________________________________________________  Preparing for your procedure  Appointments: If you think you may not be able to keep your appointment, call 24-48 hours in advance to cancel. We need time to make it available to others.  During your procedure appointment there will be: No Prescription Refills. No disability issues to discussed. No medication changes or discussions.  Instructions: Food intake: Avoid eating anything solid for at least 8 hours prior to your procedure. Clear liquid intake: You may take clear liquids such as water up to 2 hours prior to your procedure. (No carbonated drinks. No soda.) Transportation: Unless otherwise stated by your physician, bring a driver. Morning Medicines: Except for blood thinners, take all of your other morning medications with a sip of water. Make sure to take your heart and blood pressure medicines. If your blood pressure's lower number is above 100, the case will be rescheduled. Blood thinners: Make sure to stop your blood thinners as instructed.  If you take a blood thinner, but were not instructed to stop it,   call our office [phone] and ask to talk to a nurse. Not stopping a blood thinner prior to certain  procedures could lead to serious complications. Diabetics on insulin: Notify the staff so that you can be scheduled 1st case in the morning. If your diabetes requires high dose insulin, take only  of your normal insulin dose the morning of the procedure and notify the staff that you have done so. Preventing infections: Shower with an antibacterial soap the morning of your procedure.  Build-up your immune system: Take 1000 mg of Vitamin C with every meal (3 times a day) the day prior to your procedure. Antibiotics: Inform the nursing staff if you are taking any antibiotics or if you have any conditions that may require antibiotics prior to procedures. (Example: recent joint implants)   Pregnancy: If you are pregnant make sure to notify the nursing staff. Not doing so may result in injury to the fetus, including death.  Sickness: If you have a cold, fever, or any active infections, call and cancel or reschedule your procedure. Receiving steroids while having an infection may result in complications. Arrival: You must be in the facility at least 30 minutes prior to your scheduled procedure. Tardiness: Your scheduled time is also the cutoff time. If you do not arrive at least 15 minutes prior to your procedure, you will be rescheduled.  Children: Do not bring any children with you. Make arrangements to keep them home. Dress appropriately: There is always a possibility that your clothing may get soiled. Avoid long dresses. Valuables: Do not bring any jewelry or valuables.  Reasons to call and reschedule or cancel your procedure: (Following these recommendations will minimize the risk of a serious complication.) Surgeries: Avoid having procedures within 2 weeks of any surgery. (Avoid for 2 weeks before or after any surgery). Flu Shots: Avoid having procedures within 2 weeks of a flu shots or . (Avoid for 2 weeks before or after immunizations). Barium: Avoid having a procedure within 7-10 days after having  had a radiological study involving the use of radiological contrast. (Myelograms, Barium swallow or enema study). Heart attacks: Avoid any elective procedures or surgeries for the initial 6 months after a "Myocardial Infarction" (Heart Attack). Blood thinners: It is imperative that you stop these medications before procedures. Let us know if you if you take any blood thinner.  Infection: Avoid procedures during or within two weeks of an infection (including chest colds or gastrointestinal problems). Symptoms associated with infections include: Localized redness, fever, chills, night sweats or profuse sweating, burning sensation when voiding, cough, congestion, stuffiness, runny nose, sore throat, diarrhea, nausea, vomiting, cold or Flu symptoms, recent or current infections. It is specially important if the infection is over the area that we intend to treat. Heart and lung problems: Symptoms that may suggest an active cardiopulmonary problem include: cough, chest pain, breathing difficulties or shortness of breath, dizziness, ankle swelling, uncontrolled high or unusually low blood pressure, and/or palpitations. If you are experiencing any of these symptoms, cancel your procedure and contact your primary care physician for an evaluation.  Remember:  Regular Business hours are:  Monday to Thursday 8:00 AM to 4:00 PM  Provider's Schedule: Alaa Eyerman, MD:  Procedure days: Tuesday and Thursday 7:30 AM to 4:00 PM  Bilal Lateef, MD:  Procedure days: Monday and Wednesday 7:30 AM to 4:00 PM  ______________________________________________________________________    ____________________________________________________________________________________________  General Risks and Possible Complications  Patient Responsibilities: It is important that you read this as it   is part of your informed consent. It is our duty to inform you of the risks and possible complications associated with treatments  offered to you. It is your responsibility as a patient to read this and to ask questions about anything that is not clear or that you believe was not covered in this document.  Patient's Rights: You have the right to refuse treatment. You also have the right to change your mind, even after initially having agreed to have the treatment done. However, under this last option, if you wait until the last second to change your mind, you may be charged for the materials used up to that point.  Introduction: Medicine is not an exact science. Everything in Medicine, including the lack of treatment(s), carries the potential for danger, harm, or loss (which is by definition: Risk). In Medicine, a complication is a secondary problem, condition, or disease that can aggravate an already existing one. All treatments carry the risk of possible complications. The fact that a side effects or complications occurs, does not imply that the treatment was conducted incorrectly. It must be clearly understood that these can happen even when everything is done following the highest safety standards.  No treatment: You can choose not to proceed with the proposed treatment alternative. The "PRO(s)" would include: avoiding the risk of complications associated with the therapy. The "CON(s)" would include: not getting any of the treatment benefits. These benefits fall under one of three categories: diagnostic; therapeutic; and/or palliative. Diagnostic benefits include: getting information which can ultimately lead to improvement of the disease or symptom(s). Therapeutic benefits are those associated with the successful treatment of the disease. Finally, palliative benefits are those related to the decrease of the primary symptoms, without necessarily curing the condition (example: decreasing the pain from a flare-up of a chronic condition, such as incurable terminal cancer).  General Risks and Complications: These are associated to most  interventional treatments. They can occur alone, or in combination. They fall under one of the following six (6) categories: no benefit or worsening of symptoms; bleeding; infection; nerve damage; allergic reactions; and/or death. No benefits or worsening of symptoms: In Medicine there are no guarantees, only probabilities. No healthcare provider can ever guarantee that a medical treatment will work, they can only state the probability that it may. Furthermore, there is always the possibility that the condition may worsen, either directly, or indirectly, as a consequence of the treatment. Bleeding: This is more common if the patient is taking a blood thinner, either prescription or over the counter (example: Goody Powders, Fish oil, Aspirin, Garlic, etc.), or if suffering a condition associated with impaired coagulation (example: Hemophilia, cirrhosis of the liver, low platelet counts, etc.). However, even if you do not have one on these, it can still happen. If you have any of these conditions, or take one of these drugs, make sure to notify your treating physician. Infection: This is more common in patients with a compromised immune system, either due to disease (example: diabetes, cancer, human immunodeficiency virus [HIV], etc.), or due to medications or treatments (example: therapies used to treat cancer and rheumatological diseases). However, even if you do not have one on these, it can still happen. If you have any of these conditions, or take one of these drugs, make sure to notify your treating physician. Nerve Damage: This is more common when the treatment is an invasive one, but it can also happen with the use of medications, such as those used in the treatment   of cancer. The damage can occur to small secondary nerves, or to large primary ones, such as those in the spinal cord and brain. This damage may be temporary or permanent and it may lead to impairments that can range from temporary numbness to  permanent paralysis and/or brain death. Allergic Reactions: Any time a substance or material comes in contact with our body, there is the possibility of an allergic reaction. These can range from a mild skin rash (contact dermatitis) to a severe systemic reaction (anaphylactic reaction), which can result in death. Death: In general, any medical intervention can result in death, most of the time due to an unforeseen complication. ____________________________________________________________________________________________    

## 2022-11-22 DIAGNOSIS — U071 COVID-19: Secondary | ICD-10-CM | POA: Diagnosis not present

## 2022-11-28 ENCOUNTER — Telehealth: Payer: Self-pay

## 2022-11-28 NOTE — Telephone Encounter (Signed)
Noted  

## 2022-11-28 NOTE — Telephone Encounter (Signed)
He started taking paxlovid Wednesday July 3. He has covid. He is cancelling his procedure tomorrow.

## 2022-11-29 ENCOUNTER — Ambulatory Visit: Payer: No Typology Code available for payment source | Admitting: Pain Medicine

## 2022-12-05 DIAGNOSIS — D352 Benign neoplasm of pituitary gland: Secondary | ICD-10-CM | POA: Diagnosis not present

## 2022-12-12 ENCOUNTER — Ambulatory Visit: Payer: No Typology Code available for payment source | Admitting: Pain Medicine

## 2022-12-15 ENCOUNTER — Encounter: Payer: Self-pay | Admitting: Pain Medicine

## 2022-12-15 ENCOUNTER — Ambulatory Visit: Payer: No Typology Code available for payment source | Attending: Pain Medicine | Admitting: Pain Medicine

## 2022-12-15 ENCOUNTER — Ambulatory Visit
Admission: RE | Admit: 2022-12-15 | Discharge: 2022-12-15 | Disposition: A | Discharge status: Home / Self Care | Payer: No Typology Code available for payment source | Source: Ambulatory Visit | Attending: Pain Medicine | Admitting: Pain Medicine

## 2022-12-15 VITALS — BP 116/93 | HR 58 | Temp 96.8°F | Resp 18 | Ht 71.0 in | Wt 260.0 lb

## 2022-12-15 DIAGNOSIS — M9904 Segmental and somatic dysfunction of sacral region: Secondary | ICD-10-CM | POA: Diagnosis not present

## 2022-12-15 DIAGNOSIS — M47898 Other spondylosis, sacral and sacrococcygeal region: Secondary | ICD-10-CM | POA: Diagnosis not present

## 2022-12-15 DIAGNOSIS — Z7901 Long term (current) use of anticoagulants: Secondary | ICD-10-CM | POA: Diagnosis not present

## 2022-12-15 DIAGNOSIS — M533 Sacrococcygeal disorders, not elsewhere classified: Secondary | ICD-10-CM | POA: Insufficient documentation

## 2022-12-15 DIAGNOSIS — G8929 Other chronic pain: Secondary | ICD-10-CM | POA: Diagnosis not present

## 2022-12-15 DIAGNOSIS — Z880 Allergy status to penicillin: Secondary | ICD-10-CM | POA: Insufficient documentation

## 2022-12-15 DIAGNOSIS — Z5189 Encounter for other specified aftercare: Secondary | ICD-10-CM

## 2022-12-15 DIAGNOSIS — M545 Low back pain, unspecified: Secondary | ICD-10-CM

## 2022-12-15 DIAGNOSIS — Z888 Allergy status to other drugs, medicaments and biological substances status: Secondary | ICD-10-CM | POA: Insufficient documentation

## 2022-12-15 MED ORDER — ROPIVACAINE HCL 2 MG/ML IJ SOLN
4.0000 mL | Freq: Once | INTRAMUSCULAR | Status: AC
  Administered 2022-12-15: 4 mL via INTRA_ARTICULAR
  Filled 2022-12-15: qty 20

## 2022-12-15 MED ORDER — LIDOCAINE HCL 2 % IJ SOLN
20.0000 mL | Freq: Once | INTRAMUSCULAR | Status: AC
  Administered 2022-12-15: 100 mg
  Filled 2022-12-15: qty 40

## 2022-12-15 MED ORDER — LACTATED RINGERS IV SOLN: Freq: Once | INTRAVENOUS | Status: AC

## 2022-12-15 MED ORDER — PENTAFLUOROPROP-TETRAFLUOROETH EX AERO
INHALATION_SPRAY | Freq: Once | CUTANEOUS | Status: AC
  Administered 2022-12-15: 30 via TOPICAL
  Filled 2022-12-15: qty 30

## 2022-12-15 MED ORDER — MIDAZOLAM HCL 2 MG/2ML IJ SOLN
0.5000 mg | Freq: Once | INTRAMUSCULAR | Status: AC
  Administered 2022-12-15: 2 mg via INTRAVENOUS
  Filled 2022-12-15: qty 2

## 2022-12-15 MED ORDER — METHYLPREDNISOLONE ACETATE 80 MG/ML IJ SUSP
80.0000 mg | Freq: Once | INTRAMUSCULAR | Status: AC
  Administered 2022-12-15: 80 mg via INTRA_ARTICULAR
  Filled 2022-12-15: qty 1

## 2022-12-15 NOTE — Progress Notes (Signed)
PROVIDER NOTE: Interpretation of information contained herein should be left to medically-trained personnel. Specific patient instructions are provided elsewhere under "Patient Instructions" section of medical record. This document was created in part using STT-dictation technology, any transcriptional errors that may result from this process are unintentional.  Patient: [name] Whitaker Type: Established DOB: January 10, 1951 MRN: [id number] PCP: [facility]  Service: Procedure DOS: 12/15/2022 Setting: Ambulatory Location: Ambulatory outpatient facility Delivery: Face-to-face Provider: Oswaldo Done, MD Specialty: Interventional Pain Management Specialty designation: 09 Location: Outpatient facility Ref. Prov.: Delano Metz, MD       Interventional Therapy   Procedure: Sacroiliac Joint Steroid Injection #3    Laterality: Right     Level: PSIS (Posterior Superior Iliac Spine)  Imaging: Fluoroscopic guidance Anesthesia: Local anesthesia (1-2% Lidocaine) Anxiolysis: IV Versed         Sedation: No Sedation                       DOS: 12/15/2022  Performed by: Oswaldo Done, MD  Purpose: Diagnostic/Therapeutic Indications: Sacroiliac joint pain in the lower back and hip area severe enough to impact quality of life or function. Rationale (medical necessity): procedure needed and proper for the diagnosis and/or treatment of [name] Whitaker's medical symptoms and needs. 1. Chronic sacroiliac joint pain (Bilateral) (R>L)   2. Other spondylosis, sacral and sacrococcygeal region   3. Somatic dysfunction of sacroiliac joints (Bilateral) (R>L)   4. Encounter for therapeutic procedure   5. Chronic anticoagulation (Xarelto)    NAS-11 Pain score:   Pre-procedure: 8 /10   Post-procedure: 8 /10     Target: Interarticular sacroiliac joint. Location: Medial to the postero-medial edge of iliac spine. Region: Lumbosacral-sacrococcygeal. Approach: Superior postero-medial  percutaneous approach. Type of procedure: Percutaneous joint injection.  Position / Prep / Materials:  Position: Prone  Prep solution: DuraPrep (Iodine Povacrylex [0.7% available iodine] and Isopropyl Alcohol, 74% w/w) Prep Area: Entire posterior lumbosacral area  Materials:  Tray: Block Needle(s):  Type: Spinal  Gauge (G): 22  Length: 5-in Qty: 1  H&P (Pre-op Assessment):  [name] Whitaker is a 72 y.o. (year old), male patient, seen today for interventional treatment. He  has a past surgical history that includes Appendectomy; Nasal septum surgery; Tonsillectomy; Testicle surgery; Cardiac catheterization; Back surgery; Cataract extraction w/PHACO (Right, 10/20/2020); and Cataract extraction w/PHACO (Left, 11/03/2020). [name] Whitaker has a current medication list which includes the following prescription(s): acetaminophen, allopurinol, ascorbic acid, candesartan, chlorthalidone, cholecalciferol, cyanocobalamin, cyclobenzaprine, dulaglutide, epinephrine, famotidine, fexofenadine, finasteride, fluticasone, gabapentin, hydrocortisone, ketoconazole, lidocaine, potassium chloride sa, propranolol, rivaroxaban, rosuvastatin, and topiramate, and the following Facility-Administered Medications: lactated ringers. His primarily concern today is the No chief complaint on file.  Initial Vital Signs:  Pulse/HCG Rate: (!) 58ECG Heart Rate: 77 Temp: (!) 96.8 F (36 C) Resp: 16 BP: 109/74 SpO2: 100 %  BMI: Estimated body mass index is 36.26 kg/m as calculated from the following:   Height as of this encounter: 5\' 11"  (1.803 m).   Weight as of this encounter: 260 lb (117.9 kg).  Risk Assessment: Allergies: Reviewed. He is allergic to penicillins, amlodipine, amlodipine besylate, atorvastatin, erenumab-aooe, lisinopril, penicillin g, and simvastatin.  Allergy Precautions: None required Coagulopathies: Reviewed. None identified.  Blood-thinner therapy: None at this time Active Infection(s): Reviewed. None  identified. [name] Whitaker is afebrile  Site Confirmation: [name] Whitaker was asked to confirm the procedure and laterality before marking the site Procedure checklist: Completed Consent: Before the procedure and under the influence of no sedative(s),  amnesic(s), or anxiolytics, the patient was informed of the treatment options, risks and possible complications. To fulfill our ethical and legal obligations, as recommended by the American Medical Association's Code of Ethics, I have informed the patient of my clinical impression; the nature and purpose of the treatment or procedure; the risks, benefits, and possible complications of the intervention; the alternatives, including doing nothing; the risk(s) and benefit(s) of the alternative treatment(s) or procedure(s); and the risk(s) and benefit(s) of doing nothing. The patient was provided information about the general risks and possible complications associated with the procedure. These may include, but are not limited to: failure to achieve desired goals, infection, bleeding, organ or nerve damage, allergic reactions, paralysis, and death. In addition, the patient was informed of those risks and complications associated to the procedure, such as failure to decrease pain; infection; bleeding; organ or nerve damage with subsequent damage to sensory, motor, and/or autonomic systems, resulting in permanent pain, numbness, and/or weakness of one or several areas of the body; allergic reactions; (i.e.: anaphylactic reaction); and/or death. Furthermore, the patient was informed of those risks and complications associated with the medications. These include, but are not limited to: allergic reactions (i.e.: anaphylactic or anaphylactoid reaction(s)); adrenal axis suppression; blood sugar elevation that in diabetics may result in ketoacidosis or comma; water retention that in patients with history of congestive heart failure may result in shortness of breath, pulmonary  edema, and decompensation with resultant heart failure; weight gain; swelling or edema; medication-induced neural toxicity; particulate matter embolism and blood vessel occlusion with resultant organ, and/or nervous system infarction; and/or aseptic necrosis of one or more joints. Finally, the patient was informed that Medicine is not an exact science; therefore, there is also the possibility of unforeseen or unpredictable risks and/or possible complications that may result in a catastrophic outcome. The patient indicated having understood very clearly. We have given the patient no guarantees and we have made no promises. Enough time was given to the patient to ask questions, all of which were answered to the patient's satisfaction. Mr. Herbers has indicated that he wanted to continue with the procedure. Attestation: I, the ordering provider, attest that I have discussed with the patient the benefits, risks, side-effects, alternatives, likelihood of achieving goals, and potential problems during recovery for the procedure that I have provided informed consent. Date  Time: 12/15/2022 11:11 AM  Pre-Procedure Preparation:  Monitoring: As per clinic protocol. Respiration, ETCO2, SpO2, BP, heart rate and rhythm monitor placed and checked for adequate function Safety Precautions: Patient was assessed for positional comfort and pressure points before starting the procedure. Time-out: I initiated and conducted the "Time-out" before starting the procedure, as per protocol. The patient was asked to participate by confirming the accuracy of the "Time Out" information. Verification of the correct person, site, and procedure were performed and confirmed by me, the nursing staff, and the patient. "Time-out" conducted as per Joint Commission's Universal Protocol (UP.01.01.01). Time: 1205 Start Time: 1205 hrs.  Description/Narrative of Procedure:          Start Time: 1205 hrs.  Rationale (medical necessity):  procedure needed and proper for the diagnosis and/or treatment of the patient's medical symptoms and needs. Procedural Technique Safety Precautions: Aspiration looking for blood return was conducted prior to all injections. At no point did we inject any substances, as a needle was being advanced. No attempts were made at seeking any paresthesias. Safe injection practices and needle disposal techniques used. Medications properly checked for expiration dates. SDV (single dose  vial) medications used. Description of the Procedure: Protocol guidelines were followed. The patient was assisted into a comfortable position. The target area was identified and the area prepped in the usual manner. Skin & deeper tissues infiltrated with local anesthetic. Appropriate amount of time allowed to pass for local anesthetics to take effect. The procedure needles were then advanced to the target area. Proper needle placement secured. Negative aspiration confirmed. Solution injected in intermittent fashion, asking for systemic symptoms every 0.5cc of injectate. The needles were then removed and the area cleansed, making sure to leave some of the prepping solution back to take advantage of its long term bactericidal properties.  Technical description of procedure:  Fluoroscopy using a posterior anterior 45 degree angle from the midline aiming at the anterolateral aspect of the patient was used to find a direct path into the sacroiliac joint, the superior medial to posterior superior iliac spine.  The skin was marked where the desired target and the skin infiltrated with local anesthetics.  The procedure needle was then advanced until the joint was entered.  Once inside of the joint, we then proceeded to inject the desired solution.  Vitals:   12/15/22 1058 12/15/22 1205 12/15/22 1212  BP: 109/74 125/87 (!) 116/93  Pulse: (!) 58    Resp: 16 18 18   Temp: (!) 96.8 F (36 C)    TempSrc: Temporal    SpO2: 100% 95% 97%  Weight:  260 lb (117.9 kg)    Height: 5\' 11"  (1.803 m)       End Time: 1210 hrs.  Imaging Guidance (Non-Spinal):          Type of Imaging Technique: Fluoroscopy Guidance (Non-Spinal) Indication(s): Assistance in needle guidance and placement for procedures requiring needle placement in or near specific anatomical locations not easily accessible without such assistance. Exposure Time: Please see nurses notes. Contrast: None used. Fluoroscopic Guidance: I was personally present during the use of fluoroscopy. "Tunnel Vision Technique" used to obtain the best possible view of the target area. Parallax error corrected before commencing the procedure. "Direction-depth-direction" technique used to introduce the needle under continuous pulsed fluoroscopy. Once target was reached, antero-posterior, oblique, and lateral fluoroscopic projection used confirm needle placement in all planes. Images permanently stored in EMR. Interpretation: No contrast injected. I personally interpreted the imaging intraoperatively. Adequate needle placement confirmed in multiple planes. Permanent images saved into the patient's record.  Post-operative Assessment:  Post-procedure Vital Signs:  Pulse/HCG Rate: (!) 5872 Temp: (!) 96.8 F (36 C) Resp: 18 BP: (!) 116/93 SpO2: 97 %  EBL: None  Complications: No immediate post-treatment complications observed by team, or reported by patient.  Note: The patient tolerated the entire procedure well. A repeat set of vitals were taken after the procedure and the patient was kept under observation following institutional policy, for this type of procedure. Post-procedural neurological assessment was performed, showing return to baseline, prior to discharge. The patient was provided with post-procedure discharge instructions, including a section on how to identify potential problems. Should any problems arise concerning this procedure, the patient was given instructions to immediately  contact us, at any time, without hesitation. In any case, we plan to contact the patient by telephone for a follow-up status report regarding this interventional procedure.  Comments:  No additional relevant information.  Plan of Care (POC)  Orders:  Orders Placed This Encounter  Procedures   SACROILIAC JOINT INJECTION    Scheduling Instructions:     Side: Right-sided     Sedation: Patient's  choice.     Timeframe: Today    Order Specific Question:   Where will this procedure be performed?    Answer:   [facility]   DG [facility] C-ARM 1-60 MIN NO REPORT    Intraoperative interpretation by procedural physician at [facility].    Standing Status:   Standing    Number of Occurrences:   1    Order Specific Question:   Reason for exam:    Answer:   Assistance in needle guidance and placement for procedures requiring needle placement in or near specific anatomical locations not easily accessible without such assistance.   Informed Consent Details: Physician/Practitioner Attestation; Transcribe to consent form and obtain patient signature    Nursing Order: Transcribe to consent form and obtain patient signature. Note: Always confirm laterality of pain with Mr. Helle, before procedure.    Order Specific Question:   Physician/Practitioner attestation of informed consent for procedure/surgical case    Answer:   I, the physician/practitioner, attest that I have discussed with the patient the benefits, risks, side effects, alternatives, likelihood of achieving goals and potential problems during recovery for the procedure that I have provided informed consent.    Order Specific Question:   Procedure    Answer:   Sacroiliac Joint Block    Order Specific Question:   Physician/Practitioner performing the procedure    Answer:   [place] Bryngelson A. Laban Emperor, MD    Order Specific Question:   Indication/Reason    Answer:   Chronic Low Back and Hip Pain secondary to Sacroiliac Joint Pain  (Arthralgia/Arthropathy)   Provide equipment / supplies at bedside    Procedure tray: "Block Tray" (Disposable  single use) Skin infiltration needle: Regular 1.5-in, 25-G, (x1) Block Needle type: Spinal Amount/quantity: 1 Size: Medium (5-inch) Gauge: 22G    Standing Status:   Standing    Number of Occurrences:   1    Order Specific Question:   Specify    Answer:   Block Tray   Care order/instruction: Please confirm that the patient has stopped the Xarelto (Rivaroxaban) x 3 days prior to procedure or surgery.    Please confirm that the patient has stopped the Xarelto (Rivaroxaban) x 3 days prior to procedure or surgery.    Standing Status:   Standing    Number of Occurrences:   1   Chronic Opioid Analgesic:  No opioid analgesics prescribed by our practice. Highest recorded MME/day: 26.67 mg/day MME/day: 0 mg/day   Medications ordered for procedure: Meds ordered this encounter  Medications   lidocaine (XYLOCAINE) 2 % (with pres) injection 400 mg   pentafluoroprop-tetrafluoroeth (GEBAUERS) aerosol   lactated ringers infusion   midazolam (VERSED) injection 0.5-2 mg    Make sure Flumazenil is available in the pyxis when using this medication. If oversedation occurs, administer 0.2 mg IV over 15 sec. If after 45 sec no response, administer 0.2 mg again over 1 min; may repeat at 1 min intervals; not to exceed 4 doses (1 mg)   ropivacaine (PF) 2 mg/mL (0.2%) (NAROPIN) injection 4 mL   methylPREDNISolone acetate (DEPO-MEDROL) injection 80 mg   Medications administered: We administered lidocaine, pentafluoroprop-tetrafluoroeth, lactated ringers, midazolam, ropivacaine (PF) 2 mg/mL (0.2%), and methylPREDNISolone acetate.  See the medical record for exact dosing, route, and time of administration.  Follow-up plan:   Return in about 2 weeks (around 12/29/2022) for Proc-day (T,Th), (Face2F), (PPE).       Interventional Therapies  Risk factors  Considerations:   NOTE:  Xarelto  Anticoagulation (Stop: 3 days  Restart: 6 hours) HTN   Planned  Pending:   Therapeutic left lumbar facet RFA #3    Under consideration:   Possible right opturator + femoral NB  Possible right opturator + femoral nerve RFA    Completed:   Therapeutic right IA hip joint inj. x2 (04/30/2019) (100/100/50/90-100) Diagnostic bilateral SI joint Blk x2 (03/10/2020) (50/50/100 x 5 wks/0)  Diagnostic bilateral lumbar facet MBB x1 (05/07/2020) (100/100/100 x1 week/>75)  Therapeutic left lumbar facet RFA x3 (06/16/2022) (100/100/100/100)  Therapeutic right lumbar facet RFA x3 (05/24/2022) (100/100/80/80) (by 06/16/2022 it had progressed to 100% ongoing relief)   Palliative options:   Palliative right IA hip joint inj.  Palliative bilateral SI joint Blk  Therapeutic/palliative lumbar facet RFA        Recent Visits Date Type Provider Dept  11/21/22 Office Visit Delano Metz, MD [facility]  Showing recent visits within past 90 days and meeting all other requirements Today's Visits Date Type Provider Dept  12/15/22 Procedure visit Delano Metz, MD [facility]  Showing today's visits and meeting all other requirements Future Appointments Date Type Provider Dept  01/10/23 Appointment Delano Metz, MD [facility]  Showing future appointments within next 90 days and meeting all other requirements  Disposition: Discharge home  Discharge (Date  Time): 12/15/2022; 1230 hrs.   Primary Care Physician: [facility] Location[facility] Note by: Oswaldo Done, MD (TTS technology used. I apologize for any typographical errors that were not detected and corrected.) Date: 12/15/2022; Time: 12:51 PM  Disclaimer:  Medicine is not an Visual merchandiser. The only guarantee in medicine is that nothing is guaranteed. It is important to note that the decision to proceed with this intervention was based on the  information collected from the patient. The Data and conclusions were drawn from the patient's questionnaire, the interview, and the physical examination. Because the information was provided in large part by the patient, it cannot be guaranteed that it has not been purposely or unconsciously manipulated. Every effort has been made to obtain as much relevant data as possible for this evaluation. It is important to note that the conclusions that lead to this procedure are derived in large part from the available data. Always take into account that the treatment will also be dependent on availability of resources and existing treatment guidelines, considered by other Pain Management Practitioners as being common knowledge and practice, at the time of the intervention. For Medico-Legal purposes, it is also important to point out that variation in procedural techniques and pharmacological choices are the acceptable norm. The indications, contraindications, technique, and results of the above procedure should only be interpreted and judged by a Board-Certified Interventional Pain Specialist with extensive familiarity and expertise in the same exact procedure and technique.

## 2022-12-15 NOTE — Patient Instructions (Addendum)
Sacroiliac (SI) Joint Injection Patient Information  Description: The sacroiliac joint connects the scrum (very low back and tailbone) to the ilium (a pelvic bone which also forms half of the hip joint).  Normally this joint experiences very little motion.  When this joint becomes inflamed or unstable low back and or hip and pelvis pain may result.  Injection of this joint with local anesthetics (numbing medicines) and steroids can provide diagnostic information and reduce pain.  This injection is performed with the aid of x-ray guidance into the tailbone area while you are lying on your stomach.   You may experience an electrical sensation down the leg while this is being done.  You may also experience numbness.  We also may ask if we are reproducing your normal pain during the injection.  Conditions which may be treated SI injection:  Low back, buttock, hip or leg pain  Preparation for the Injection:  Do not eat any solid food or dairy products within 8 hours of your appointment.  You may drink clear liquids up to 3 hours before appointment.  Clear liquids include water, black coffee, juice or soda.  No milk or cream please. You may take your regular medications, including pain medications with a sip of water before your appointment.  Diabetics should hold regular insulin (if take separately) and take 1/2 normal NPH dose the morning of the procedure.  Carry some sugar containing items with you to your appointment. A driver must accompany you and be prepared to drive you home after your procedure. Bring all of your current medications with you. An IV may be inserted and sedation may be given at the discretion of the physician. A blood pressure cuff, EKG and other monitors will often be applied during the procedure.  Some patients may need to have extra oxygen administered for a short period.  You will be asked to provide medical information, including your allergies, prior to the procedure.  We  must know immediately if you are taking blood thinners (like Coumadin/Warfarin) or if you are allergic to IV iodine contrast (dye).  We must know if you could possible be pregnant.  Possible side effects:  Bleeding from needle site Infection (rare, may require surgery) Nerve injury (rare) Numbness & tingling (temporary) A brief convulsion or seizure Light-headedness (temporary) Pain at injection site (several days) Decreased blood pressure (temporary) Weakness in the leg (temporary)   Call if you experience:  New onset weakness or numbness of an extremity below the injection site that last more than 8 hours. Hives or difficulty breathing ( go to the emergency room) Inflammation or drainage at the injection site Any new symptoms which are concerning to you  Please note:  Although the local anesthetic injected can often make your back/ hip/ buttock/ leg feel good for several hours after the injections, the pain will likely return.  It takes 3-7 days for steroids to work in the sacroiliac area.  You may not notice any pain relief for at least that one week.  If effective, we will often do a series of three injections spaced 3-6 weeks apart to maximally decrease your pain.  After the initial series, we generally will wait some months before a repeat injection of the same type.  If you have any questions, please call [phone] [facility]   ____________________________________________________________________________________________  Post-Procedure Discharge Instructions  Instructions: Apply ice:  Purpose: This will minimize any swelling and discomfort after procedure.  When: Day of  procedure, as soon as you get home. How: Fill a plastic sandwich bag with crushed ice. Cover it with a small towel and apply to injection site. How long: (15 min on, 15 min off) Apply for 15 minutes then remove x 15 minutes.  Repeat sequence on day of procedure,  until you go to bed. Apply heat:  Purpose: To treat any soreness and discomfort from the procedure. When: Starting the next day after the procedure. How: Apply heat to procedure site starting the day following the procedure. How long: May continue to repeat daily, until discomfort goes away. Food intake: Start with clear liquids (like water) and advance to regular food, as tolerated.  Physical activities: Keep activities to a minimum for the first 8 hours after the procedure. After that, then as tolerated. Driving: If you have received any sedation, be responsible and do not drive. You are not allowed to drive for 24 hours after having sedation. Blood thinner: (Applies only to those taking blood thinners) You may restart your blood thinner 6 hours after your procedure. Insulin: (Applies only to Diabetic patients taking insulin) As soon as you can eat, you may resume your normal dosing schedule. Infection prevention: Keep procedure site clean and dry. Shower daily and clean area with soap and water. Post-procedure Pain Diary: Extremely important that this be done correctly and accurately. Recorded information will be used to determine the next step in treatment. For the purpose of accuracy, follow these rules: Evaluate only the area treated. Do not report or include pain from an untreated area. For the purpose of this evaluation, ignore all other areas of pain, except for the treated area. After your procedure, avoid taking a long nap and attempting to complete the pain diary after you wake up. Instead, set your alarm clock to go off every hour, on the hour, for the initial 8 hours after the procedure. Document the duration of the numbing medicine, and the relief you are getting from it. Do not go to sleep and attempt to complete it later. It will not be accurate. If you received sedation, it is likely that you were given a medication that may cause amnesia. Because of this, completing the diary at a  later time may cause the information to be inaccurate. This information is needed to plan your care. Follow-up appointment: Keep your post-procedure follow-up evaluation appointment after the procedure (usually 2 weeks for most procedures, 6 weeks for radiofrequencies). DO NOT FORGET to bring you pain diary with you.   Expect: (What should I expect to see with my procedure?) From numbing medicine (AKA: Local Anesthetics): Numbness or decrease in pain. You may also experience some weakness, which if present, could last for the duration of the local anesthetic. Onset: Full effect within 15 minutes of injected. Duration: It will depend on the type of local anesthetic used. On the average, 1 to 8 hours.  From steroids (Applies only if steroids were used): Decrease in swelling or inflammation. Once inflammation is improved, relief of the pain will follow. Onset of benefits: Depends on the amount of swelling present. The more swelling, the longer it will take for the benefits to be seen. In some cases, up to 10 days. Duration: Steroids will stay in the system x 2 weeks. Duration of benefits will depend on multiple posibilities including persistent irritating factors. Side-effects: If present, they may typically last 2 weeks (the duration of the steroids). Frequent: Cramps (if they occur, drink Gatorade and take over-the-counter Magnesium 450-500 mg  once to twice a day); water retention with temporary weight gain; increases in blood sugar; decreased immune system response; increased appetite. Occasional: Facial flushing (red, warm cheeks); mood swings; menstrual changes. Uncommon: Long-term decrease or suppression of natural hormones; bone thinning. (These are more common with higher doses or more frequent use. This is why we prefer that our patients avoid having any injection therapies in other practices.)  Very Rare: Severe mood changes; psychosis; aseptic necrosis. From procedure: Some discomfort is to be  expected once the numbing medicine wears off. This should be minimal if ice and heat are applied as instructed.  Call if: (When should I call?) You experience numbness and weakness that gets worse with time, as opposed to wearing off. New onset bowel or bladder incontinence. (Applies only to procedures done in the spine)  Emergency Numbers: Durning business hours (Monday - Thursday, 8:00 AM - 4:00 PM) (Friday, 9:00 AM - 12:00 Noon): [phone] After hours: [phone] NOTE: If you are having a problem and are unable connect with, or to talk to a provider, then go to your nearest urgent care or emergency department. If the problem is serious and urgent, please call [phone]. ____________________________________________________________________________________________    ____________________________________________________________________________________________  Blood Thinners  IMPORTANT NOTICE:  If you take any of these, make sure to notify the nursing staff.  Failure to do so may result in injury.  Recommended time intervals to stop and restart blood-thinners, before & after invasive procedures  Generic Name Brand Name Pre-procedure. Stop this long before procedure. Post-procedure. Minimum waiting period before restarting.  Abciximab Reopro 15 days 2 hrs  Alteplase Activase 10 days 10 days  Anagrelide Agrylin    Apixaban Eliquis 3 days 6 hrs  Cilostazol Pletal 3 days 5 hrs  Clopidogrel Plavix 7-10 days 2 hrs  Dabigatran Pradaxa 5 days 6 hrs  Dalteparin Fragmin 24 hours 4 hrs  Dipyridamole Aggrenox 11days 2 hrs  Edoxaban Lixiana; Savaysa 3 days 2 hrs  Enoxaparin  Lovenox 24 hours 4 hrs  Eptifibatide Integrillin 8 hours 2 hrs  Fondaparinux  Arixtra 72 hours 12 hrs  Hydroxychloroquine Plaquenil 11 days   Prasugrel Effient 7-10 days 6 hrs  Reteplase Retavase 10 days 10 days  Rivaroxaban Xarelto 3 days 6 hrs  Ticagrelor Brilinta 5-7 days 6 hrs  Ticlopidine Ticlid 10-14 days 2  hrs  Tinzaparin Innohep 24 hours 4 hrs  Tirofiban Aggrastat 8 hours 2 hrs  Warfarin Coumadin 5 days 2 hrs   Other medications with blood-thinning effects  Product indications Generic (Brand) names Note  Cholesterol Lipitor Stop 4 days before procedure  Blood thinner (injectable) Heparin (LMW or LMWH Heparin) Stop 24 hours before procedure  Cancer Ibrutinib (Imbruvica) Stop 7 days before procedure  Malaria/Rheumatoid Hydroxychloroquine (Plaquenil) Stop 11 days before procedure  Thrombolytics  10 days before or after procedures   Over-the-counter (OTC) Products with blood-thinning effects  Product Common names Stop Time  Aspirin > 325 mg Goody Powders, Excedrin, etc. 11 days  Aspirin ? 81 mg  7 days  Fish oil  4 days  Garlic supplements  7 days  Ginkgo biloba  36 hours  Ginseng  24 hours  NSAIDs Ibuprofen, Naprosyn, etc. 3 days  Vitamin E  4 days   ____________________________________________________________________________________________

## 2022-12-15 NOTE — Progress Notes (Signed)
Safety precautions to be maintained throughout the outpatient stay will include: orient to surroundings, keep bed in low position, maintain call bell within reach at all times, provide assistance with transfer out of bed and ambulation.  

## 2022-12-16 ENCOUNTER — Telehealth: Payer: Self-pay

## 2022-12-16 NOTE — Telephone Encounter (Signed)
Post procedure follow up..  Patient states he is doing good 

## 2022-12-26 ENCOUNTER — Telehealth: Payer: Self-pay | Admitting: Pain Medicine

## 2022-12-26 ENCOUNTER — Telehealth: Payer: Self-pay

## 2022-12-26 DIAGNOSIS — N1832 Chronic kidney disease, stage 3b: Secondary | ICD-10-CM | POA: Diagnosis not present

## 2022-12-26 DIAGNOSIS — M62830 Muscle spasm of back: Secondary | ICD-10-CM | POA: Diagnosis not present

## 2022-12-26 NOTE — Telephone Encounter (Signed)
Spoke to patient. STates he had 100 5 relief from LESI until Saturday when he experienced bad back spasms,  They intensified and on Sunday he could barely walk and has been lying in the bed.  States it feels like deep pain. Denies any weakness in legs or problems with bowels or bladder. Instructed patient to go the ED or PCP if he flet like he needed to be seen.  Dr Laban Emperor not in office today.  Patient states  understanding.

## 2022-12-26 NOTE — Telephone Encounter (Signed)
The patient called and said he is having extreme pain. He had procedure on 12/15/22. He said he spoke to someone here and they told him to go to the ER or his PCP.  He is on the way to his PCP.

## 2022-12-26 NOTE — Telephone Encounter (Signed)
Patient states he is in excruiating pain, unable to move or do anything started ;yesterday, please call asap

## 2022-12-26 NOTE — Progress Notes (Unsigned)
PROVIDER NOTE: Information contained herein reflects review and annotations entered in association with encounter. Interpretation of such information and data should be left to medically-trained personnel. Information provided to patient can be located elsewhere in the medical record under "Patient Instructions". Document created using STT-dictation technology, any transcriptional errors that may result from process are unintentional.    Patient: [name] Whitaker  Service Category: E/M  Provider: Oswaldo Done, MD  DOB: 10/12/1950  DOS: 12/27/2022  Referring Provider: [facility]  MRN: [id number]  Specialty: Interventional Pain Management  PCP: [facility]  Type: Established Patient  Setting: Ambulatory outpatient    Location: Office  Delivery: Face-to-face     HPI  [name], a 72 y.o. year old male, is here today because of his Chronic bilateral low back pain without sciatica [M54.50, G89.29]. [name] primary complain today is No chief complaint on file.  Pertinent problems: 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, L4-5, L5-S1); Lumbar facet joint syndrome (Bilateral) (R>L); DDD (degenerative disc disease), lumbosacral; Chronic hip pain (Bilateral); Chronic low back pain (1ry area of Pain) (Bilateral) (R>L) w/o sciatica; Chronic sacroiliac joint pain (Bilateral) (R>L); Other intervertebral disc degeneration, lumbar region; Osteoarthritis of hip (Right); Arthritis of hip (Right); Osteoarthritis of hips (Bilateral); Somatic dysfunction of sacroiliac joints (Bilateral) (R>L); Other spondylosis, sacral and sacrococcygeal region; Acute postoperative  pain; Migraine with aura, not intractable, without status migrainosus; Primary osteoarthritis, left ankle and foot; Osteoarthritis of left foot; Lumbosacral spondylosis without myelopathy; Spinal stenosis of lumbar region; Grade 1 Anterolisthesis (2mm) of lumbar spine of L4/L5; and Statin myopathy on their pertinent problem list. Pain Assessment: Severity of   is reported as a  /10. Location:    / . Onset:  . Quality:  . Timing:  . Modifying factor(s):  Marland Kitchen Vitals:  vitals were not taken for this visit.  BMI: Estimated body mass index is 36.26 kg/m as calculated from the following:   Height as of 12/15/22: 5\' 11"  (1.803 m).   Weight as of 12/15/22: 260 lb (117.9 kg). Last encounter: 11/21/2022. Last procedure: 12/15/2022.  Reason for encounter: post-procedure evaluation and assessment. ***  Post-procedure evaluation   Procedure: Sacroiliac Joint Steroid Injection #3    Laterality: Right     Level: PSIS (Posterior Superior Iliac Spine)  Imaging: Fluoroscopic guidance Anesthesia: Local anesthesia (1-2% Lidocaine) Anxiolysis: IV Versed         Sedation: No Sedation                       DOS: 12/15/2022  Performed by: Oswaldo Done, MD  Purpose: Diagnostic/Therapeutic Indications: Sacroiliac joint pain in the lower back and hip area severe enough to impact quality of life or function. Rationale (medical necessity): procedure needed and proper for the diagnosis and/or treatment of [name] medical symptoms and needs. 1. Chronic sacroiliac joint pain (Bilateral) (R>L)   2. Other spondylosis, sacral and sacrococcygeal region   3. Somatic dysfunction of sacroiliac joints (Bilateral) (R>L)   4. Encounter for therapeutic procedure   5. Chronic anticoagulation (Xarelto)    NAS-11 Pain score:   Pre-procedure: 8 /10   Post-procedure: 8 /10      Effectiveness:  Initial hour after procedure:   ***. Subsequent  4-6 hours post-procedure:   ***. Analgesia past initial 6 hours:    ***. Ongoing improvement:  Analgesic:  *** Function:    ***    ROM:    ***     Pharmacotherapy Assessment  Analgesic: No opioid analgesics prescribed by our practice. Highest recorded MME/day: 26.67 mg/day MME/day: 0 mg/day   Monitoring: [name] Whitaker PMP: PDMP reviewed during this encounter.       Pharmacotherapy: No side-effects or adverse reactions reported. Compliance: No problems identified. Effectiveness: Clinically acceptable.  No notes on file  No results found for: "CBDTHCR" No results found for: "D8THCCBX" No results found for: "D9THCCBX"  UDS:  No results found for: "SUMMARY"    ROS  Constitutional: Denies any fever or chills Gastrointestinal: No reported hemesis, hematochezia, vomiting, or acute GI distress Musculoskeletal: Denies any acute onset joint swelling, redness, loss of ROM, or weakness Neurological: No reported episodes of acute onset apraxia, aphasia, dysarthria, agnosia, amnesia, paralysis, loss of coordination, or loss of consciousness  Medication Review  Cholecalciferol, Cyanocobalamin, Dulaglutide, EPINEPHrine, acetaminophen, allopurinol, ascorbic acid, candesartan, chlorthalidone, cyclobenzaprine, famotidine, fexofenadine, finasteride, fluticasone, gabapentin, hydrocortisone, ketoconazole, lidocaine, potassium chloride SA, propranolol, rivaroxaban, rosuvastatin, and topiramate  History Review  Allergy: [name] is allergic to penicillins, amlodipine, amlodipine besylate, atorvastatin, erenumab-aooe, lisinopril, penicillin g, and simvastatin. Drug: [name]  reports no history of drug use. Alcohol:  reports no history of alcohol use. Tobacco:  reports that he quit smoking about 39 years ago. His smoking use included pipe. He has never used smokeless tobacco. Social: [name]  reports that he quit smoking about 39 years ago. His smoking use included pipe. He has never used smokeless tobacco. He reports that he does not drink alcohol and does not use  drugs. Medical:  has a past medical history of Abnormal ECG (09/27/2013), Chest pain (09/27/2013), Dilated aortic root (HCC) (09/27/2013), DVT, lower extremity (HCC) (10/27/2011), Dyspnea (06/27/2014), GERD (gastroesophageal reflux disease), Gout, History of DVT (deep vein thrombosis) (04/14/2019), History of pulmonary embolism (04/14/2019), HTN (hypertension) (10/25/2011), Hypercholesteremia, Migraine, OSA (obstructive sleep apnea), and Pre-diabetes. Surgical: Mr. Bendolph  has a past surgical history that includes Appendectomy; Nasal septum surgery; Tonsillectomy; Testicle surgery; Cardiac catheterization; Back surgery; Cataract extraction w/PHACO (Right, 10/20/2020); and Cataract extraction w/PHACO (Left, 11/03/2020). Family: family history includes Clotting disorder in his brother; Hypertension in his father and mother.  Laboratory Chemistry Profile   Renal Lab Results  Component Value Date   BUN 6 (L) 07/18/2022   CREATININE 1.21 07/18/2022   BCR 9 (L) 02/25/2021   GFRAA 62 04/15/2019   GFRNONAA >60 07/18/2022    Hepatic Lab Results  Component Value Date   AST 20 07/18/2022   ALT 20 07/18/2022   ALBUMIN 3.7 07/18/2022   ALKPHOS 55 07/18/2022   LIPASE 19 03/15/2007    Electrolytes Lab Results  Component Value Date   NA 140 07/18/2022   K 3.4 (L) 07/18/2022   CL 103 07/18/2022   CALCIUM 9.0 07/18/2022   MG 2.2 04/15/2019    Bone Lab Results  Component Value Date   25OHVITD1 43 04/15/2019   25OHVITD2 <1.0 04/15/2019   25OHVITD3 43 04/15/2019    Inflammation (CRP: Acute Phase) (ESR: Chronic Phase) Lab Results  Component Value Date   CRP 2 04/15/2019   ESRSEDRATE 6 04/15/2019         Note: Above Lab results reviewed.  Recent Imaging Review  DG PAIN CLINIC C-ARM 1-60 MIN NO REPORT Fluoro was used, but no Radiologist interpretation will be provided.  Please refer to "NOTES" tab for provider progress note. Note: Reviewed        Physical Exam  General appearance: Well nourished,  well developed, and well hydrated. In no apparent acute distress Mental status: Alert, oriented x 3 (person, place, & time)       Respiratory: No evidence of acute respiratory distress Eyes: PERLA Vitals: There were no vitals taken for this visit. BMI: Estimated body mass index is 36.26 kg/m as calculated from the following:   Height as of 12/15/22: 5\' 11"  (1.803 m).   Weight as of 12/15/22: 260 lb (117.9 kg). Ideal: Ideal body weight: 75.3 kg (166 lb 0.1 oz) Adjusted ideal body weight: 92.4 kg (203 lb 9.7 oz)  Assessment   Diagnosis Status  1. Chronic low back pain (1ry area of Pain) (Bilateral) (R>L) w/o sciatica   2. Chronic sacroiliac joint pain (Bilateral) (R>L)   3. Somatic dysfunction of sacroiliac joints (Bilateral) (R>L)   4. Lumbar facet joint syndrome (Bilateral) (R>L)   5. Postop check    Controlled Controlled Controlled   Updated Problems: No problems updated.  Plan of Care  Problem-specific:  No problem-specific Assessment & Plan notes found for this encounter.  Mr. Tysheen Laible has a current medication list which includes the following long-term medication(s): allopurinol, candesartan, chlorthalidone, famotidine, fexofenadine, potassium chloride sa, rosuvastatin, and topiramate.  Pharmacotherapy (Medications Ordered): No orders of the defined types were placed in this encounter.  Orders:  No orders of the defined types were placed in this encounter.  Follow-up plan:   No follow-ups on file.      Interventional Therapies  Risk factors  Considerations:   NOTE: Xarelto Anticoagulation (Stop: 3 days  Restart: 6 hours) HTN   Planned  Pending:   Therapeutic left lumbar facet RFA #3    Under consideration:   Possible right opturator + femoral NB  Possible right opturator + femoral nerve RFA    Completed:   Therapeutic right IA hip joint inj. x2 (04/30/2019) (100/100/50/90-100) Diagnostic bilateral SI joint Blk x2 (03/10/2020) (50/50/100 x 5 wks/0)   Diagnostic bilateral lumbar facet MBB x1 (05/07/2020) (100/100/100 x1 week/>75)  Therapeutic left lumbar facet RFA x3 (06/16/2022) (100/100/100/100)  Therapeutic right lumbar facet RFA x3 (05/24/2022) (100/100/80/80) (by 06/16/2022 it had progressed to 100% ongoing relief)   Palliative options:   Palliative right IA hip joint inj.  Palliative bilateral SI joint Blk  Therapeutic/palliative lumbar facet RFA         Recent Visits Date Type Provider Dept  12/15/22 Procedure visit Delano Metz, MD [facility]  11/21/22 Office Visit Delano Metz, MD [facility]  Showing recent visits within past 90 days and meeting all other requirements Future Appointments Date Type Provider Dept  12/27/22 Appointment Delano Metz, MD [facility]  01/10/23 Appointment Delano Metz, MD [facility]  Showing future appointments within next 90 days and meeting all other requirements  I discussed the assessment and treatment plan with the patient. The patient was provided an opportunity to ask questions and all were answered. The patient agreed with the plan and demonstrated an understanding of the instructions.  Patient advised to call back or seek an in-person evaluation if the symptoms or condition worsens.  Duration of encounter: *** minutes.  Total time on encounter, as per AMA guidelines included both the face-to-face and non-face-to-face time personally spent by the physician and/or other qualified health care professional(s) on the day of the encounter (includes time in activities that require  the physician or other qualified health care professional and does not include time in activities normally performed by clinical staff). Physician's time may include the following activities when performed: Preparing to see the patient (e.g., pre-charting review of records, searching for previously ordered imaging, lab work, and nerve conduction  tests) Review of prior analgesic pharmacotherapies. Reviewing PMP Interpreting ordered tests (e.g., lab work, imaging, nerve conduction tests) Performing post-procedure evaluations, including interpretation of diagnostic procedures Obtaining and/or reviewing separately obtained history Performing a medically appropriate examination and/or evaluation Counseling and educating the patient/family/caregiver Ordering medications, tests, or procedures Referring and communicating with other health care professionals (when not separately reported) Documenting clinical information in the electronic or other health record Independently interpreting results (not separately reported) and communicating results to the patient/ family/caregiver Care coordination (not separately reported)  Note by: Oswaldo Done, MD Date: 12/27/2022; Time: 8:12 PM

## 2022-12-27 ENCOUNTER — Ambulatory Visit: Payer: No Typology Code available for payment source | Attending: Pain Medicine | Admitting: Pain Medicine

## 2022-12-27 ENCOUNTER — Encounter: Payer: Self-pay | Admitting: Pain Medicine

## 2022-12-27 VITALS — BP 117/76 | HR 63 | Temp 98.4°F | Ht 71.0 in | Wt 260.0 lb

## 2022-12-27 DIAGNOSIS — M19072 Primary osteoarthritis, left ankle and foot: Secondary | ICD-10-CM | POA: Diagnosis not present

## 2022-12-27 DIAGNOSIS — M47817 Spondylosis without myelopathy or radiculopathy, lumbosacral region: Secondary | ICD-10-CM | POA: Diagnosis not present

## 2022-12-27 DIAGNOSIS — M9904 Segmental and somatic dysfunction of sacral region: Secondary | ICD-10-CM | POA: Diagnosis not present

## 2022-12-27 DIAGNOSIS — G4733 Obstructive sleep apnea (adult) (pediatric): Secondary | ICD-10-CM | POA: Diagnosis not present

## 2022-12-27 DIAGNOSIS — M5137 Other intervertebral disc degeneration, lumbosacral region: Secondary | ICD-10-CM | POA: Diagnosis not present

## 2022-12-27 DIAGNOSIS — M47816 Spondylosis without myelopathy or radiculopathy, lumbar region: Secondary | ICD-10-CM | POA: Insufficient documentation

## 2022-12-27 DIAGNOSIS — Z8249 Family history of ischemic heart disease and other diseases of the circulatory system: Secondary | ICD-10-CM | POA: Diagnosis not present

## 2022-12-27 DIAGNOSIS — I10 Essential (primary) hypertension: Secondary | ICD-10-CM | POA: Diagnosis not present

## 2022-12-27 DIAGNOSIS — Z7901 Long term (current) use of anticoagulants: Secondary | ICD-10-CM | POA: Insufficient documentation

## 2022-12-27 DIAGNOSIS — Z09 Encounter for follow-up examination after completed treatment for conditions other than malignant neoplasm: Secondary | ICD-10-CM

## 2022-12-27 DIAGNOSIS — M109 Gout, unspecified: Secondary | ICD-10-CM | POA: Diagnosis not present

## 2022-12-27 DIAGNOSIS — M545 Low back pain, unspecified: Secondary | ICD-10-CM | POA: Diagnosis not present

## 2022-12-27 DIAGNOSIS — M25551 Pain in right hip: Secondary | ICD-10-CM | POA: Diagnosis not present

## 2022-12-27 DIAGNOSIS — M533 Sacrococcygeal disorders, not elsewhere classified: Secondary | ICD-10-CM | POA: Diagnosis not present

## 2022-12-27 DIAGNOSIS — Z86718 Personal history of other venous thrombosis and embolism: Secondary | ICD-10-CM | POA: Insufficient documentation

## 2022-12-27 DIAGNOSIS — M160 Bilateral primary osteoarthritis of hip: Secondary | ICD-10-CM | POA: Insufficient documentation

## 2022-12-27 DIAGNOSIS — Z87891 Personal history of nicotine dependence: Secondary | ICD-10-CM | POA: Insufficient documentation

## 2022-12-27 DIAGNOSIS — G894 Chronic pain syndrome: Secondary | ICD-10-CM | POA: Insufficient documentation

## 2022-12-27 DIAGNOSIS — Z86711 Personal history of pulmonary embolism: Secondary | ICD-10-CM | POA: Diagnosis not present

## 2022-12-27 DIAGNOSIS — G8929 Other chronic pain: Secondary | ICD-10-CM | POA: Diagnosis present

## 2022-12-27 DIAGNOSIS — M48061 Spinal stenosis, lumbar region without neurogenic claudication: Secondary | ICD-10-CM | POA: Insufficient documentation

## 2022-12-27 DIAGNOSIS — M5459 Other low back pain: Secondary | ICD-10-CM

## 2022-12-27 DIAGNOSIS — M5136 Other intervertebral disc degeneration, lumbar region: Secondary | ICD-10-CM | POA: Diagnosis not present

## 2022-12-27 MED ORDER — METHOCARBAMOL 1000 MG/10ML IJ SOLN
200.0000 mg | Freq: Once | INTRAMUSCULAR | Status: AC
  Administered 2022-12-27: 200 mg via INTRAMUSCULAR
  Filled 2022-12-27 (×2): qty 10

## 2022-12-27 MED ORDER — KETOROLAC TROMETHAMINE 60 MG/2ML IM SOLN
60.0000 mg | Freq: Once | INTRAMUSCULAR | Status: AC
  Administered 2022-12-27: 60 mg via INTRAMUSCULAR
  Filled 2022-12-27: qty 2

## 2022-12-27 MED ORDER — KETOROLAC TROMETHAMINE 60 MG/2ML IM SOLN
INTRAMUSCULAR | Status: AC
  Filled 2022-12-27: qty 2

## 2022-12-27 NOTE — Progress Notes (Unsigned)
Safety precautions to be maintained throughout the outpatient stay will include: orient to surroundings, keep bed in low position, maintain call bell within reach at all times, provide assistance with transfer out of bed and ambulation.  

## 2022-12-27 NOTE — Patient Instructions (Addendum)
OTC Recommendations: Consider taking over-the-counter supplements such as: Turmeric/curcumin*(anti-inflammatory) Glucosamine/chondroitin (triple strength)*(may help prevent loss of articular cartilage) Vitamin D*(may have the ability to suppress release of chemicals associated with inflammation) Moringa*(anti-inflammatory with mild analgesic effects) (*Always use manufacturer's recommended dosage.)   ____________________________________________________________________________________________  Blood Thinners  IMPORTANT NOTICE:  If you take any of these, make sure to notify the nursing staff.  Failure to do so may result in injury.  Recommended time intervals to stop and restart blood-thinners, before & after invasive procedures  Generic Name Brand Name Pre-procedure. Stop this long before procedure. Post-procedure. Minimum waiting period before restarting.  Abciximab Reopro 15 days 2 hrs  Alteplase Activase 10 days 10 days  Anagrelide Agrylin    Apixaban Eliquis 3 days 6 hrs  Cilostazol Pletal 3 days 5 hrs  Clopidogrel Plavix 7-10 days 2 hrs  Dabigatran Pradaxa 5 days 6 hrs  Dalteparin Fragmin 24 hours 4 hrs  Dipyridamole Aggrenox 11days 2 hrs  Edoxaban Lixiana; Savaysa 3 days 2 hrs  Enoxaparin  Lovenox 24 hours 4 hrs  Eptifibatide Integrillin 8 hours 2 hrs  Fondaparinux  Arixtra 72 hours 12 hrs  Hydroxychloroquine Plaquenil 11 days   Prasugrel Effient 7-10 days 6 hrs  Reteplase Retavase 10 days 10 days  Rivaroxaban Xarelto 3 days 6 hrs  Ticagrelor Brilinta 5-7 days 6 hrs  Ticlopidine Ticlid 10-14 days 2 hrs  Tinzaparin Innohep 24 hours 4 hrs  Tirofiban Aggrastat 8 hours 2 hrs  Warfarin Coumadin 5 days 2 hrs   Other medications with blood-thinning effects  Product indications Generic (Brand) names Note  Cholesterol Lipitor Stop 4 days before procedure  Blood thinner (injectable) Heparin (LMW or LMWH Heparin) Stop 24 hours before procedure  Cancer Ibrutinib (Imbruvica)  Stop 7 days before procedure  Malaria/Rheumatoid Hydroxychloroquine (Plaquenil) Stop 11 days before procedure  Thrombolytics  10 days before or after procedures   Over-the-counter (OTC) Products with blood-thinning effects  Product Common names Stop Time  Aspirin > 325 mg Goody Powders, Excedrin, etc. 11 days  Aspirin ? 81 mg  7 days  Fish oil  4 days  Garlic supplements  7 days  Ginkgo biloba  36 hours  Ginseng  24 hours  NSAIDs Ibuprofen, Naprosyn, etc. 3 days  Vitamin E  4 days   ____________________________________________________________________________________________    ______________________________________________________________________  Procedure instructions  Do not eat or drink fluids (other than water) for 6 hours before your procedure  No water for 2 hours before your procedure  Take your blood pressure medicine with a sip of water  Arrive 30 minutes before your appointment  Carefully read the "Preparing for your procedure" detailed instructions  If you have questions call us at [phone]  _____________________________________________________________________    ______________________________________________________________________  Preparing for your procedure  Appointments: If you think you may not be able to keep your appointment, call 24-48 hours in advance to cancel. We need time to make it available to others.  During your procedure appointment there will be: No Prescription Refills. No disability issues to discussed. No medication changes or discussions.  Instructions: Food intake: Avoid eating anything solid for at least 8 hours prior to your procedure. Clear liquid intake: You may take clear liquids such as water up to 2 hours prior to your procedure. (No carbonated drinks. No soda.) Transportation: Unless otherwise stated by your physician, bring a driver. Morning Medicines: Except for blood thinners, take all of your other morning  medications with a sip of water. Make sure to take your  heart and blood pressure medicines. If your blood pressure's lower number is above 100, the case will be rescheduled. Blood thinners: Make sure to stop your blood thinners as instructed.  If you take a blood thinner, but were not instructed to stop it, call our office [phone] and ask to talk to a nurse. Not stopping a blood thinner prior to certain procedures could lead to serious complications. Diabetics on insulin: Notify the staff so that you can be scheduled 1st case in the morning. If your diabetes requires high dose insulin, take only  of your normal insulin dose the morning of the procedure and notify the staff that you have done so. Preventing infections: Shower with an antibacterial soap the morning of your procedure.  Build-up your immune system: Take 1000 mg of Vitamin C with every meal (3 times a day) the day prior to your procedure. Antibiotics: Inform the nursing staff if you are taking any antibiotics or if you have any conditions that may require antibiotics prior to procedures. (Example: recent joint implants)   Pregnancy: If you are pregnant make sure to notify the nursing staff. Not doing so may result in injury to the fetus, including death.  Sickness: If you have a cold, fever, or any active infections, call and cancel or reschedule your procedure. Receiving steroids while having an infection may result in complications. Arrival: You must be in the facility at least 30 minutes prior to your scheduled procedure. Tardiness: Your scheduled time is also the cutoff time. If you do not arrive at least 15 minutes prior to your procedure, you will be rescheduled.  Children: Do not bring any children with you. Make arrangements to keep them home. Dress appropriately: There is always a possibility that your clothing may get soiled. Avoid long dresses. Valuables: Do not bring any jewelry or valuables.  Reasons to call and  reschedule or cancel your procedure: (Following these recommendations will minimize the risk of a serious complication.) Surgeries: Avoid having procedures within 2 weeks of any surgery. (Avoid for 2 weeks before or after any surgery). Flu Shots: Avoid having procedures within 2 weeks of a flu shots or . (Avoid for 2 weeks before or after immunizations). Barium: Avoid having a procedure within 7-10 days after having had a radiological study involving the use of radiological contrast. (Myelograms, Barium swallow or enema study). Heart attacks: Avoid any elective procedures or surgeries for the initial 6 months after a "Myocardial Infarction" (Heart Attack). Blood thinners: It is imperative that you stop these medications before procedures. Let us know if you if you take any blood thinner.  Infection: Avoid procedures during or within two weeks of an infection (including chest colds or gastrointestinal problems). Symptoms associated with infections include: Localized redness, fever, chills, night sweats or profuse sweating, burning sensation when voiding, cough, congestion, stuffiness, runny nose, sore throat, diarrhea, nausea, vomiting, cold or Flu symptoms, recent or current infections. It is specially important if the infection is over the area that we intend to treat. Heart and lung problems: Symptoms that may suggest an active cardiopulmonary problem include: cough, chest pain, breathing difficulties or shortness of breath, dizziness, ankle swelling, uncontrolled high or unusually low blood pressure, and/or palpitations. If you are experiencing any of these symptoms, cancel your procedure and contact your primary care physician for an evaluation.  Remember:  Regular Business hours are:  Monday to Thursday 8:00 AM to 4:00 PM  Provider's Schedule: Delano Metz, MD:  Procedure days: Tuesday and Thursday 7:30  AM to 4:00 PM  Edward Jolly, MD:  Procedure days: Monday and Wednesday 7:30 AM to 4:00  PM  ______________________________________________________________________    ____________________________________________________________________________________________  General Risks and Possible Complications  Patient Responsibilities: It is important that you read this as it is part of your informed consent. It is our duty to inform you of the risks and possible complications associated with treatments offered to you. It is your responsibility as a patient to read this and to ask questions about anything that is not clear or that you believe was not covered in this document.  Patient's Rights: You have the right to refuse treatment. You also have the right to change your mind, even after initially having agreed to have the treatment done. However, under this last option, if you wait until the last second to change your mind, you may be charged for the materials used up to that point.  Introduction: Medicine is not an Visual merchandiser. Everything in Medicine, including the lack of treatment(s), carries the potential for danger, harm, or loss (which is by definition: Risk). In Medicine, a complication is a secondary problem, condition, or disease that can aggravate an already existing one. All treatments carry the risk of possible complications. The fact that a side effects or complications occurs, does not imply that the treatment was conducted incorrectly. It must be clearly understood that these can happen even when everything is done following the highest safety standards.  No treatment: You can choose not to proceed with the proposed treatment alternative. The "PRO(s)" would include: avoiding the risk of complications associated with the therapy. The "CON(s)" would include: not getting any of the treatment benefits. These benefits fall under one of three categories: diagnostic; therapeutic; and/or palliative. Diagnostic benefits include: getting information which can ultimately lead to  improvement of the disease or symptom(s). Therapeutic benefits are those associated with the successful treatment of the disease. Finally, palliative benefits are those related to the decrease of the primary symptoms, without necessarily curing the condition (example: decreasing the pain from a flare-up of a chronic condition, such as incurable terminal cancer).  General Risks and Complications: These are associated to most interventional treatments. They can occur alone, or in combination. They fall under one of the following six (6) categories: no benefit or worsening of symptoms; bleeding; infection; nerve damage; allergic reactions; and/or death. No benefits or worsening of symptoms: In Medicine there are no guarantees, only probabilities. No healthcare provider can ever guarantee that a medical treatment will work, they can only state the probability that it may. Furthermore, there is always the possibility that the condition may worsen, either directly, or indirectly, as a consequence of the treatment. Bleeding: This is more common if the patient is taking a blood thinner, either prescription or over the counter (example: Goody Powders, Fish oil, Aspirin, Garlic, etc.), or if suffering a condition associated with impaired coagulation (example: Hemophilia, cirrhosis of the liver, low platelet counts, etc.). However, even if you do not have one on these, it can still happen. If you have any of these conditions, or take one of these drugs, make sure to notify your treating physician. Infection: This is more common in patients with a compromised immune system, either due to disease (example: diabetes, cancer, human immunodeficiency virus [HIV], etc.), or due to medications or treatments (example: therapies used to treat cancer and rheumatological diseases). However, even if you do not have one on these, it can still happen. If you have any of these conditions,  or take one of these drugs, make sure to notify  your treating physician. Nerve Damage: This is more common when the treatment is an invasive one, but it can also happen with the use of medications, such as those used in the treatment of cancer. The damage can occur to small secondary nerves, or to large primary ones, such as those in the spinal cord and brain. This damage may be temporary or permanent and it may lead to impairments that can range from temporary numbness to permanent paralysis and/or brain death. Allergic Reactions: Any time a substance or material comes in contact with our body, there is the possibility of an allergic reaction. These can range from a mild skin rash (contact dermatitis) to a severe systemic reaction (anaphylactic reaction), which can result in death. Death: In general, any medical intervention can result in death, most of the time due to an unforeseen complication. ____________________________________________________________________________________________

## 2023-01-04 ENCOUNTER — Telehealth: Payer: Self-pay

## 2023-01-04 NOTE — Telephone Encounter (Signed)
He wants to come in for another toredol IM injection. He said the pain has returned. Please advise

## 2023-01-04 NOTE — Telephone Encounter (Signed)
Please call and schedule him for a nurse visit for Toradol/Robaxin injection. thanks

## 2023-01-04 NOTE — Telephone Encounter (Signed)
Will discuss with Dr Naveira 

## 2023-01-05 ENCOUNTER — Ambulatory Visit
Payer: No Typology Code available for payment source | Attending: Student in an Organized Health Care Education/Training Program | Admitting: Student in an Organized Health Care Education/Training Program

## 2023-01-05 ENCOUNTER — Encounter: Payer: Self-pay | Admitting: Student in an Organized Health Care Education/Training Program

## 2023-01-05 VITALS — BP 133/87 | HR 71 | Temp 97.5°F | Resp 16 | Ht 70.5 in | Wt 255.0 lb

## 2023-01-05 DIAGNOSIS — M47819 Spondylosis without myelopathy or radiculopathy, site unspecified: Secondary | ICD-10-CM | POA: Diagnosis not present

## 2023-01-05 DIAGNOSIS — G8929 Other chronic pain: Secondary | ICD-10-CM | POA: Diagnosis present

## 2023-01-05 DIAGNOSIS — M545 Low back pain, unspecified: Secondary | ICD-10-CM | POA: Diagnosis present

## 2023-01-05 DIAGNOSIS — M47816 Spondylosis without myelopathy or radiculopathy, lumbar region: Secondary | ICD-10-CM | POA: Diagnosis not present

## 2023-01-05 DIAGNOSIS — M25551 Pain in right hip: Secondary | ICD-10-CM | POA: Insufficient documentation

## 2023-01-05 DIAGNOSIS — M533 Sacrococcygeal disorders, not elsewhere classified: Secondary | ICD-10-CM | POA: Insufficient documentation

## 2023-01-05 MED ORDER — KETOROLAC TROMETHAMINE 30 MG/ML IJ SOLN
30.0000 mg | Freq: Once | INTRAMUSCULAR | Status: AC
  Administered 2023-01-05: 30 mg via INTRAMUSCULAR

## 2023-01-05 MED ORDER — METHOCARBAMOL 1000 MG/10ML IJ SOLN
INTRAMUSCULAR | Status: AC
  Filled 2023-01-05: qty 10

## 2023-01-05 MED ORDER — METHOCARBAMOL 1000 MG/10ML IJ SOLN
200.0000 mg | Freq: Once | INTRAMUSCULAR | Status: AC
  Administered 2023-01-05: 200 mg via INTRAMUSCULAR

## 2023-01-05 MED ORDER — KETOROLAC TROMETHAMINE 30 MG/ML IJ SOLN
INTRAMUSCULAR | Status: AC
  Filled 2023-01-05: qty 1

## 2023-01-05 NOTE — Progress Notes (Signed)
Safety precautions to be maintained throughout the outpatient stay will include: orient to surroundings, keep bed in low position, maintain call bell within reach at all times, provide assistance with transfer out of bed and ambulation.  

## 2023-01-05 NOTE — Progress Notes (Signed)
PROVIDER NOTE: Information contained herein reflects review and annotations entered in association with encounter. Interpretation of such information and data should be left to medically-trained personnel. Information provided to patient can be located elsewhere in the medical record under "Patient Instructions". Document created using STT-dictation technology, any transcriptional errors that may result from process are unintentional.    Patient: [name] Whitaker  Service Category: E/M  Provider: Edward Jolly, MD  DOB: [birth year]  DOS: 01/05/2023  Referring Provider: [facility]  MRN: [id number]  Specialty: Interventional Pain Management  PCP: [facility]  Type: Established Patient  Setting: Ambulatory outpatient    Location: Office  Delivery: Face-to-face     HPI  [name], a 72 y.o. year old male, is here today because of his Chronic bilateral low back pain without sciatica [M54.50, G89.29]. Mr. Hawthorne's primary complain today is Back Pain (Lumbar right is worse )   Pain Assessment: Severity of Chronic pain is reported as a 8 /10. Location: Back Lower, Right/into right hip and down right leg. Onset: More than a month ago. Quality: Discomfort, Constant, Sharp, Throbbing, Radiating. Timing: Constant. Modifying factor(s): rest. Vitals:  height is 5' 10.5" (1.791 m) and weight is 255 lb (115.7 kg). His temporal temperature is 97.5 F (36.4 C) (abnormal). His blood pressure is 133/87 and his pulse is 71. His respiration is 16 and oxygen saturation is 99%.  BMI: Estimated body mass index is 36.07 kg/m as calculated from the following:   Height as of this encounter: 5' 10.5" (1.791 m).   Weight as of this encounter: 255 lb (115.7 kg). Last encounter: Visit date not found. Last procedure: Visit date not found.  Reason for encounter:  IM Toradol and Robaxin .  Assessment   Diagnosis Status  1. Chronic low back pain (1ry area of Pain) (Bilateral) (R>L) w/o  sciatica   2. Chronic sacroiliac joint pain (Bilateral) (R>L)   3. Lumbar facet joint syndrome (Bilateral) (R>L)   4. Chronic hip pain (Right)    Having a Flare-up Having a Flare-up Having a Flare-up     Plan of Care   Mr. Channin Lennartz has a current medication list which includes the following long-term medication(s): allopurinol, candesartan, chlorthalidone, famotidine, fexofenadine, potassium chloride sa, rosuvastatin, and topiramate.  Acute on chronic pain exacerbation.  Here for intramuscular Norflex and Toradol for increased low back pain with radiation into right hip and right leg.  She has an upcoming appointment with Dr. Annia Belt on March 19, 2023.  Pharmacotherapy (Medications Ordered): Meds ordered this encounter  Medications   ketorolac (TORADOL) 30 MG/ML injection 30 mg   methocarbamol (ROBAXIN) injection 200 mg    Follow-up plan:   No follow-ups on file.      Recent Visits Date Type Provider Dept  12/27/22 Office Visit Delano Metz, MD [facility]  12/15/22 Procedure visit Delano Metz, MD [facility]  11/21/22 Office Visit Delano Metz, MD [facility]  Showing recent visits within past 90 days and meeting all other requirements Today's Visits Date Type Provider Dept  01/05/23 Procedure visit Edward Jolly, MD [facility]  Showing today's visits and meeting all other requirements Future Appointments Date Type Provider Dept  01/17/23 Appointment Delano Metz, MD [facility]  Showing future appointments within next 90 days and meeting all other requirements  I discussed the assessment and treatment plan with the patient. The patient was provided an opportunity to ask questions and all were answered. The patient  agreed with the plan and demonstrated an understanding of the instructions.  Patient advised to call back or seek an in-person evaluation if the symptoms or condition  worsens.  Duration of encounter: .  Total time on encounter, as per AMA guidelines included both the face-to-face and non-face-to-face time personally spent by the physician and/or other qualified health care professional(s) on the day of the encounter (includes time in activities that require the physician or other qualified health care professional and does not include time in activities normally performed by clinical staff). Physician's time may include the following activities when performed: Preparing to see the patient (e.g., pre-charting review of records, searching for previously ordered imaging, lab work, and nerve conduction tests) Review of prior analgesic pharmacotherapies. Reviewing PMP Interpreting ordered tests (e.g., lab work, imaging, nerve conduction tests) Performing post-procedure evaluations, including interpretation of diagnostic procedures Obtaining and/or reviewing separately obtained history Performing a medically appropriate examination and/or evaluation Counseling and educating the patient/family/caregiver Ordering medications, tests, or procedures Referring and communicating with other health care professionals (when not separately reported) Documenting clinical information in the electronic or other health record Independently interpreting results (not separately reported) and communicating results to the patient/ family/caregiver Care coordination (not separately reported)  Note by: Edward Jolly, MD Date: 01/05/2023; Time: 11:26 AM

## 2023-01-10 ENCOUNTER — Ambulatory Visit: Payer: No Typology Code available for payment source | Admitting: Pain Medicine

## 2023-01-11 DIAGNOSIS — D352 Benign neoplasm of pituitary gland: Secondary | ICD-10-CM | POA: Diagnosis not present

## 2023-01-12 DIAGNOSIS — N1832 Chronic kidney disease, stage 3b: Secondary | ICD-10-CM | POA: Diagnosis not present

## 2023-01-12 DIAGNOSIS — D352 Benign neoplasm of pituitary gland: Secondary | ICD-10-CM | POA: Diagnosis not present

## 2023-01-12 DIAGNOSIS — Z7901 Long term (current) use of anticoagulants: Secondary | ICD-10-CM | POA: Diagnosis not present

## 2023-01-12 DIAGNOSIS — I10 Essential (primary) hypertension: Secondary | ICD-10-CM | POA: Diagnosis not present

## 2023-01-12 DIAGNOSIS — Z0000 Encounter for general adult medical examination without abnormal findings: Secondary | ICD-10-CM | POA: Diagnosis not present

## 2023-01-12 DIAGNOSIS — M1A0790 Idiopathic chronic gout, unspecified ankle and foot, without tophus (tophi): Secondary | ICD-10-CM | POA: Diagnosis not present

## 2023-01-12 DIAGNOSIS — E1122 Type 2 diabetes mellitus with diabetic chronic kidney disease: Secondary | ICD-10-CM | POA: Diagnosis not present

## 2023-01-12 DIAGNOSIS — I77810 Thoracic aortic ectasia: Secondary | ICD-10-CM | POA: Diagnosis not present

## 2023-01-12 DIAGNOSIS — E7800 Pure hypercholesterolemia, unspecified: Secondary | ICD-10-CM | POA: Diagnosis not present

## 2023-01-12 DIAGNOSIS — D6869 Other thrombophilia: Secondary | ICD-10-CM | POA: Diagnosis not present

## 2023-01-16 NOTE — Progress Notes (Unsigned)
PROVIDER NOTE: Interpretation of information contained herein should be left to medically-trained personnel. Specific patient instructions are provided elsewhere under "Patient Instructions" section of medical record. This document was created in part using STT-dictation technology, any transcriptional errors that may result from this process are unintentional.  Patient: [name] Whitaker Type: Established DOB: September 09, 1950 MRN: [id number] PCP: [facility]  Service: Procedure DOS: 01/17/2023 Setting: Ambulatory Location: Ambulatory outpatient facility Delivery: Face-to-face Provider: Oswaldo Done, MD Specialty: Interventional Pain Management Specialty designation: 09 Location: Outpatient facility Ref. Prov.: [facility]*       Interventional Therapy   Procedure No.1: Lumbar Facet, Medial Branch Block(s) #3  Laterality: Right  Level: L2, L3, L4, L5, and S1 Medial Branch Level(s). Injecting these levels blocks the L3-4, L4-5, and L5-S1 lumbar facet joints. Imaging: Fluoroscopic guidance         Anesthesia: Local anesthesia (1-2% Lidocaine) Anxiolysis: IV Versed         Sedation: Moderate Sedation                       DOS: 01/17/2023 Performed by: Oswaldo Done, MD  Primary Purpose: Diagnostic/Therapeutic Indications: Low back pain severe enough to impact quality of life or function. 1. Chronic low back pain (1ry area of Pain) (Bilateral) (R>L) w/o sciatica   2. Grade 1 Anterolisthesis (2mm) of lumbar spine of L4/L5   3. Lumbar facet joint pain   4. Lumbar facet joint syndrome (Bilateral) (R>L)   5. Spondylosis of lumbar region without myelopathy or radiculopathy   6. Lumbar facet hypertrophy (Multilevel)    7. DDD (degenerative disc disease), lumbosacral   8. Chronic anticoagulation (Xarelto)    Procedure No.2: Intra-articular hip injection  #3  Laterality: Right (-RT)  Approach: Percutaneous posterolateral approach. Level: Lower pelvic and hip  joint level.  Imaging: Fluoroscopy-guided         Anesthesia/Analgesia: see above. Target: Intra-articular hip joint Region: Hip joint, upper (proximal) femoral region Type of procedure: Percutaneous joint injection  Purpose: Therapeutic Indications: Hip pain severe enough to impact quality of life or function. Rationale (medical necessity): procedure needed and proper for the diagnosis and/or treatment of [name] Whitaker's medical symptoms and needs. 1. Chronic hip pain (Right)   2. Osteoarthritis of hip (Right)   3. Chronic anticoagulation (Xarelto)     NAS-11 Pain score:   Pre-procedure: 9 /10   Post-procedure: 0-No pain/10     Position / Prep / Materials:  Position: Prone  Prep solution: DuraPrep (Iodine Povacrylex [0.7% available iodine] and Isopropyl Alcohol, 74% w/w) Area Prepped: Posterolateral Lumbosacral Spine (Wide prep: From the lower border of the scapula down to the end of the tailbone and from flank to flank.)  Materials for procedure #1:  Tray: Block Needle(s):  Type: Spinal  Gauge (G): 22  Length: 3.5-in Qty: 4     Materials for procedure #2:  Tray: Block tray Needle(s):  Type: Spinal  Gauge (G): 22  Length: 7-in  Qty: 1  H&P (Pre-op Assessment):  [name] Whitaker is a 72 y.o. (year old), male patient, seen today for interventional treatment. He  has a past surgical history that includes Appendectomy; Nasal septum surgery; Tonsillectomy; Testicle surgery; Cardiac catheterization; Back surgery; Cataract extraction w/PHACO (Right, 10/20/2020); and Cataract extraction w/PHACO (Left, 11/03/2020). [name] Whitaker has a current medication list which includes the following prescription(s): acetaminophen, allopurinol, ascorbic acid, candesartan, chlorthalidone, cholecalciferol, cyanocobalamin, cyclobenzaprine, dulaglutide, epinephrine, famotidine, fexofenadine, finasteride, fluticasone, gabapentin, hydrocortisone, ketoconazole, lidocaine, potassium chloride  sa, propranolol,  rivaroxaban, rosuvastatin, and topiramate, and the following Facility-Administered Medications: fentanyl. His primarily concern today is the Back Pain  Initial Vital Signs:  Pulse/HCG Rate: 90ECG Heart Rate: 86 (NSR) Temp: 97.9 F (36.6 C) Resp: 18 BP: (!) 139/95 SpO2: 99 %  BMI: Estimated body mass index is 35.7 kg/m as calculated from the following:   Height as of this encounter: 5\' 11"  (1.803 m).   Weight as of this encounter: 256 lb (116.1 kg).  Risk Assessment: Allergies: Reviewed. He is allergic to penicillins, amlodipine, amlodipine besylate, atorvastatin, erenumab-aooe, lisinopril, penicillin g, and simvastatin.  Allergy Precautions: None required Coagulopathies: Reviewed. None identified.  Blood-thinner therapy: None at this time Active Infection(s): Reviewed. None identified. [name] Whitaker is afebrile  Site Confirmation: [name] Whitaker was asked to confirm the procedure and laterality before marking the site Procedure checklist: Completed Consent: Before the procedure and under the influence of no sedative(s), amnesic(s), or anxiolytics, the patient was informed of the treatment options, risks and possible complications. To fulfill our ethical and legal obligations, as recommended by the American Medical Association's Code of Ethics, I have informed the patient of my clinical impression; the nature and purpose of the treatment or procedure; the risks, benefits, and possible complications of the intervention; the alternatives, including doing nothing; the risk(s) and benefit(s) of the alternative treatment(s) or procedure(s); and the risk(s) and benefit(s) of doing nothing. The patient was provided information about the general risks and possible complications associated with the procedure. These may include, but are not limited to: failure to achieve desired goals, infection, bleeding, organ or nerve damage, allergic reactions, paralysis, and death. In addition, the patient was  informed of those risks and complications associated to Spine-related procedures, such as failure to decrease pain; infection (i.e.: Meningitis, epidural or intraspinal abscess); bleeding (i.e.: epidural hematoma, subarachnoid hemorrhage, or any other type of intraspinal or peri-dural bleeding); organ or nerve damage (i.e.: Any type of peripheral nerve, nerve root, or spinal cord injury) with subsequent damage to sensory, motor, and/or autonomic systems, resulting in permanent pain, numbness, and/or weakness of one or several areas of the body; allergic reactions; (i.e.: anaphylactic reaction); and/or death. Furthermore, the patient was informed of those risks and complications associated with the medications. These include, but are not limited to: allergic reactions (i.e.: anaphylactic or anaphylactoid reaction(s)); adrenal axis suppression; blood sugar elevation that in diabetics may result in ketoacidosis or comma; water retention that in patients with history of congestive heart failure may result in shortness of breath, pulmonary edema, and decompensation with resultant heart failure; weight gain; swelling or edema; medication-induced neural toxicity; particulate matter embolism and blood vessel occlusion with resultant organ, and/or nervous system infarction; and/or aseptic necrosis of one or more joints. Finally, the patient was informed that Medicine is not an exact science; therefore, there is also the possibility of unforeseen or unpredictable risks and/or possible complications that may result in a catastrophic outcome. The patient indicated having understood very clearly. We have given the patient no guarantees and we have made no promises. Enough time was given to the patient to ask questions, all of which were answered to the patient's satisfaction. Mr. Lieb has indicated that he wanted to continue with the procedure. Attestation: I, the ordering provider, attest that I have discussed with the  patient the benefits, risks, side-effects, alternatives, likelihood of achieving goals, and potential problems during recovery for the procedure that I have provided informed consent. Date  Time: 01/17/2023  8:40 AM   Pre-Procedure  Preparation:  Monitoring: As per clinic protocol. Respiration, ETCO2, SpO2, BP, heart rate and rhythm monitor placed and checked for adequate function Safety Precautions: Patient was assessed for positional comfort and pressure points before starting the procedure. Time-out: I initiated and conducted the "Time-out" before starting the procedure, as per protocol. The patient was asked to participate by confirming the accuracy of the "Time Out" information. Verification of the correct person, site, and procedure were performed and confirmed by me, the nursing staff, and the patient. "Time-out" conducted as per Joint Commission's Universal Protocol (UP.01.01.01). Time: 1059 Start Time: 1059 hrs.  Description of Procedure #1:  Laterality: (see above) Targeted Levels: (see above)  Safety Precautions: Aspiration looking for blood return was conducted prior to all injections. At no point did we inject any substances, as a needle was being advanced. Before injecting, the patient was told to immediately notify me if he was experiencing any new onset of "ringing in the ears, or metallic taste in the mouth". No attempts were made at seeking any paresthesias. Safe injection practices and needle disposal techniques used. Medications properly checked for expiration dates. SDV (single dose vial) medications used. After the completion of the procedure, all disposable equipment used was discarded in the proper designated medical waste containers. Local Anesthesia: Protocol guidelines were followed. The patient was positioned over the fluoroscopy table. The area was prepped in the usual manner. The time-out was completed. The target area was identified using fluoroscopy. A 12-in long,  straight, sterile hemostat was used with fluoroscopic guidance to locate the targets for each level blocked. Once located, the skin was marked with an approved surgical skin marker. Once all sites were marked, the skin (epidermis, dermis, and hypodermis), as well as deeper tissues (fat, connective tissue and muscle) were infiltrated with a small amount of a short-acting local anesthetic, loaded on a 10cc syringe with a 25G, 1.5-in  Needle. An appropriate amount of time was allowed for local anesthetics to take effect before proceeding to the next step. Local Anesthetic: Lidocaine 2.0% The unused portion of the local anesthetic was discarded in the proper designated containers. Technical description of process:  L2 Medial Branch Nerve Block (MBB): The target area for the L2 medial branch is at the junction of the postero-lateral aspect of the superior articular process and the superior, posterior, and medial edge of the transverse process of L3. Under fluoroscopic guidance, a Quincke needle was inserted until contact was made with os over the superior postero-lateral aspect of the pedicular shadow (target area). After negative aspiration for blood, 0.5 mL of the nerve block solution was injected without difficulty or complication. The needle was removed intact. L3 Medial Branch Nerve Block (MBB): The target area for the L3 medial branch is at the junction of the postero-lateral aspect of the superior articular process and the superior, posterior, and medial edge of the transverse process of L4. Under fluoroscopic guidance, a Quincke needle was inserted until contact was made with os over the superior postero-lateral aspect of the pedicular shadow (target area). After negative aspiration for blood, 0.5 mL of the nerve block solution was injected without difficulty or complication. The needle was removed intact. L4 Medial Branch Nerve Block (MBB): The target area for the L4 medial branch is at the junction of the  postero-lateral aspect of the superior articular process and the superior, posterior, and medial edge of the transverse process of L5. Under fluoroscopic guidance, a Quincke needle was inserted until contact was made with os over the  superior postero-lateral aspect of the pedicular shadow (target area). After negative aspiration for blood, 0.5 mL of the nerve block solution was injected without difficulty or complication. The needle was removed intact. L5 Medial Branch Nerve Block (MBB): The target area for the L5 medial branch is at the junction of the postero-lateral aspect of the superior articular process and the superior, posterior, and medial edge of the sacral ala. Under fluoroscopic guidance, a Quincke needle was inserted until contact was made with os over the superior postero-lateral aspect of the pedicular shadow (target area). After negative aspiration for blood, 0.5 mL of the nerve block solution was injected without difficulty or complication. The needle was removed intact. S1 Medial Branch Nerve Block (MBB): The target area for the S1 medial branch is at the posterior and inferior 6 o'clock position of the L5-S1 facet joint. Under fluoroscopic guidance, the Quincke needle inserted for the L5 MBB was redirected until contact was made with os over the inferior and postero aspect of the sacrum, at the 6 o' clock position under the L5-S1 facet joint (Target area). After negative aspiration for blood, 0.5 mL of the nerve block solution was injected without difficulty or complication. The needle was removed intact.  Once the entire procedure was completed, the treated area was cleaned, making sure to leave some of the prepping solution back to take advantage of its long term bactericidal properties.         Illustration of the posterior view of the lumbar spine and the posterior neural structures. Laminae of L2 through S1 are labeled. DPRL5, dorsal primary ramus of L5; DPRS1, dorsal primary ramus  of S1; DPR3, dorsal primary ramus of L3; FJ, facet (zygapophyseal) joint L3-L4; I, inferior articular process of L4; LB1, lateral branch of dorsal primary ramus of L1; IAB, inferior articular branches from L3 medial branch (supplies L4-L5 facet joint); IBP, intermediate branch plexus; MB3, medial branch of dorsal primary ramus of L3; NR3, third lumbar nerve root; S, superior articular process of L5; SAB, superior articular branches from L4 (supplies L4-5 facet joint also); TP3, transverse process of L3.   Facet Joint Innervation (* possible contribution)  L1-2 T12, L1 (L2*)  Medial Branch  L2-3 L1, L2 (L3*)         "          "  L3-4 L2, L3 (L4*)         "          "  L4-5 L3, L4 (L5*)         "          "  L5-S1 L4, L5, S1          "          "    Description/Narrative of Procedure #2:  Rationale (medical necessity): procedure needed and proper for the diagnosis and/or treatment of the patient's medical symptoms and needs. Procedural Technique Safety Precautions: Aspiration looking for blood return was conducted prior to all injections. At no point did we inject any substances, as a needle was being advanced. No attempts were made at seeking any paresthesias. Safe injection practices and needle disposal techniques used. Medications properly checked for expiration dates. SDV (single dose vial) medications used. Description of the Procedure: Protocol guidelines were followed. The patient was assisted into a comfortable position. The target area was identified and the area prepped in the usual manner. Skin & deeper tissues infiltrated with local anesthetic. Appropriate amount of time allowed  to pass for local anesthetics to take effect. The procedure needles were then advanced to the target area. Proper needle placement secured. Negative aspiration confirmed. Solution injected in intermittent fashion, asking for systemic symptoms every 0.5cc of injectate. The needles were then removed and the area  cleansed, making sure to leave some of the prepping solution back to take advantage of its long term bactericidal properties.  Technical description of procedure:  Skin & deeper tissues infiltrated with local anesthetic. Appropriate amount of time allowed to pass for local anesthetics to take effect. The procedure needles were then advanced to the target area. Proper needle placement secured. Negative aspiration confirmed. Solution injected in intermittent fashion, asking for systemic symptoms every 0.5cc of injectate. The needles were then removed and the area cleansed, making sure to leave some of the prepping solution back to take advantage of its long term bactericidal properties.       Vitals:   01/17/23 1109 01/17/23 1119 01/17/23 1129 01/17/23 1139  BP: (!) 131/103 (!) 136/95 (!) 137/95 134/84  Pulse:      Resp: 16 17 11 18   Temp:      TempSrc:      SpO2: 99% 99% 96% 97%  Weight:      Height:         End Time: 1109 hrs.  Imaging Guidance (Spinal) for procedure #1:  Type of Imaging Technique: Fluoroscopy Guidance (Spinal) Indication(s): Assistance in needle guidance and placement for procedures requiring needle placement in or near specific anatomical locations not easily accessible without such assistance. Exposure Time: Please see nurses notes. Contrast: None used. Fluoroscopic Guidance: I was personally present during the use of fluoroscopy. "Tunnel Vision Technique" used to obtain the best possible view of the target area. Parallax error corrected before commencing the procedure. "Direction-depth-direction" technique used to introduce the needle under continuous pulsed fluoroscopy. Once target was reached, antero-posterior, oblique, and lateral fluoroscopic projection used confirm needle placement in all planes. Images permanently stored in EMR. Interpretation: No contrast injected. I personally interpreted the imaging intraoperatively. Adequate needle placement confirmed in  multiple planes. Permanent images saved into the patient's record.  Imaging Guidance (Non-Spinal) for procedure #2:  Type of Imaging Technique: Fluoroscopy Guidance (Non-Spinal) Indication(s): Assistance in needle guidance and placement for procedures requiring needle placement in or near specific anatomical locations not easily accessible without such assistance. Exposure Time: Please see nurses notes. Contrast: Before injecting any contrast, we confirmed that the patient did not have an allergy to iodine, shellfish, or radiological contrast. Once satisfactory needle placement was completed at the desired level, radiological contrast was injected. Contrast injected under live fluoroscopy. No contrast complications. See chart for type and volume of contrast used. Fluoroscopic Guidance: I was personally present during the use of fluoroscopy. "Tunnel Vision Technique" used to obtain the best possible view of the target area. Parallax error corrected before commencing the procedure. "Direction-depth-direction" technique used to introduce the needle under continuous pulsed fluoroscopy. Once target was reached, antero-posterior, oblique, and lateral fluoroscopic projection used confirm needle placement in all planes. Images permanently stored in EMR. Interpretation: I personally interpreted the imaging intraoperatively. Adequate needle placement confirmed in multiple planes. Appropriate spread of contrast into desired area was observed. No evidence of afferent or efferent intravascular uptake. Permanent images saved into the patient's record.  Post-operative Assessment:  Post-procedure Vital Signs:  Pulse/HCG Rate: 9076 Temp: 97.9 F (36.6 C) Resp: 18 BP: 134/84 SpO2: 97 %  EBL: None  Complications: No immediate post-treatment complications observed by  team, or reported by patient.  Note: The patient tolerated the entire procedure well. A repeat set of vitals were taken after the procedure and the  patient was kept under observation following institutional policy, for this type of procedure. Post-procedural neurological assessment was performed, showing return to baseline, prior to discharge. The patient was provided with post-procedure discharge instructions, including a section on how to identify potential problems. Should any problems arise concerning this procedure, the patient was given instructions to immediately contact us, at any time, without hesitation. In any case, we plan to contact the patient by telephone for a follow-up status report regarding this interventional procedure.  Comments:  No additional relevant information.  Plan of Care (POC)  Orders:  Orders Placed This Encounter  Procedures   LUMBAR FACET(MEDIAL BRANCH NERVE BLOCK) MBNB    Scheduling Instructions:     Procedure: Lumbar facet block (AKA.: Lumbosacral medial branch nerve block)     Side: Right-sided     Level: L3-4, L4-5, L5-S1, and TBD Facets (L2, L3, L4, L5, S1, and TBD Medial Branch Nerves)     Sedation: Patient's choice.     Timeframe: Today    Order Specific Question:   Where will this procedure be performed?    Answer:   [facility]   HIP INJECTION    Scheduling Instructions:     Side: Right-sided     Sedation: Patient's choice.     Timeframe: Today   DG PAIN [facility] C-ARM 1-60 MIN NO REPORT    Intraoperative interpretation by procedural physician at [facility].    Standing Status:   Standing    Number of Occurrences:   1    Order Specific Question:   Reason for exam:    Answer:   Assistance in needle guidance and placement for procedures requiring needle placement in or near specific anatomical locations not easily accessible without such assistance.   Informed Consent Details: Physician/Practitioner Attestation; Transcribe to consent form and obtain patient signature    Nursing Order: Transcribe to consent form and obtain patient signature. Note: Always confirm laterality  of pain with Mr. Tiongco, before procedure.    Order Specific Question:   Physician/Practitioner attestation of informed consent for procedure/surgical case    Answer:   I, the physician/practitioner, attest that I have discussed with the patient the benefits, risks, side effects, alternatives, likelihood of achieving goals and potential problems during recovery for the procedure that I have provided informed consent.    Order Specific Question:   Procedure    Answer:   Lumbar Facet Block  under fluoroscopic guidance    Order Specific Question:   Physician/Practitioner performing the procedure    Answer:   Cataleah Stites A. Laban Emperor MD    Order Specific Question:   Indication/Reason    Answer:   Low Back Pain, with our without leg pain, due to Facet Joint Arthralgia (Joint Pain) Spondylosis (Arthritis of the Spine), without myelopathy or radiculopathy (Nerve Damage).   Care order/instruction: Please confirm that the patient has stopped the Xarelto (Rivaroxaban) x 3 days prior to procedure or surgery.    Please confirm that the patient has stopped the Xarelto (Rivaroxaban) x 3 days prior to procedure or surgery.    Standing Status:   Standing    Number of Occurrences:   1   Provide equipment / supplies at bedside    Procedure tray: "Block Tray" (Disposable  single use) Skin infiltration needle: Regular 1.5-in, 25-G, (x1) Block Needle type: Spinal  Amount/quantity: 4 Size: Regular (3.5-inch) Gauge: 22G    Standing Status:   Standing    Number of Occurrences:   1    Order Specific Question:   Specify    Answer:   Block Tray   Informed Consent Details: Physician/Practitioner Attestation; Transcribe to consent form and obtain patient signature    Nursing Order: Transcribe to consent form and obtain patient signature. Note: Always confirm laterality of pain with Mr. Morelan, before procedure.    Order Specific Question:   Physician/Practitioner attestation of informed consent for procedure/surgical  case    Answer:   I, the physician/practitioner, attest that I have discussed with the patient the benefits, risks, side effects, alternatives, likelihood of achieving goals and potential problems during recovery for the procedure that I have provided informed consent.    Order Specific Question:   Procedure    Answer:   Hip injection    Order Specific Question:   Physician/Practitioner performing the procedure    Answer:   Lerae Langham A. Laban Emperor, MD    Order Specific Question:   Indication/Reason    Answer:   Hip Joint Pain (Arthralgia)   Provide equipment / supplies at bedside    Procedure tray: "Block Tray" (Disposable  single use) Skin infiltration needle: Regular 1.5-in, 25-G, (x1) Block Needle type: Spinal Amount/quantity: 1 Size: Long (7-inch) Gauge: 22G    Standing Status:   Standing    Number of Occurrences:   1    Order Specific Question:   Specify    Answer:   Block Tray   Bleeding precautions    Standing Status:   Standing    Number of Occurrences:   1   Chronic Opioid Analgesic:  No opioid analgesics prescribed by our practice. Highest recorded MME/day: 26.67 mg/day MME/day: 0 mg/day   Medications ordered for procedure: Meds ordered this encounter  Medications   lidocaine (XYLOCAINE) 2 % (with pres) injection 400 mg   pentafluoroprop-tetrafluoroeth (GEBAUERS) aerosol   lactated ringers infusion   midazolam (VERSED) 5 MG/5ML injection 0.5-2 mg    Make sure Flumazenil is available in the pyxis when using this medication. If oversedation occurs, administer 0.2 mg IV over 15 sec. If after 45 sec no response, administer 0.2 mg again over 1 min; may repeat at 1 min intervals; not to exceed 4 doses (1 mg)   fentaNYL (SUBLIMAZE) injection 25-50 mcg    Make sure Narcan is available in the pyxis when using this medication. In the event of respiratory depression (RR< 8/min): Titrate NARCAN (naloxone) in increments of 0.1 to 0.2 mg IV at 2-3 minute intervals, until desired degree  of reversal.   ropivacaine (PF) 2 mg/mL (0.2%) (NAROPIN) injection 9 mL   triamcinolone acetonide (KENALOG-40) injection 40 mg   ropivacaine (PF) 2 mg/mL (0.2%) (NAROPIN) injection 9 mL   methylPREDNISolone acetate (DEPO-MEDROL) injection 80 mg   Medications administered: We administered lidocaine, pentafluoroprop-tetrafluoroeth, lactated ringers, midazolam, fentaNYL, ropivacaine (PF) 2 mg/mL (0.2%), triamcinolone acetonide, ropivacaine (PF) 2 mg/mL (0.2%), and methylPREDNISolone acetate.  See the medical record for exact dosing, route, and time of administration.  Follow-up plan:   Return in about 2 weeks (around 01/31/2023) for (Face2F), (PPE).       Interventional Therapies  Risk factors  Considerations:   NOTE: Xarelto Anticoagulation (Stop: 3 days  Restart: 6 hours) HTN   Planned  Pending:   Therapeutic left lumbar facet RFA #3    Under consideration:   Possible right opturator + femoral NB  Possible  right opturator + femoral nerve RFA    Completed:    Therapeutic right IA hip joint inj. x2 (04/30/2019) (100/100/50/90-100) Diagnostic bilateral SI joint Blk x2 (03/10/2020) (50/50/100 x 5 wks/0)  Diagnostic/therapeutic right SI joint Blk x3 (12/15/2022) (100/100/0/0)  Diagnostic bilateral lumbar facet MBB x1 (05/07/2020) (100/100/100 x1 week/>75)  Therapeutic left lumbar facet RFA x3 (06/16/2022) (100/100/100/100)  Therapeutic right lumbar facet RFA x3 (05/24/2022) (100/100/80/80) (by 06/16/2022 it had progressed to 100% ongoing relief)   Palliative options:   Palliative right IA hip joint inj.  Palliative bilateral SI joint Blk  Therapeutic/palliative lumbar facet RFA       Recent Visits Date Type Provider Dept  01/05/23 Procedure visit Edward Jolly, MD [facility]  12/27/22 Office Visit Delano Metz, MD [facility]  12/15/22 Procedure visit Delano Metz, MD [facility]  11/21/22 Office Visit Delano Metz, MD [facility]  Showing recent visits within past 90 days and meeting all other requirements Today's Visits Date Type Provider Dept  01/17/23 Procedure visit Delano Metz, MD [facility]  Showing today's visits and meeting all other requirements Future Appointments Date Type Provider Dept  02/07/23 Appointment Delano Metz, MD [facility]  Showing future appointments within next 90 days and meeting all other requirements  Disposition: Discharge home  Discharge (Date  Time): 01/17/2023; 1140 hrs.   Primary Care Physician: [facility] Location: [facility] Note by: Oswaldo Done, MD (TTS technology used. I apologize for any typographical errors that were not detected and corrected.) Date: 01/17/2023; Time: 12:26 PM  Disclaimer:  Medicine is not an Visual merchandiser. The only guarantee in medicine is that nothing is guaranteed. It is important to note that the decision to proceed with this intervention was based on the information collected from the patient. The Data and conclusions were drawn from the patient's questionnaire, the interview, and the physical examination. Because the information was provided in large part by the patient, it cannot be guaranteed that it has not been purposely or unconsciously manipulated. Every effort has been made to obtain as much relevant data as possible for this evaluation. It is important to note that the conclusions that lead to this procedure are derived in large part from the available data. Always take into account that the treatment will also be dependent on availability of resources and existing treatment guidelines, considered by other Pain Management Practitioners as being common knowledge and practice, at the time of the intervention. For Medico-Legal purposes, it is also important to point out that variation in procedural techniques and pharmacological choices are the acceptable  norm. The indications, contraindications, technique, and results of the above procedure should only be interpreted and judged by a Board-Certified Interventional Pain Specialist with extensive familiarity and expertise in the same exact procedure and technique.

## 2023-01-17 ENCOUNTER — Encounter: Payer: Self-pay | Admitting: Pain Medicine

## 2023-01-17 ENCOUNTER — Ambulatory Visit: Payer: No Typology Code available for payment source | Attending: Pain Medicine | Admitting: Pain Medicine

## 2023-01-17 ENCOUNTER — Ambulatory Visit
Admission: RE | Admit: 2023-01-17 | Discharge: 2023-01-17 | Disposition: A | Discharge status: Home / Self Care | Payer: No Typology Code available for payment source | Source: Ambulatory Visit | Attending: Pain Medicine | Admitting: Pain Medicine

## 2023-01-17 VITALS — BP 134/84 | HR 90 | Temp 97.9°F | Resp 18 | Ht 71.0 in | Wt 256.0 lb

## 2023-01-17 DIAGNOSIS — M25551 Pain in right hip: Secondary | ICD-10-CM | POA: Insufficient documentation

## 2023-01-17 DIAGNOSIS — M47816 Spondylosis without myelopathy or radiculopathy, lumbar region: Secondary | ICD-10-CM | POA: Diagnosis not present

## 2023-01-17 DIAGNOSIS — M5459 Other low back pain: Secondary | ICD-10-CM | POA: Diagnosis not present

## 2023-01-17 DIAGNOSIS — M4316 Spondylolisthesis, lumbar region: Secondary | ICD-10-CM

## 2023-01-17 DIAGNOSIS — M545 Low back pain, unspecified: Secondary | ICD-10-CM | POA: Diagnosis present

## 2023-01-17 DIAGNOSIS — Z7901 Long term (current) use of anticoagulants: Secondary | ICD-10-CM | POA: Diagnosis not present

## 2023-01-17 DIAGNOSIS — M1611 Unilateral primary osteoarthritis, right hip: Secondary | ICD-10-CM

## 2023-01-17 DIAGNOSIS — G8929 Other chronic pain: Secondary | ICD-10-CM | POA: Diagnosis present

## 2023-01-17 DIAGNOSIS — M5137 Other intervertebral disc degeneration, lumbosacral region: Secondary | ICD-10-CM | POA: Insufficient documentation

## 2023-01-17 DIAGNOSIS — Z7985 Long-term (current) use of injectable non-insulin antidiabetic drugs: Secondary | ICD-10-CM | POA: Diagnosis not present

## 2023-01-17 DIAGNOSIS — M51379 Other intervertebral disc degeneration, lumbosacral region without mention of lumbar back pain or lower extremity pain: Secondary | ICD-10-CM

## 2023-01-17 DIAGNOSIS — Z01818 Encounter for other preprocedural examination: Secondary | ICD-10-CM | POA: Insufficient documentation

## 2023-01-17 MED ORDER — ROPIVACAINE HCL 2 MG/ML IJ SOLN
9.0000 mL | Freq: Once | INTRAMUSCULAR | Status: AC
  Administered 2023-01-17: 9 mL via PERINEURAL

## 2023-01-17 MED ORDER — LACTATED RINGERS IV SOLN: Freq: Once | INTRAVENOUS | Status: AC

## 2023-01-17 MED ORDER — MIDAZOLAM HCL 5 MG/5ML IJ SOLN
0.5000 mg | Freq: Once | INTRAMUSCULAR | Status: AC
  Administered 2023-01-17: 1 mg via INTRAVENOUS

## 2023-01-17 MED ORDER — ROPIVACAINE HCL 2 MG/ML IJ SOLN
9.0000 mL | Freq: Once | INTRAMUSCULAR | Status: AC
  Administered 2023-01-17: 9 mL via INTRA_ARTICULAR

## 2023-01-17 MED ORDER — TRIAMCINOLONE ACETONIDE 40 MG/ML IJ SUSP
INTRAMUSCULAR | Status: AC
  Filled 2023-01-17: qty 1

## 2023-01-17 MED ORDER — IOHEXOL 180 MG/ML  SOLN
INTRAMUSCULAR | Status: AC
  Filled 2023-01-17: qty 10

## 2023-01-17 MED ORDER — TRIAMCINOLONE ACETONIDE 40 MG/ML IJ SUSP
40.0000 mg | Freq: Once | INTRAMUSCULAR | Status: AC
  Administered 2023-01-17: 40 mg

## 2023-01-17 MED ORDER — MIDAZOLAM HCL 5 MG/5ML IJ SOLN
INTRAMUSCULAR | Status: AC
  Filled 2023-01-17: qty 5

## 2023-01-17 MED ORDER — METHYLPREDNISOLONE ACETATE 80 MG/ML IJ SUSP
INTRAMUSCULAR | Status: AC
  Filled 2023-01-17: qty 1

## 2023-01-17 MED ORDER — ROPIVACAINE HCL 2 MG/ML IJ SOLN
INTRAMUSCULAR | Status: AC
  Filled 2023-01-17: qty 20

## 2023-01-17 MED ORDER — METHYLPREDNISOLONE ACETATE 80 MG/ML IJ SUSP
80.0000 mg | Freq: Once | INTRAMUSCULAR | Status: AC
  Administered 2023-01-17: 80 mg via INTRA_ARTICULAR

## 2023-01-17 MED ORDER — LIDOCAINE HCL 2 % IJ SOLN
INTRAMUSCULAR | Status: AC
  Filled 2023-01-17: qty 20

## 2023-01-17 MED ORDER — FENTANYL CITRATE (PF) 100 MCG/2ML IJ SOLN
25.0000 ug | INTRAMUSCULAR | Status: DC | PRN
  Administered 2023-01-17: 50 ug via INTRAVENOUS

## 2023-01-17 MED ORDER — PENTAFLUOROPROP-TETRAFLUOROETH EX AERO
INHALATION_SPRAY | Freq: Once | CUTANEOUS | Status: AC
  Administered 2023-01-17: 30 via TOPICAL
  Filled 2023-01-17: qty 30

## 2023-01-17 MED ORDER — LIDOCAINE HCL 2 % IJ SOLN
20.0000 mL | Freq: Once | INTRAMUSCULAR | Status: AC
  Administered 2023-01-17: 400 mg

## 2023-01-17 MED ORDER — FENTANYL CITRATE (PF) 100 MCG/2ML IJ SOLN
INTRAMUSCULAR | Status: AC
  Filled 2023-01-17: qty 2

## 2023-01-17 NOTE — Patient Instructions (Signed)

## 2023-01-18 ENCOUNTER — Telehealth: Payer: Self-pay

## 2023-01-18 NOTE — Telephone Encounter (Signed)
Post procedure follow up.  Patient states he had a rough night.States that his pain came back around 9pm.  Informed him that the numbing med had worn off and now he needed to wait for the steroid to kick in.  Informed him tp put heat on his hip today and to call us for any further questions or concerns but to give it some time.

## 2023-02-02 DIAGNOSIS — N1831 Chronic kidney disease, stage 3a: Secondary | ICD-10-CM | POA: Diagnosis not present

## 2023-02-02 DIAGNOSIS — I10 Essential (primary) hypertension: Secondary | ICD-10-CM | POA: Diagnosis not present

## 2023-02-06 NOTE — Progress Notes (Unsigned)
PROVIDER NOTE: Information contained herein reflects review and annotations entered in association with encounter. Interpretation of such information and data should be left to medically-trained personnel. Information provided to patient can be located elsewhere in the medical record under "Patient Instructions". Document created using STT-dictation technology, any transcriptional errors that may result from process are unintentional.    Patient: [name] Whitaker  Service Category: E/M  Provider: Oswaldo Done, MD  DOB: 03/06/1951  DOS: 02/07/2023  Referring Provider: [facility]  MRN: [id number]  Specialty: Interventional Pain Management  PCP: [facility]  Type: Established Patient  Setting: Ambulatory outpatient    Location: Office  Delivery: Face-to-face     HPI  [name] Whitaker, a 72 y.o. year old male, is here today because of his Chronic bilateral low back pain without sciatica [M54.50, G89.29]. [name] Whitaker's primary complain today is No chief complaint on file.  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, L4-5, L5-S1); Lumbar facet joint syndrome (Bilateral) (R>L); DDD (degenerative disc disease), lumbosacral; Chronic hip pain (Bilateral); Chronic low back pain (1ry area of Pain) (Bilateral) (R>L) w/o sciatica; Chronic sacroiliac joint pain (Bilateral) (R>L); Other intervertebral disc degeneration, lumbar region; Osteoarthritis of hip (Right); Arthritis of hip (Right); Osteoarthritis of hips (Bilateral); Somatic dysfunction of sacroiliac joints (Bilateral) (R>L); Other spondylosis, sacral and sacrococcygeal region; Acute postoperative  pain; Migraine with aura, not intractable, without status migrainosus; Primary osteoarthritis, left ankle and foot; Osteoarthritis of left foot; Lumbosacral spondylosis without myelopathy; Spinal stenosis of lumbar region; Grade 1 Anterolisthesis (2mm) of lumbar spine of L4/L5; Statin myopathy; Chronic hip pain (Right); and Lumbar facet joint pain on their pertinent problem list. Pain Assessment: Severity of   is reported as a  /10. Location:    / . Onset:  . Quality:  . Timing:  . Modifying factor(s):  Marland Kitchen Vitals:  vitals were not taken for this visit.  BMI: Estimated body mass index is 35.7 kg/m as calculated from the following:   Height as of 01/17/23: 5\' 11"  (1.803 m).   Weight as of 01/17/23: 256 lb (116.1 kg). Last encounter: 12/27/2022. Last procedure: 01/17/2023.  Reason for encounter: post-procedure evaluation and assessment. ***  Post-procedure evaluation   Procedure No.1: Lumbar Facet, Medial Branch Block(s) #3  Laterality: Right  Level: L2, L3, L4, L5, and S1 Medial Branch Level(s). Injecting these levels blocks the L3-4, L4-5, and L5-S1 lumbar facet joints. Imaging: Fluoroscopic guidance         Anesthesia: Local anesthesia (1-2% Lidocaine) Anxiolysis: IV Versed         Sedation: Moderate Sedation                       DOS: 01/17/2023 Performed by: Oswaldo Done, MD  Primary Purpose: Diagnostic/Therapeutic Indications: Low back pain severe enough to impact quality of life or function. 1. Chronic low back pain (1ry area of Pain) (Bilateral) (R>L) w/o sciatica   2. Grade 1 Anterolisthesis (2mm) of lumbar spine of L4/L5   3. Lumbar facet joint pain   4. Lumbar facet joint syndrome (Bilateral) (R>L)   5. Spondylosis of lumbar region without myelopathy or radiculopathy   6. Lumbar facet hypertrophy (Multilevel)    7. DDD (degenerative disc disease), lumbosacral  8. Chronic anticoagulation (Xarelto)    Procedure No.2: Intra-articular hip injection  #3  Laterality: Right  (-RT)  Approach: Percutaneous posterolateral approach. Level: Lower pelvic and hip joint level.  Imaging: Fluoroscopy-guided         Anesthesia/Analgesia: see above. Target: Intra-articular hip joint Region: Hip joint, upper (proximal) femoral region Type of procedure: Percutaneous joint injection  Purpose: Therapeutic Indications: Hip pain severe enough to impact quality of life or function. Rationale (medical necessity): procedure needed and proper for the diagnosis and/or treatment of [name] Whitaker medical symptoms and needs. 1. Chronic hip pain (Right)   2. Osteoarthritis of hip (Right)   3. Chronic anticoagulation (Xarelto)     NAS-11 Pain score:   Pre-procedure: 9 /10   Post-procedure: 0-No pain/10      Effectiveness:  Initial hour after procedure:   ***. Subsequent 4-6 hours post-procedure:   ***. Analgesia past initial 6 hours:   ***. Ongoing improvement:  Analgesic:  *** Function:    ***    ROM:    ***     Pharmacotherapy Assessment  Analgesic: No opioid analgesics prescribed by our practice. Highest recorded MME/day: 26.67 mg/day MME/day: 0 mg/day   Monitoring: [place] PMP: PDMP reviewed during this encounter.       Pharmacotherapy: No side-effects or adverse reactions reported. Compliance: No problems identified. Effectiveness: Clinically acceptable.  No notes on file  No results found for: "CBDTHCR" No results found for: "D8THCCBX" No results found for: "D9THCCBX"  UDS:  No results found for: "SUMMARY"    ROS  Constitutional: Denies any fever or chills Gastrointestinal: No reported hemesis, hematochezia, vomiting, or acute GI distress Musculoskeletal: Denies any acute onset joint swelling, redness, loss of ROM, or weakness Neurological: No reported episodes of acute onset apraxia, aphasia, dysarthria, agnosia, amnesia, paralysis, loss of coordination, or loss of consciousness  Medication Review  Cholecalciferol, Cyanocobalamin, Dulaglutide, EPINEPHrine,  acetaminophen, allopurinol, ascorbic acid, candesartan, chlorthalidone, cyclobenzaprine, famotidine, fexofenadine, finasteride, fluticasone, gabapentin, hydrocortisone, ketoconazole, lidocaine, potassium chloride SA, propranolol, rivaroxaban, rosuvastatin, and topiramate  History Review  Allergy: [name] Whitaker is allergic to penicillins, amlodipine, amlodipine besylate, atorvastatin, erenumab-aooe, lisinopril, penicillin g, and simvastatin. Drug: [name] Whitaker  reports no history of drug use. Alcohol:  reports no history of alcohol use. Tobacco:  reports that he quit smoking about 39 years ago. His smoking use included pipe. He has never used smokeless tobacco. Social: [name] Whitaker  reports that he quit smoking about 39 years ago. His smoking use included pipe. He has never used smokeless tobacco. He reports that he does not drink alcohol and does not use drugs. Medical:  has a past medical history of Abnormal ECG (09/27/2013), Chest pain (09/27/2013), Dilated aortic root (HCC) (09/27/2013), DVT, lower extremity (HCC) (10/27/2011), Dyspnea (06/27/2014), GERD (gastroesophageal reflux disease), Gout, History of DVT (deep vein thrombosis) (04/14/2019), History of pulmonary embolism (04/14/2019), HTN (hypertension) (10/25/2011), Hypercholesteremia, Migraine, OSA (obstructive sleep apnea), and Pre-diabetes. Surgical: [name] Whitaker  has a past surgical history that includes Appendectomy; Nasal septum surgery; Tonsillectomy; Testicle surgery; Cardiac catheterization; Back surgery; Cataract extraction w/PHACO (Right, 10/20/2020); and Cataract extraction w/PHACO (Left, 11/03/2020). Family: family history includes Clotting disorder in his brother; Hypertension in his father and mother.  Laboratory Chemistry Profile   Renal Lab Results  Component Value Date   BUN 6 (L) 07/18/2022   CREATININE 1.21 07/18/2022   BCR 9 (L) 02/25/2021   GFRAA 62 04/15/2019   GFRNONAA >60 07/18/2022    Hepatic Lab Results  Component Value  Date  AST 20 07/18/2022   ALT 20 07/18/2022   ALBUMIN 3.7 07/18/2022   ALKPHOS 55 07/18/2022   LIPASE 19 03/15/2007    Electrolytes Lab Results  Component Value Date   NA 140 07/18/2022   K 3.4 (L) 07/18/2022   CL 103 07/18/2022   CALCIUM 9.0 07/18/2022   MG 2.2 04/15/2019    Bone Lab Results  Component Value Date   25OHVITD1 43 04/15/2019   25OHVITD2 <1.0 04/15/2019   25OHVITD3 43 04/15/2019    Inflammation (CRP: Acute Phase) (ESR: Chronic Phase) Lab Results  Component Value Date   CRP 2 04/15/2019   ESRSEDRATE 6 04/15/2019         Note: Above Lab results reviewed.  Recent Imaging Review  DG PAIN CLIN[facility] C-ARM 1-60 MIN NO REPORT Fluoro was used, but no Radiologist interpretation will be provided.  Please refer to "NOTES" tab for provider progress note. Note: Reviewed        Physical Exam  General appearance: Well nourished, well developed, and well hydrated. In no apparent acute distress Mental status: Alert, oriented x 3 (person, place, & time)       Respiratory: No evidence of acute respiratory distress Eyes: PERLA Vitals: There were no vitals taken for this visit. BMI: Estimated body mass index is 35.7 kg/m as calculated from the following:   Height as of 01/17/23: 5\' 11"  (1.803 m).   Weight as of 01/17/23: 256 lb (116.1 kg). Ideal: Patient weight not recorded  Assessment   Diagnosis Status  1. Chronic low back pain (1ry area of Pain) (Bilateral) (R>L) w/o sciatica   2. Lumbar facet joint pain   3. Lumbar facet joint syndrome (Bilateral) (R>L)   4. Postop check    Controlled Controlled Controlled   Updated Problems: No problems updated.  Plan of Care  Problem-specific:  No problem-specific Assessment & Plan notes found for this encounter.  [name] Whitaker has a current medication list which includes the following long-term medication(s): allopurinol, candesartan, chlorthalidone, famotidine, fexofenadine, potassium chloride sa, rosuvastatin,  and topiramate.  Pharmacotherapy (Medications Ordered): No orders of the defined types were placed in this encounter.  Orders:  No orders of the defined types were placed in this encounter.  Follow-up plan:   No follow-ups on file.      Interventional Therapies  Risk factors  Considerations:   NOTE: Xarelto Anticoagulation (Stop: 3 days  Restart: 6 hours) HTN   Planned  Pending:   Therapeutic left lumbar facet RFA #3    Under consideration:   Possible right opturator + femoral NB  Possible right opturator + femoral nerve RFA    Completed:    Therapeutic right IA hip joint inj. x2 (04/30/2019) (100/100/50/90-100) Diagnostic bilateral SI joint Blk x2 (03/10/2020) (50/50/100 x 5 wks/0)  Diagnostic/therapeutic right SI joint Blk x3 (12/15/2022) (100/100/0/0)  Diagnostic bilateral lumbar facet MBB x1 (05/07/2020) (100/100/100 x1 week/>75)  Therapeutic left lumbar facet RFA x3 (06/16/2022) (100/100/100/100)  Therapeutic right lumbar facet RFA x3 (05/24/2022) (100/100/80/80) (by 06/16/2022 it had progressed to 100% ongoing relief)   Palliative options:   Palliative right IA hip joint inj.  Palliative bilateral SI joint Blk  Therapeutic/palliative lumbar facet RFA        Recent Visits Date Type Provider Dept  01/17/23 Procedure visit Delano Metz, MD [facility]  01/05/23 Procedure visit Edward Jolly, MD [facility]  12/27/22 Office Visit Delano Metz, MD [facility]  12/15/22 Procedure visit Delano Metz, MD [facility]  11/21/22 Office Visit Delano Metz, MD [facility]  Showing recent visits within past 90 days and meeting all other requirements Future Appointments Date Type Provider Dept  02/07/23 Appointment Delano Metz, MD [facility]  Showing future appointments within next 90 days and meeting all other requirements  I discussed the assessment and treatment plan with the  patient. The patient was provided an opportunity to ask questions and all were answered. The patient agreed with the plan and demonstrated an understanding of the instructions.  Patient advised to call back or seek an in-person evaluation if the symptoms or condition worsens.  Duration of encounter: *** minutes.  Total time on encounter, as per AMA guidelines included both the face-to-face and non-face-to-face time personally spent by the physician and/or other qualified health care professional(s) on the day of the encounter (includes time in activities that require the physician or other qualified health care professional and does not include time in activities normally performed by clinical staff). Physician's time may include the following activities when performed: Preparing to see the patient (e.g., pre-charting review of records, searching for previously ordered imaging, lab work, and nerve conduction tests) Review of prior analgesic pharmacotherapies. Reviewing PMP Interpreting ordered tests (e.g., lab work, imaging, nerve conduction tests) Performing post-procedure evaluations, including interpretation of diagnostic procedures Obtaining and/or reviewing separately obtained history Performing a medically appropriate examination and/or evaluation Counseling and educating the patient/family/caregiver Ordering medications, tests, or procedures Referring and communicating with other health care professionals (when not separately reported) Documenting clinical information in the electronic or other health record Independently interpreting results (not separately reported) and communicating results to the patient/ family/caregiver Care coordination (not separately reported)  Note by: Oswaldo Done, MD Date: 02/07/2023; Time: 5:24 AM

## 2023-02-07 ENCOUNTER — Encounter: Payer: Self-pay | Admitting: Pain Medicine

## 2023-02-07 ENCOUNTER — Ambulatory Visit: Payer: No Typology Code available for payment source | Attending: Pain Medicine | Admitting: Pain Medicine

## 2023-02-07 VITALS — BP 127/89 | HR 99 | Temp 98.0°F | Resp 16 | Ht 70.0 in | Wt 259.0 lb

## 2023-02-07 DIAGNOSIS — Z7901 Long term (current) use of anticoagulants: Secondary | ICD-10-CM

## 2023-02-07 DIAGNOSIS — M160 Bilateral primary osteoarthritis of hip: Secondary | ICD-10-CM | POA: Insufficient documentation

## 2023-02-07 DIAGNOSIS — M545 Low back pain, unspecified: Secondary | ICD-10-CM | POA: Insufficient documentation

## 2023-02-07 DIAGNOSIS — M48061 Spinal stenosis, lumbar region without neurogenic claudication: Secondary | ICD-10-CM | POA: Diagnosis not present

## 2023-02-07 DIAGNOSIS — M25551 Pain in right hip: Secondary | ICD-10-CM | POA: Diagnosis not present

## 2023-02-07 DIAGNOSIS — M47816 Spondylosis without myelopathy or radiculopathy, lumbar region: Secondary | ICD-10-CM | POA: Diagnosis not present

## 2023-02-07 DIAGNOSIS — R930 Abnormal findings on diagnostic imaging of skull and head, not elsewhere classified: Secondary | ICD-10-CM | POA: Diagnosis not present

## 2023-02-07 DIAGNOSIS — M79661 Pain in right lower leg: Secondary | ICD-10-CM | POA: Diagnosis not present

## 2023-02-07 DIAGNOSIS — M79662 Pain in left lower leg: Secondary | ICD-10-CM | POA: Insufficient documentation

## 2023-02-07 DIAGNOSIS — M19072 Primary osteoarthritis, left ankle and foot: Secondary | ICD-10-CM | POA: Diagnosis not present

## 2023-02-07 DIAGNOSIS — M5137 Other intervertebral disc degeneration, lumbosacral region: Secondary | ICD-10-CM | POA: Diagnosis not present

## 2023-02-07 DIAGNOSIS — G8918 Other acute postprocedural pain: Secondary | ICD-10-CM | POA: Diagnosis not present

## 2023-02-07 DIAGNOSIS — M5459 Other low back pain: Secondary | ICD-10-CM | POA: Diagnosis not present

## 2023-02-07 DIAGNOSIS — M109 Gout, unspecified: Secondary | ICD-10-CM | POA: Diagnosis not present

## 2023-02-07 DIAGNOSIS — M4316 Spondylolisthesis, lumbar region: Secondary | ICD-10-CM | POA: Insufficient documentation

## 2023-02-07 DIAGNOSIS — G8929 Other chronic pain: Secondary | ICD-10-CM | POA: Diagnosis present

## 2023-02-07 DIAGNOSIS — M533 Sacrococcygeal disorders, not elsewhere classified: Secondary | ICD-10-CM

## 2023-02-07 DIAGNOSIS — M5136 Other intervertebral disc degeneration, lumbar region: Secondary | ICD-10-CM | POA: Insufficient documentation

## 2023-02-07 DIAGNOSIS — M9904 Segmental and somatic dysfunction of sacral region: Secondary | ICD-10-CM

## 2023-02-07 DIAGNOSIS — Z09 Encounter for follow-up examination after completed treatment for conditions other than malignant neoplasm: Secondary | ICD-10-CM | POA: Insufficient documentation

## 2023-02-07 DIAGNOSIS — M47817 Spondylosis without myelopathy or radiculopathy, lumbosacral region: Secondary | ICD-10-CM | POA: Insufficient documentation

## 2023-02-07 NOTE — Patient Instructions (Addendum)
Sacroiliac (SI) Joint Injection Patient Information  Description: The sacroiliac joint connects the scrum (very low back and tailbone) to the ilium (a pelvic bone which also forms half of the hip joint).  Normally this joint experiences very little motion.  When this joint becomes inflamed or unstable low back and or hip and pelvis pain may result.  Injection of this joint with local anesthetics (numbing medicines) and steroids can provide diagnostic information and reduce pain.  This injection is performed with the aid of x-ray guidance into the tailbone area while you are lying on your stomach.   You may experience an electrical sensation down the leg while this is being done.  You may also experience numbness.  We also may ask if we are reproducing your normal pain during the injection.  Conditions which may be treated SI injection:  Low back, buttock, hip or leg pain  Preparation for the Injection:  Do not eat any solid food or dairy products within 8 hours of your appointment.  You may drink clear liquids up to 3 hours before appointment.  Clear liquids include water, black coffee, juice or soda.  No milk or cream please. You may take your regular medications, including pain medications with a sip of water before your appointment.  Diabetics should hold regular insulin (if take separately) and take 1/2 normal NPH dose the morning of the procedure.  Carry some sugar containing items with you to your appointment. A driver must accompany you and be prepared to drive you home after your procedure. Bring all of your current medications with you. An IV may be inserted and sedation may be given at the discretion of the physician. A blood pressure cuff, EKG and other monitors will often be applied during the procedure.  Some patients may need to have extra oxygen administered for a short period.  You will be asked to provide medical information, including your allergies, prior to the procedure.  We  must know immediately if you are taking blood thinners (like Coumadin/Warfarin) or if you are allergic to IV iodine contrast (dye).  We must know if you could possible be pregnant.  Possible side effects:  Bleeding from needle site Infection (rare, may require surgery) Nerve injury (rare) Numbness & tingling (temporary) A brief convulsion or seizure Light-headedness (temporary) Pain at injection site (several days) Decreased blood pressure (temporary) Weakness in the leg (temporary)   Call if you experience:  New onset weakness or numbness of an extremity below the injection site that last more than 8 hours. Hives or difficulty breathing ( go to the emergency room) Inflammation or drainage at the injection site Any new symptoms which are concerning to you  Please note:  Although the local anesthetic injected can often make your back/ hip/ buttock/ leg feel good for several hours after the injections, the pain will likely return.  It takes 3-7 days for steroids to work in the sacroiliac area.  You may not notice any pain relief for at least that one week.  If effective, we will often do a series of three injections spaced 3-6 weeks apart to maximally decrease your pain.  After the initial series, we generally will wait some months before a repeat injection of the same type.  If you have any questions, please call [phone]  [facility]   ______________________________________________________________________    Procedure instructions  Stop blood-thinners  Do not eat or drink fluids (other than water) for 6 hours before your  procedure  No water for 2 hours before your procedure  Take your blood pressure medicine with a sip of water  Arrive 30 minutes before your appointment  If sedation is planned, bring suitable driver. Pennie Banter, Benedetto Goad, & public transportation are NOT APPROVED)  Carefully read the "Preparing for your procedure" detailed  instructions  If you have questions call us at [phone]  ______________________________________________________________________      ______________________________________________________________________    Preparing for your procedure  Appointments: If you think you may not be able to keep your appointment, call 24-48 hours in advance to cancel. We need time to make it available to others.  During your procedure appointment there will be: No Prescription Refills. No disability issues to discussed. No medication changes or discussions.  Instructions: Food intake: Avoid eating anything solid for at least 8 hours prior to your procedure. Clear liquid intake: You may take clear liquids such as water up to 2 hours prior to your procedure. (No carbonated drinks. No soda.) Transportation: Unless otherwise stated by your physician, bring a driver. (Driver cannot be a Market researcher, Pharmacist, community, or any other form of public transportation.) Morning Medicines: Except for blood thinners, take all of your other morning medications with a sip of water. Make sure to take your heart and blood pressure medicines. If your blood pressure's lower number is above 100, the case will be rescheduled. Blood thinners: Make sure to stop your blood thinners as instructed.  If you take a blood thinner, but were not instructed to stop it, call our office [phone] and ask to talk to a nurse. Not stopping a blood thinner prior to certain procedures could lead to serious complications. Diabetics on insulin: Notify the staff so that you can be scheduled 1st case in the morning. If your diabetes requires high dose insulin, take only  of your normal insulin dose the morning of the procedure and notify the staff that you have done so. Preventing infections: Shower with an antibacterial soap the morning of your procedure.  Build-up your immune system: Take 1000 mg of Vitamin C with every meal (3 times a day) the day prior to  your procedure. Antibiotics: Inform the nursing staff if you are taking any antibiotics or if you have any conditions that may require antibiotics prior to procedures. (Example: recent joint implants)   Pregnancy: If you are pregnant make sure to notify the nursing staff. Not doing so may result in injury to the fetus, including death.  Sickness: If you have a cold, fever, or any active infections, call and cancel or reschedule your procedure. Receiving steroids while having an infection may result in complications. Arrival: You must be in the facility at least 30 minutes prior to your scheduled procedure. Tardiness: Your scheduled time is also the cutoff time. If you do not arrive at least 15 minutes prior to your procedure, you will be rescheduled.  Children: Do not bring any children with you. Make arrangements to keep them home. Dress appropriately: There is always a possibility that your clothing may get soiled. Avoid long dresses. Valuables: Do not bring any jewelry or valuables.  Reasons to call and reschedule or cancel your procedure: (Following these recommendations will minimize the risk of a serious complication.) Surgeries: Avoid having procedures within 2 weeks of any surgery. (Avoid for 2 weeks before or after any surgery). Flu Shots: Avoid having procedures within 2 weeks of a flu shots or . (Avoid for 2 weeks before or after immunizations). Barium: Avoid having  a procedure within 7-10 days after having had a radiological study involving the use of radiological contrast. (Myelograms, Barium swallow or enema study). Heart attacks: Avoid any elective procedures or surgeries for the initial 6 months after a "Myocardial Infarction" (Heart Attack). Blood thinners: It is imperative that you stop these medications before procedures. Let us know if you if you take any blood thinner.  Infection: Avoid procedures during or within two weeks of an infection (including chest colds or  gastrointestinal problems). Symptoms associated with infections include: Localized redness, fever, chills, night sweats or profuse sweating, burning sensation when voiding, cough, congestion, stuffiness, runny nose, sore throat, diarrhea, nausea, vomiting, cold or Flu symptoms, recent or current infections. It is specially important if the infection is over the area that we intend to treat. Heart and lung problems: Symptoms that may suggest an active cardiopulmonary problem include: cough, chest pain, breathing difficulties or shortness of breath, dizziness, ankle swelling, uncontrolled high or unusually low blood pressure, and/or palpitations. If you are experiencing any of these symptoms, cancel your procedure and contact your primary care physician for an evaluation.  Remember:  Regular Business hours are:  Monday to Thursday 8:00 AM to 4:00 PM  Provider's Schedule: Delano Metz, MD:  Procedure days: Tuesday and Thursday 7:30 AM to 4:00 PM  Edward Jolly, MD:  Procedure days: Monday and Wednesday 7:30 AM to 4:00 PM Last  Updated: 01/10/2023 ______________________________________________________________________      ______________________________________________________________________    General Risks and Possible Complications  Patient Responsibilities: It is important that you read this as it is part of your informed consent. It is our duty to inform you of the risks and possible complications associated with treatments offered to you. It is your responsibility as a patient to read this and to ask questions about anything that is not clear or that you believe was not covered in this document.  Patient's Rights: You have the right to refuse treatment. You also have the right to change your mind, even after initially having agreed to have the treatment done. However, under this last option, if you wait until the last second to change your mind, you may be charged for the materials used  up to that point.  Introduction: Medicine is not an Visual merchandiser. Everything in Medicine, including the lack of treatment(s), carries the potential for danger, harm, or loss (which is by definition: Risk). In Medicine, a complication is a secondary problem, condition, or disease that can aggravate an already existing one. All treatments carry the risk of possible complications. The fact that a side effects or complications occurs, does not imply that the treatment was conducted incorrectly. It must be clearly understood that these can happen even when everything is done following the highest safety standards.  No treatment: You can choose not to proceed with the proposed treatment alternative. The "PRO(s)" would include: avoiding the risk of complications associated with the therapy. The "CON(s)" would include: not getting any of the treatment benefits. These benefits fall under one of three categories: diagnostic; therapeutic; and/or palliative. Diagnostic benefits include: getting information which can ultimately lead to improvement of the disease or symptom(s). Therapeutic benefits are those associated with the successful treatment of the disease. Finally, palliative benefits are those related to the decrease of the primary symptoms, without necessarily curing the condition (example: decreasing the pain from a flare-up of a chronic condition, such as incurable terminal cancer).  General Risks and Complications: These are associated to most interventional treatments. They can  occur alone, or in combination. They fall under one of the following six (6) categories: no benefit or worsening of symptoms; bleeding; infection; nerve damage; allergic reactions; and/or death. No benefits or worsening of symptoms: In Medicine there are no guarantees, only probabilities. No healthcare provider can ever guarantee that a medical treatment will work, they can only state the probability that it may. Furthermore, there is  always the possibility that the condition may worsen, either directly, or indirectly, as a consequence of the treatment. Bleeding: This is more common if the patient is taking a blood thinner, either prescription or over the counter (example: Goody Powders, Fish oil, Aspirin, Garlic, etc.), or if suffering a condition associated with impaired coagulation (example: Hemophilia, cirrhosis of the liver, low platelet counts, etc.). However, even if you do not have one on these, it can still happen. If you have any of these conditions, or take one of these drugs, make sure to notify your treating physician. Infection: This is more common in patients with a compromised immune system, either due to disease (example: diabetes, cancer, human immunodeficiency virus [HIV], etc.), or due to medications or treatments (example: therapies used to treat cancer and rheumatological diseases). However, even if you do not have one on these, it can still happen. If you have any of these conditions, or take one of these drugs, make sure to notify your treating physician. Nerve Damage: This is more common when the treatment is an invasive one, but it can also happen with the use of medications, such as those used in the treatment of cancer. The damage can occur to small secondary nerves, or to large primary ones, such as those in the spinal cord and brain. This damage may be temporary or permanent and it may lead to impairments that can range from temporary numbness to permanent paralysis and/or brain death. Allergic Reactions: Any time a substance or material comes in contact with our body, there is the possibility of an allergic reaction. These can range from a mild skin rash (contact dermatitis) to a severe systemic reaction (anaphylactic reaction), which can result in death. Death: In general, any medical intervention can result in death, most of the time due to an unforeseen  complication. ______________________________________________________________________      ______________________________________________________________________    Blood Thinners  IMPORTANT NOTICE:  If you take any of these, make sure to notify the nursing staff.  Failure to do so may result in serious injury.  Recommended time intervals to stop and restart blood-thinners, before & after invasive procedures  Generic Name Brand Name Pre-procedure: Stop medication for this amount of time before your procedure: Post-procedure: Wait this amount of time after the procedure before restarting your medication:  Abciximab Reopro 15 days 2 hrs  Alteplase Activase 10 days 10 days  Anagrelide Agrylin    Apixaban Eliquis 3 days 6 hrs  Cilostazol Pletal 3 days 5 hrs  Clopidogrel Plavix 7-10 days 2 hrs  Dabigatran Pradaxa 5 days 6 hrs  Dalteparin Fragmin 24 hours 4 hrs  Dipyridamole Aggrenox 11days 2 hrs  Edoxaban Lixiana; Savaysa 3 days 2 hrs  Enoxaparin  Lovenox 24 hours 4 hrs  Eptifibatide Integrillin 8 hours 2 hrs  Fondaparinux  Arixtra 72 hours 12 hrs  Hydroxychloroquine Plaquenil 11 days   Prasugrel Effient 7-10 days 6 hrs  Reteplase Retavase 10 days 10 days  Rivaroxaban Xarelto 3 days 6 hrs  Ticagrelor Brilinta 5-7 days 6 hrs  Ticlopidine Ticlid 10-14 days 2 hrs  Tinzaparin Innohep 24  hours 4 hrs  Tirofiban Aggrastat 8 hours 2 hrs  Warfarin Coumadin 5 days 2 hrs   Other medications with blood-thinning effects  Product indications Generic (Brand) names Note  Cholesterol Lipitor Stop 4 days before procedure  Blood thinner (injectable) Heparin (LMW or LMWH Heparin) Stop 24 hours before procedure  Cancer Ibrutinib (Imbruvica) Stop 7 days before procedure  Malaria/Rheumatoid Hydroxychloroquine (Plaquenil) Stop 11 days before procedure  Thrombolytics  10 days before or after procedures   Over-the-counter (OTC) Products with blood-thinning effects  Product Common names Stop Time   Aspirin > 325 mg Goody Powders, Excedrin, etc. 11 days  Aspirin <= 81 mg  7 days  Fish oil  4 days  Garlic supplements  7 days  Ginkgo biloba  36 hours  Ginseng  24 hours  NSAIDs Ibuprofen, Naprosyn, etc. 3 days  Vitamin E  4 days   ______________________________________________________________________

## 2023-02-07 NOTE — Progress Notes (Signed)
Safety precautions to be maintained throughout the outpatient stay will include: orient to surroundings, keep bed in low position, maintain call bell within reach at all times, provide assistance with transfer out of bed and ambulation.  

## 2023-02-19 NOTE — Progress Notes (Unsigned)
PROVIDER NOTE: Interpretation of information contained herein should be left to medically-trained personnel. Specific patient instructions are provided elsewhere under "Patient Instructions" section of medical record. This document was created in part using STT-dictation technology, any transcriptional errors that may result from this process are unintentional.  Patient: [name] Whitaker Type: Established DOB: Apr 16, 1951 MRN: [id number] PCP: [facility]  Service: Procedure DOS: 02/23/2023 Setting: Ambulatory Location: Ambulatory outpatient facility Delivery: Face-to-face Provider: Oswaldo Done, MD Specialty: Interventional Pain Management Specialty designation: 09 Location: Outpatient facility Ref. Prov.: [facility]       Interventional Therapy   Procedure: Sacroiliac Joint Steroid Injection #4    Laterality: Right     Level: PSIS (Posterior Superior Iliac Spine)  Imaging: Fluoroscopic guidance Anesthesia: Local anesthesia (1-2% Lidocaine) Anxiolysis: None                 Sedation: No Sedation                       DOS: 02/23/2023  Performed by: Oswaldo Done, MD  Purpose: Diagnostic/Therapeutic Indications: Sacroiliac joint pain in the lower back and hip area severe enough to impact quality of life or function. Rationale (medical necessity): procedure needed and proper for the diagnosis and/or treatment of Mr. Bun's medical symptoms and needs. 1. Chronic sacroiliac joint pain (Bilateral) (R>L)   2. Other spondylosis, sacral and sacrococcygeal region   3. Somatic dysfunction of sacroiliac joints (Bilateral) (R>L)   4. Chronic low back pain (1ry area of Pain) (Bilateral) (R>L) w/o sciatica   5. Trigger point with back pain   6. Lumbar trigger point syndrome   7. Chronic anticoagulation (Xarelto)    NAS-11 Pain score:   Pre-procedure: 7 /10   Post-procedure: 0-No pain/10     Target: Interarticular sacroiliac joint. Location: Medial to  the postero-medial edge of iliac spine. Region: Lumbosacral-sacrococcygeal. Approach: Superior postero-medial percutaneous approach. Type of procedure: Percutaneous joint injection.  Position / Prep / Materials:  Position: Prone  Prep solution: DuraPrep (Iodine Povacrylex [0.7% available iodine] and Isopropyl Alcohol, 74% w/w) Prep Area: Entire posterior lumbosacral area  Materials:  Tray: Block Needle(s):  Type: Spinal  Gauge (G): 22  Length: 5-in Qty: 1  H&P (Pre-op Assessment):  Mr. Yeakey is a 72 y.o. (year old), male patient, seen today for interventional treatment. He  has a past surgical history that includes Appendectomy; Nasal septum surgery; Tonsillectomy; Testicle surgery; Cardiac catheterization; Back surgery; Cataract extraction w/PHACO (Right, 10/20/2020); and Cataract extraction w/PHACO (Left, 11/03/2020). Mr. Ostrand has a current medication list which includes the following prescription(s): acetaminophen, allopurinol, ascorbic acid, candesartan, chlorthalidone, cholecalciferol, cyanocobalamin, cyclobenzaprine, dulaglutide, epinephrine, famotidine, fexofenadine, finasteride, fluticasone, gabapentin, hydrocortisone, ketoconazole, lidocaine, potassium chloride sa, propranolol, rivaroxaban, rosuvastatin, topiramate, and tramadol. His primarily concern today is the Back Pain (Lower back and right hip)  Initial Vital Signs:  Pulse/HCG Rate: 62ECG Heart Rate: 68 Temp: 97.7 F (36.5 C) Resp: 18 BP: 125/81 SpO2: 100 %  BMI: Estimated body mass index is 36.59 kg/m as calculated from the following:   Height as of this encounter: 5\' 10"  (1.778 m).   Weight as of this encounter: 255 lb (115.7 kg).  Risk Assessment: Allergies: Reviewed. He is allergic to penicillins, amlodipine, amlodipine besylate, atorvastatin, erenumab-aooe, lisinopril, penicillin g, and simvastatin.  Allergy Precautions: None required Coagulopathies: Reviewed. None identified.  Blood-thinner therapy: None  at this time Active Infection(s): Reviewed. None identified. Mr. Voliva is afebrile  Site Confirmation: Mr. Mow was  asked to confirm the procedure and laterality before marking the site Procedure checklist: Completed Consent: Before the procedure and under the influence of no sedative(s), amnesic(s), or anxiolytics, the patient was informed of the treatment options, risks and possible complications. To fulfill our ethical and legal obligations, as recommended by the American Medical Association's Code of Ethics, I have informed the patient of my clinical impression; the nature and purpose of the treatment or procedure; the risks, benefits, and possible complications of the intervention; the alternatives, including doing nothing; the risk(s) and benefit(s) of the alternative treatment(s) or procedure(s); and the risk(s) and benefit(s) of doing nothing. The patient was provided information about the general risks and possible complications associated with the procedure. These may include, but are not limited to: failure to achieve desired goals, infection, bleeding, organ or nerve damage, allergic reactions, paralysis, and death. In addition, the patient was informed of those risks and complications associated to the procedure, such as failure to decrease pain; infection; bleeding; organ or nerve damage with subsequent damage to sensory, motor, and/or autonomic systems, resulting in permanent pain, numbness, and/or weakness of one or several areas of the body; allergic reactions; (i.e.: anaphylactic reaction); and/or death. Furthermore, the patient was informed of those risks and complications associated with the medications. These include, but are not limited to: allergic reactions (i.e.: anaphylactic or anaphylactoid reaction(s)); adrenal axis suppression; blood sugar elevation that in diabetics may result in ketoacidosis or comma; water retention that in patients with history of congestive heart  failure may result in shortness of breath, pulmonary edema, and decompensation with resultant heart failure; weight gain; swelling or edema; medication-induced neural toxicity; particulate matter embolism and blood vessel occlusion with resultant organ, and/or nervous system infarction; and/or aseptic necrosis of one or more joints. Finally, the patient was informed that Medicine is not an exact science; therefore, there is also the possibility of unforeseen or unpredictable risks and/or possible complications that may result in a catastrophic outcome. The patient indicated having understood very clearly. We have given the patient no guarantees and we have made no promises. Enough time was given to the patient to ask questions, all of which were answered to the patient's satisfaction. Mr. Gigante has indicated that he wanted to continue with the procedure. Attestation: I, the ordering provider, attest that I have discussed with the patient the benefits, risks, side-effects, alternatives, likelihood of achieving goals, and potential problems during recovery for the procedure that I have provided informed consent. Date  Time: 02/23/2023  8:26 AM  Pre-Procedure Preparation:  Monitoring: As per clinic protocol. Respiration, ETCO2, SpO2, BP, heart rate and rhythm monitor placed and checked for adequate function Safety Precautions: Patient was assessed for positional comfort and pressure points before starting the procedure. Time-out: I initiated and conducted the "Time-out" before starting the procedure, as per protocol. The patient was asked to participate by confirming the accuracy of the "Time Out" information. Verification of the correct person, site, and procedure were performed and confirmed by me, the nursing staff, and the patient. "Time-out" conducted as per Joint Commission's Universal Protocol (UP.01.01.01). Time: 0920 Start Time: 0920 hrs.  Description/Narrative of Procedure:          Start  Time: 0920 hrs.  Rationale (medical necessity): procedure needed and proper for the diagnosis and/or treatment of the patient's medical symptoms and needs. Procedural Technique Safety Precautions: Aspiration looking for blood return was conducted prior to all injections. At no point did we inject any substances, as a needle was being  advanced. No attempts were made at seeking any paresthesias. Safe injection practices and needle disposal techniques used. Medications properly checked for expiration dates. SDV (single dose vial) medications used. Description of the Procedure: Protocol guidelines were followed. The patient was assisted into a comfortable position. The target area was identified and the area prepped in the usual manner. Skin & deeper tissues infiltrated with local anesthetic. Appropriate amount of time allowed to pass for local anesthetics to take effect. The procedure needles were then advanced to the target area. Proper needle placement secured. Negative aspiration confirmed. Solution injected in intermittent fashion, asking for systemic symptoms every 0.5cc of injectate. The needles were then removed and the area cleansed, making sure to leave some of the prepping solution back to take advantage of its long term bactericidal properties.  Technical description of procedure:  Fluoroscopy using a posterior anterior 45 degree angle from the midline aiming at the anterolateral aspect of the patient was used to find a direct path into the sacroiliac joint, the superior medial to posterior superior iliac spine.  The skin was marked where the desired target and the skin infiltrated with local anesthetics.  The procedure needle was then advanced until the joint was entered.  Once inside of the joint, we then proceeded to inject the desired solution.  Vitals:   02/23/23 0826 02/23/23 0920 02/23/23 0928  BP: 125/81 (!) 137/95 (!) 127/95  Pulse: 62    Resp:  18 17  Temp: 97.7 F (36.5 C)    SpO2:  100% 98% 97%  Weight: 255 lb (115.7 kg)    Height: 5\' 10"  (1.778 m)       End Time: 0927 hrs.  Imaging Guidance (Non-Spinal):          Type of Imaging Technique: Fluoroscopy Guidance (Non-Spinal) Indication(s): Assistance in needle guidance and placement for procedures requiring needle placement in or near specific anatomical locations not easily accessible without such assistance. Exposure Time: Please see nurses notes. Contrast: None used. Fluoroscopic Guidance: I was personally present during the use of fluoroscopy. "Tunnel Vision Technique" used to obtain the best possible view of the target area. Parallax error corrected before commencing the procedure. "Direction-depth-direction" technique used to introduce the needle under continuous pulsed fluoroscopy. Once target was reached, antero-posterior, oblique, and lateral fluoroscopic projection used confirm needle placement in all planes. Images permanently stored in EMR. Interpretation: No contrast injected. I personally interpreted the imaging intraoperatively. Adequate needle placement confirmed in multiple planes. Permanent images saved into the patient's record.  Post-operative Assessment:  Post-procedure Vital Signs:  Pulse/HCG Rate: 6262 Temp: 97.7 F (36.5 C) Resp: 17 BP: (!) 127/95 SpO2: 97 %  EBL: None  Complications: No immediate post-treatment complications observed by team, or reported by patient.  Note: The patient tolerated the entire procedure well. A repeat set of vitals were taken after the procedure and the patient was kept under observation following institutional policy, for this type of procedure. Post-procedural neurological assessment was performed, showing return to baseline, prior to discharge. The patient was provided with post-procedure discharge instructions, including a section on how to identify potential problems. Should any problems arise concerning this procedure, the patient was given instructions to  immediately contact us, at any time, without hesitation. In any case, we plan to contact the patient by telephone for a follow-up status report regarding this interventional procedure.  Comments:  No additional relevant information.  Plan of Care (POC)  Orders:  Orders Placed This Encounter  Procedures   SACROILIAC JOINT  INJECTION    Scheduling Instructions:     Side: Right-sided     Sedation: Patient's choice.     Timeframe: Today    Order Specific Question:   Where will this procedure be performed?    Answer:   [facility]   TRIGGER POINT INJECTION    Scheduling Instructions:     Area: Lower Back     Side: Right     Sedation: Patient's choice.     Timeframe: Today    Order Specific Question:   Where will this procedure be performed?    Answer:   [facility]   DG PAIN CLINIC C-ARM 1-60 MIN NO REPORT    Intraoperative interpretation by procedural physician at [facility].    Standing Status:   Standing    Number of Occurrences:   1    Order Specific Question:   Reason for exam:    Answer:   Assistance in needle guidance and placement for procedures requiring needle placement in or near specific anatomical locations not easily accessible without such assistance.   Informed Consent Details: Physician/Practitioner Attestation; Transcribe to consent form and obtain patient signature    Nursing Order: Transcribe to consent form and obtain patient signature. Note: Always confirm laterality of pain with Mr. Mcdole, before procedure.    Order Specific Question:   Physician/Practitioner attestation of informed consent for procedure/surgical case    Answer:   I, the physician/practitioner, attest that I have discussed with the patient the benefits, risks, side effects, alternatives, likelihood of achieving goals and potential problems during recovery for the procedure that I have provided informed consent.    Order Specific Question:   Procedure    Answer:    Sacroiliac Joint Block    Order Specific Question:   Physician/Practitioner performing the procedure    Answer:   Chantil Bari A. 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    Please confirm that the patient has stopped the Xarelto (Rivaroxaban) x 3 days prior to procedure or surgery.    Standing Status:   Standing    Number of Occurrences:   1   Provide equipment / supplies at bedside    Procedure tray: "Block Tray" (Disposable  single use) Skin infiltration needle: Regular 1.5-in, 25-G, (x1) Block Needle type: Spinal Amount/quantity: 2 Size: Medium (5-inch) Gauge: 22G    Standing Status:   Standing    Number of Occurrences:   1    Order Specific Question:   Specify    Answer:   Block Tray   Informed Consent Details: Physician/Practitioner Attestation; Transcribe to consent form and obtain patient signature    Provider Attestation: I, Shuna Tabor A. Laban Emperor, MD, (Pain Management Specialist), the physician/practitioner, attest that I have discussed with the patient the benefits, risks, side effects, alternatives, likelihood of achieving goals and potential problems during recovery for the procedure that I have provided informed consent.    Scheduling Instructions:     Note: Always confirm laterality of pain with Mr. Matsui, before procedure.     Transcribe to consent form and obtain patient signature.    Order Specific Question:   Physician/Practitioner attestation of informed consent for procedure/surgical case    Answer:   I, the physician/practitioner,  attest that I have discussed with the patient the benefits, risks, side effects, alternatives, likelihood of achieving goals and potential problems during recovery for the procedure that I  have provided informed consent.    Order Specific Question:   Procedure    Answer:   Myoneural Block (Trigger Point injection)    Order Specific Question:   Physician/Practitioner performing the procedure    Answer:   Zaidyn Claire A. Laban Emperor MD    Order Specific Question:   Indication/Reason    Answer:   Musculoskeletal pain/myofascial pain secondary to trigger point   Care order/instruction: Please confirm that the patient has stopped the Xarelto (Rivaroxaban) x 3 days prior to procedure or surgery.    Please confirm that the patient has stopped the Xarelto (Rivaroxaban) x 3 days prior to procedure or surgery.    Standing Status:   Standing    Number of Occurrences:   1   Bleeding precautions    Standing Status:   Standing    Number of Occurrences:   1   Bleeding precautions    Standing Status:   Standing    Number of Occurrences:   1   Chronic Opioid Analgesic:  No opioid analgesics prescribed by our practice. Highest recorded MME/day: 26.67 mg/day MME/day: 0 mg/day   Medications ordered for procedure: Meds ordered this encounter  Medications   lidocaine (XYLOCAINE) 2 % (with pres) injection 400 mg   pentafluoroprop-tetrafluoroeth (GEBAUERS) aerosol   DISCONTD: lactated ringers infusion   DISCONTD: midazolam (VERSED) 5 MG/5ML injection 0.5-2 mg    Make sure Flumazenil is available in the pyxis when using this medication. If oversedation occurs, administer 0.2 mg IV over 15 sec. If after 45 sec no response, administer 0.2 mg again over 1 min; may repeat at 1 min intervals; not to exceed 4 doses (1 mg)   DISCONTD: fentaNYL (SUBLIMAZE) injection 25-50 mcg    Make sure Narcan is available in the pyxis when using this medication. In the event of respiratory depression (RR< 8/min): Titrate NARCAN (naloxone) in increments of 0.1 to 0.2 mg IV at 2-3 minute intervals, until desired degree of reversal.   methylPREDNISolone acetate (DEPO-MEDROL) injection 80 mg   ropivacaine (PF) 2 mg/mL (0.2%)  (NAROPIN) injection 9 mL   triamcinolone acetonide (KENALOG-40) injection 40 mg   ropivacaine (PF) 2 mg/mL (0.2%) (NAROPIN) injection 4 mL   Medications administered: We administered lidocaine, pentafluoroprop-tetrafluoroeth, methylPREDNISolone acetate, ropivacaine (PF) 2 mg/mL (0.2%), triamcinolone acetonide, and ropivacaine (PF) 2 mg/mL (0.2%).  See the medical record for exact dosing, route, and time of administration.  Follow-up plan:   Return in about 2 weeks (around 03/09/2023) for (Face2F), (PPE).       Interventional Therapies  Risk factors  Considerations:   NOTE: Xarelto Anticoagulation (Stop: 3 days  Restart: 6 hours) HTN   Planned  Pending:   Therapeutic left lumbar facet RFA #3    Under consideration:   Possible right opturator + femoral NB  Possible right opturator + femoral nerve RFA    Completed:    Therapeutic right IA hip joint inj. x2 (04/30/2019) (100/100/50/90-100) Diagnostic bilateral SI joint Blk x2 (03/10/2020) (50/50/100 x 5 wks/0)  Diagnostic/therapeutic right SI joint Blk x3 (12/15/2022) (100/100/0/0)  Diagnostic bilateral lumbar facet MBB x1 (05/07/2020) (100/100/100 x1 week/>75)  Therapeutic left lumbar facet RFA x3 (06/16/2022) (100/100/100/100)  Therapeutic right lumbar facet RFA x3 (05/24/2022) (100/100/80/80) (by 06/16/2022 it had progressed to 100% ongoing relief)   Palliative options:   Palliative right IA hip  joint inj.  Palliative bilateral SI joint Blk  Therapeutic/palliative lumbar facet RFA         Recent Visits Date Type Provider Dept  02/07/23 Office Visit Delano Metz, MD [facility]  01/17/23 Procedure visit Delano Metz, MD [facility]  01/05/23 Procedure visit Edward Jolly, MD [facility]  12/27/22 Office Visit Delano Metz, MD [facility]  12/15/22 Procedure visit Delano Metz, MD [facility]  Showing recent visits within past 90 days and meeting  all other requirements Today's Visits Date Type Provider Dept  02/23/23 Procedure visit Delano Metz, MD [facility]  Showing today's visits and meeting all other requirements Future Appointments Date Type Provider Dept  03/09/23 Appointment Delano Metz, MD [facility]  Showing future appointments within next 90 days and meeting all other requirements  Disposition: Discharge home  Discharge (Date  Time): 02/23/2023; 0935 hrs.   Primary Care Physician: [facility] Location[facility] Note by: Oswaldo Done, MD (TTS technology used. I apologize for any typographical errors that were not detected and corrected.) Date: 02/23/2023; Time: 9:35 AM  Disclaimer:  Medicine is not an Visual merchandiser. The only guarantee in medicine is that nothing is guaranteed. It is important to note that the decision to proceed with this intervention was based on the information collected from the patient. The Data and conclusions were drawn from the patient's questionnaire, the interview, and the physical examination. Because the information was provided in large part by the patient, it cannot be guaranteed that it has not been purposely or unconsciously manipulated. Every effort has been made to obtain as much relevant data as possible for this evaluation. It is important to note that the conclusions that lead to this procedure are derived in large part from the available data. Always take into account that the treatment will also be dependent on availability of resources and existing treatment guidelines, considered by other Pain Management Practitioners as being common knowledge and practice, at the time of the intervention. For Medico-Legal purposes, it is also important to point out that variation in procedural techniques and pharmacological choices are the acceptable norm. The indications, contraindications, technique, and results of  the above procedure should only be interpreted and judged by a Board-Certified Interventional Pain Specialist with extensive familiarity and expertise in the same exact procedure and technique.

## 2023-02-22 DIAGNOSIS — Z658 Other specified problems related to psychosocial circumstances: Secondary | ICD-10-CM | POA: Insufficient documentation

## 2023-02-22 DIAGNOSIS — M549 Dorsalgia, unspecified: Secondary | ICD-10-CM | POA: Insufficient documentation

## 2023-02-22 DIAGNOSIS — K219 Gastro-esophageal reflux disease without esophagitis: Secondary | ICD-10-CM | POA: Insufficient documentation

## 2023-02-22 DIAGNOSIS — N1832 Chronic kidney disease, stage 3b: Secondary | ICD-10-CM | POA: Insufficient documentation

## 2023-02-22 DIAGNOSIS — D6859 Other primary thrombophilia: Secondary | ICD-10-CM | POA: Insufficient documentation

## 2023-02-22 DIAGNOSIS — I77819 Aortic ectasia, unspecified site: Secondary | ICD-10-CM | POA: Insufficient documentation

## 2023-02-22 DIAGNOSIS — M1A00X0 Idiopathic chronic gout, unspecified site, without tophus (tophi): Secondary | ICD-10-CM | POA: Insufficient documentation

## 2023-02-22 DIAGNOSIS — M5459 Other low back pain: Secondary | ICD-10-CM | POA: Insufficient documentation

## 2023-02-22 DIAGNOSIS — I5189 Other ill-defined heart diseases: Secondary | ICD-10-CM | POA: Insufficient documentation

## 2023-02-22 NOTE — Progress Notes (Unsigned)
PROVIDER NOTE: Interpretation of information contained herein should be left to medically-trained personnel. Specific patient instructions are provided elsewhere under "Patient Instructions" section of medical record. This document was created in part using STT-dictation technology, any transcriptional errors that may result from this process are unintentional.  Patient: [name] Whitaker Type: Established DOB: 1950-09-29 MRN: [id number] PCP: [facility]  Service: Procedure DOS: 02/23/2023 Setting: Ambulatory Location: Ambulatory outpatient facility Delivery: Face-to-face Provider: Oswaldo Done, MD Specialty: Interventional Pain Management Specialty designation: 09 Location: Outpatient facility Ref. Prov.: [facility]       Interventional Therapy   Type:  Right buttocks Trigger Point Injection (Myoneural Block) (1-2 muscle groups)  #1 (w/ steroids)  CPT: 20552 Laterality: Right (-RT)   Imaging: Fluoroscopy-guided (Assisted) Non-spinal ([id number]) Anesthesia: Local anesthesia (1-2% Lidocaine) Anxiolysis: None                 Sedation: No Sedation                       DOS: 02/23/2023  Performed by: Oswaldo Done, MD  Medical Necessity (reasoning)  Purpose: Diagnostic/Therapeutic Rationale (medical necessity): procedure needed and proper for the diagnosis and/or treatment of [name] Whitaker's medical symptoms and needs. Indications: Right low back and buttocks pain severe enough to impact quality of life and/or function. 1. Chronic low back pain (1ry area of Pain) (Bilateral) (R>L) w/o sciatica   2. Trigger point with back pain   3. Lumbar trigger point syndrome   4. Chronic anticoagulation (Xarelto)    NAS-11 Pain score:   Pre-procedure: 7 /10   Post-procedure: 0-No pain/10       Muscle: Gluteus medius and minimus   Target: Myoneural mass of trigger point. Region: Buttocks Anatomic Surface: Posterior Anatomic Area: Sacrococcygeal Level:   lateral to PSIS  Approach: Percutaneous  Type of procedure: Myoneural injection   Position  Prep  Materials  Position: Prone. Patient assisted into a comfortable position. Pressure points checked.  Prep solution: DuraPrep (Iodine Povacrylex [0.7% available iodine] and Isopropyl Alcohol, 74% w/w) The target area was identified and the area prepped in the usual manner.  Prep Area: Right posterior sacrococcygeal area  Materials:   Tray: Block Needle(s):  Type: Regular  Gauge (G): 22  Length:  5.0"   Qty: 1  H&P (Pre-op Assessment):  [name] Whitaker is a 72 y.o. (year old), male patient, seen today for interventional treatment. He  has a past surgical history that includes Appendectomy; Nasal septum surgery; Tonsillectomy; Testicle surgery; Cardiac catheterization; Back surgery; Cataract extraction w/PHACO (Right, 10/20/2020); and Cataract extraction w/PHACO (Left, 11/03/2020). [name] Whitaker has a current medication list which includes the following prescription(s): acetaminophen, allopurinol, ascorbic acid, candesartan, chlorthalidone, cholecalciferol, cyanocobalamin, cyclobenzaprine, dulaglutide, epinephrine, famotidine, fexofenadine, finasteride, fluticasone, gabapentin, hydrocortisone, ketoconazole, lidocaine, potassium chloride sa, propranolol, rivaroxaban, rosuvastatin, topiramate, and tramadol. His primarily concern today is the Back Pain (Lower back and right hip)  Initial Vital Signs:  Pulse/HCG Rate: 62ECG Heart Rate: 68 Temp: 97.7 F (36.5 C) Resp: 18 BP: 125/81 SpO2: 100 %  BMI: Estimated body mass index is 36.59 kg/m as calculated from the following:   Height as of this encounter: 5\' 10"  (1.778 m).   Weight as of this encounter: 255 lb (115.7 kg).  Risk Assessment: Allergies: Reviewed. He is allergic to penicillins, amlodipine, amlodipine besylate, atorvastatin, erenumab-aooe, lisinopril, penicillin g, and simvastatin.  Allergy Precautions: None required Coagulopathies:  Reviewed. None identified.  Blood-thinner therapy: None at this time  Active Infection(s): Reviewed. None identified. [name] Whitaker is afebrile  Site Confirmation: [name] Whitaker was asked to confirm the procedure and laterality before marking the site Procedure checklist: Completed Consent: Before the procedure and under the influence of no sedative(s), amnesic(s), or anxiolytics, the patient was informed of the treatment options, risks and possible complications. To fulfill our ethical and legal obligations, as recommended by the American Medical Association's Code of Ethics, I have informed the patient of my clinical impression; the nature and purpose of the treatment or procedure; the risks, benefits, and possible complications of the intervention; the alternatives, including doing nothing; the risk(s) and benefit(s) of the alternative treatment(s) or procedure(s); and the risk(s) and benefit(s) of doing nothing. The patient was provided information about the general risks and possible complications associated with the procedure. These may include, but are not limited to: failure to achieve desired goals, infection, bleeding, organ or nerve damage, allergic reactions, paralysis, and death. In addition, the patient was informed of those risks and complications associated to the procedure, such as failure to decrease pain; infection; bleeding; organ or nerve damage with subsequent damage to sensory, motor, and/or autonomic systems, resulting in permanent pain, numbness, and/or weakness of one or several areas of the body; allergic reactions; (i.e.: anaphylactic reaction); and/or death. Furthermore, the patient was informed of those risks and complications associated with the medications. These include, but are not limited to: allergic reactions (i.e.: anaphylactic or anaphylactoid reaction(s)); adrenal axis suppression; blood sugar elevation that in diabetics may result in ketoacidosis or comma; water  retention that in patients with history of congestive heart failure may result in shortness of breath, pulmonary edema, and decompensation with resultant heart failure; weight gain; swelling or edema; medication-induced neural toxicity; particulate matter embolism and blood vessel occlusion with resultant organ, and/or nervous system infarction; and/or aseptic necrosis of one or more joints. Finally, the patient was informed that Medicine is not an exact science; therefore, there is also the possibility of unforeseen or unpredictable risks and/or possible complications that may result in a catastrophic outcome. The patient indicated having understood very clearly. We have given the patient no guarantees and we have made no promises. Enough time was given to the patient to ask questions, all of which were answered to the patient's satisfaction. [name] Whitaker has indicated that he wanted to continue with the procedure. Attestation: I, the ordering provider, attest that I have discussed with the patient the benefits, risks, side-effects, alternatives, likelihood of achieving goals, and potential problems during recovery for the procedure that I have provided informed consent. Date  Time: 02/23/2023  8:26 AM   Pre-Procedure Preparation:  Monitoring: As per clinic protocol. Respiration, ETCO2, SpO2, BP, heart rate and rhythm monitor placed and checked for adequate function Safety Precautions: Patient was assessed for positional comfort and pressure points before starting the procedure. Time-out: I initiated and conducted the "Time-out" before starting the procedure, as per protocol. The patient was asked to participate by confirming the accuracy of the "Time Out" information. Verification of the correct person, site, and procedure were performed and confirmed by me, the nursing staff, and the patient. "Time-out" conducted as per Joint Commission's Universal Protocol (UP.01.01.01). Time: 0920 Start Time: 0920  hrs.   Narrative                Start Time: 0920 hrs.  Standard Safety Precautions: Protocol guidelines were followed. Aspiration was conducted prior to injection. At no point did I inject any substances, as a needle was being  advanced. No attempts were made at seeking a paresthesia. Safe injection practices and needle disposal techniques used. Medications properly checked for expiration dates. SDV (single dose vial) medications used.  Local Anesthesia: Skin & deeper tissues infiltrated with local anesthetic. Appropriate amount of time allowed for local anesthetics to take effect.   Technical description:  The target area was identified and the area prepped in the usual manner. The procedure needles were then advanced to the target area. Proper needle placement secured. Negative aspiration confirmed. Solution injected in intermittent fashion, asking for systemic symptoms every 0.5cc of injectate. The needles were then removed and the area cleansed, making sure to leave some of the prepping solution back to take advantage of its long term bactericidal properties.  Vitals:   02/23/23 0826 02/23/23 0920 02/23/23 0928  BP: 125/81 (!) 137/95 (!) 127/95  Pulse: 62    Resp:  18 17  Temp: 97.7 F (36.5 C)    SpO2: 100% 98% 97%  Weight: 255 lb (115.7 kg)    Height: 5\' 10"  (1.778 m)       End Time: 0927 hrs.  Imaging Guidance                Type of Imaging Technique: None used Indication(s): N/A Exposure Time: No patient exposure Contrast: None used. Fluoroscopic Guidance: N/A Ultrasound Guidance: N/A Interpretation: N/A   Post-operative Assessment:  Post-procedure Vital Signs:  Pulse/HCG Rate: 6262 Temp: 97.7 F (36.5 C) Resp: 17 BP: (!) 127/95 SpO2: 97 %  EBL: None  Complications: No immediate post-treatment complications observed by team, or reported by patient.  Note: The patient tolerated the entire procedure well. A repeat set of vitals were taken after the procedure and  the patient was kept under observation following institutional policy, for this type of procedure. Post-procedural neurological assessment was performed, showing return to baseline, prior to discharge. The patient was provided with post-procedure discharge instructions, including a section on how to identify potential problems. Should any problems arise concerning this procedure, the patient was given instructions to immediately contact us, at any time, without hesitation. In any case, we plan to contact the patient by telephone for a follow-up status report regarding this interventional procedure.  Comments:  No additional relevant information.   Plan of Care (POC)  Orders:  Orders Placed This Encounter  Procedures   SACROILIAC JOINT INJECTION    Scheduling Instructions:     Side: Right-sided     Sedation: Patient's choice.     Timeframe: Today    Order Specific Question:   Where will this procedure be performed?    Answer:   [facility]   TRIGGER POINT INJECTION    Scheduling Instructions:     Area: Lower Back     Side: Right     Sedation: Patient's choice.     Timeframe: Today    Order Specific Question:   Where will this procedure be performed?    Answer:   [facility]   DG PAIN CLINIC C-ARM 1-60 MIN NO REPORT    Intraoperative interpretation by procedural physician at [facility].    Standing Status:   Standing    Number of Occurrences:   1    Order Specific Question:   Reason for exam:    Answer:   Assistance in needle guidance and placement for procedures requiring needle placement in or near specific anatomical locations not easily accessible without such assistance.   Informed Consent Details: Physician/Practitioner Attestation; Transcribe to  consent form and obtain patient signature    Nursing Order: Transcribe to consent form and obtain patient signature. Note: Always confirm laterality of pain with [name] Whitaker, before procedure.    Order  Specific Question:   Physician/Practitioner attestation of informed consent for procedure/surgical case    Answer:   I, the physician/practitioner, attest that I have discussed with the patient the benefits, risks, side effects, alternatives, likelihood of achieving goals and potential problems during recovery for the procedure that I have provided informed consent.    Order Specific Question:   Procedure    Answer:   Sacroiliac Joint Block    Order Specific Question:   Physician/Practitioner performing the procedure    Answer:   Nitzia Perren A. 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    Please confirm that the patient has stopped the Xarelto (Rivaroxaban) x 3 days prior to procedure or surgery.    Standing Status:   Standing    Number of Occurrences:   1   Provide equipment / supplies at bedside    Procedure tray: "Block Tray" (Disposable  single use) Skin infiltration needle: Regular 1.5-in, 25-G, (x1) Block Needle type: Spinal Amount/quantity: 2 Size: Medium (5-inch) Gauge: 22G    Standing Status:   Standing    Number of Occurrences:   1    Order Specific Question:   Specify    Answer:   Block Tray   Informed Consent Details: Physician/Practitioner Attestation; Transcribe to consent form and obtain patient signature    Provider Attestation: I, Donnis Phaneuf A. Laban Emperor, MD, (Pain Management Specialist), the physician/practitioner, attest that I have discussed with the patient the benefits, risks, side effects, alternatives, likelihood of achieving goals and potential problems during recovery for the procedure that I have provided informed consent.    Scheduling Instructions:     Note: Always confirm laterality of pain with [name] Whitaker,  before procedure.     Transcribe to consent form and obtain patient signature.    Order Specific Question:   Physician/Practitioner attestation of informed consent for procedure/surgical case    Answer:   I, the physician/practitioner, attest that I have discussed with the patient the benefits, risks, side effects, alternatives, likelihood of achieving goals and potential problems during recovery for the procedure that I have provided informed consent.    Order Specific Question:   Procedure    Answer:   Myoneural Block (Trigger Point injection)    Order Specific Question:   Physician/Practitioner performing the procedure    Answer:   Jayna Mulnix A. Laban Emperor MD    Order Specific Question:   Indication/Reason    Answer:   Musculoskeletal pain/myofascial pain secondary to trigger point   Care order/instruction: Please confirm that the patient has stopped the Xarelto (Rivaroxaban) x 3 days prior to procedure or surgery.    Please confirm that the patient has stopped the Xarelto (Rivaroxaban) x 3 days prior to procedure or surgery.    Standing Status:   Standing    Number of Occurrences:   1   Bleeding precautions    Standing Status:   Standing    Number of Occurrences:   1   Bleeding precautions    Standing Status:   Standing    Number of  Occurrences:   1   Chronic Opioid Analgesic:  No opioid analgesics prescribed by our practice. Highest recorded MME/day: 26.67 mg/day MME/day: 0 mg/day   Medications ordered for procedure: Meds ordered this encounter  Medications   lidocaine (XYLOCAINE) 2 % (with pres) injection 400 mg   pentafluoroprop-tetrafluoroeth (GEBAUERS) aerosol   DISCONTD: lactated ringers infusion   DISCONTD: midazolam (VERSED) 5 MG/5ML injection 0.5-2 mg    Make sure Flumazenil is available in the pyxis when using this medication. If oversedation occurs, administer 0.2 mg IV over 15 sec. If after 45 sec no response, administer 0.2 mg again over 1 min; may repeat at 1 min  intervals; not to exceed 4 doses (1 mg)   DISCONTD: fentaNYL (SUBLIMAZE) injection 25-50 mcg    Make sure Narcan is available in the pyxis when using this medication. In the event of respiratory depression (RR< 8/min): Titrate NARCAN (naloxone) in increments of 0.1 to 0.2 mg IV at 2-3 minute intervals, until desired degree of reversal.   methylPREDNISolone acetate (DEPO-MEDROL) injection 80 mg   ropivacaine (PF) 2 mg/mL (0.2%) (NAROPIN) injection 9 mL   triamcinolone acetonide (KENALOG-40) injection 40 mg   ropivacaine (PF) 2 mg/mL (0.2%) (NAROPIN) injection 4 mL   Medications administered: We administered lidocaine, pentafluoroprop-tetrafluoroeth, methylPREDNISolone acetate, ropivacaine (PF) 2 mg/mL (0.2%), triamcinolone acetonide, and ropivacaine (PF) 2 mg/mL (0.2%).  See the medical record for exact dosing, route, and time of administration.  Follow-up plan:   Return in about 2 weeks (around 03/09/2023) for (Face2F), (PPE).       Interventional Therapies  Risk factors  Considerations:   NOTE: Xarelto Anticoagulation (Stop: 3 days  Restart: 6 hours) HTN   Planned  Pending:   Therapeutic left lumbar facet RFA #3    Under consideration:   Possible right opturator + femoral NB  Possible right opturator + femoral nerve RFA    Completed:   Therapeutic right IA hip joint inj. x2 (04/30/2019) (100/100/50/90-100) Diagnostic bilateral SI joint Blk x2 (03/10/2020) (50/50/100 x 5 wks/0)  Diagnostic/therapeutic right SI joint Blk x3 (12/15/2022) (100/100/0/0)  Diagnostic bilateral lumbar facet MBB x1 (05/07/2020) (100/100/100 x1 week/>75)  Therapeutic left lumbar facet RFA x3 (06/16/2022) (100/100/100/100)  Therapeutic right lumbar facet RFA x3 (05/24/2022) (100/100/80/80) (by 06/16/2022 it had progressed to 100% ongoing relief)   Palliative options:   Palliative right IA hip joint inj.  Palliative bilateral SI joint Blk  Therapeutic/palliative lumbar facet RFA       Recent  Visits Date Type Provider Dept  02/07/23 Office Visit Delano Metz, MD [facility]  01/17/23 Procedure visit Delano Metz, MD [facility]  01/05/23 Procedure visit Edward Jolly, MD [facility]  12/27/22 Office Visit Delano Metz, MD [facility]  12/15/22 Procedure visit Delano Metz, MD [facility]  Showing recent visits within past 90 days and meeting all other requirements Today's Visits Date Type Provider Dept  02/23/23 Procedure visit Delano Metz, MD [facility]  Showing today's visits and meeting all other requirements Future Appointments Date Type Provider Dept  03/09/23 Appointment Delano Metz, MD [facility]  Showing future appointments within next 90 days and meeting all other requirements  Disposition: Discharge home  Discharge (Date  Time): 02/23/2023; 0935 hrs.   Primary Care Physician: [facility] Location: [facility] Note by: Oswaldo Done, MD (TTS technology used. I apologize for any typographical errors that were not detected and corrected.) Date: 02/23/2023; Time: 10:48 AM  Disclaimer:  Medicine is not an Visual merchandiser. The only guarantee in medicine is that nothing is guaranteed. It is important to note that the decision to proceed with this intervention was based on the information collected from the patient. The Data and conclusions were drawn from the patient's questionnaire, the interview, and the physical examination. Because the information was provided in large part by the patient, it cannot be guaranteed that it has not been purposely or unconsciously manipulated. Every effort has been made to obtain as much relevant data as possible for this evaluation. It is important to note that the conclusions that lead to this procedure are derived in large part from the available data. Always take into account that the  treatment will also be dependent on availability of resources and existing treatment guidelines, considered by other Pain Management Practitioners as being common knowledge and practice, at the time of the intervention. For Medico-Legal purposes, it is also important to point out that variation in procedural techniques and pharmacological choices are the acceptable norm. The indications, contraindications, technique, and results of the above procedure should only be interpreted and judged by a Board-Certified Interventional Pain Specialist with extensive familiarity and expertise in the same exact procedure and technique.

## 2023-02-22 NOTE — Patient Instructions (Incomplete)

## 2023-02-23 ENCOUNTER — Ambulatory Visit
Admission: RE | Admit: 2023-02-23 | Discharge: 2023-02-23 | Disposition: A | Discharge status: Home / Self Care | Payer: No Typology Code available for payment source | Source: Ambulatory Visit | Attending: Pain Medicine | Admitting: Pain Medicine

## 2023-02-23 ENCOUNTER — Encounter: Payer: Self-pay | Admitting: Pain Medicine

## 2023-02-23 ENCOUNTER — Ambulatory Visit: Payer: No Typology Code available for payment source | Attending: Pain Medicine | Admitting: Pain Medicine

## 2023-02-23 VITALS — BP 127/95 | HR 62 | Temp 97.7°F | Resp 17 | Ht 70.0 in | Wt 255.0 lb

## 2023-02-23 DIAGNOSIS — M9904 Segmental and somatic dysfunction of sacral region: Secondary | ICD-10-CM | POA: Diagnosis not present

## 2023-02-23 DIAGNOSIS — Z7901 Long term (current) use of anticoagulants: Secondary | ICD-10-CM | POA: Insufficient documentation

## 2023-02-23 DIAGNOSIS — G8929 Other chronic pain: Secondary | ICD-10-CM | POA: Diagnosis not present

## 2023-02-23 DIAGNOSIS — M545 Low back pain, unspecified: Secondary | ICD-10-CM | POA: Diagnosis not present

## 2023-02-23 DIAGNOSIS — M7918 Myalgia, other site: Secondary | ICD-10-CM

## 2023-02-23 DIAGNOSIS — M549 Dorsalgia, unspecified: Secondary | ICD-10-CM

## 2023-02-23 DIAGNOSIS — M5459 Other low back pain: Secondary | ICD-10-CM

## 2023-02-23 DIAGNOSIS — M533 Sacrococcygeal disorders, not elsewhere classified: Secondary | ICD-10-CM

## 2023-02-23 DIAGNOSIS — M47898 Other spondylosis, sacral and sacrococcygeal region: Secondary | ICD-10-CM

## 2023-02-23 MED ORDER — ROPIVACAINE HCL 2 MG/ML IJ SOLN
4.0000 mL | Freq: Once | INTRAMUSCULAR | Status: AC
  Administered 2023-02-23: 4 mL

## 2023-02-23 MED ORDER — LIDOCAINE HCL 2 % IJ SOLN
20.0000 mL | Freq: Once | INTRAMUSCULAR | Status: AC
  Administered 2023-02-23: 400 mg

## 2023-02-23 MED ORDER — LIDOCAINE HCL 2 % IJ SOLN
INTRAMUSCULAR | Status: AC
  Filled 2023-02-23: qty 20

## 2023-02-23 MED ORDER — MIDAZOLAM HCL 5 MG/5ML IJ SOLN: 0.5000 mg | Freq: Once | INTRAMUSCULAR | Status: DC

## 2023-02-23 MED ORDER — PENTAFLUOROPROP-TETRAFLUOROETH EX AERO
INHALATION_SPRAY | Freq: Once | CUTANEOUS | Status: AC
  Administered 2023-02-23: 30 via TOPICAL

## 2023-02-23 MED ORDER — ROPIVACAINE HCL 2 MG/ML IJ SOLN
9.0000 mL | Freq: Once | INTRAMUSCULAR | Status: AC
  Administered 2023-02-23: 9 mL via INTRA_ARTICULAR

## 2023-02-23 MED ORDER — LACTATED RINGERS IV SOLN: Freq: Once | INTRAVENOUS | Status: DC

## 2023-02-23 MED ORDER — ROPIVACAINE HCL 2 MG/ML IJ SOLN
INTRAMUSCULAR | Status: AC
  Filled 2023-02-23: qty 20

## 2023-02-23 MED ORDER — FENTANYL CITRATE (PF) 100 MCG/2ML IJ SOLN
INTRAMUSCULAR | Status: AC
  Filled 2023-02-23: qty 2

## 2023-02-23 MED ORDER — FENTANYL CITRATE (PF) 100 MCG/2ML IJ SOLN: 25.0000 ug | INTRAMUSCULAR | Status: DC | PRN

## 2023-02-23 MED ORDER — MIDAZOLAM HCL 5 MG/5ML IJ SOLN
INTRAMUSCULAR | Status: AC
  Filled 2023-02-23: qty 5

## 2023-02-23 MED ORDER — METHYLPREDNISOLONE ACETATE 80 MG/ML IJ SUSP
80.0000 mg | Freq: Once | INTRAMUSCULAR | Status: AC
  Administered 2023-02-23: 80 mg via INTRA_ARTICULAR

## 2023-02-23 MED ORDER — TRIAMCINOLONE ACETONIDE 40 MG/ML IJ SUSP
40.0000 mg | Freq: Once | INTRAMUSCULAR | Status: AC
  Administered 2023-02-23: 40 mg

## 2023-02-23 MED ORDER — METHYLPREDNISOLONE ACETATE 80 MG/ML IJ SUSP
INTRAMUSCULAR | Status: AC
  Filled 2023-02-23: qty 1

## 2023-02-23 MED ORDER — TRIAMCINOLONE ACETONIDE 40 MG/ML IJ SUSP
INTRAMUSCULAR | Status: AC
  Filled 2023-02-23: qty 1

## 2023-02-23 NOTE — Progress Notes (Signed)
Safety precautions to be maintained throughout the outpatient stay will include: orient to surroundings, keep bed in low position, maintain call bell within reach at all times, provide assistance with transfer out of bed and ambulation.  

## 2023-02-24 ENCOUNTER — Telehealth: Payer: Self-pay | Admitting: *Deleted

## 2023-02-24 NOTE — Telephone Encounter (Signed)
Attempted to call for post procedure follow-up. No answer, mailbox full.

## 2023-03-09 ENCOUNTER — Ambulatory Visit: Payer: No Typology Code available for payment source | Admitting: Pain Medicine

## 2023-03-09 DIAGNOSIS — Z91199 Patient's noncompliance with other medical treatment and regimen due to unspecified reason: Secondary | ICD-10-CM

## 2023-03-09 DIAGNOSIS — Z09 Encounter for follow-up examination after completed treatment for conditions other than malignant neoplasm: Secondary | ICD-10-CM

## 2023-03-09 NOTE — Progress Notes (Signed)
(  03/09/2023) Patient rescheduled today. Says he will not be able to keep today's appointment and wanted to reschedule.

## 2023-03-21 ENCOUNTER — Encounter: Payer: Self-pay | Admitting: Pain Medicine

## 2023-03-21 ENCOUNTER — Ambulatory Visit: Payer: No Typology Code available for payment source | Attending: Pain Medicine | Admitting: Pain Medicine

## 2023-03-21 VITALS — BP 122/74 | HR 70 | Temp 98.1°F | Resp 18 | Ht 70.5 in | Wt 255.0 lb

## 2023-03-21 DIAGNOSIS — M47817 Spondylosis without myelopathy or radiculopathy, lumbosacral region: Secondary | ICD-10-CM | POA: Diagnosis not present

## 2023-03-21 DIAGNOSIS — M79662 Pain in left lower leg: Secondary | ICD-10-CM | POA: Diagnosis not present

## 2023-03-21 DIAGNOSIS — M533 Sacrococcygeal disorders, not elsewhere classified: Secondary | ICD-10-CM | POA: Insufficient documentation

## 2023-03-21 DIAGNOSIS — M109 Gout, unspecified: Secondary | ICD-10-CM | POA: Diagnosis not present

## 2023-03-21 DIAGNOSIS — M4316 Spondylolisthesis, lumbar region: Secondary | ICD-10-CM | POA: Diagnosis not present

## 2023-03-21 DIAGNOSIS — M51379 Other intervertebral disc degeneration, lumbosacral region without mention of lumbar back pain or lower extremity pain: Secondary | ICD-10-CM | POA: Diagnosis not present

## 2023-03-21 DIAGNOSIS — M19072 Primary osteoarthritis, left ankle and foot: Secondary | ICD-10-CM | POA: Insufficient documentation

## 2023-03-21 DIAGNOSIS — M48061 Spinal stenosis, lumbar region without neurogenic claudication: Secondary | ICD-10-CM | POA: Insufficient documentation

## 2023-03-21 DIAGNOSIS — M5459 Other low back pain: Secondary | ICD-10-CM

## 2023-03-21 DIAGNOSIS — G8929 Other chronic pain: Secondary | ICD-10-CM | POA: Diagnosis present

## 2023-03-21 DIAGNOSIS — Z09 Encounter for follow-up examination after completed treatment for conditions other than malignant neoplasm: Secondary | ICD-10-CM | POA: Diagnosis not present

## 2023-03-21 DIAGNOSIS — R930 Abnormal findings on diagnostic imaging of skull and head, not elsewhere classified: Secondary | ICD-10-CM | POA: Diagnosis not present

## 2023-03-21 DIAGNOSIS — M160 Bilateral primary osteoarthritis of hip: Secondary | ICD-10-CM | POA: Diagnosis not present

## 2023-03-21 DIAGNOSIS — M25552 Pain in left hip: Secondary | ICD-10-CM | POA: Diagnosis not present

## 2023-03-21 DIAGNOSIS — Z7901 Long term (current) use of anticoagulants: Secondary | ICD-10-CM | POA: Insufficient documentation

## 2023-03-21 DIAGNOSIS — M47816 Spondylosis without myelopathy or radiculopathy, lumbar region: Secondary | ICD-10-CM | POA: Insufficient documentation

## 2023-03-21 DIAGNOSIS — M79661 Pain in right lower leg: Secondary | ICD-10-CM | POA: Insufficient documentation

## 2023-03-21 DIAGNOSIS — M545 Low back pain, unspecified: Secondary | ICD-10-CM | POA: Insufficient documentation

## 2023-03-21 DIAGNOSIS — M9904 Segmental and somatic dysfunction of sacral region: Secondary | ICD-10-CM | POA: Insufficient documentation

## 2023-03-21 DIAGNOSIS — M25551 Pain in right hip: Secondary | ICD-10-CM | POA: Diagnosis not present

## 2023-03-21 DIAGNOSIS — M51369 Other intervertebral disc degeneration, lumbar region without mention of lumbar back pain or lower extremity pain: Secondary | ICD-10-CM | POA: Insufficient documentation

## 2023-03-21 NOTE — Progress Notes (Signed)
PROVIDER NOTE: Information contained herein reflects review and annotations entered in association with encounter. Interpretation of such information and data should be left to medically-trained personnel. Information provided to patient can be located elsewhere in the medical record under "Patient Instructions". Document created using STT-dictation technology, any transcriptional errors that may result from process are unintentional.    Patient: [name] Whitaker  Service Category: E/M  Provider: Oswaldo Done, MD  DOB: 1951-04-20  DOS: 03/21/2023  Referring Provider: [facility], [facility]  MRN: [id number]  Specialty: Interventional Pain Management  PCP: [facility], [facility]  Type: Established Patient  Setting: Ambulatory outpatient    Location: Office  Delivery: Face-to-face     HPI  [name], a 72 y.o. year old male, is here today because of his Pain of both sacroiliac joints [M53.3]. Mr. Baranowski's primary complain today is Back Pain  Pertinent problems: Mr. 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, L4-5, L5-S1); Lumbar facet joint syndrome (Bilateral) (R>L); DDD (degenerative disc disease), lumbosacral; Chronic hip pain (Bilateral); Chronic low back pain (1ry area of Pain) (Bilateral) (R>L) w/o sciatica; Chronic sacroiliac joint pain (Bilateral) (R>L); Other intervertebral disc degeneration, lumbar region; Osteoarthritis of hip (Right); Arthritis of hip (Right); Osteoarthritis of hips (Bilateral); Somatic dysfunction of sacroiliac joints (Bilateral) (R>L); Other spondylosis, sacral and sacrococcygeal region; Acute postoperative pain; Migraine with aura, not intractable,  without status migrainosus; Primary osteoarthritis, left ankle and foot; Osteoarthritis of left foot; Lumbosacral spondylosis without myelopathy; Spinal stenosis of lumbar region; Grade 1 Anterolisthesis (2mm) of lumbar spine of L4/L5; Statin myopathy; Chronic hip pain (Right); Lumbar facet joint pain; Chronic gouty arthritis; Trigger point with back pain; and Lumbar trigger point syndrome on their pertinent problem list. Pain Assessment: Severity of Chronic pain is reported as a 1 /10. Location: Back Right, Lower/Radaites from lower right back into right buttocks. Onset: More than a month ago. Quality: Dull. Timing: Intermittent. Modifying factor(s): Procedure. Vitals:  height is 5' 10.5" (1.791 m) and weight is 255 lb (115.7 kg). His temporal temperature is 98.1 F (36.7 C). His blood pressure is 122/74 and his pulse is 70. His respiration is 18 and oxygen saturation is 100%.  BMI: Estimated body mass index is 36.07 kg/m as calculated from the following:   Height as of this encounter: 5' 10.5" (1.791 m).   Weight as of this encounter: 255 lb (115.7 kg). Last encounter: 03/09/2023. Last procedure: 02/23/2023.  Reason for encounter: post-procedure evaluation and assessment.  The patient indicates having attained 100% relief of the pain that has continued since the trigger point injection.  He is extremely happy.  Currently he indicates not needing anything else and therefore we will see him on a as needed basis.  He was encouraged to give [place] a call if the pain returns.  He understood and accepted.  Post-procedure evaluation   Type:  Right buttocks Trigger Point Injection (Myoneural Block) (1-2 muscle groups)  #1 (w/ steroids)  CPT: 20552 Laterality: Right (-RT)   Imaging: Fluoroscopy-guided (Assisted) Non-spinal ([id number]) Anesthesia: Local anesthesia (1-2% Lidocaine) Anxiolysis: None                 Sedation: No Sedation  DOS: 02/23/2023  Performed by: Oswaldo Done, MD  Medical Necessity (reasoning)  Purpose: Diagnostic/Therapeutic Rationale (medical necessity): procedure needed and proper for the diagnosis and/or treatment of Mr. Sroka's medical symptoms and needs. Indications: Right low back and buttocks pain severe enough to impact quality of life and/or function. 1. Chronic low back pain (1ry area of Pain) (Bilateral) (R>L) w/o sciatica   2. Trigger point with back pain   3. Lumbar trigger point syndrome   4. Chronic anticoagulation (Xarelto)    NAS-11 Pain score:   Pre-procedure: 7 /10   Post-procedure: 0-No pain/10       Effectiveness:  Initial hour after procedure: 100 %. Subsequent 4-6 hours post-procedure: 100 %. Analgesia past initial 6 hours: 100 %. Ongoing improvement:  Analgesic: The patient indicates having attained an ongoing 100% relief of the pain. Function: Mr. Kaseman reports improvement in function ROM: Mr. Flores reports improvement in ROM  Pharmacotherapy Assessment  Analgesic: No opioid analgesics prescribed by our practice. Highest recorded MME/day: 26.67 mg/day MME/day: 0 mg/day   Monitoring: [place] PMP: PDMP reviewed during this encounter.       Pharmacotherapy: No side-effects or adverse reactions reported. Compliance: No problems identified. Effectiveness: Clinically acceptable.  Earlyne Iba, RN  03/21/2023  1:42 PM  Sign when Signing Visit Safety precautions to be maintained throughout the outpatient stay will include: orient to surroundings, keep bed in low position, maintain call bell within reach at all times, provide assistance with transfer out of bed and ambulation.     No results found for: "CBDTHCR" No results found for: "D8THCCBX" No results found for: "D9THCCBX"  UDS:  No results found for: "SUMMARY"    ROS  Constitutional: Denies any fever or chills Gastrointestinal: No reported hemesis, hematochezia, vomiting, or acute GI distress Musculoskeletal: Denies any acute onset joint  swelling, redness, loss of ROM, or weakness Neurological: No reported episodes of acute onset apraxia, aphasia, dysarthria, agnosia, amnesia, paralysis, loss of coordination, or loss of consciousness  Medication Review  Cholecalciferol, Cyanocobalamin, Dulaglutide, EPINEPHrine, acetaminophen, allopurinol, ascorbic acid, candesartan, chlorthalidone, cyclobenzaprine, famotidine, fexofenadine, finasteride, fluticasone, furosemide, gabapentin, hydrocortisone, ketoconazole, lidocaine, potassium chloride SA, propranolol, rivaroxaban, rosuvastatin, topiramate, and traMADol  History Review  Allergy: Mr. Sane is allergic to penicillins, amlodipine, amlodipine besylate, atorvastatin, erenumab-aooe, lisinopril, penicillin g, and simvastatin. Drug: Mr. Snellen  reports no history of drug use. Alcohol:  reports no history of alcohol use. Tobacco:  reports that he quit smoking about 39 years ago. His smoking use included pipe. He has never used smokeless tobacco. Social: Mr. Sweda  reports that he quit smoking about 39 years ago. His smoking use included pipe. He has never used smokeless tobacco. He reports that he does not drink alcohol and does not use drugs. Medical:  has a past medical history of Abnormal ECG (09/27/2013), Chest pain (09/27/2013), Dilated aortic root (HCC) (09/27/2013), DVT, lower extremity (HCC) (10/27/2011), Dyspnea (06/27/2014), GERD (gastroesophageal reflux disease), Gout, History of DVT (deep vein thrombosis) (04/14/2019), History of pulmonary embolism (04/14/2019), HTN (hypertension) (10/25/2011), Hypercholesteremia, Migraine, OSA (obstructive sleep apnea), and Pre-diabetes. Surgical: Mr. Castor  has a past surgical history that includes Appendectomy; Nasal septum surgery; Tonsillectomy; Testicle surgery; Cardiac catheterization; Back surgery; Cataract extraction w/PHACO (Right, 10/20/2020); and Cataract extraction w/PHACO (Left, 11/03/2020). Family: family history includes Clotting disorder  in his brother; Hypertension in his father and mother.  Laboratory Chemistry Profile   Renal Lab Results  Component Value Date   BUN 6 (L) 07/18/2022   CREATININE 1.21 07/18/2022  BCR 9 (L) 02/25/2021   GFRAA 62 04/15/2019   GFRNONAA >60 07/18/2022    Hepatic Lab Results  Component Value Date   AST 20 07/18/2022   ALT 20 07/18/2022   ALBUMIN 3.7 07/18/2022   ALKPHOS 55 07/18/2022   LIPASE 19 03/15/2007    Electrolytes Lab Results  Component Value Date   NA 140 07/18/2022   K 3.4 (L) 07/18/2022   CL 103 07/18/2022   CALCIUM 9.0 07/18/2022   MG 2.2 04/15/2019    Bone Lab Results  Component Value Date   25OHVITD1 43 04/15/2019   25OHVITD2 <1.0 04/15/2019   25OHVITD3 43 04/15/2019    Inflammation (CRP: Acute Phase) (ESR: Chronic Phase) Lab Results  Component Value Date   CRP 2 04/15/2019   ESRSEDRATE 6 04/15/2019         Note: Above Lab results reviewed.  Recent Imaging Review  DG PAIN CLINIC C-ARM 1-60 MIN NO REPORT Fluoro was used, but no Radiologist interpretation will be provided.  Please refer to "NOTES" tab for provider progress note. Note: Reviewed        Physical Exam  General appearance: Well nourished, well developed, and well hydrated. In no apparent acute distress Mental status: Alert, oriented x 3 (person, place, & time)       Respiratory: No evidence of acute respiratory distress Eyes: PERLA Vitals: BP 122/74   Pulse 70   Temp 98.1 F (36.7 C) (Temporal)   Resp 18   Ht 5' 10.5" (1.791 m)   Wt 255 lb (115.7 kg)   SpO2 100%   BMI 36.07 kg/m  BMI: Estimated body mass index is 36.07 kg/m as calculated from the following:   Height as of this encounter: 5' 10.5" (1.791 m).   Weight as of this encounter: 255 lb (115.7 kg). Ideal: Ideal body weight: 74.1 kg (163 lb 7.5 oz) Adjusted ideal body weight: 90.8 kg (200 lb 1.3 oz)  Assessment   Diagnosis Status  1. Chronic sacroiliac joint pain (Bilateral) (R>L)   2. Chronic low back pain (1ry  area of Pain) (Bilateral) (R>L) w/o sciatica   3. Lumbar trigger point syndrome   4. Postop check    Controlled Controlled Controlled   Updated Problems: No problems updated.  Plan of Care  Problem-specific:  No problem-specific Assessment & Plan notes found for this encounter.  Mr. Dove Peoples has a current medication list which includes the following long-term medication(s): allopurinol, candesartan, chlorthalidone, famotidine, fexofenadine, furosemide, potassium chloride sa, rosuvastatin, and topiramate.  Pharmacotherapy (Medications Ordered): No orders of the defined types were placed in this encounter.  Orders:  Orders Placed This Encounter  Procedures   Nursing Instructions:    Please complete this patient's postprocedure evaluation.    Scheduling Instructions:     Please complete this patient's postprocedure evaluation.   Follow-up plan:   Return if symptoms worsen or fail to improve.      Interventional Therapies  Risk factors  Considerations:   NOTE: Xarelto Anticoagulation (Stop: 3 days  Restart: 6 hours) HTN   Planned  Pending:   Therapeutic left lumbar facet RFA #3    Under consideration:   Possible right opturator + femoral NB  Possible right opturator + femoral nerve RFA    Completed:   Therapeutic right IA hip joint inj. x2 (04/30/2019) (100/100/50/90-100) Diagnostic bilateral SI joint Blk x2 (03/10/2020) (50/50/100 x 5 wks/0)  Diagnostic/therapeutic right SI joint Blk x3 (12/15/2022) (100/100/0/0)  Diagnostic bilateral lumbar facet MBB x1 (05/07/2020) (100/100/100 x1  week/>75)  Therapeutic left lumbar facet RFA x3 (06/16/2022) (100/100/100/100)  Therapeutic right lumbar facet RFA x3 (05/24/2022) (100/100/80/80) (by 06/16/2022 it had progressed to 100% ongoing relief)   Palliative options:   Palliative right IA hip joint inj.  Palliative bilateral SI joint Blk  Therapeutic/palliative lumbar facet RFA        Recent Visits Date Type  Provider Dept  02/23/23 Procedure visit Delano Metz, MD [facility]  02/07/23 Office Visit Delano Metz, MD [facility]  01/17/23 Procedure visit Delano Metz, MD [facility]  01/05/23 Procedure visit Edward Jolly, MD [facility]  12/27/22 Office Visit Delano Metz, MD [facility]  Showing recent visits within past 90 days and meeting all other requirements Today's Visits Date Type Provider Dept  03/21/23 Office Visit Delano Metz, MD [facility]  Showing today's visits and meeting all other requirements Future Appointments No visits were found meeting these conditions. Showing future appointments within next 90 days and meeting all other requirements  I discussed the assessment and treatment plan with the patient. The patient was provided an opportunity to ask questions and all were answered. The patient agreed with the plan and demonstrated an understanding of the instructions.  Patient advised to call back or seek an in-person evaluation if the symptoms or condition worsens.  Duration of encounter: 15 minutes.  Total time on encounter, as per AMA guidelines included both the face-to-face and non-face-to-face time personally spent by the physician and/or other qualified health care professional(s) on the day of the encounter (includes time in activities that require the physician or other qualified health care professional and does not include time in activities normally performed by clinical staff). Physician's time may include the following activities when performed: Preparing to see the patient (e.g., pre-charting review of records, searching for previously ordered imaging, lab work, and nerve conduction tests) Review of prior analgesic pharmacotherapies. Reviewing PMP Interpreting ordered tests (e.g., lab work, imaging, nerve conduction tests) Performing post-procedure evaluations, including  interpretation of diagnostic procedures Obtaining and/or reviewing separately obtained history Performing a medically appropriate examination and/or evaluation Counseling and educating the patient/family/caregiver Ordering medications, tests, or procedures Referring and communicating with other health care professionals (when not separately reported) Documenting clinical information in the electronic or other health record Independently interpreting results (not separately reported) and communicating results to the patient/ family/caregiver Care coordination (not separately reported)  Note by: Oswaldo Done, MD Date: 03/21/2023; Time: 1:52 PM

## 2023-03-21 NOTE — Progress Notes (Signed)
Safety precautions to be maintained throughout the outpatient stay will include: orient to surroundings, keep bed in low position, maintain call bell within reach at all times, provide assistance with transfer out of bed and ambulation.  

## 2023-03-23 DIAGNOSIS — D352 Benign neoplasm of pituitary gland: Secondary | ICD-10-CM | POA: Diagnosis not present

## 2023-03-23 DIAGNOSIS — N1832 Chronic kidney disease, stage 3b: Secondary | ICD-10-CM | POA: Diagnosis not present

## 2023-03-23 DIAGNOSIS — E1122 Type 2 diabetes mellitus with diabetic chronic kidney disease: Secondary | ICD-10-CM | POA: Diagnosis not present

## 2023-03-23 DIAGNOSIS — I2699 Other pulmonary embolism without acute cor pulmonale: Secondary | ICD-10-CM | POA: Diagnosis not present

## 2023-03-23 DIAGNOSIS — I129 Hypertensive chronic kidney disease with stage 1 through stage 4 chronic kidney disease, or unspecified chronic kidney disease: Secondary | ICD-10-CM | POA: Diagnosis not present

## 2023-03-28 DIAGNOSIS — E1122 Type 2 diabetes mellitus with diabetic chronic kidney disease: Secondary | ICD-10-CM | POA: Diagnosis not present

## 2023-03-28 DIAGNOSIS — D352 Benign neoplasm of pituitary gland: Secondary | ICD-10-CM | POA: Diagnosis not present

## 2023-03-28 DIAGNOSIS — E221 Hyperprolactinemia: Secondary | ICD-10-CM | POA: Diagnosis not present

## 2023-03-28 DIAGNOSIS — E2749 Other adrenocortical insufficiency: Secondary | ICD-10-CM | POA: Diagnosis not present

## 2023-03-28 DIAGNOSIS — N1832 Chronic kidney disease, stage 3b: Secondary | ICD-10-CM | POA: Diagnosis not present

## 2023-04-18 NOTE — Progress Notes (Unsigned)
PROVIDER NOTE: Information contained herein reflects review and annotations entered in association with encounter. Interpretation of such information and data should be left to medically-trained personnel. Information provided to patient can be located elsewhere in the medical record under "Patient Instructions". Document created using STT-dictation technology, any transcriptional errors that may result from process are unintentional.    Patient: [name] Whitaker  Service Category: E/M  Provider: Oswaldo Done, MD  DOB: 07-Feb-1951  DOS: 04/19/2023  Referring Provider: [facility]  MRN: [id number]  Specialty: Interventional Pain Management  PCP: [facility], [facility]  Type: Established Patient  Setting: Ambulatory outpatient    Location: Office  Delivery: Face-to-face     HPI  Mr. Gabriella Vandenbosch, a 72 y.o. year old male, is here today because of his No primary diagnosis found.. Mr. Howington's primary complain today is No chief complaint on file.  Pertinent problems: Mr. 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, L4-5, L5-S1); Lumbar facet joint syndrome (Bilateral) (R>L); DDD (degenerative disc disease), lumbosacral; Chronic hip pain (Bilateral); Chronic low back pain (1ry area of Pain) (Bilateral) (R>L) w/o sciatica; Chronic sacroiliac joint pain (Bilateral) (R>L); Other intervertebral disc degeneration, lumbar region; Osteoarthritis of hip (Right); Arthritis of hip (Right); Osteoarthritis of hips (Bilateral); Somatic dysfunction of sacroiliac joints (Bilateral) (R>L); Other spondylosis, sacral and sacrococcygeal region; Acute postoperative pain; Migraine with aura, not  intractable, without status migrainosus; Primary osteoarthritis, left ankle and foot; Osteoarthritis of left foot; Lumbosacral spondylosis without myelopathy; Spinal stenosis of lumbar region; Grade 1 Anterolisthesis (2mm) of lumbar spine of L4/L5; Statin myopathy; Chronic hip pain (Right); Lumbar facet joint pain; Chronic gouty arthritis; Trigger point with back pain; and Lumbar trigger point syndrome on their pertinent problem list. Pain Assessment: Severity of   is reported as a  /10. Location:    / . Onset:  . Quality:  . Timing:  . Modifying factor(s):  Marland Kitchen Vitals:  vitals were not taken for this visit.  BMI: Estimated body mass index is 36.07 kg/m as calculated from the following:   Height as of 03/21/23: 5' 10.5" (1.791 m).   Weight as of 03/21/23: 255 lb (115.7 kg). Last encounter: 03/21/2023. Last procedure: 02/23/2023.  Reason for encounter:  *** . *** Discussed the use of AI scribe software for clinical note transcription with the patient, who gave verbal consent to proceed.  History of Present Illness          *** Pharmacotherapy Assessment  Analgesic: No opioid analgesics prescribed by our practice. Highest recorded MME/day: 26.67 mg/day MME/day: 0 mg/day   Monitoring: [place] PMP: PDMP reviewed during this encounter.       Pharmacotherapy: No side-effects or adverse reactions reported. Compliance: No problems identified. Effectiveness: Clinically acceptable.  No notes on file  No results found for: "CBDTHCR" No results found for: "D8THCCBX" No results found for: "D9THCCBX"  UDS:  No results found for: "SUMMARY"    ROS  Constitutional: Denies any fever or chills Gastrointestinal: No reported hemesis, hematochezia, vomiting, or acute GI distress Musculoskeletal: Denies any acute onset joint swelling, redness, loss of ROM, or weakness Neurological: No reported episodes of acute onset apraxia, aphasia, dysarthria, agnosia, amnesia, paralysis, loss of coordination, or loss  of consciousness  Medication Review  Cholecalciferol, Cyanocobalamin, Dulaglutide, EPINEPHrine, acetaminophen, allopurinol,  ascorbic acid, candesartan, chlorthalidone, cyclobenzaprine, famotidine, fexofenadine, finasteride, fluticasone, furosemide, gabapentin, hydrocortisone, ketoconazole, lidocaine, potassium chloride SA, propranolol, rivaroxaban, rosuvastatin, topiramate, and traMADol  History Review  Allergy: Mr. Kosakowski is allergic to penicillins, amlodipine, amlodipine besylate, atorvastatin, erenumab-aooe, lisinopril, penicillin g, and simvastatin. Drug: Mr. Romack  reports no history of drug use. Alcohol:  reports no history of alcohol use. Tobacco:  reports that he quit smoking about 39 years ago. His smoking use included pipe. He has never used smokeless tobacco. Social: Mr. Kempen  reports that he quit smoking about 39 years ago. His smoking use included pipe. He has never used smokeless tobacco. He reports that he does not drink alcohol and does not use drugs. Medical:  has a past medical history of Abnormal ECG (09/27/2013), Chest pain (09/27/2013), Dilated aortic root (HCC) (09/27/2013), DVT, lower extremity (HCC) (10/27/2011), Dyspnea (06/27/2014), GERD (gastroesophageal reflux disease), Gout, History of DVT (deep vein thrombosis) (04/14/2019), History of pulmonary embolism (04/14/2019), HTN (hypertension) (10/25/2011), Hypercholesteremia, Migraine, OSA (obstructive sleep apnea), and Pre-diabetes. Surgical: Mr. Mcnerney  has a past surgical history that includes Appendectomy; Nasal septum surgery; Tonsillectomy; Testicle surgery; Cardiac catheterization; Back surgery; Cataract extraction w/PHACO (Right, 10/20/2020); and Cataract extraction w/PHACO (Left, 11/03/2020). Family: family history includes Clotting disorder in his brother; Hypertension in his father and mother.  Laboratory Chemistry Profile   Renal Lab Results  Component Value Date   BUN 6 (L) 07/18/2022   CREATININE 1.21  07/18/2022   BCR 9 (L) 02/25/2021   GFRAA 62 04/15/2019   GFRNONAA >60 07/18/2022    Hepatic Lab Results  Component Value Date   AST 20 07/18/2022   ALT 20 07/18/2022   ALBUMIN 3.7 07/18/2022   ALKPHOS 55 07/18/2022   LIPASE 19 03/15/2007    Electrolytes Lab Results  Component Value Date   NA 140 07/18/2022   K 3.4 (L) 07/18/2022   CL 103 07/18/2022   CALCIUM 9.0 07/18/2022   MG 2.2 04/15/2019    Bone Lab Results  Component Value Date   25OHVITD1 43 04/15/2019   25OHVITD2 <1.0 04/15/2019   25OHVITD3 43 04/15/2019    Inflammation (CRP: Acute Phase) (ESR: Chronic Phase) Lab Results  Component Value Date   CRP 2 04/15/2019   ESRSEDRATE 6 04/15/2019         Note: Above Lab results reviewed.  Recent Imaging Review  DG PAIN CLINIC C-ARM 1-60 MIN NO REPORT Fluoro was used, but no Radiologist interpretation will be provided.  Please refer to "NOTES" tab for provider progress note. Note: Reviewed        Physical Exam  General appearance: Well nourished, well developed, and well hydrated. In no apparent acute distress Mental status: Alert, oriented x 3 (person, place, & time)       Respiratory: No evidence of acute respiratory distress Eyes: PERLA Vitals: There were no vitals taken for this visit. BMI: Estimated body mass index is 36.07 kg/m as calculated from the following:   Height as of 03/21/23: 5' 10.5" (1.791 m).   Weight as of 03/21/23: 255 lb (115.7 kg). Ideal: Patient weight not recorded  Assessment   Diagnosis Status  No diagnosis found. Controlled Controlled Controlled   Updated Problems: No problems updated.  Plan of Care  Problem-specific:  Assessment and Plan            Mr. Cardae Sapp has a current medication list which includes the following long-term medication(s): allopurinol, candesartan, chlorthalidone, famotidine, fexofenadine, furosemide, potassium chloride sa, rosuvastatin, and topiramate.  Pharmacotherapy (Medications  Ordered): No orders of the defined types were placed in this encounter.  Orders:  No orders of the defined types were placed in this encounter.  Follow-up plan:   No follow-ups on file.      Interventional Therapies  Risk factors  Considerations:   NOTE: Xarelto Anticoagulation (Stop: 3 days  Restart: 6 hours) HTN   Planned  Pending:   Therapeutic left lumbar facet RFA #3    Under consideration:   Possible right opturator + femoral NB  Possible right opturator + femoral nerve RFA    Completed:   Therapeutic right IA hip joint inj. x2 (04/30/2019) (100/100/50/90-100) Diagnostic bilateral SI joint Blk x2 (03/10/2020) (50/50/100 x 5 wks/0)  Diagnostic/therapeutic right SI joint Blk x3 (12/15/2022) (100/100/0/0)  Diagnostic bilateral lumbar facet MBB x1 (05/07/2020) (100/100/100 x1 week/>75)  Therapeutic left lumbar facet RFA x3 (06/16/2022) (100/100/100/100)  Therapeutic right lumbar facet RFA x3 (05/24/2022) (100/100/80/80) (by 06/16/2022 it had progressed to 100% ongoing relief)   Palliative options:   Palliative right IA hip joint inj.  Palliative bilateral SI joint Blk  Therapeutic/palliative lumbar facet RFA         Recent Visits Date Type Provider Dept  03/21/23 Office Visit Delano Metz, MD [facility]  02/23/23 Procedure visit Delano Metz, MD [facility]  02/07/23 Office Visit Delano Metz, MD [facility]  Showing recent visits within past 90 days and meeting all other requirements Future Appointments Date Type Provider Dept  04/19/23 Appointment Delano Metz, MD [facility]  Showing future appointments within next 90 days and meeting all other requirements  I discussed the assessment and treatment plan with the patient. The patient was provided an opportunity to ask questions and all were answered. The patient agreed with the plan and demonstrated an understanding of the instructions.  Patient  advised to call back or seek an in-person evaluation if the symptoms or condition worsens.  Duration of encounter: *** minutes.  Total time on encounter, as per AMA guidelines included both the face-to-face and non-face-to-face time personally spent by the physician and/or other qualified health care professional(s) on the day of the encounter (includes time in activities that require the physician or other qualified health care professional and does not include time in activities normally performed by clinical staff). Physician's time may include the following activities when performed: Preparing to see the patient (e.g., pre-charting review of records, searching for previously ordered imaging, lab work, and nerve conduction tests) Review of prior analgesic pharmacotherapies. Reviewing PMP Interpreting ordered tests (e.g., lab work, imaging, nerve conduction tests) Performing post-procedure evaluations, including interpretation of diagnostic procedures Obtaining and/or reviewing separately obtained history Performing a medically appropriate examination and/or evaluation Counseling and educating the patient/family/caregiver Ordering medications, tests, or procedures Referring and communicating with other health care professionals (when not separately reported) Documenting clinical information in the electronic or other health record Independently interpreting results (not separately reported) and communicating results to the patient/ family/caregiver Care coordination (not separately reported)  Note by: Oswaldo Done, MD Date: 04/19/2023; Time: 8:19 AM

## 2023-04-19 ENCOUNTER — Encounter: Payer: Self-pay | Admitting: Pain Medicine

## 2023-04-19 ENCOUNTER — Ambulatory Visit: Payer: No Typology Code available for payment source | Attending: Pain Medicine | Admitting: Pain Medicine

## 2023-04-19 VITALS — BP 130/82 | HR 81 | Temp 97.3°F | Resp 18 | Ht 71.0 in | Wt 260.0 lb

## 2023-04-19 DIAGNOSIS — Z7901 Long term (current) use of anticoagulants: Secondary | ICD-10-CM | POA: Diagnosis not present

## 2023-04-19 DIAGNOSIS — M25552 Pain in left hip: Secondary | ICD-10-CM | POA: Diagnosis not present

## 2023-04-19 DIAGNOSIS — M4316 Spondylolisthesis, lumbar region: Secondary | ICD-10-CM | POA: Insufficient documentation

## 2023-04-19 DIAGNOSIS — M51372 Other intervertebral disc degeneration, lumbosacral region with discogenic back pain and lower extremity pain: Secondary | ICD-10-CM | POA: Insufficient documentation

## 2023-04-19 DIAGNOSIS — M1A9XX0 Chronic gout, unspecified, without tophus (tophi): Secondary | ICD-10-CM | POA: Insufficient documentation

## 2023-04-19 DIAGNOSIS — N1832 Chronic kidney disease, stage 3b: Secondary | ICD-10-CM | POA: Insufficient documentation

## 2023-04-19 DIAGNOSIS — M160 Bilateral primary osteoarthritis of hip: Secondary | ICD-10-CM | POA: Insufficient documentation

## 2023-04-19 DIAGNOSIS — M47816 Spondylosis without myelopathy or radiculopathy, lumbar region: Secondary | ICD-10-CM | POA: Insufficient documentation

## 2023-04-19 DIAGNOSIS — G8929 Other chronic pain: Secondary | ICD-10-CM | POA: Insufficient documentation

## 2023-04-19 DIAGNOSIS — M25551 Pain in right hip: Secondary | ICD-10-CM | POA: Diagnosis not present

## 2023-04-19 DIAGNOSIS — R937 Abnormal findings on diagnostic imaging of other parts of musculoskeletal system: Secondary | ICD-10-CM | POA: Insufficient documentation

## 2023-04-19 DIAGNOSIS — Z79899 Other long term (current) drug therapy: Secondary | ICD-10-CM | POA: Diagnosis not present

## 2023-04-19 DIAGNOSIS — M48061 Spinal stenosis, lumbar region without neurogenic claudication: Secondary | ICD-10-CM | POA: Insufficient documentation

## 2023-04-19 DIAGNOSIS — M545 Low back pain, unspecified: Secondary | ICD-10-CM | POA: Diagnosis present

## 2023-04-19 DIAGNOSIS — M5459 Other low back pain: Secondary | ICD-10-CM

## 2023-04-19 NOTE — Progress Notes (Signed)
Safety precautions to be maintained throughout the outpatient stay will include: orient to surroundings, keep bed in low position, maintain call bell within reach at all times, provide assistance with transfer out of bed and ambulation.  

## 2023-04-19 NOTE — Patient Instructions (Addendum)
Preparing for your procedure (without sedation) Instructions: Oral Intake: Do not eat or drink anything for at least 3 hours prior to your procedure. Transportation: Unless otherwise stated by your physician, you may drive yourself after the procedure. Blood Pressure Medicine: Take your blood pressure medicine with a sip of water the morning of the procedure. Insulin: Take only  of your normal insulin dose. Preventing infections: Shower with an antibacterial soap the morning of your procedure. Build-up your immune system: Take 1000 mg of Vitamin C with every meal (3 times a day) the day prior to your procedure. Pregnancy: If you are pregnant, call and cancel the procedure. Sickness: If you have a cold, fever, or any active infections, call and cancel the procedure. Arrival: You must be in the facility at least 30 minutes prior to your scheduled procedure. Children: Do not bring any children with you. Dress appropriately: Bring dark clothing that you would not mind if they get stained. Valuables: Do not bring any jewelry or valuables. Procedure appointments are reserved for interventional treatments only. No Prescription Refills. No medication changes will be discussed during procedure appointments. No disability issues will be discussed.  ______________________________________________________________________    Blood Thinners  IMPORTANT NOTICE:  If you take any of these, make sure to notify the nursing staff.  Failure to do so may result in serious injury.  Recommended time intervals to stop and restart blood-thinners, before & after invasive procedures  Generic Name Brand Name Pre-procedure: Stop medication for this amount of time before your procedure: Post-procedure: Wait this amount of time after the procedure before restarting your medication:  Abciximab Reopro 15 days 2 hrs  Alteplase Activase 10 days 10 days  Anagrelide Agrylin    Apixaban Eliquis 3 days 6 hrs  Cilostazol  Pletal 3 days 5 hrs  Clopidogrel Plavix 7-10 days 2 hrs  Dabigatran Pradaxa 5 days 6 hrs  Dalteparin Fragmin 24 hours 4 hrs  Dipyridamole Aggrenox 11days 2 hrs  Edoxaban Lixiana; Savaysa 3 days 2 hrs  Enoxaparin  Lovenox 24 hours 4 hrs  Eptifibatide Integrillin 8 hours 2 hrs  Fondaparinux  Arixtra 72 hours 12 hrs  Hydroxychloroquine Plaquenil 11 days   Prasugrel Effient 7-10 days 6 hrs  Reteplase Retavase 10 days 10 days  Rivaroxaban Xarelto 3 days 6 hrs  Ticagrelor Brilinta 5-7 days 6 hrs  Ticlopidine Ticlid 10-14 days 2 hrs  Tinzaparin Innohep 24 hours 4 hrs  Tirofiban Aggrastat 8 hours 2 hrs  Warfarin Coumadin 5 days 2 hrs   Other medications with blood-thinning effects  Product indications Generic (Brand) names Note  Cholesterol Lipitor Stop 4 days before procedure  Blood thinner (injectable) Heparin (LMW or LMWH Heparin) Stop 24 hours before procedure  Cancer Ibrutinib (Imbruvica) Stop 7 days before procedure  Malaria/Rheumatoid Hydroxychloroquine (Plaquenil) Stop 11 days before procedure  Thrombolytics  10 days before or after procedures   Over-the-counter (OTC) Products with blood-thinning effects  Product Common names Stop Time  Aspirin > 325 mg Goody Powders, Excedrin, etc. 11 days  Aspirin <= 81 mg  7 days  Fish oil  4 days  Garlic supplements  7 days  Ginkgo biloba  36 hours  Ginseng  24 hours  NSAIDs Ibuprofen, Naprosyn, etc. 3 days  Vitamin E  4 days   ______________________________________________________________________      ______________________________________________________________________    Procedure instructions  Stop blood-thinners  Do not eat or drink fluids (other than water) for 6 hours before your procedure  No water for 2 hours  before your procedure  Take your blood pressure medicine with a sip of water  Arrive 30 minutes before your appointment  If sedation is planned, bring suitable driver. Pennie Banter, Benedetto Goad, & public transportation are  NOT APPROVED)  Carefully read the "Preparing for your procedure" detailed instructions  If you have questions call us at [phone]  ______________________________________________________________________      ______________________________________________________________________    Preparing for your procedure  Appointments: If you think you may not be able to keep your appointment, call 24-48 hours in advance to cancel. We need time to make it available to others.  During your procedure appointment there will be: No Prescription Refills. No disability issues to discussed. No medication changes or discussions.  Instructions: Food intake: Avoid eating anything solid for at least 8 hours prior to your procedure. Clear liquid intake: You may take clear liquids such as water up to 2 hours prior to your procedure. (No carbonated drinks. No soda.) Transportation: Unless otherwise stated by your physician, bring a driver. (Driver cannot be a Market researcher, Pharmacist, community, or any other form of public transportation.) Morning Medicines: Except for blood thinners, take all of your other morning medications with a sip of water. Make sure to take your heart and blood pressure medicines. If your blood pressure's lower number is above 100, the case will be rescheduled. Blood thinners: Make sure to stop your blood thinners as instructed.  If you take a blood thinner, but were not instructed to stop it, call our office [phone] and ask to talk to a nurse. Not stopping a blood thinner prior to certain procedures could lead to serious complications. Diabetics on insulin: Notify the staff so that you can be scheduled 1st case in the morning. If your diabetes requires high dose insulin, take only  of your normal insulin dose the morning of the procedure and notify the staff that you have done so. Preventing infections: Shower with an antibacterial soap the morning of your procedure.  Build-up your immune system:  Take 1000 mg of Vitamin C with every meal (3 times a day) the day prior to your procedure. Antibiotics: Inform the nursing staff if you are taking any antibiotics or if you have any conditions that may require antibiotics prior to procedures. (Example: recent joint implants)   Pregnancy: If you are pregnant make sure to notify the nursing staff. Not doing so may result in injury to the fetus, including death.  Sickness: If you have a cold, fever, or any active infections, call and cancel or reschedule your procedure. Receiving steroids while having an infection may result in complications. Arrival: You must be in the facility at least 30 minutes prior to your scheduled procedure. Tardiness: Your scheduled time is also the cutoff time. If you do not arrive at least 15 minutes prior to your procedure, you will be rescheduled.  Children: Do not bring any children with you. Make arrangements to keep them home. Dress appropriately: There is always a possibility that your clothing may get soiled. Avoid long dresses. Valuables: Do not bring any jewelry or valuables.  Reasons to call and reschedule or cancel your procedure: (Following these recommendations will minimize the risk of a serious complication.) Surgeries: Avoid having procedures within 2 weeks of any surgery. (Avoid for 2 weeks before or after any surgery). Flu Shots: Avoid having procedures within 2 weeks of a flu shots or . (Avoid for 2 weeks before or after immunizations). Barium: Avoid having a procedure within 7-10 days after having  had a radiological study involving the use of radiological contrast. (Myelograms, Barium swallow or enema study). Heart attacks: Avoid any elective procedures or surgeries for the initial 6 months after a "Myocardial Infarction" (Heart Attack). Blood thinners: It is imperative that you stop these medications before procedures. Let us know if you if you take any blood thinner.  Infection: Avoid procedures during  or within two weeks of an infection (including chest colds or gastrointestinal problems). Symptoms associated with infections include: Localized redness, fever, chills, night sweats or profuse sweating, burning sensation when voiding, cough, congestion, stuffiness, runny nose, sore throat, diarrhea, nausea, vomiting, cold or Flu symptoms, recent or current infections. It is specially important if the infection is over the area that we intend to treat. Heart and lung problems: Symptoms that may suggest an active cardiopulmonary problem include: cough, chest pain, breathing difficulties or shortness of breath, dizziness, ankle swelling, uncontrolled high or unusually low blood pressure, and/or palpitations. If you are experiencing any of these symptoms, cancel your procedure and contact your primary care physician for an evaluation.  Remember:  Regular Business hours are:  Monday to Thursday 8:00 AM to 4:00 PM  Provider's Schedule: Delano Metz, MD:  Procedure days: Tuesday and Thursday 7:30 AM to 4:00 PM  Edward Jolly, MD:  Procedure days: Monday and Wednesday 7:30 AM to 4:00 PM Last  Updated: 01/10/2023 ______________________________________________________________________      ______________________________________________________________________    General Risks and Possible Complications  Patient Responsibilities: It is important that you read this as it is part of your informed consent. It is our duty to inform you of the risks and possible complications associated with treatments offered to you. It is your responsibility as a patient to read this and to ask questions about anything that is not clear or that you believe was not covered in this document.  Patient's Rights: You have the right to refuse treatment. You also have the right to change your mind, even after initially having agreed to have the treatment done. However, under this last option, if you wait until the last second to  change your mind, you may be charged for the materials used up to that point.  Introduction: Medicine is not an Visual merchandiser. Everything in Medicine, including the lack of treatment(s), carries the potential for danger, harm, or loss (which is by definition: Risk). In Medicine, a complication is a secondary problem, condition, or disease that can aggravate an already existing one. All treatments carry the risk of possible complications. The fact that a side effects or complications occurs, does not imply that the treatment was conducted incorrectly. It must be clearly understood that these can happen even when everything is done following the highest safety standards.  No treatment: You can choose not to proceed with the proposed treatment alternative. The "PRO(s)" would include: avoiding the risk of complications associated with the therapy. The "CON(s)" would include: not getting any of the treatment benefits. These benefits fall under one of three categories: diagnostic; therapeutic; and/or palliative. Diagnostic benefits include: getting information which can ultimately lead to improvement of the disease or symptom(s). Therapeutic benefits are those associated with the successful treatment of the disease. Finally, palliative benefits are those related to the decrease of the primary symptoms, without necessarily curing the condition (example: decreasing the pain from a flare-up of a chronic condition, such as incurable terminal cancer).  General Risks and Complications: These are associated to most interventional treatments. They can occur alone, or in combination. They fall  under one of the following six (6) categories: no benefit or worsening of symptoms; bleeding; infection; nerve damage; allergic reactions; and/or death. No benefits or worsening of symptoms: In Medicine there are no guarantees, only probabilities. No healthcare provider can ever guarantee that a medical treatment will work, they can  only state the probability that it may. Furthermore, there is always the possibility that the condition may worsen, either directly, or indirectly, as a consequence of the treatment. Bleeding: This is more common if the patient is taking a blood thinner, either prescription or over the counter (example: Goody Powders, Fish oil, Aspirin, Garlic, etc.), or if suffering a condition associated with impaired coagulation (example: Hemophilia, cirrhosis of the liver, low platelet counts, etc.). However, even if you do not have one on these, it can still happen. If you have any of these conditions, or take one of these drugs, make sure to notify your treating physician. Infection: This is more common in patients with a compromised immune system, either due to disease (example: diabetes, cancer, human immunodeficiency virus [HIV], etc.), or due to medications or treatments (example: therapies used to treat cancer and rheumatological diseases). However, even if you do not have one on these, it can still happen. If you have any of these conditions, or take one of these drugs, make sure to notify your treating physician. Nerve Damage: This is more common when the treatment is an invasive one, but it can also happen with the use of medications, such as those used in the treatment of cancer. The damage can occur to small secondary nerves, or to large primary ones, such as those in the spinal cord and brain. This damage may be temporary or permanent and it may lead to impairments that can range from temporary numbness to permanent paralysis and/or brain death. Allergic Reactions: Any time a substance or material comes in contact with our body, there is the possibility of an allergic reaction. These can range from a mild skin rash (contact dermatitis) to a severe systemic reaction (anaphylactic reaction), which can result in death. Death: In general, any medical intervention can result in death, most of the time due to an  unforeseen complication. ______________________________________________________________________

## 2023-04-26 DIAGNOSIS — M7061 Trochanteric bursitis, right hip: Secondary | ICD-10-CM | POA: Insufficient documentation

## 2023-04-26 NOTE — Patient Instructions (Signed)

## 2023-04-26 NOTE — Progress Notes (Unsigned)
PROVIDER NOTE: Interpretation of information contained herein should be left to medically-trained personnel. Specific patient instructions are provided elsewhere under "Patient Instructions" section of medical record. This document was created in part using STT-dictation technology, any transcriptional errors that may result from this process are unintentional.  Patient: [name] Whitaker Type: Established DOB: 09-01-1950 MRN: [id number] PCP: [facility]  Service: Procedure DOS: 04/27/2023 Setting: Ambulatory Location: Ambulatory outpatient facility Delivery: Face-to-face Provider: Oswaldo Done, MD Specialty: Interventional Pain Management Specialty designation: 09 Location: Outpatient facility Ref. Prov.: [facility]*       Interventional Therapy   Type: Hip & bursae injection #1  Laterality: Right (-RT)  Bursae: Trochanteric  Laterality: Right (-RT)  Approach: Percutaneous posterolateral approach. Level: Lower pelvic and hip joint level.  Imaging: Fluoroscopy-guided Non-spinal ([id number]) Anesthesia: Local anesthesia (1-2% Lidocaine) Anxiolysis: None                 Sedation: No Sedation                       DOS: 04/27/2023  Performed by: Oswaldo Done, MD  Purpose: Diagnostic/Therapeutic Indications: Hip pain severe enough to impact quality of life or function. Rationale (medical necessity): procedure needed and proper for the diagnosis and/or treatment of [name]'s medical symptoms and needs. 1. Chronic hip pain (Right)   2. Osteoarthritis of hip (Right)   3. Greater trochanteric bursitis of hip (Right)   4. Chronic anticoagulation (Xarelto)   5. CKD stage 3a, GFR 45-59 ml/min (HCC)    NAS-11 Pain score:   Pre-procedure: 8 /10   Post-procedure: 0-No pain/10      Target: Intra-articular aspect of the hip joint & peri-articular bursae Region: Hip joint proper. Femoral region Procedure Type: Percutaneous injection   Position /  Prep / Materials:  Position: Lateral Decubitus with affected side up  Prep solution: ChloraPrep (2% chlorhexidine gluconate and 70% isopropyl alcohol) Prep Area:  Entire Posterolateral hip area. Materials:  Tray: Block tray Needle(s):  Type: Spinal  Gauge (G): 22  Length: 7-in  Qty: 1  H&P (Pre-op Assessment):  [name] is a 72 y.o. (year old), male patient, seen today for interventional treatment. He  has a past surgical history that includes Appendectomy; Nasal septum surgery; Tonsillectomy; Testicle surgery; Cardiac catheterization; Back surgery; Cataract extraction w/PHACO (Right, 10/20/2020); and Cataract extraction w/PHACO (Left, 11/03/2020). [name] has a current medication list which includes the following prescription(s): acetaminophen, allopurinol, ascorbic acid, candesartan, chlorthalidone, cholecalciferol, cyanocobalamin, cyclobenzaprine, dulaglutide, epinephrine, famotidine, fexofenadine, finasteride, fluticasone, furosemide, gabapentin, hydrocortisone, ketoconazole, lidocaine, potassium chloride sa, propranolol, rivaroxaban, rosuvastatin, topiramate, and tramadol. His primarily concern today is the Hip Pain (right)  Initial Vital Signs:  Pulse/HCG Rate: 78ECG Heart Rate: 81 Temp: 98.1 F (36.7 C) Resp: 16 BP: 99/84 SpO2: 99 %  BMI: Estimated body mass index is 36.78 kg/m as calculated from the following:   Height as of this encounter: 5' 10.5" (1.791 m).   Weight as of this encounter: 260 lb (117.9 kg).  Risk Assessment: Allergies: Reviewed. He is allergic to penicillins, amlodipine, amlodipine besylate, atorvastatin, erenumab-aooe, lisinopril, penicillin g, and simvastatin.  Allergy Precautions: None required Coagulopathies: Reviewed. None identified.  Blood-thinner therapy: None at this time Active Infection(s): Reviewed. None identified. [name] is afebrile  Site Confirmation: [name] was asked to confirm the procedure and laterality before marking  the site Procedure checklist: Completed Consent: Before the procedure and under the influence of no sedative(s), amnesic(s), or  anxiolytics, the patient was informed of the treatment options, risks and possible complications. To fulfill our ethical and legal obligations, as recommended by the American Medical Association's Code of Ethics, I have informed the patient of my clinical impression; the nature and purpose of the treatment or procedure; the risks, benefits, and possible complications of the intervention; the alternatives, including doing nothing; the risk(s) and benefit(s) of the alternative treatment(s) or procedure(s); and the risk(s) and benefit(s) of doing nothing. The patient was provided information about the general risks and possible complications associated with the procedure. These may include, but are not limited to: failure to achieve desired goals, infection, bleeding, organ or nerve damage, allergic reactions, paralysis, and death. In addition, the patient was informed of those risks and complications associated to the procedure, such as failure to decrease pain; infection; bleeding; organ or nerve damage with subsequent damage to sensory, motor, and/or autonomic systems, resulting in permanent pain, numbness, and/or weakness of one or several areas of the body; allergic reactions; (i.e.: anaphylactic reaction); and/or death. Furthermore, the patient was informed of those risks and complications associated with the medications. These include, but are not limited to: allergic reactions (i.e.: anaphylactic or anaphylactoid reaction(s)); adrenal axis suppression; blood sugar elevation that in diabetics may result in ketoacidosis or comma; water retention that in patients with history of congestive heart failure may result in shortness of breath, pulmonary edema, and decompensation with resultant heart failure; weight gain; swelling or edema; medication-induced neural toxicity; particulate  matter embolism and blood vessel occlusion with resultant organ, and/or nervous system infarction; and/or aseptic necrosis of one or more joints. Finally, the patient was informed that Medicine is not an exact science; therefore, there is also the possibility of unforeseen or unpredictable risks and/or possible complications that may result in a catastrophic outcome. The patient indicated having understood very clearly. We have given the patient no guarantees and we have made no promises. Enough time was given to the patient to ask questions, all of which were answered to the patient's satisfaction. Mr. Mckinzie has indicated that he wanted to continue with the procedure. Attestation: I, the ordering provider, attest that I have discussed with the patient the benefits, risks, side-effects, alternatives, likelihood of achieving goals, and potential problems during recovery for the procedure that I have provided informed consent. Date  Time: 04/27/2023  8:58 AM  Pre-Procedure Preparation:  Monitoring: As per clinic protocol. Respiration, ETCO2, SpO2, BP, heart rate and rhythm monitor placed and checked for adequate function Safety Precautions: Patient was assessed for positional comfort and pressure points before starting the procedure. Time-out: I initiated and conducted the "Time-out" before starting the procedure, as per protocol. The patient was asked to participate by confirming the accuracy of the "Time Out" information. Verification of the correct person, site, and procedure were performed and confirmed by me, the nursing staff, and the patient. "Time-out" conducted as per Joint Commission's Universal Protocol (UP.01.01.01). Time: 0953 Start Time: 0953 hrs.  Narrative                Rationale (medical necessity): procedure needed and proper for the diagnosis and/or treatment of the patient's medical symptoms and needs. Procedural Technique Safety Precautions: Aspiration looking for blood return  was conducted prior to all injections. At no point did we inject any substances, as a needle was being advanced. No attempts were made at seeking any paresthesias. Safe injection practices and needle disposal techniques used. Medications properly checked for expiration dates. SDV (single dose vial) medications  used. Description of the Procedure: Protocol guidelines were followed. The patient was assisted into a comfortable position. The target area was identified and the area prepped in the usual manner. Skin & deeper tissues infiltrated with local anesthetic. Appropriate amount of time allowed to pass for local anesthetics to take effect. The procedure needles were then advanced to the target area. Proper needle placement secured. Negative aspiration confirmed. Solution injected in intermittent fashion, asking for systemic symptoms every 0.5cc of injectate. The needles were then removed and the area cleansed, making sure to leave some of the prepping solution back to take advantage of its long term bactericidal properties.  Technical description of procedure:  Skin & deeper tissues infiltrated with local anesthetic. Appropriate amount of time allowed to pass for local anesthetics to take effect. The procedure needles were then advanced to the target area. Proper needle placement secured. Negative aspiration confirmed. Solution injected in intermittent fashion, asking for systemic symptoms every 0.5cc of injectate. The needles were then removed and the area cleansed, making sure to leave some of the prepping solution back to take advantage of its long term bactericidal properties.             Vitals:   04/27/23 0856 04/27/23 0950 04/27/23 0955 04/27/23 0957  BP: 99/84 134/88 126/87 123/81  Pulse: 78     Resp: 16 (!) 24 16 (!) 22  Temp: 98.1 F (36.7 C)     SpO2: 99% 96% 99% 98%  Weight: 260 lb (117.9 kg)     Height: 5' 10.5" (1.791 m)        Start Time: 0953 hrs. End Time: 0956  hrs.  Imaging Guidance (Non-Spinal):          Type of Imaging Technique: Fluoroscopy Guidance (Non-Spinal) Indication(s): Fluoroscopy guidance for needle placement to enhance accuracy in procedures requiring precise needle localization for targeted delivery of medication in or near specific anatomical locations not easily accessible without such real-time imaging assistance. Exposure Time: Please see nurses notes. Contrast: Before injecting any contrast, we confirmed that the patient did not have an allergy to iodine, shellfish, or radiological contrast. Once satisfactory needle placement was completed at the desired level, radiological contrast was injected. Contrast injected under live fluoroscopy. No contrast complications. See chart for type and volume of contrast used. Fluoroscopic Guidance: I was personally present during the use of fluoroscopy. "Tunnel Vision Technique" used to obtain the best possible view of the target area. Parallax error corrected before commencing the procedure. "Direction-depth-direction" technique used to introduce the needle under continuous pulsed fluoroscopy. Once target was reached, antero-posterior, oblique, and lateral fluoroscopic projection used confirm needle placement in all planes. Images permanently stored in EMR. Interpretation: I personally interpreted the imaging intraoperatively. Adequate needle placement confirmed in multiple planes. Appropriate spread of contrast into desired area was observed. No evidence of afferent or efferent intravascular uptake. Permanent images saved into the patient's record.  Post-operative Assessment:  Post-procedure Vital Signs:  Pulse/HCG Rate: 7881 Temp: 98.1 F (36.7 C) Resp: (!) 22 BP: 123/81 SpO2: 98 %  EBL: None  Complications: No immediate post-treatment complications observed by team, or reported by patient.  Note: The patient tolerated the entire procedure well. A repeat set of vitals were taken after the  procedure and the patient was kept under observation following institutional policy, for this type of procedure. Post-procedural neurological assessment was performed, showing return to baseline, prior to discharge. The patient was provided with post-procedure discharge instructions, including a section on how to identify  potential problems. Should any problems arise concerning this procedure, the patient was given instructions to immediately contact us, at any time, without hesitation. In any case, we plan to contact the patient by telephone for a follow-up status report regarding this interventional procedure.  Comments:  No additional relevant information.  Plan of Care (POC)  Orders:  Orders Placed This Encounter  Procedures   HIP INJECTION    Scheduling Instructions:     Side: Right-sided     Sedation: Patient's choice.     Timeframe: Today   HIP INJECTION    Purpose: Therapeutic/Diagnostic Indication: Hip pain 2ry to Trochanteric Burlitis right (M70.61).    Scheduling Instructions:     Procedure: Trochanteric bursa injection     Laterality: Right-sided     Sedation: Patient's choice.     Timeframe: Today   DG PAIN CLINIC C-ARM 1-60 MIN NO REPORT    Intraoperative interpretation by procedural physician at [facility].    Standing Status:   Standing    Number of Occurrences:   1    Order Specific Question:   Reason for exam:    Answer:   Assistance in needle guidance and placement for procedures requiring needle placement in or near specific anatomical locations not easily accessible without such assistance.   Informed Consent Details: Physician/Practitioner Attestation; Transcribe to consent form and obtain patient signature    Nursing Order: Transcribe to consent form and obtain patient signature. Note: Always confirm laterality of pain with Mr. Clarkin, before procedure.    Order Specific Question:   Physician/Practitioner attestation of informed consent for  procedure/surgical case    Answer:   I, the physician/practitioner, attest that I have discussed with the patient the benefits, risks, side effects, alternatives, likelihood of achieving goals and potential problems during recovery for the procedure that I have provided informed consent.    Order Specific Question:   Procedure    Answer:   Hip injection    Order Specific Question:   Physician/Practitioner performing the procedure    Answer:   Emilyn Ruble A. Laban Emperor, MD    Order Specific Question:   Indication/Reason    Answer:   Hip Joint Pain (Arthralgia)   Informed Consent Details: Physician/Practitioner Attestation; Transcribe to consent form and obtain patient signature    Note: Always confirm laterality of pain with Mr. Wilcoxen, before procedure. Transcribe to consent form and obtain patient signature.    Order Specific Question:   Physician/Practitioner attestation of informed consent for procedure/surgical case    Answer:   I, the physician/practitioner, attest that I have discussed with the patient the benefits, risks, side effects, alternatives, likelihood of achieving goals and potential problems during recovery for the procedure that I have provided informed consent.    Order Specific Question:   Procedure    Answer:   Hip bursa injection    Order Specific Question:   Physician/Practitioner performing the procedure    Answer:   Dewana Ammirati A. Laban Emperor, MD    Order Specific Question:   Indication/Reason    Answer:   Hip bursitis   Care order/instruction: Please confirm that the patient has stopped the Xarelto (Rivaroxaban) x 3 days prior to procedure or surgery.    Please confirm that the patient has stopped the Xarelto (Rivaroxaban) x 3 days prior to procedure or surgery.    Standing Status:   Standing    Number of Occurrences:   1   Provide equipment / supplies at bedside    Procedure tray: "  Block Tray" (Disposable  single use) Skin infiltration needle: Regular 1.5-in, 25-G,  (x1) Block Needle type: Spinal Amount/quantity: 1 Size: Long (7-inch) Gauge: 22G    Standing Status:   Standing    Number of Occurrences:   1    Order Specific Question:   Specify    Answer:   Block Tray   Bleeding precautions    Standing Status:   Standing    Number of Occurrences:   1   Chronic Opioid Analgesic:  No opioid analgesics prescribed by our practice. Highest recorded MME/day: 26.67 mg/day MME/day: 0 mg/day   Medications ordered for procedure: Meds ordered this encounter  Medications   iohexol (OMNIPAQUE) 180 MG/ML injection 10 mL    Must be Myelogram-compatible. If not available, you may substitute with a water-soluble, non-ionic, hypoallergenic, myelogram-compatible radiological contrast medium.   lidocaine (XYLOCAINE) 2 % (with pres) injection 400 mg   pentafluoroprop-tetrafluoroeth (GEBAUERS) aerosol   ropivacaine (PF) 2 mg/mL (0.2%) (NAROPIN) injection 9 mL   methylPREDNISolone acetate (DEPO-MEDROL) injection 80 mg   Medications administered: We administered iohexol, lidocaine, pentafluoroprop-tetrafluoroeth, ropivacaine (PF) 2 mg/mL (0.2%), and methylPREDNISolone acetate.  See the medical record for exact dosing, route, and time of administration.  Follow-up plan:   Return in about 2 weeks (around 05/11/2023) for (Face2F), (PPE).       Interventional Therapies  Risk factors  Considerations:   NOTE: Xarelto Anticoagulation (Stop: 3 days  Restart: 6 hours)  HTN  CKD     Planned  Pending:   Diagnostic x-rays of the lumbar spine with bending views  Diagnostic x-rays of the right hip  Diagnostic MRI of the lumbar spine  Therapeutic right IA hip #3 + right trochanteric bursa injection #1   Under consideration:   Therapeutic left lumbar facet RFA #3 Possible right opturator + femoral NB  Possible right opturator + femoral nerve RFA    Completed:   Therapeutic right IA hip joint inj. x2 (04/30/2019) (100/100/50/90-100) Diagnostic bilateral SI joint  Blk x2 (03/10/2020) (50/50/100 x 5 wks/0)  Diagnostic/therapeutic right SI joint Blk x3 (12/15/2022) (100/100/0/0)  Diagnostic bilateral lumbar facet MBB x1 (05/07/2020) (100/100/100 x1 week/>75)  Therapeutic left lumbar facet RFA x3 (06/16/2022) (100/100/100/100)  Therapeutic right lumbar facet RFA x3 (05/24/2022) (100/100/80/80) (by 06/16/2022 it had progressed to 100% ongoing relief)   Palliative options:   Palliative right IA hip joint inj.  Palliative bilateral SI joint Blk  Therapeutic/palliative lumbar facet RFA       Recent Visits Date Type Provider Dept  04/19/23 Office Visit Delano Metz, MD [facility]  03/21/23 Office Visit Delano Metz, MD [facility]  02/23/23 Procedure visit Delano Metz, MD [facility]  02/07/23 Office Visit Delano Metz, MD [facility]  Showing recent visits within past 90 days and meeting all other requirements Today's Visits Date Type Provider Dept  04/27/23 Procedure visit Delano Metz, MD [facility]  Showing today's visits and meeting all other requirements Future Appointments Date Type Provider Dept  05/11/23 Appointment Delano Metz, MD [facility]  Showing future appointments within next 90 days and meeting all other requirements  Disposition: Discharge home  Discharge (Date  Time): 04/27/2023; 1003 hrs.   Primary Care Physician: [facility] Location: [facility] Note by: Oswaldo Done, MD (TTS technology used. I apologize for any typographical errors that were not detected and corrected.) Date: 04/27/2023; Time: 10:11 AM  Disclaimer:  Medicine is not an Visual merchandiser. The  only guarantee in medicine is that nothing is guaranteed. It is important to note that the decision to proceed with this intervention was based on the information collected from the patient. The Data and conclusions were drawn  from the patient's questionnaire, the interview, and the physical examination. Because the information was provided in large part by the patient, it cannot be guaranteed that it has not been purposely or unconsciously manipulated. Every effort has been made to obtain as much relevant data as possible for this evaluation. It is important to note that the conclusions that lead to this procedure are derived in large part from the available data. Always take into account that the treatment will also be dependent on availability of resources and existing treatment guidelines, considered by other Pain Management Practitioners as being common knowledge and practice, at the time of the intervention. For Medico-Legal purposes, it is also important to point out that variation in procedural techniques and pharmacological choices are the acceptable norm. The indications, contraindications, technique, and results of the above procedure should only be interpreted and judged by a Board-Certified Interventional Pain Specialist with extensive familiarity and expertise in the same exact procedure and technique.

## 2023-04-27 ENCOUNTER — Ambulatory Visit: Payer: No Typology Code available for payment source | Attending: Pain Medicine | Admitting: Pain Medicine

## 2023-04-27 ENCOUNTER — Encounter: Payer: Self-pay | Admitting: Pain Medicine

## 2023-04-27 ENCOUNTER — Ambulatory Visit
Admission: RE | Admit: 2023-04-27 | Discharge: 2023-04-27 | Disposition: A | Discharge status: Home / Self Care | Payer: No Typology Code available for payment source | Source: Ambulatory Visit | Attending: Pain Medicine | Admitting: Pain Medicine

## 2023-04-27 VITALS — BP 123/81 | HR 78 | Temp 98.1°F | Resp 22 | Ht 70.5 in | Wt 260.0 lb

## 2023-04-27 DIAGNOSIS — N1831 Chronic kidney disease, stage 3a: Secondary | ICD-10-CM | POA: Diagnosis not present

## 2023-04-27 DIAGNOSIS — M25551 Pain in right hip: Secondary | ICD-10-CM | POA: Diagnosis present

## 2023-04-27 DIAGNOSIS — G8929 Other chronic pain: Secondary | ICD-10-CM | POA: Diagnosis present

## 2023-04-27 DIAGNOSIS — M7071 Other bursitis of hip, right hip: Secondary | ICD-10-CM | POA: Insufficient documentation

## 2023-04-27 DIAGNOSIS — Z7901 Long term (current) use of anticoagulants: Secondary | ICD-10-CM | POA: Insufficient documentation

## 2023-04-27 DIAGNOSIS — M1611 Unilateral primary osteoarthritis, right hip: Secondary | ICD-10-CM | POA: Diagnosis present

## 2023-04-27 DIAGNOSIS — M7061 Trochanteric bursitis, right hip: Secondary | ICD-10-CM | POA: Diagnosis not present

## 2023-04-27 MED ORDER — METHYLPREDNISOLONE ACETATE 80 MG/ML IJ SUSP
80.0000 mg | Freq: Once | INTRAMUSCULAR | Status: AC
Start: 2023-04-27 — End: 2023-04-27
  Administered 2023-04-27: 80 mg via INTRA_ARTICULAR
  Filled 2023-04-27: qty 1

## 2023-04-27 MED ORDER — PENTAFLUOROPROP-TETRAFLUOROETH EX AERO
INHALATION_SPRAY | Freq: Once | CUTANEOUS | Status: AC
Start: 2023-04-27 — End: 2023-04-27
  Administered 2023-04-27: 30 via TOPICAL

## 2023-04-27 MED ORDER — IOHEXOL 180 MG/ML  SOLN
10.0000 mL | Freq: Once | INTRAMUSCULAR | Status: AC
Start: 2023-04-27 — End: 2023-04-27
  Administered 2023-04-27: 10 mL via INTRA_ARTICULAR
  Filled 2023-04-27: qty 20

## 2023-04-27 MED ORDER — ROPIVACAINE HCL 2 MG/ML IJ SOLN
9.0000 mL | Freq: Once | INTRAMUSCULAR | Status: AC
  Administered 2023-04-27: 9 mL via INTRA_ARTICULAR
  Filled 2023-04-27: qty 20

## 2023-04-27 MED ORDER — LIDOCAINE HCL 2 % IJ SOLN
20.0000 mL | Freq: Once | INTRAMUSCULAR | Status: AC
Start: 2023-04-27 — End: 2023-04-27
  Administered 2023-04-27: 400 mg
  Filled 2023-04-27: qty 20

## 2023-04-27 NOTE — Progress Notes (Signed)
Safety precautions to be maintained throughout the outpatient stay will include: orient to surroundings, keep bed in low position, maintain call bell within reach at all times, provide assistance with transfer out of bed and ambulation.  

## 2023-04-28 ENCOUNTER — Ambulatory Visit
Admission: RE | Admit: 2023-04-28 | Discharge: 2023-04-28 | Disposition: A | Discharge status: Home / Self Care | Payer: No Typology Code available for payment source | Source: Ambulatory Visit | Attending: Pain Medicine | Admitting: Pain Medicine

## 2023-04-28 ENCOUNTER — Telehealth: Payer: Self-pay

## 2023-04-28 DIAGNOSIS — M48061 Spinal stenosis, lumbar region without neurogenic claudication: Secondary | ICD-10-CM | POA: Diagnosis present

## 2023-04-28 DIAGNOSIS — R937 Abnormal findings on diagnostic imaging of other parts of musculoskeletal system: Secondary | ICD-10-CM | POA: Insufficient documentation

## 2023-04-28 DIAGNOSIS — M25551 Pain in right hip: Secondary | ICD-10-CM | POA: Diagnosis present

## 2023-04-28 DIAGNOSIS — M4316 Spondylolisthesis, lumbar region: Secondary | ICD-10-CM | POA: Diagnosis present

## 2023-04-28 DIAGNOSIS — G8929 Other chronic pain: Secondary | ICD-10-CM | POA: Insufficient documentation

## 2023-04-28 DIAGNOSIS — M160 Bilateral primary osteoarthritis of hip: Secondary | ICD-10-CM | POA: Insufficient documentation

## 2023-04-28 DIAGNOSIS — M5459 Other low back pain: Secondary | ICD-10-CM | POA: Insufficient documentation

## 2023-04-28 DIAGNOSIS — M51372 Other intervertebral disc degeneration, lumbosacral region with discogenic back pain and lower extremity pain: Secondary | ICD-10-CM | POA: Diagnosis present

## 2023-04-28 DIAGNOSIS — M25552 Pain in left hip: Secondary | ICD-10-CM | POA: Diagnosis present

## 2023-04-28 DIAGNOSIS — M47816 Spondylosis without myelopathy or radiculopathy, lumbar region: Secondary | ICD-10-CM | POA: Insufficient documentation

## 2023-04-28 DIAGNOSIS — M545 Low back pain, unspecified: Secondary | ICD-10-CM | POA: Insufficient documentation

## 2023-04-28 NOTE — Telephone Encounter (Signed)
Called PP no answer. Mailbox full could not leave message.

## 2023-05-03 ENCOUNTER — Telehealth: Payer: Self-pay | Admitting: Cardiology

## 2023-05-03 NOTE — Telephone Encounter (Signed)
Spoke with patient who is verbally requesting his last office visit note or a summary of his medical conditions.  Advised since we have not seen him since 9/23, that would be the most current information we have.  He states understanding.  Office note printed and mailed to home address.

## 2023-05-03 NOTE — Telephone Encounter (Signed)
Pt would like his last ov note mail to him

## 2023-05-10 NOTE — Progress Notes (Unsigned)
PROVIDER NOTE: Information contained herein reflects review and annotations entered in association with encounter. Interpretation of such information and data should be left to medically-trained personnel. Information provided to patient can be located elsewhere in the medical record under "Patient Instructions". Document created using STT-dictation technology, any transcriptional errors that may result from process are unintentional.    Patient: [name] Whitaker  Service Category: E/M  Provider: Oswaldo Done, MD  DOB: 21-Jul-1950  DOS: 05/11/2023  Referring Provider: Center, [facility]  MRN: [id number]  Specialty: Interventional Pain Management  PCP: [facility], [facility]  Type: Established Patient  Setting: Ambulatory outpatient    Location: Office  Delivery: Face-to-face     HPI  [name] Whitaker, a 72 y.o. year old male, is here today because of his Chronic hip pain, right [M25.551, G89.29]. [name] Whitaker's primary complain today is No chief complaint on file.  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, L4-5, L5-S1); Lumbar facet joint syndrome (Bilateral) (R>L); DDD (degenerative disc disease), lumbosacral; Chronic hip pain (Bilateral); Chronic low back pain (1ry area of Pain) (Bilateral) (R>L) w/o sciatica; Chronic sacroiliac joint pain (Bilateral) (R>L); Other intervertebral disc degeneration, lumbar region; Osteoarthritis of hip (Right); Arthritis of hip (Right); Osteoarthritis of hips (Bilateral); Somatic dysfunction of sacroiliac joints (Bilateral) (R>L); Other spondylosis, sacral and sacrococcygeal region; Acute postoperative pain; Migraine with  aura, not intractable, without status migrainosus; Primary osteoarthritis, left ankle and foot; Osteoarthritis of left foot; Lumbosacral spondylosis without myelopathy; Spinal stenosis of lumbar region; Grade 1 Anterolisthesis (2mm) of lumbar spine of L4/L5; Statin myopathy; Chronic hip pain (Right); Lumbar facet joint pain; Chronic gouty arthritis; Trigger point with back pain; Lumbar trigger point syndrome; and Greater trochanteric bursitis of hip (Right) on their pertinent problem list. Pain Assessment: Severity of   is reported as a  /10. Location:    / . Onset:  . Quality:  . Timing:  . Modifying factor(s):  Marland Kitchen Vitals:  vitals were not taken for this visit.  BMI: Estimated body mass index is 36.78 kg/m as calculated from the following:   Height as of 04/27/23: 5' 10.5" (1.791 m).   Weight as of 04/27/23: 260 lb (117.9 kg). Last encounter: 04/19/2023. Last procedure: 04/27/2023.  Reason for encounter: post-procedure evaluation and assessment. ***  Discussed the use of AI scribe software for clinical note transcription with the patient, who gave verbal consent to proceed.  History of Present Illness          Post-procedure evaluation   Type: Hip & bursae injection #1  Laterality: Right (-RT)  Bursae: Trochanteric  Laterality: Right (-RT)  Approach: Percutaneous posterolateral approach. Level: Lower pelvic and hip joint level.  Imaging: Fluoroscopy-guided Non-spinal ([id number]) Anesthesia: Local anesthesia (1-2% Lidocaine) Anxiolysis: None                 Sedation: No Sedation                       DOS: 04/27/2023  Performed by: Oswaldo Done, MD  Purpose: Diagnostic/Therapeutic Indications: Hip pain severe enough to impact quality of life or function. Rationale (medical necessity): procedure needed and proper for the diagnosis and/or treatment of [name] Whitaker's medical symptoms and needs. 1. Chronic hip pain (Right)  2. Osteoarthritis of hip (Right)   3. Greater  trochanteric bursitis of hip (Right)   4. Chronic anticoagulation (Xarelto)   5. CKD stage 3a, GFR 45-59 ml/min (HCC)    NAS-11 Pain score:   Pre-procedure: 8 /10   Post-procedure: 0-No pain/10       Effectiveness:  Initial hour after procedure:   ***. Subsequent 4-6 hours post-procedure:   ***. Analgesia past initial 6 hours:   ***. Ongoing improvement:  Analgesic:  *** Function:    ***    ROM:    ***     Pharmacotherapy Assessment  Analgesic: No opioid analgesics prescribed by our practice. Highest recorded MME/day: 26.67 mg/day MME/day: 0 mg/day    Monitoring: [place] PMP: PDMP reviewed during this encounter.       Pharmacotherapy: No side-effects or adverse reactions reported. Compliance: No problems identified. Effectiveness: Clinically acceptable.  No notes on file  No results found for: "CBDTHCR" No results found for: "D8THCCBX" No results found for: "D9THCCBX"  UDS:  No results found for: "SUMMARY"    ROS  Constitutional: Denies any fever or chills Gastrointestinal: No reported hemesis, hematochezia, vomiting, or acute GI distress Musculoskeletal: Denies any acute onset joint swelling, redness, loss of ROM, or weakness Neurological: No reported episodes of acute onset apraxia, aphasia, dysarthria, agnosia, amnesia, paralysis, loss of coordination, or loss of consciousness  Medication Review  Cholecalciferol, Cyanocobalamin, Dulaglutide, EPINEPHrine, acetaminophen, allopurinol, ascorbic acid, candesartan, chlorthalidone, cyclobenzaprine, famotidine, fexofenadine, finasteride, fluticasone, furosemide, gabapentin, hydrocortisone, ketoconazole, lidocaine, potassium chloride SA, propranolol, rivaroxaban, rosuvastatin, topiramate, and traMADol  History Review  Allergy: [name] Whitaker is allergic to penicillins, amlodipine, amlodipine besylate, atorvastatin, erenumab-aooe, lisinopril, penicillin g, and simvastatin. Drug: [name] Whitaker  reports no history of drug  use. Alcohol:  reports no history of alcohol use. Tobacco:  reports that he quit smoking about 39 years ago. His smoking use included pipe. He has never used smokeless tobacco. Social: Mr. Kubicek  reports that he quit smoking about 39 years ago. His smoking use included pipe. He has never used smokeless tobacco. He reports that he does not drink alcohol and does not use drugs. Medical:  has a past medical history of Abnormal ECG (09/27/2013), Chest pain (09/27/2013), Dilated aortic root (HCC) (09/27/2013), DVT, lower extremity (HCC) (10/27/2011), Dyspnea (06/27/2014), GERD (gastroesophageal reflux disease), Gout, History of DVT (deep vein thrombosis) (04/14/2019), History of pulmonary embolism (04/14/2019), HTN (hypertension) (10/25/2011), Hypercholesteremia, Migraine, OSA (obstructive sleep apnea), and Pre-diabetes. Surgical: Mr. Thedford  has a past surgical history that includes Appendectomy; Nasal septum surgery; Tonsillectomy; Testicle surgery; Cardiac catheterization; Back surgery; Cataract extraction w/PHACO (Right, 10/20/2020); and Cataract extraction w/PHACO (Left, 11/03/2020). Family: family history includes Clotting disorder in his brother; Hypertension in his father and mother.  Laboratory Chemistry Profile   Renal Lab Results  Component Value Date   BUN 6 (L) 07/18/2022   CREATININE 1.21 07/18/2022   BCR 9 (L) 02/25/2021   GFRAA 62 04/15/2019   GFRNONAA >60 07/18/2022    Hepatic Lab Results  Component Value Date   AST 20 07/18/2022   ALT 20 07/18/2022   ALBUMIN 3.7 07/18/2022   ALKPHOS 55 07/18/2022   LIPASE 19 03/15/2007    Electrolytes Lab Results  Component Value Date   NA 140 07/18/2022   K 3.4 (L) 07/18/2022   CL 103 07/18/2022   CALCIUM 9.0 07/18/2022   MG 2.2 04/15/2019    Bone Lab Results  Component Value Date   25OHVITD1 43 04/15/2019   25OHVITD2 <1.0 04/15/2019  25OHVITD3 43 04/15/2019    Inflammation (CRP: Acute Phase) (ESR: Chronic Phase) Lab Results   Component Value Date   CRP 2 04/15/2019   ESRSEDRATE 6 04/15/2019         Note: Above Lab results reviewed.  Recent Imaging Review  DG PAIN CLINIC C-ARM 1-60 MIN NO REPORT Fluoro was used, but no Radiologist interpretation will be provided.  Please refer to "NOTES" tab for provider progress note. Note: Reviewed        Physical Exam  General appearance: Well nourished, well developed, and well hydrated. In no apparent acute distress Mental status: Alert, oriented x 3 (person, place, & time)       Respiratory: No evidence of acute respiratory distress Eyes: PERLA Vitals: There were no vitals taken for this visit. BMI: Estimated body mass index is 36.78 kg/m as calculated from the following:   Height as of 04/27/23: 5' 10.5" (1.791 m).   Weight as of 04/27/23: 260 lb (117.9 kg). Ideal: Ideal body weight: 74.1 kg (163 lb 7.5 oz) Adjusted ideal body weight: 91.7 kg (202 lb 1.3 oz)  Assessment   Diagnosis Status  1. Chronic hip pain (Right)   2. Greater trochanteric bursitis of hip (Right)   3. Osteoarthritis of hip (Right)   4. Chronic hip pain (Bilateral)   5. Chronic low back pain (1ry area of Pain) (Bilateral) (R>L) w/o sciatica   6. Postop check    Controlled Controlled Controlled   Updated Problems: No problems updated.  Plan of Care  Problem-specific:  Assessment and Plan            Mr. Prodigy Dubow has a current medication list which includes the following long-term medication(s): allopurinol, candesartan, chlorthalidone, famotidine, fexofenadine, furosemide, potassium chloride sa, rosuvastatin, and topiramate.  Pharmacotherapy (Medications Ordered): No orders of the defined types were placed in this encounter.  Orders:  No orders of the defined types were placed in this encounter.  Follow-up plan:   No follow-ups on file.      Interventional Therapies  Risk factors  Considerations:   NOTE: Xarelto Anticoagulation (Stop: 3 days  Restart: 6  hours)  HTN  CKD     Planned  Pending:   Diagnostic x-rays of the lumbar spine with bending views  Diagnostic x-rays of the right hip  Diagnostic MRI of the lumbar spine  Therapeutic right IA hip #3 + right trochanteric bursa injection #1   Under consideration:   Therapeutic left lumbar facet RFA #3 Possible right opturator + femoral NB  Possible right opturator + femoral nerve RFA    Completed:   Therapeutic right IA hip joint inj. x2 (04/30/2019) (100/100/50/90-100) Diagnostic bilateral SI joint Blk x2 (03/10/2020) (50/50/100 x 5 wks/0)  Diagnostic/therapeutic right SI joint Blk x3 (12/15/2022) (100/100/0/0)  Diagnostic bilateral lumbar facet MBB x1 (05/07/2020) (100/100/100 x1 week/>75)  Therapeutic left lumbar facet RFA x3 (06/16/2022) (100/100/100/100)  Therapeutic right lumbar facet RFA x3 (05/24/2022) (100/100/80/80) (by 06/16/2022 it had progressed to 100% ongoing relief)   Palliative options:   Palliative right IA hip joint inj.  Palliative bilateral SI joint Blk  Therapeutic/palliative lumbar facet RFA       Recent Visits Date Type Provider Dept  04/27/23 Procedure visit Delano Metz, MD [facility]  04/19/23 Office Visit Delano Metz, MD [facility]  03/21/23 Office Visit Delano Metz, MD [facility]  02/23/23 Procedure visit Delano Metz, MD [facility]  Showing recent visits within past 90 days and meeting all  other requirements Future Appointments Date Type Provider Dept  05/11/23 Appointment Delano Metz, MD [facility]  Showing future appointments within next 90 days and meeting all other requirements  I discussed the assessment and treatment plan with the patient. The patient was provided an opportunity to ask questions and all were answered. The patient agreed with the plan and demonstrated an understanding of the instructions.  Patient advised to call back or seek an in-person  evaluation if the symptoms or condition worsens.  Duration of encounter: *** minutes.  Total time on encounter, as per AMA guidelines included both the face-to-face and non-face-to-face time personally spent by the physician and/or other qualified health care professional(s) on the day of the encounter (includes time in activities that require the physician or other qualified health care professional and does not include time in activities normally performed by clinical staff). Physician's time may include the following activities when performed: Preparing to see the patient (e.g., pre-charting review of records, searching for previously ordered imaging, lab work, and nerve conduction tests) Review of prior analgesic pharmacotherapies. Reviewing PMP Interpreting ordered tests (e.g., lab work, imaging, nerve conduction tests) Performing post-procedure evaluations, including interpretation of diagnostic procedures Obtaining and/or reviewing separately obtained history Performing a medically appropriate examination and/or evaluation Counseling and educating the patient/family/caregiver Ordering medications, tests, or procedures Referring and communicating with other health care professionals (when not separately reported) Documenting clinical information in the electronic or other health record Independently interpreting results (not separately reported) and communicating results to the patient/ family/caregiver Care coordination (not separately reported)  Note by: Oswaldo Done, MD Date: 05/11/2023; Time: 12:15 PM

## 2023-05-11 ENCOUNTER — Encounter: Payer: Self-pay | Admitting: Pain Medicine

## 2023-05-11 ENCOUNTER — Ambulatory Visit: Payer: No Typology Code available for payment source | Attending: Pain Medicine | Admitting: Pain Medicine

## 2023-05-11 VITALS — BP 142/98 | Temp 97.6°F | Resp 16 | Ht 70.5 in | Wt 260.0 lb

## 2023-05-11 DIAGNOSIS — R937 Abnormal findings on diagnostic imaging of other parts of musculoskeletal system: Secondary | ICD-10-CM | POA: Diagnosis not present

## 2023-05-11 DIAGNOSIS — M79661 Pain in right lower leg: Secondary | ICD-10-CM | POA: Insufficient documentation

## 2023-05-11 DIAGNOSIS — M51379 Other intervertebral disc degeneration, lumbosacral region without mention of lumbar back pain or lower extremity pain: Secondary | ICD-10-CM | POA: Diagnosis not present

## 2023-05-11 DIAGNOSIS — M533 Sacrococcygeal disorders, not elsewhere classified: Secondary | ICD-10-CM | POA: Insufficient documentation

## 2023-05-11 DIAGNOSIS — M1611 Unilateral primary osteoarthritis, right hip: Secondary | ICD-10-CM | POA: Diagnosis not present

## 2023-05-11 DIAGNOSIS — M19072 Primary osteoarthritis, left ankle and foot: Secondary | ICD-10-CM | POA: Insufficient documentation

## 2023-05-11 DIAGNOSIS — G8929 Other chronic pain: Secondary | ICD-10-CM | POA: Insufficient documentation

## 2023-05-11 DIAGNOSIS — G4733 Obstructive sleep apnea (adult) (pediatric): Secondary | ICD-10-CM | POA: Insufficient documentation

## 2023-05-11 DIAGNOSIS — Z87891 Personal history of nicotine dependence: Secondary | ICD-10-CM | POA: Insufficient documentation

## 2023-05-11 DIAGNOSIS — M51369 Other intervertebral disc degeneration, lumbar region without mention of lumbar back pain or lower extremity pain: Secondary | ICD-10-CM | POA: Insufficient documentation

## 2023-05-11 DIAGNOSIS — R930 Abnormal findings on diagnostic imaging of skull and head, not elsewhere classified: Secondary | ICD-10-CM | POA: Diagnosis not present

## 2023-05-11 DIAGNOSIS — M47816 Spondylosis without myelopathy or radiculopathy, lumbar region: Secondary | ICD-10-CM | POA: Diagnosis not present

## 2023-05-11 DIAGNOSIS — M109 Gout, unspecified: Secondary | ICD-10-CM | POA: Insufficient documentation

## 2023-05-11 DIAGNOSIS — Z09 Encounter for follow-up examination after completed treatment for conditions other than malignant neoplasm: Secondary | ICD-10-CM | POA: Insufficient documentation

## 2023-05-11 DIAGNOSIS — M7071 Other bursitis of hip, right hip: Secondary | ICD-10-CM | POA: Insufficient documentation

## 2023-05-11 DIAGNOSIS — M4316 Spondylolisthesis, lumbar region: Secondary | ICD-10-CM | POA: Diagnosis not present

## 2023-05-11 DIAGNOSIS — M25551 Pain in right hip: Secondary | ICD-10-CM | POA: Diagnosis present

## 2023-05-11 DIAGNOSIS — M4807 Spinal stenosis, lumbosacral region: Secondary | ICD-10-CM | POA: Diagnosis not present

## 2023-05-11 DIAGNOSIS — M47817 Spondylosis without myelopathy or radiculopathy, lumbosacral region: Secondary | ICD-10-CM | POA: Diagnosis not present

## 2023-05-11 DIAGNOSIS — Z7901 Long term (current) use of anticoagulants: Secondary | ICD-10-CM | POA: Insufficient documentation

## 2023-05-11 DIAGNOSIS — M48061 Spinal stenosis, lumbar region without neurogenic claudication: Secondary | ICD-10-CM | POA: Diagnosis not present

## 2023-05-11 DIAGNOSIS — N1831 Chronic kidney disease, stage 3a: Secondary | ICD-10-CM | POA: Diagnosis not present

## 2023-05-11 DIAGNOSIS — I10 Essential (primary) hypertension: Secondary | ICD-10-CM | POA: Insufficient documentation

## 2023-05-11 DIAGNOSIS — M79662 Pain in left lower leg: Secondary | ICD-10-CM | POA: Diagnosis not present

## 2023-05-11 DIAGNOSIS — K219 Gastro-esophageal reflux disease without esophagitis: Secondary | ICD-10-CM | POA: Insufficient documentation

## 2023-05-11 DIAGNOSIS — M7061 Trochanteric bursitis, right hip: Secondary | ICD-10-CM | POA: Insufficient documentation

## 2023-05-11 DIAGNOSIS — E7800 Pure hypercholesterolemia, unspecified: Secondary | ICD-10-CM | POA: Insufficient documentation

## 2023-05-11 DIAGNOSIS — M25552 Pain in left hip: Secondary | ICD-10-CM | POA: Diagnosis not present

## 2023-05-11 DIAGNOSIS — G8918 Other acute postprocedural pain: Secondary | ICD-10-CM | POA: Insufficient documentation

## 2023-05-11 DIAGNOSIS — M545 Low back pain, unspecified: Secondary | ICD-10-CM | POA: Insufficient documentation

## 2023-05-11 NOTE — Progress Notes (Signed)
Safety precautions to be maintained throughout the outpatient stay will include: orient to surroundings, keep bed in low position, maintain call bell within reach at all times, provide assistance with transfer out of bed and ambulation.  

## 2023-05-11 NOTE — Patient Instructions (Signed)
  ____________________________________________________________________________________________  Understanding MRI Findings           Facet Joint Pain Patterns      Nerve Root Pain Patterns (Dermatomes)    ____________________________________________________________________________________________

## 2023-06-05 ENCOUNTER — Ambulatory Visit: Payer: No Typology Code available for payment source | Attending: Cardiology | Admitting: Cardiology

## 2023-06-05 ENCOUNTER — Other Ambulatory Visit: Payer: Self-pay | Admitting: *Deleted

## 2023-06-05 VITALS — BP 150/100 | HR 77 | Ht 70.5 in | Wt 264.0 lb

## 2023-06-05 DIAGNOSIS — I2510 Atherosclerotic heart disease of native coronary artery without angina pectoris: Secondary | ICD-10-CM | POA: Diagnosis not present

## 2023-06-05 DIAGNOSIS — G720 Drug-induced myopathy: Secondary | ICD-10-CM

## 2023-06-05 DIAGNOSIS — Z79899 Other long term (current) drug therapy: Secondary | ICD-10-CM | POA: Diagnosis not present

## 2023-06-05 DIAGNOSIS — I10 Essential (primary) hypertension: Secondary | ICD-10-CM

## 2023-06-05 DIAGNOSIS — I77810 Thoracic aortic ectasia: Secondary | ICD-10-CM

## 2023-06-05 DIAGNOSIS — T466X5D Adverse effect of antihyperlipidemic and antiarteriosclerotic drugs, subsequent encounter: Secondary | ICD-10-CM | POA: Diagnosis not present

## 2023-06-05 DIAGNOSIS — R0789 Other chest pain: Secondary | ICD-10-CM

## 2023-06-05 MED ORDER — SPIRONOLACTONE 25 MG PO TABS: 12.5000 mg | ORAL_TABLET | Freq: Every day | ORAL | 3 refills | Status: DC

## 2023-06-05 NOTE — Progress Notes (Signed)
 Cardiology Office Note:  .   Date:  06/05/2023  ID:  [name] Whitaker, DOB 05/24/1950, MRN [id number] PCP: [facility]  [facility] Providers Cardiologist:  [name] Parchment, MD     History of Present Illness: [name] Whitaker is a 73 y.o. 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 The patient's most recent echocardiogram showed a low normal ejection fraction of 50% and moderate aortic root dilation at 47mm. The patient's coronary disease is moderate with a 50% lesion, managed medically since cardiac catheterization in 2014. The patient is on lifelong Xarelto  due to recurrent PE.  For hyperlipidemia, the patient is on Crestor  20mg , which has been well-tolerated despite previous difficulties with statin intolerances. The patient's LDL was 69 on this regimen. The patient's baseline EKG shows nonspecific T wave inversion.  The patient's blood pressure has been fluctuating, with some readings as high as 130/90. The patient is currently on Candesartan 32mg  and Furosemide, with Propranolol  for headaches. The patient has had prior difficulties with Amlodipine  due to swelling and Lisinopril due to cough.  The patient's most recent nuclear stress test was low risk and reassuring. The patient's blood pressure was high at the time of the visit.          No CP, no SOB  Studies Reviewed: [name]   EKG Interpretation Date/Time:  Monday June 05 2023 14:50:12 EST Ventricular Rate:  77 PR Interval:  164 QRS Duration:  98 QT Interval:  390 QTC Calculation: 441 R Axis:   -61  Text Interpretation: Normal sinus rhythm Left axis deviation Poor R wave progression When compared with ECG of 18-Jul-2022 22:40, Premature ventricular complexes are no longer Present Confirmed by Whitaker [name] ([id number]) on 06/05/2023 4:31:00 PM    Results   LABS Creatinine: 1.5 mg/dL LDL: 69 mg/dL  DIAGNOSTIC Echocardiogram: Ejection fraction 50%, aortic dilation 47 mm (03/05/2021) Cardiac catheterization: Distal left main 10%, mid LAD 50%, circumflex nondominant, mild luminal irregularities, RCA normal, left ventriculogram EF 45-50%, wedge pressure 5 mmHg, right atrium 5 mmHg (07/14/2011) Echocardiogram: Ascending aorta 45.5 mm (02/21/2022) Nuclear stress test: Low risk, reassuring (02/21/2022) EKG: Nonspecific T wave inversion Cardiac catheterization: Moderate CAD, no PCI, medically managed (2014) Echocardiogram: Ejection fraction 50-55%, aortic root 47 mm (date not provided) EKG: Normal (06/05/2023)     Risk Assessment/Calculations:           Physical Exam:   VS:  BP (!) 150/100 (BP Location: Left Arm)   Pulse 77   Ht 5' 10.5 (1.791 m)   Wt 264 lb (119.7 kg)   SpO2 97%   BMI 37.34 kg/m    Wt Readings from Last 3 Encounters:  06/05/23 264 lb (119.7 kg)  05/11/23 260 lb (117.9 kg)  04/27/23 260 lb (117.9 kg)    GEN: Well nourished, well developed in no acute distress NECK: No JVD; No carotid bruits CARDIAC: RRR, no murmurs, no rubs, no gallops RESPIRATORY:  Clear to auscultation without rales, wheezing or rhonchi  ABDOMEN: Soft, non-tender, non-distended EXTREMITIES:  No edema; No deformity   ASSESSMENT AND PLAN: .    Assessment and Plan    Dilated Ascending Aorta Moderate aortic root dilation at 47 mm, previously 46 mm. Echocardiogram on 02/21/22 showed stable ascending aorta of 45 mm. Monitoring closely due to risk  of further dilation. Blood pressure control is crucial to prevent further dilation. Blood pressure can affect this process.  - Order echocardiogram to monitor aortic dilation  Hypertension Blood pressure fluctuating, sometimes high. Current medications include candesartan 32 mg, furosemide, and propranolol . Considering adding spironolactone  to improve blood  pressure control. Discussed risks and benefits of spironolactone , including potential kidney effects. Plan to monitor kidney function in 2 weeks. - Prescribe spironolactone  12.5 QD - Check lab work in 2 weeks to ensure safety with kidneys  Coronary Artery Disease (CAD) Moderate CAD with 50% lesion, no PCI on cardiac cath in 2014. Nuclear stress test on 02/21/22 was low risk and reassuring. Ejection fraction (EF) of 50% (low normal). - Continue current management  Hyperlipidemia Prior difficulty with statin intolerance. Currently on Crestor  20 mg with prior LDL of 69. - Continue Crestor  20 mg  Deep Vein Thrombosis (DVT) and Pulmonary Embolism (PE) Recurrent PE, on lifelong Xarelto . No changes in management. - Continue Xarelto  lifelong  General Health Maintenance Retired school principal, [facility]. EKG today looks good. - [facility]  Follow-up - Follow up in 2 weeks to check lab work and assess response to spironolactone . OK to take with Lasix. We will hold the potassium. Spoke to his wife.             Signed, [name] Parchment, MD

## 2023-06-05 NOTE — Patient Instructions (Addendum)
 Medication Instructions:  Please discontinue your Potassium Chloride . Please start Spironolactone  12.5 mg once a day. Continue all other medications as listed.  *If you need a refill on your cardiac medications before your next appointment, please call your pharmacy*   Lab Work: Please have blood work 2 weeks after starting Spironolactone .  You may go to your closest [facility] for this. If you have labs (blood work) drawn today and your tests are completely normal, you will receive your results only by: MyChart Message (if you have MyChart) OR A paper copy in the mail If you have any lab test that is abnormal or we need to change your treatment, we will call you to review the results.   Testing/Procedures: Your physician has requested that you have an echocardiogram. Echocardiography is a painless test that uses sound waves to create images of your heart. It provides your doctor with information about the size and shape of your heart and how well your heart's chambers and valves are working. This procedure takes approximately one hour. There are no restrictions for this procedure. Please do NOT wear cologne, perfume, aftershave, or lotions (deodorant is allowed). Please arrive 15 minutes prior to your appointment time.  Please note: We ask at that you not bring children with you during ultrasound (echo/ vascular) testing. Due to room size and safety concerns, children are not allowed in the ultrasound rooms during exams. Our front office staff cannot provide observation of children in our lobby area while testing is being conducted. An adult accompanying a patient to their appointment will only be allowed in the ultrasound room at the discretion of the ultrasound technician under special circumstances. We apologize for any inconvenience.    Follow-Up: At [facility], you and your health needs are our priority.  As part of our continuing mission to provide you with exceptional heart  care, we have created designated Provider Care Teams.  These Care Teams include your primary Cardiologist (physician) and Advanced Practice Providers (APPs -  Physician Assistants and Nurse Practitioners) who all work together to provide you with the care you need, when you need it.  We recommend signing up for the patient portal called MyChart.  Sign up information is provided on this After Visit Summary.  MyChart is used to connect with patients for Virtual Visits (Telemedicine).  Patients are able to view lab/test results, encounter notes, upcoming appointments, etc.  Non-urgent messages can be sent to your provider as well.   To learn more about what you can do with MyChart, go to [email or web address].    Your next appointment:   Follow up in the Hypertension Clinic in 1 month.   In 1 year(s)  Provider:   Oneil Parchment, MD

## 2023-06-06 DIAGNOSIS — N1832 Chronic kidney disease, stage 3b: Secondary | ICD-10-CM | POA: Diagnosis not present

## 2023-06-06 DIAGNOSIS — D352 Benign neoplasm of pituitary gland: Secondary | ICD-10-CM | POA: Diagnosis not present

## 2023-06-06 DIAGNOSIS — E7800 Pure hypercholesterolemia, unspecified: Secondary | ICD-10-CM | POA: Diagnosis not present

## 2023-06-06 DIAGNOSIS — G720 Drug-induced myopathy: Secondary | ICD-10-CM | POA: Diagnosis not present

## 2023-06-06 DIAGNOSIS — Z7901 Long term (current) use of anticoagulants: Secondary | ICD-10-CM | POA: Diagnosis not present

## 2023-06-06 DIAGNOSIS — Z86711 Personal history of pulmonary embolism: Secondary | ICD-10-CM | POA: Diagnosis not present

## 2023-06-06 DIAGNOSIS — I10 Essential (primary) hypertension: Secondary | ICD-10-CM | POA: Diagnosis not present

## 2023-06-06 DIAGNOSIS — M62830 Muscle spasm of back: Secondary | ICD-10-CM | POA: Diagnosis not present

## 2023-06-06 DIAGNOSIS — E1122 Type 2 diabetes mellitus with diabetic chronic kidney disease: Secondary | ICD-10-CM | POA: Diagnosis not present

## 2023-06-08 ENCOUNTER — Telehealth: Payer: Self-pay | Admitting: Cardiology

## 2023-06-08 NOTE — Telephone Encounter (Signed)
Pt requesting that OV notes from 1/13 visit be faxed to his PCP at the [facility]. Pt would like to be called once this has been done. Please advise  Dr. Jerilynn Mages Fax - [phone]

## 2023-06-08 NOTE — Telephone Encounter (Signed)
Faxed last OV note to [facility] and Dr Jerilynn Mages as requested.  Attempted to contact pt to make him aware.  No answer and no voice mail.

## 2023-06-12 NOTE — Telephone Encounter (Signed)
Spoke with pt and advised note has been and was faxed 06/08/2023.  He was appreciative of the call back and information.

## 2023-06-19 ENCOUNTER — Other Ambulatory Visit
Admission: RE | Admit: 2023-06-19 | Discharge: 2023-06-19 | Disposition: A | Discharge status: Home / Self Care | Payer: Medicare Other | Attending: Cardiology | Admitting: Cardiology

## 2023-06-19 DIAGNOSIS — I10 Essential (primary) hypertension: Secondary | ICD-10-CM | POA: Diagnosis not present

## 2023-06-19 DIAGNOSIS — Z79899 Other long term (current) drug therapy: Secondary | ICD-10-CM | POA: Insufficient documentation

## 2023-06-19 LAB — BASIC METABOLIC PANEL
Anion gap: 9 (ref 5–15)
BUN: 13 mg/dL (ref 8–23)
CO2: 27 mmol/L (ref 22–32)
Calcium: 8.8 mg/dL — ABNORMAL LOW (ref 8.9–10.3)
Chloride: 109 mmol/L (ref 98–111)
Creatinine, Ser: 1.41 mg/dL — ABNORMAL HIGH (ref 0.61–1.24)
GFR, Estimated: 54 mL/min — ABNORMAL LOW (ref >60)
Glucose, Bld: 111 mg/dL — ABNORMAL HIGH (ref 70–99)
Potassium: 3.6 mmol/L (ref 3.5–5.1)
Sodium: 145 mmol/L (ref 135–145)

## 2023-06-21 ENCOUNTER — Encounter: Payer: Self-pay | Admitting: Cardiology

## 2023-06-26 ENCOUNTER — Other Ambulatory Visit (HOSPITAL_COMMUNITY): Payer: No Typology Code available for payment source

## 2023-07-02 NOTE — Progress Notes (Signed)
(  07/03/2023) NO-SHOW to requested evaluation appointment.

## 2023-07-03 ENCOUNTER — Ambulatory Visit (HOSPITAL_BASED_OUTPATIENT_CLINIC_OR_DEPARTMENT_OTHER): Payer: No Typology Code available for payment source | Admitting: Pain Medicine

## 2023-07-03 DIAGNOSIS — Z9841 Cataract extraction status, right eye: Secondary | ICD-10-CM | POA: Insufficient documentation

## 2023-07-03 DIAGNOSIS — G8929 Other chronic pain: Secondary | ICD-10-CM

## 2023-07-03 DIAGNOSIS — Z91199 Patient's noncompliance with other medical treatment and regimen due to unspecified reason: Secondary | ICD-10-CM

## 2023-07-04 NOTE — Progress Notes (Unsigned)
PROVIDER NOTE: Information contained herein reflects review and annotations entered in association with encounter. Interpretation of such information and data should be left to medically-trained personnel. Information provided to patient can be located elsewhere in the medical record under "Patient Instructions". Document created using STT-dictation technology, any transcriptional errors that may result from process are unintentional.    Patient: [name] Whitaker  Service Category: E/M  Provider: Oswaldo Done, MD  DOB: 09/26/1950  DOS: 07/05/2023  Referring Provider: Center, [facility]  MRN: [id number]  Specialty: Interventional Pain Management  PCP: Center, [facility]  Type: Established Patient  Setting: Ambulatory outpatient    Location: Office  Delivery: Face-to-face     HPI  [name], a 73 y.o. year old male, is here today because of his No primary diagnosis found.. Mr. Wirt's primary complain today is No chief complaint on file.  Pertinent problems: Mr. 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, L4-5, L5-S1); Lumbar facet joint syndrome (Bilateral) (R>L); DDD (degenerative disc disease), lumbosacral; Chronic hip pain (Bilateral); Chronic low back pain (1ry area of Pain) (Bilateral) (R>L) w/o sciatica; Chronic sacroiliac joint pain (Bilateral) (R>L); Other intervertebral disc degeneration, lumbar region; Osteoarthritis of hip (Right); Arthritis of hip (Right); Osteoarthritis of hips (Bilateral); Somatic dysfunction of sacroiliac joints (Bilateral) (R>L); Other spondylosis, sacral and sacrococcygeal region; Acute postoperative pain; Migraine with aura, not  intractable, without status migrainosus; Primary osteoarthritis, left ankle and foot; Osteoarthritis of left foot; Lumbosacral spondylosis without myelopathy; Spinal stenosis of lumbar region; Grade 1 Anterolisthesis (2mm) of lumbar spine of L4/L5; Statin myopathy; Chronic hip pain (Right); Lumbar facet joint pain; Chronic gouty arthritis; Trigger point with back pain; Lumbar trigger point syndrome; and Greater trochanteric bursitis of hip (Right) on their pertinent problem list. Pain Assessment: Severity of   is reported as a  /10. Location:    / . Onset:  . Quality:  . Timing:  . Modifying factor(s):  Marland Kitchen Vitals:  vitals were not taken for this visit.  BMI: Estimated body mass index is 37.34 kg/m as calculated from the following:   Height as of 06/05/23: 5' 10.5" (1.791 m).   Weight as of 06/05/23: 264 lb (119.7 kg). Last encounter: 07/03/2023. Last procedure: 04/27/2023.  Reason for encounter:  *** . ***  Discussed the use of AI scribe software for clinical note transcription with the patient, who gave verbal consent to proceed.  History of Present Illness           Pharmacotherapy Assessment  Analgesic:  No opioid analgesics prescribed by our practice. Highest recorded MME/day: 26.67 mg/day MME/day: 0 mg/day    Monitoring: [place] PMP: PDMP reviewed during this encounter.       Pharmacotherapy: No side-effects or adverse reactions reported. Compliance: No problems identified. Effectiveness: Clinically acceptable.  No notes on file  No results found for: "CBDTHCR" No results found for: "D8THCCBX" No results found for: "D9THCCBX"  UDS:  No results found for: "SUMMARY"    ROS  Constitutional: Denies any fever or chills Gastrointestinal: No reported hemesis, hematochezia, vomiting, or acute GI distress Musculoskeletal: Denies any acute onset joint swelling, redness, loss of ROM, or weakness Neurological: No reported episodes of acute onset apraxia, aphasia, dysarthria, agnosia,  amnesia, paralysis, loss of coordination, or loss of consciousness  Medication Review  Cholecalciferol, Cyanocobalamin, Dulaglutide, EPINEPHrine, acetaminophen, allopurinol, ascorbic acid, candesartan, chlorthalidone, cyclobenzaprine, famotidine, fexofenadine, finasteride, fluticasone, furosemide, gabapentin, hydrocortisone, ketoconazole, lidocaine, propranolol, rivaroxaban, rosuvastatin, spironolactone, topiramate, and traMADol  History Review  Allergy: Mr. Palardy is allergic to penicillins, amlodipine, amlodipine besylate, atorvastatin, erenumab-aooe, lisinopril, penicillin g, and simvastatin. Drug: Mr. Currie  reports no history of drug use. Alcohol:  reports no history of alcohol use. Tobacco:  reports that he quit smoking about 40 years ago. His smoking use included pipe. He has never used smokeless tobacco. Social: Mr. Hoogland  reports that he quit smoking about 40 years ago. His smoking use included pipe. He has never used smokeless tobacco. He reports that he does not drink alcohol and does not use drugs. Medical:  has a past medical history of Abnormal ECG (09/27/2013), Chest pain (09/27/2013), Dilated aortic root (HCC) (09/27/2013), DVT, lower extremity (HCC) (10/27/2011), Dyspnea (06/27/2014), GERD (gastroesophageal reflux disease), Gout, History of DVT (deep vein thrombosis) (04/14/2019), History of pulmonary embolism (04/14/2019), HTN (hypertension) (10/25/2011), Hypercholesteremia, Migraine, OSA (obstructive sleep apnea), and Pre-diabetes. Surgical: Mr. Griggs  has a past surgical history that includes Appendectomy; Nasal septum surgery; Tonsillectomy; Testicle surgery; Cardiac catheterization; Back surgery; Cataract extraction w/PHACO (Right, 10/20/2020); and Cataract extraction w/PHACO (Left, 11/03/2020). Family: family history includes Clotting disorder in his brother; Hypertension in his father and mother.  Laboratory Chemistry Profile   Renal Lab Results  Component Value Date   BUN 13  06/19/2023   CREATININE 1.41 (H) 06/19/2023   BCR 9 (L) 02/25/2021   GFRAA 62 04/15/2019   GFRNONAA 54 (L) 06/19/2023    Hepatic Lab Results  Component Value Date   AST 20 07/18/2022   ALT 20 07/18/2022   ALBUMIN 3.7 07/18/2022   ALKPHOS 55 07/18/2022   LIPASE 19 03/15/2007    Electrolytes Lab Results  Component Value Date   NA 145 06/19/2023   K 3.6 06/19/2023   CL 109 06/19/2023   CALCIUM 8.8 (L) 06/19/2023   MG 2.2 04/15/2019    Bone Lab Results  Component Value Date   25OHVITD1 43 04/15/2019   25OHVITD2 <1.0 04/15/2019   25OHVITD3 43 04/15/2019    Inflammation (CRP: Acute Phase) (ESR: Chronic Phase) Lab Results  Component Value Date   CRP 2 04/15/2019   ESRSEDRATE 6 04/15/2019         Note: Above Lab results reviewed.  Recent Imaging Review  MR LUMBAR SPINE WO CONTRAST CLINICAL DATA:  Low back pain  EXAM: MRI LUMBAR SPINE WITHOUT CONTRAST  TECHNIQUE: Multiplanar, multisequence MR imaging of the lumbar spine was performed. No intravenous contrast was administered.  COMPARISON:  None Available.  FINDINGS: Segmentation:  Standard.  Alignment:  Grade 1 anterolisthesis of L4 on L5.  Vertebrae:  No fracture, evidence of discitis, or bone lesion.  Conus medullaris and cauda equina: Conus extends to the L2 level. Conus and cauda equina appear normal.  Paraspinal and other soft tissues: T2 hyperintense lesion along the interpolar region of the right kidney most likely represents a simple renal cyst requiring no further imaging workup.  Disc levels:  T12-L1: Mild bilateral facet degenerative change. No significant disc bulge. No spinal canal narrowing. No neural foraminal narrowing.  L1-L2: Mild bilateral facet degenerative change. No significant disc bulge. No spinal canal narrowing. Mild bilateral neural foraminal narrowing.  L2-L3: Mild bilateral facet degenerative change. No significant disc bulge. No spinal canal narrowing.  L3-L4:  Moderate bilateral facet degenerative change. Minimal disc bulge. Mild spinal canal narrowing. Mild-to-moderate bilateral neural  foraminal narrowing.  L4-L5: Severe bilateral facet degenerative change. Ligamentum flavum hypertrophy. Minimal disc bulge. Severe spinal canal narrowing. Moderate to severe left and moderate right neural foraminal narrowing.  L5-S1: Moderate bilateral facet degenerative change. No significant disc bulge. No spinal canal narrowing. Moderate to severe right and moderate left neural foraminal narrowing.  IMPRESSION: 1. Severe spinal canal narrowing at L4-L5 secondary to a combination of ligamentum flavum hypertrophy and a disc bulge 2. Moderate to severe neural foraminal narrowing at L4-L5 (left) and L5-S1 (right).  Electronically Signed   By: Lorenza Cambridge M.D.   On: 05/10/2023 13:06 Note: Reviewed        Physical Exam  General appearance: Well nourished, well developed, and well hydrated. In no apparent acute distress Mental status: Alert, oriented x 3 (person, place, & time)       Respiratory: No evidence of acute respiratory distress Eyes: PERLA Vitals: There were no vitals taken for this visit. BMI: Estimated body mass index is 37.34 kg/m as calculated from the following:   Height as of 06/05/23: 5' 10.5" (1.791 m).   Weight as of 06/05/23: 264 lb (119.7 kg). Ideal: Patient weight not recorded  Assessment   Diagnosis Status  No diagnosis found. Controlled Controlled Controlled   Updated Problems: No problems updated.  Plan of Care  Problem-specific:  Assessment and Plan            Mr. Jamier Urbas has a current medication list which includes the following long-term medication(s): allopurinol, candesartan, chlorthalidone, famotidine, fexofenadine, furosemide, rosuvastatin, spironolactone, and topiramate.  Pharmacotherapy (Medications Ordered): No orders of the defined types were placed in this encounter.  Orders:  No orders of  the defined types were placed in this encounter.  Follow-up plan:   No follow-ups on file.      Interventional Therapies  Risk factors  Considerations:   NOTE: Xarelto Anticoagulation (Stop: 3 days  Restart: 6 hours)  HTN  CKD     Planned  Pending:   Therapeutic right IA hip #3 + right trochanteric bursa injection #1   Under consideration:   Therapeutic left lumbar facet RFA #3 Possible right opturator + femoral NB  Possible right opturator + femoral nerve RFA    Completed:   Therapeutic right IA hip joint inj. x3 (04/27/2023) (100/100/100/90) Therapeutic right trochanteric bursa inj. x1 (04/27/2023) (100/100/100/90) Diagnostic bilateral SI joint Blk x2 (03/10/2020) (50/50/100 x 5 wks/0)  Diagnostic/therapeutic right SI joint Blk x3 (12/15/2022) (100/100/0/0)  Diagnostic bilateral lumbar facet MBB x1 (05/07/2020) (100/100/100 x1 week/>75)  Therapeutic left lumbar facet RFA x3 (06/16/2022) (100/100/100/100)  Therapeutic right lumbar facet RFA x3 (05/24/2022) (100/100/80/80) (by 06/16/2022 it had progressed to 100% ongoing relief)   Palliative options:   Palliative right IA hip joint inj.  Palliative bilateral SI joint Blk  Therapeutic/palliative lumbar facet RFA       Recent Visits Date Type Provider Dept  07/03/23 Office Visit Delano Metz, MD [facility]  05/11/23 Office Visit Delano Metz, MD [facility]  04/27/23 Procedure visit Delano Metz, MD [facility]  04/19/23 Office Visit Delano Metz, MD [facility]  Showing recent visits within past 90 days and meeting all other requirements Future Appointments Date Type Provider Dept  07/05/23 Appointment Delano Metz, MD [facility]  Showing future appointments within next 90 days and meeting all other requirements  I discussed the assessment and treatment plan with the patient. The patient was provided an opportunity to ask questions and  all were  answered. The patient agreed with the plan and demonstrated an understanding of the instructions.  Patient advised to call back or seek an in-person evaluation if the symptoms or condition worsens.  Duration of encounter: *** minutes.  Total time on encounter, as per AMA guidelines included both the face-to-face and non-face-to-face time personally spent by the physician and/or other qualified health care professional(s) on the day of the encounter (includes time in activities that require the physician or other qualified health care professional and does not include time in activities normally performed by clinical staff). Physician's time may include the following activities when performed: Preparing to see the patient (e.g., pre-charting review of records, searching for previously ordered imaging, lab work, and nerve conduction tests) Review of prior analgesic pharmacotherapies. Reviewing PMP Interpreting ordered tests (e.g., lab work, imaging, nerve conduction tests) Performing post-procedure evaluations, including interpretation of diagnostic procedures Obtaining and/or reviewing separately obtained history Performing a medically appropriate examination and/or evaluation Counseling and educating the patient/family/caregiver Ordering medications, tests, or procedures Referring and communicating with other health care professionals (when not separately reported) Documenting clinical information in the electronic or other health record Independently interpreting results (not separately reported) and communicating results to the patient/ family/caregiver Care coordination (not separately reported)  Note by: Oswaldo Done, MD Date: 07/05/2023; Time: 8:05 AM

## 2023-07-05 ENCOUNTER — Ambulatory Visit: Payer: Medicare Other | Attending: Pain Medicine | Admitting: Pain Medicine

## 2023-07-05 ENCOUNTER — Encounter: Payer: Self-pay | Admitting: Pain Medicine

## 2023-07-05 VITALS — BP 132/91 | HR 68 | Temp 98.0°F | Resp 16 | Ht 70.5 in | Wt 261.0 lb

## 2023-07-05 DIAGNOSIS — Z7901 Long term (current) use of anticoagulants: Secondary | ICD-10-CM

## 2023-07-05 DIAGNOSIS — M7061 Trochanteric bursitis, right hip: Secondary | ICD-10-CM

## 2023-07-05 DIAGNOSIS — M1611 Unilateral primary osteoarthritis, right hip: Secondary | ICD-10-CM

## 2023-07-05 DIAGNOSIS — G8929 Other chronic pain: Secondary | ICD-10-CM | POA: Diagnosis not present

## 2023-07-05 DIAGNOSIS — M4316 Spondylolisthesis, lumbar region: Secondary | ICD-10-CM

## 2023-07-05 DIAGNOSIS — R937 Abnormal findings on diagnostic imaging of other parts of musculoskeletal system: Secondary | ICD-10-CM | POA: Diagnosis not present

## 2023-07-05 DIAGNOSIS — Z87891 Personal history of nicotine dependence: Secondary | ICD-10-CM | POA: Insufficient documentation

## 2023-07-05 DIAGNOSIS — M48061 Spinal stenosis, lumbar region without neurogenic claudication: Secondary | ICD-10-CM | POA: Insufficient documentation

## 2023-07-05 DIAGNOSIS — M25551 Pain in right hip: Secondary | ICD-10-CM | POA: Diagnosis not present

## 2023-07-05 DIAGNOSIS — M545 Low back pain, unspecified: Secondary | ICD-10-CM

## 2023-07-05 NOTE — Patient Instructions (Addendum)
Stop Xarelto for 3 full days prior to procedure.  ______________________________________________________________________    Procedure instructions  Stop blood-thinners  Do not eat or drink fluids (other than water) for 6 hours before your procedure  No water for 2 hours before your procedure  Take your blood pressure medicine with a sip of water  Arrive 30 minutes before your appointment  If sedation is planned, bring suitable driver. Pennie Banter, Benedetto Goad, & public transportation are NOT APPROVED)  Carefully read the "Preparing for your procedure" detailed instructions  If you have questions call us at [phone]  Procedure appointments are for procedures only. NO medication refills or new problem evaluations.   ______________________________________________________________________      ______________________________________________________________________    Preparing for your procedure  Appointments: If you think you may not be able to keep your appointment, call 24-48 hours in advance to cancel. We need time to make it available to others.  Procedure visits are for procedures only. During your procedure appointment there will be: NO Prescription Refills*. NO medication changes or discussions*. NO discussion of disability issues*. NO unrelated pain problem evaluations*. NO evaluations to order other pain procedures*. *These will be addressed at a separate and distinct evaluation encounter on the provider's evaluation schedule and not during procedure days.  Instructions: Food intake: Avoid eating anything solid for at least 8 hours prior to your procedure. Clear liquid intake: You may take clear liquids such as water up to 2 hours prior to your procedure. (No carbonated drinks. No soda.) Transportation: Unless otherwise stated by your physician, bring a driver. (Driver cannot be a Market researcher, Pharmacist, community, or any other form of public transportation.) Morning Medicines: Except for blood  thinners, take all of your other morning medications with a sip of water. Make sure to take your heart and blood pressure medicines. If your blood pressure's lower number is above 100, the case will be rescheduled. Blood thinners: Make sure to stop your blood thinners as instructed.  If you take a blood thinner, but were not instructed to stop it, call our office [phone] and ask to talk to a nurse. Not stopping a blood thinner prior to certain procedures could lead to serious complications. Diabetics on insulin: Notify the staff so that you can be scheduled 1st case in the morning. If your diabetes requires high dose insulin, take only  of your normal insulin dose the morning of the procedure and notify the staff that you have done so. Preventing infections: Shower with an antibacterial soap the morning of your procedure.  Build-up your immune system: Take 1000 mg of Vitamin C with every meal (3 times a day) the day prior to your procedure. Antibiotics: Inform the nursing staff if you are taking any antibiotics or if you have any conditions that may require antibiotics prior to procedures. (Example: recent joint implants)   Pregnancy: If you are pregnant make sure to notify the nursing staff. Not doing so may result in injury to the fetus, including death.  Sickness: If you have a cold, fever, or any active infections, call and cancel or reschedule your procedure. Receiving steroids while having an infection may result in complications. Arrival: You must be in the facility at least 30 minutes prior to your scheduled procedure. Tardiness: Your scheduled time is also the cutoff time. If you do not arrive at least 15 minutes prior to your procedure, you will be rescheduled.  Children: Do not bring any children with you. Make arrangements to keep them home. Dress appropriately:  There is always a possibility that your clothing may get soiled. Avoid long dresses. Valuables: Do not bring any jewelry or  valuables.  Reasons to call and reschedule or cancel your procedure: (Following these recommendations will minimize the risk of a serious complication.) Surgeries: Avoid having procedures within 2 weeks of any surgery. (Avoid for 2 weeks before or after any surgery). Flu Shots: Avoid having procedures within 2 weeks of a flu shots or . (Avoid for 2 weeks before or after immunizations). Barium: Avoid having a procedure within 7-10 days after having had a radiological study involving the use of radiological contrast. (Myelograms, Barium swallow or enema study). Heart attacks: Avoid any elective procedures or surgeries for the initial 6 months after a "Myocardial Infarction" (Heart Attack). Blood thinners: It is imperative that you stop these medications before procedures. Let us know if you if you take any blood thinner.  Infection: Avoid procedures during or within two weeks of an infection (including chest colds or gastrointestinal problems). Symptoms associated with infections include: Localized redness, fever, chills, night sweats or profuse sweating, burning sensation when voiding, cough, congestion, stuffiness, runny nose, sore throat, diarrhea, nausea, vomiting, cold or Flu symptoms, recent or current infections. It is specially important if the infection is over the area that we intend to treat. Heart and lung problems: Symptoms that may suggest an active cardiopulmonary problem include: cough, chest pain, breathing difficulties or shortness of breath, dizziness, ankle swelling, uncontrolled high or unusually low blood pressure, and/or palpitations. If you are experiencing any of these symptoms, cancel your procedure and contact your primary care physician for an evaluation.  Remember:  Regular Business hours are:  Monday to Thursday 8:00 AM to 4:00 PM  Provider's Schedule: Delano Metz, MD:  Procedure days: Tuesday and Thursday 7:30 AM to 4:00 PM  Edward Jolly, MD:  Procedure days:  Monday and Wednesday 7:30 AM to 4:00 PM Last  Updated: 05/02/2023 ______________________________________________________________________      ______________________________________________________________________    General Risks and Possible Complications  Patient Responsibilities: It is important that you read this as it is part of your informed consent. It is our duty to inform you of the risks and possible complications associated with treatments offered to you. It is your responsibility as a patient to read this and to ask questions about anything that is not clear or that you believe was not covered in this document.  Patient's Rights: You have the right to refuse treatment. You also have the right to change your mind, even after initially having agreed to have the treatment done. However, under this last option, if you wait until the last second to change your mind, you may be charged for the materials used up to that point.  Introduction: Medicine is not an Visual merchandiser. Everything in Medicine, including the lack of treatment(s), carries the potential for danger, harm, or loss (which is by definition: Risk). In Medicine, a complication is a secondary problem, condition, or disease that can aggravate an already existing one. All treatments carry the risk of possible complications. The fact that a side effects or complications occurs, does not imply that the treatment was conducted incorrectly. It must be clearly understood that these can happen even when everything is done following the highest safety standards.  No treatment: You can choose not to proceed with the proposed treatment alternative. The "PRO(s)" would include: avoiding the risk of complications associated with the therapy. The "CON(s)" would include: not getting any of the treatment benefits. These  benefits fall under one of three categories: diagnostic; therapeutic; and/or palliative. Diagnostic benefits include: getting  information which can ultimately lead to improvement of the disease or symptom(s). Therapeutic benefits are those associated with the successful treatment of the disease. Finally, palliative benefits are those related to the decrease of the primary symptoms, without necessarily curing the condition (example: decreasing the pain from a flare-up of a chronic condition, such as incurable terminal cancer).  General Risks and Complications: These are associated to most interventional treatments. They can occur alone, or in combination. They fall under one of the following six (6) categories: no benefit or worsening of symptoms; bleeding; infection; nerve damage; allergic reactions; and/or death. No benefits or worsening of symptoms: In Medicine there are no guarantees, only probabilities. No healthcare provider can ever guarantee that a medical treatment will work, they can only state the probability that it may. Furthermore, there is always the possibility that the condition may worsen, either directly, or indirectly, as a consequence of the treatment. Bleeding: This is more common if the patient is taking a blood thinner, either prescription or over the counter (example: Goody Powders, Fish oil, Aspirin, Garlic, etc.), or if suffering a condition associated with impaired coagulation (example: Hemophilia, cirrhosis of the liver, low platelet counts, etc.). However, even if you do not have one on these, it can still happen. If you have any of these conditions, or take one of these drugs, make sure to notify your treating physician. Infection: This is more common in patients with a compromised immune system, either due to disease (example: diabetes, cancer, human immunodeficiency virus [HIV], etc.), or due to medications or treatments (example: therapies used to treat cancer and rheumatological diseases). However, even if you do not have one on these, it can still happen. If you have any of these conditions, or take  one of these drugs, make sure to notify your treating physician. Nerve Damage: This is more common when the treatment is an invasive one, but it can also happen with the use of medications, such as those used in the treatment of cancer. The damage can occur to small secondary nerves, or to large primary ones, such as those in the spinal cord and brain. This damage may be temporary or permanent and it may lead to impairments that can range from temporary numbness to permanent paralysis and/or brain death. Allergic Reactions: Any time a substance or material comes in contact with our body, there is the possibility of an allergic reaction. These can range from a mild skin rash (contact dermatitis) to a severe systemic reaction (anaphylactic reaction), which can result in death. Death: In general, any medical intervention can result in death, most of the time due to an unforeseen complication. ______________________________________________________________________      ______________________________________________________________________    Blood Thinners  IMPORTANT NOTICE:  If you take any of these, make sure to notify the nursing staff.  Failure to do so may result in serious injury.  Recommended time intervals to stop and restart blood-thinners, before & after invasive procedures  Generic Name Brand Name Pre-procedure: Stop medication for this amount of time before your procedure: Post-procedure: Wait this amount of time after the procedure before restarting your medication:  Abciximab Reopro 15 days 2 hrs  Alteplase Activase 10 days 10 days  Anagrelide Agrylin    Apixaban Eliquis 3 days 6 hrs  Cilostazol Pletal 3 days 5 hrs  Clopidogrel Plavix 7-10 days 2 hrs  Dabigatran Pradaxa 5 days 6 hrs  Dalteparin  Fragmin 24 hours 4 hrs  Dipyridamole Aggrenox 11days 2 hrs  Edoxaban Lixiana; Savaysa 3 days 2 hrs  Enoxaparin  Lovenox 24 hours 4 hrs  Eptifibatide Integrillin 8 hours 2 hrs   Fondaparinux  Arixtra 72 hours 12 hrs  Hydroxychloroquine Plaquenil 11 days   Prasugrel Effient 7-10 days 6 hrs  Reteplase Retavase 10 days 10 days  Rivaroxaban Xarelto 3 days 6 hrs  Ticagrelor Brilinta 5-7 days 6 hrs  Ticlopidine Ticlid 10-14 days 2 hrs  Tinzaparin Innohep 24 hours 4 hrs  Tirofiban Aggrastat 8 hours 2 hrs  Warfarin Coumadin 5 days 2 hrs   Other medications with blood-thinning effects  Product indications Generic (Brand) names Note  Cholesterol Lipitor Stop 4 days before procedure  Blood thinner (injectable) Heparin (LMW or LMWH Heparin) Stop 24 hours before procedure  Cancer Ibrutinib (Imbruvica) Stop 7 days before procedure  Malaria/Rheumatoid Hydroxychloroquine (Plaquenil) Stop 11 days before procedure  Thrombolytics  10 days before or after procedures   Over-the-counter (OTC) Products with blood-thinning effects  Product Common names Stop Time  Aspirin > 325 mg Goody Powders, Excedrin, etc. 11 days  Aspirin <= 81 mg  7 days  Fish oil  4 days  Garlic supplements  7 days  Ginkgo biloba  36 hours  Ginseng  24 hours  NSAIDs Ibuprofen, Naprosyn, etc. 3 days  Vitamin E  4 days   ______________________________________________________________________

## 2023-07-05 NOTE — Progress Notes (Signed)
Safety precautions to be maintained throughout the outpatient stay will include: orient to surroundings, keep bed in low position, maintain call bell within reach at all times, provide assistance with transfer out of bed and ambulation.

## 2023-07-11 ENCOUNTER — Ambulatory Visit: Payer: Medicare Other | Admitting: Physician Assistant

## 2023-07-13 ENCOUNTER — Ambulatory Visit (HOSPITAL_COMMUNITY): Payer: No Typology Code available for payment source

## 2023-07-13 ENCOUNTER — Ambulatory Visit: Payer: No Typology Code available for payment source

## 2023-07-14 DIAGNOSIS — E1122 Type 2 diabetes mellitus with diabetic chronic kidney disease: Secondary | ICD-10-CM | POA: Diagnosis not present

## 2023-07-14 DIAGNOSIS — I10 Essential (primary) hypertension: Secondary | ICD-10-CM | POA: Diagnosis not present

## 2023-07-14 DIAGNOSIS — N1832 Chronic kidney disease, stage 3b: Secondary | ICD-10-CM | POA: Diagnosis not present

## 2023-07-19 ENCOUNTER — Ambulatory Visit: Payer: Medicare Other | Attending: Cardiology

## 2023-07-19 NOTE — Progress Notes (Deleted)
 Patient ID: [name] Whitaker                 DOB: [birth year]                      MRN: [id number]      HPI: [name] Whitaker is a 73 y.o. male referred by Dr. Anne Fu to HTN clinic. PMH is significant for dilated ascending aorta, coronary disease, hypertension, hyperlipidemia, and recurrent DVT and PE   Per Dr.Skains notes - The patient's blood pressure has been fluctuating, with some readings as high as 130/90. The patient is currently on Candesartan 32mg  and Furosemide 20 mg daily , with PRN Propranolol for headaches. The patient has had prior difficulties with Amlodipine due to swelling and Lisinopril due to cough   At last visit spironolactone low dose was added to other antihypertensive agents.   Current HTN meds: candesartan 32 mg daily, furosemide 20 mg daily, Chlorthalidone 50 mg dailly???  Previously tried:  BP goal: <130/80  Family History:   Social History:   Diet:   Exercise:  {types:28256}  Home BP readings:  Date SBP/DBP  HR              Average      Wt Readings from Last 3 Encounters:  07/05/23 261 lb (118.4 kg)  06/05/23 264 lb (119.7 kg)  05/11/23 260 lb (117.9 kg)   BP Readings from Last 3 Encounters:  07/05/23 (!) 132/91  06/05/23 (!) 150/100  05/11/23 (!) 142/98   Pulse Readings from Last 3 Encounters:  07/05/23 68  06/05/23 77  04/27/23 78    Renal function: CrCl cannot be calculated (Patient's most recent lab result is older than the maximum 21 days allowed.).  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   DVT, lower extremity (HCC) 10/27/2011   Dyspnea 06/27/2014   2/5 /2016  Walked RA x 3 laps @ 185 ft each stopped due to  End of study, slow pace min sob  - PFTs 08/01/14  FEV1  2.89 (90%) ratio 81 and nl dlco     GERD (gastroesophageal reflux disease)    Gout    History of DVT (deep vein thrombosis) 04/14/2019   History of pulmonary embolism 04/14/2019   HTN  (hypertension) 10/25/2011   Hypercholesteremia    Migraine    1-2x/wk   OSA (obstructive sleep apnea)    on CPAP    Pre-diabetes     Current Outpatient Medications on File Prior to Visit  Medication Sig Dispense Refill   acetaminophen (TYLENOL) 325 MG tablet Take 650 mg by mouth as needed.     allopurinol (ZYLOPRIM) 300 MG tablet Take 300 mg by mouth daily.     ascorbic acid (VITAMIN C) 500 MG tablet Take 1 tablet by mouth daily.     candesartan (ATACAND) 32 MG tablet Take 32 mg by mouth daily.     chlorthalidone (HYGROTON) 50 MG tablet Take 25 mg by mouth daily.     Cholecalciferol 25 MCG (1000 UT) tablet Take by mouth.     Cyanocobalamin 1000 MCG CAPS Take 1 capsule by mouth daily.     cyclobenzaprine (FLEXERIL) 10 MG tablet Take 10 mg by mouth as needed.      Dulaglutide (TRULICITY [place]) Inject 1 Dose into the skin once a week.     EPINEPHrine 0.3 mg/0.3 mL IJ SOAJ injection Inject 0.3  mg into the muscle as needed.     famotidine (PEPCID) 20 MG tablet One at bedtime 30 tablet 2   fexofenadine (ALLEGRA) 180 MG tablet Take 180 mg by mouth daily.     finasteride (PROSCAR) 5 MG tablet Take 5 mg by mouth daily.     fluticasone (FLONASE) 50 MCG/ACT nasal spray Place into both nostrils daily.     furosemide (LASIX) 20 MG tablet Take by mouth.     gabapentin (NEURONTIN) 400 MG capsule Take 600 mg by mouth 2 (two) times daily. Increased to 600mg      hydrocortisone (CORTEF) 5 MG tablet Take 5 mg by mouth 2 (two) times daily.     ketoconazole (NIZORAL) 2 % cream Apply 1 application topically daily as needed for irritation (feet).      lidocaine (LIDODERM) 5 % Place 1 patch onto the skin as needed.     propranolol (INDERAL) 80 MG tablet Take 120 mg by mouth daily.     rivaroxaban (XARELTO) 20 MG TABS tablet Take 20 mg by mouth daily with supper.     rosuvastatin (CRESTOR) 20 MG tablet Take 1 tablet (20 mg total) by mouth daily. 90 tablet 3   spironolactone (ALDACTONE) 25 MG tablet Take 0.5  tablets (12.5 mg total) by mouth daily. 45 tablet 3   topiramate (TOPAMAX) 50 MG tablet TAKE ONE TABLET BY MOUTH TWO TIMES A DAY START AFTER COMPLETING 25 MG TABLETS     traMADol (ULTRAM) 50 MG tablet Take 50 mg by mouth 2 (two) times daily as needed.     No current facility-administered medications on file prior to visit.    Allergies  Allergen Reactions   Penicillins Shortness Of Breath   Amlodipine Swelling   Amlodipine Besylate Other (See Comments)   Atorvastatin Other (See Comments)    cramps   Erenumab-Aooe     unknown   Lisinopril Other (See Comments) and Hypertension    Headaches   Penicillin G Other (See Comments)   Simvastatin Other (See Comments)    cramps    There were no vitals taken for this visit.   Assessment/Plan:  1. Hypertension -  No problem-specific Assessment & Plan notes found for this encounter.      Thank you  Carmela Hurt, Pharm.D  [facility] [address] [place], [place] [address]  Phone: [phone]; Fax: [phone]

## 2023-07-26 DIAGNOSIS — E119 Type 2 diabetes mellitus without complications: Secondary | ICD-10-CM | POA: Diagnosis not present

## 2023-07-26 DIAGNOSIS — Z961 Presence of intraocular lens: Secondary | ICD-10-CM | POA: Diagnosis not present

## 2023-07-26 DIAGNOSIS — D352 Benign neoplasm of pituitary gland: Secondary | ICD-10-CM | POA: Diagnosis not present

## 2023-07-26 DIAGNOSIS — H26491 Other secondary cataract, right eye: Secondary | ICD-10-CM | POA: Diagnosis not present

## 2023-07-31 ENCOUNTER — Telehealth: Payer: Self-pay

## 2023-07-31 DIAGNOSIS — Z79899 Other long term (current) drug therapy: Secondary | ICD-10-CM

## 2023-07-31 NOTE — Telephone Encounter (Signed)
 Patient walked in for a copy of lab order to to get BMET done at the [place].  He was supposed to get BMET 2 weeks after starting spironolactone. He did have one on 2/17 its in chart can you please review.

## 2023-08-01 ENCOUNTER — Encounter: Payer: Self-pay | Admitting: Pain Medicine

## 2023-08-01 ENCOUNTER — Ambulatory Visit
Admission: RE | Admit: 2023-08-01 | Discharge: 2023-08-01 | Disposition: A | Discharge status: Home / Self Care | Source: Ambulatory Visit | Attending: Pain Medicine | Admitting: Pain Medicine

## 2023-08-01 ENCOUNTER — Ambulatory Visit: Attending: Pain Medicine | Admitting: Pain Medicine

## 2023-08-01 VITALS — BP 134/85 | HR 65 | Temp 97.2°F | Resp 12 | Ht 71.0 in | Wt 253.0 lb

## 2023-08-01 DIAGNOSIS — Z7901 Long term (current) use of anticoagulants: Secondary | ICD-10-CM | POA: Diagnosis not present

## 2023-08-01 DIAGNOSIS — M7061 Trochanteric bursitis, right hip: Secondary | ICD-10-CM | POA: Diagnosis not present

## 2023-08-01 DIAGNOSIS — M25551 Pain in right hip: Secondary | ICD-10-CM | POA: Diagnosis not present

## 2023-08-01 DIAGNOSIS — G8929 Other chronic pain: Secondary | ICD-10-CM | POA: Insufficient documentation

## 2023-08-01 DIAGNOSIS — M1611 Unilateral primary osteoarthritis, right hip: Secondary | ICD-10-CM

## 2023-08-01 MED ORDER — ROPIVACAINE HCL 2 MG/ML IJ SOLN
9.0000 mL | Freq: Once | INTRAMUSCULAR | Status: AC
Start: 2023-08-01 — End: 2023-08-01
  Administered 2023-08-01: 9 mL via INTRA_ARTICULAR
  Filled 2023-08-01: qty 20

## 2023-08-01 MED ORDER — IOHEXOL 180 MG/ML  SOLN
INTRAMUSCULAR | Status: AC
  Filled 2023-08-01: qty 20

## 2023-08-01 MED ORDER — LIDOCAINE HCL 2 % IJ SOLN
20.0000 mL | Freq: Once | INTRAMUSCULAR | Status: AC
  Administered 2023-08-01: 400 mg
  Filled 2023-08-01: qty 20

## 2023-08-01 MED ORDER — MIDAZOLAM HCL 2 MG/2ML IJ SOLN: 0.5000 mg | Freq: Once | INTRAMUSCULAR | Status: DC

## 2023-08-01 MED ORDER — IOPAMIDOL (ISOVUE-M 200) INJECTION 41%: 10.0000 mL | Freq: Once | INTRAMUSCULAR | Status: DC

## 2023-08-01 MED ORDER — IOHEXOL 180 MG/ML  SOLN
10.0000 mL | Freq: Once | INTRAMUSCULAR | Status: AC
  Administered 2023-08-01: 10 mL via INTRA_ARTICULAR

## 2023-08-01 MED ORDER — PENTAFLUOROPROP-TETRAFLUOROETH EX AERO
INHALATION_SPRAY | Freq: Once | CUTANEOUS | Status: AC
Start: 2023-08-01 — End: 2023-08-01
  Administered 2023-08-01: 30 via TOPICAL

## 2023-08-01 MED ORDER — METHYLPREDNISOLONE ACETATE 80 MG/ML IJ SUSP
80.0000 mg | Freq: Once | INTRAMUSCULAR | Status: AC
Start: 2023-08-01 — End: 2023-08-01
  Administered 2023-08-01: 80 mg via INTRA_ARTICULAR
  Filled 2023-08-01: qty 1

## 2023-08-01 NOTE — Patient Instructions (Signed)
 ______________________________________________________________________    Blood Thinners  IMPORTANT NOTICE:  If you take any of these, make sure to notify the nursing staff.  Failure to do so may result in serious injury.  Recommended time intervals to stop and restart blood-thinners, before & after invasive procedures  Generic Name Brand Name Pre-procedure: Stop medication for this amount of time before your procedure: Post-procedure: Wait this amount of time after the procedure before restarting your medication:  Abciximab Reopro 15 days 2 hrs  Alteplase Activase 10 days 10 days  Anagrelide Agrylin    Apixaban Eliquis 3 days 6 hrs  Cilostazol Pletal 3 days 5 hrs  Clopidogrel Plavix 7-10 days 2 hrs  Dabigatran Pradaxa 5 days 6 hrs  Dalteparin Fragmin 24 hours 4 hrs  Dipyridamole Aggrenox 11days 2 hrs  Edoxaban Lixiana; Savaysa 3 days 2 hrs  Enoxaparin  Lovenox 24 hours 4 hrs  Eptifibatide Integrillin 8 hours 2 hrs  Fondaparinux  Arixtra 72 hours 12 hrs  Hydroxychloroquine Plaquenil 11 days   Prasugrel Effient 7-10 days 6 hrs  Reteplase Retavase 10 days 10 days  Rivaroxaban Xarelto 3 days 6 hrs  Ticagrelor Brilinta 5-7 days 6 hrs  Ticlopidine Ticlid 10-14 days 2 hrs  Tinzaparin Innohep 24 hours 4 hrs  Tirofiban Aggrastat 8 hours 2 hrs  Warfarin Coumadin 5 days 2 hrs   Other medications with blood-thinning effects  NOTE: Consider stopping these if you have prolonged bleeding despite not taking any of the above blood thinners. Otherwise ask your provider and this will be decided on a case-by-case basis.  Product indications Generic (Brand) names Note  Cholesterol Lipitor Stop 4 days before procedure  Blood thinner (injectable) Heparin (LMW or LMWH Heparin) Stop 24 hours before procedure  Cancer Ibrutinib (Imbruvica) Stop 7 days before procedure  Malaria/Rheumatoid Hydroxychloroquine (Plaquenil) Stop 11 days before procedure  Thrombolytics  10 days before or after procedures    Over-the-counter (OTC) Products with blood-thinning effects  NOTE: Consider stopping these if you have prolonged bleeding despite not taking any of the above blood thinners. Otherwise ask your provider and this will be decided on a case-by-case basis.  Product Common names Stop Time  Aspirin > 325 mg Goody Powders, Excedrin, etc. 11 days  Aspirin <= 81 mg  7 days  Fish oil  4 days  Garlic supplements  7 days  Ginkgo biloba  36 hours  Ginseng  24 hours  NSAIDs Ibuprofen, Naprosyn, etc. 3 days  Vitamin E  4 days   ______________________________________________________________________      ______________________________________________________________________    Post-Procedure Discharge Instructions  Instructions: Apply ice:  Purpose: This will minimize any swelling and discomfort after procedure.  When: Day of procedure, as soon as you get home. How: Fill a plastic sandwich bag with crushed ice. Cover it with a small towel and apply to injection site. How long: (15 min on, 15 min off) Apply for 15 minutes then remove x 15 minutes.  Repeat sequence on day of procedure, until you go to bed. Apply heat:  Purpose: To treat any soreness and discomfort from the procedure. When: Starting the next day after the procedure. How: Apply heat to procedure site starting the day following the procedure. How long: May continue to repeat daily, until discomfort goes away. Food intake: Start with clear liquids (like water) and advance to regular food, as tolerated.  Physical activities: Keep activities to a minimum for the first 8 hours after the procedure. After that, then as tolerated. Driving: If you have  received any sedation, be responsible and do not drive. You are not allowed to drive for 24 hours after having sedation. Blood thinner: (Applies only to those taking blood thinners) You may restart your blood thinner 6 hours after your procedure. Insulin: (Applies only to Diabetic patients  taking insulin) As soon as you can eat, you may resume your normal dosing schedule. Infection prevention: Keep procedure site clean and dry. Shower daily and clean area with soap and water. Post-procedure Pain Diary: Extremely important that this be done correctly and accurately. Recorded information will be used to determine the next step in treatment. For the purpose of accuracy, follow these rules: Evaluate only the area treated. Do not report or include pain from an untreated area. For the purpose of this evaluation, ignore all other areas of pain, except for the treated area. After your procedure, avoid taking a long nap and attempting to complete the pain diary after you wake up. Instead, set your alarm clock to go off every hour, on the hour, for the initial 8 hours after the procedure. Document the duration of the numbing medicine, and the relief you are getting from it. Do not go to sleep and attempt to complete it later. It will not be accurate. If you received sedation, it is likely that you were given a medication that may cause amnesia. Because of this, completing the diary at a later time may cause the information to be inaccurate. This information is needed to plan your care. Follow-up appointment: Keep your post-procedure follow-up evaluation appointment after the procedure (usually 2 weeks for most procedures, 6 weeks for radiofrequencies). DO NOT FORGET to bring you pain diary with you.   Expect: (What should I expect to see with my procedure?) From numbing medicine (AKA: Local Anesthetics): Numbness or decrease in pain. You may also experience some weakness, which if present, could last for the duration of the local anesthetic. Onset: Full effect within 15 minutes of injected. Duration: It will depend on the type of local anesthetic used. On the average, 1 to 8 hours.  From steroids (Applies only if steroids were used): Decrease in swelling or inflammation. Once inflammation is  improved, relief of the pain will follow. Onset of benefits: Depends on the amount of swelling present. The more swelling, the longer it will take for the benefits to be seen. In some cases, up to 10 days. Duration: Steroids will stay in the system x 2 weeks. Duration of benefits will depend on multiple posibilities including persistent irritating factors. Side-effects: If present, they may typically last 2 weeks (the duration of the steroids). Frequent: Cramps (if they occur, drink Gatorade and take over-the-counter Magnesium 450-500 mg once to twice a day); water retention with temporary weight gain; increases in blood sugar; decreased immune system response; increased appetite. Occasional: Facial flushing (red, warm cheeks); mood swings; menstrual changes. Uncommon: Long-term decrease or suppression of natural hormones; bone thinning. (These are more common with higher doses or more frequent use. This is why we prefer that our patients avoid having any injection therapies in other practices.)  Very Rare: Severe mood changes; psychosis; aseptic necrosis. From procedure: Some discomfort is to be expected once the numbing medicine wears off. This should be minimal if ice and heat are applied as instructed.  Call if: (When should I call?) You experience numbness and weakness that gets worse with time, as opposed to wearing off. New onset bowel or bladder incontinence. (Applies only to procedures done in the spine)  Emergency Numbers: Durning business hours (Monday - Thursday, 8:00 AM - 4:00 PM) (Friday, 9:00 AM - 12:00 Noon): [phone] After hours: [phone] NOTE: If you are having a problem and are unable connect with, or to talk to a provider, then go to your nearest urgent care or emergency department. If the problem is serious and urgent, please call 911. ______________________________________________________________________

## 2023-08-01 NOTE — Telephone Encounter (Signed)
 Pt had BMP on 07/10/23 - please review under lab tab.

## 2023-08-01 NOTE — Progress Notes (Signed)
 PROVIDER NOTE: Interpretation of information contained herein should be left to medically-trained personnel. Specific patient instructions are provided elsewhere under "Patient Instructions" section of medical record. This document was created in part using STT-dictation technology, any transcriptional errors that may result from this process are unintentional.  Patient: [name] Whitaker Type: Established DOB: [birth year] MRN: [id number] [facility]CP: [facility]  Service: Procedure DOS: 08/01/2023 Setting: Ambulatory Location: Ambulatory outpatient facility Delivery: Face-to-face Provider: Oswaldo Done, MD Specialty: Interventional Pain Management Specialty designation: 09 Location: Outpatient facility Ref. Prov.: Yvette Rack, MD       Interventional Therapy   Type: Hip & bursae injection #4  Laterality: Right (-RT)  Bursae: Trochanteric #2 Laterality: Right (-RT)  Approach: Percutaneous posterolateral approach. Level: Lower pelvic and hip joint level.  Imaging: Fluoroscopy-guided Non-spinal ([id number]) Anesthesia: Local anesthesia (1-2% Lidocaine) Anxiolysis: IV                 Sedation: No Sedation                       DOS: 08/01/2023  Performed by: Oswaldo Done, MD  Purpose: Diagnostic/Therapeutic Indications: Hip pain severe enough to impact quality of life or function. Rationale (medical necessity): procedure needed and proper for the diagnosis and/or treatment of [name] Whitaker's medical symptoms and needs. 1. Chronic hip pain (Right)   2. Arthritis of hip (Right)   3. Greater trochanteric bursitis of hip (Right)   4. Osteoarthritis of hip (Right)   5. Chronic anticoagulation (Xarelto)    NAS-11 Pain score:   Pre-procedure: 7 /10   Post-procedure: 7 /10      Target: Intra-articular aspect of the hip joint & peri-articular bursae Region: Hip joint proper. Femoral region Procedure Type: Percutaneous injection   Position / Prep / Materials:   Position: Lateral Decubitus with affected side up  Prep solution: ChloraPrep (2% chlorhexidine gluconate and 70% isopropyl alcohol) Prep Area:  Entire Posterolateral hip area. Materials:  Tray: Block tray Needle(s):  Type: Spinal  Gauge (G): 22  Length: 7-in  Qty: 1  H&P (Pre-op Assessment):  [name] Whitaker is a 73 y.o. (year old), male patient, seen today for interventional treatment. He  has a past surgical history that includes Appendectomy; Nasal septum surgery; Tonsillectomy; Testicle surgery; Cardiac catheterization; Back surgery; Cataract extraction w/PHACO (Right, 10/20/2020); and Cataract extraction w/PHACO (Left, 11/03/2020). [name] Whitaker has a current medication list which includes the following prescription(s): acetaminophen, allopurinol, ascorbic acid, candesartan, chlorthalidone, cholecalciferol, cyanocobalamin, cyclobenzaprine, dulaglutide, epinephrine, famotidine, fexofenadine, finasteride, fluticasone, furosemide, gabapentin, hydrocortisone, ketoconazole, lidocaine, propranolol, rivaroxaban, rosuvastatin, spironolactone, topiramate, and tramadol, and the following Facility-Administered Medications: iohexol, lidocaine, methylprednisolone acetate, midazolam, pentafluoroprop-tetrafluoroeth, and ropivacaine (pf) 2 mg/ml (0.2%). His primarily concern today is the Hip Pain (Right )  Initial Vital Signs:  Pulse/HCG Rate: 65ECG Heart Rate: 60 Temp: (!) 97.2 F (36.2 C) Resp: 16 BP: 121/87 SpO2: 99 %  BMI: Estimated body mass index is 35.29 kg/m as calculated from the following:   Height as of this encounter: 5\' 11"  (1.803 m).   Weight as of this encounter: 253 lb (114.8 kg).  Risk Assessment: Allergies: Reviewed. He is allergic to penicillins, amlodipine, amlodipine besylate, atorvastatin, erenumab-aooe, lisinopril, penicillin g, and simvastatin.  Allergy Precautions: None required Coagulopathies: Reviewed. None identified.  Blood-thinner therapy: None at this time Active  Infection(s): Reviewed. None identified. [name] Whitaker is afebrile  Site Confirmation: [name] Whitaker was asked to confirm the procedure and laterality before marking the site Procedure checklist:  Completed Consent: Before the procedure and under the influence of no sedative(s), amnesic(s), or anxiolytics, the patient was informed of the treatment options, risks and possible complications. To fulfill our ethical and legal obligations, as recommended by the American Medical Association's Code of Ethics, I have informed the patient of my clinical impression; the nature and purpose of the treatment or procedure; the risks, benefits, and possible complications of the intervention; the alternatives, including doing nothing; the risk(s) and benefit(s) of the alternative treatment(s) or procedure(s); and the risk(s) and benefit(s) of doing nothing. The patient was provided information about the general risks and possible complications associated with the procedure. These may include, but are not limited to: failure to achieve desired goals, infection, bleeding, organ or nerve damage, allergic reactions, paralysis, and death. In addition, the patient was informed of those risks and complications associated to the procedure, such as failure to decrease pain; infection; bleeding; organ or nerve damage with subsequent damage to sensory, motor, and/or autonomic systems, resulting in permanent pain, numbness, and/or weakness of one or several areas of the body; allergic reactions; (i.e.: anaphylactic reaction); and/or death. Furthermore, the patient was informed of those risks and complications associated with the medications. These include, but are not limited to: allergic reactions (i.e.: anaphylactic or anaphylactoid reaction(s)); adrenal axis suppression; blood sugar elevation that in diabetics may result in ketoacidosis or comma; water retention that in patients with history of congestive heart failure may result in  shortness of breath, pulmonary edema, and decompensation with resultant heart failure; weight gain; swelling or edema; medication-induced neural toxicity; particulate matter embolism and blood vessel occlusion with resultant organ, and/or nervous system infarction; and/or aseptic necrosis of one or more joints. Finally, the patient was informed that Medicine is not an exact science; therefore, there is also the possibility of unforeseen or unpredictable risks and/or possible complications that may result in a catastrophic outcome. The patient indicated having understood very clearly. We have given the patient no guarantees and we have made no promises. Enough time was given to the patient to ask questions, all of which were answered to the patient's satisfaction. Mr. Heinlen has indicated that he wanted to continue with the procedure. Attestation: I, the ordering provider, attest that I have discussed with the patient the benefits, risks, side-effects, alternatives, likelihood of achieving goals, and potential problems during recovery for the procedure that I have provided informed consent. Date  Time: 08/01/2023 10:24 AM  Pre-Procedure Preparation:  Monitoring: As per clinic protocol. Respiration, ETCO2, SpO2, BP, heart rate and rhythm monitor placed and checked for adequate function Safety Precautions: Patient was assessed for positional comfort and pressure points before starting the procedure. Time-out: I initiated and conducted the "Time-out" before starting the procedure, as per protocol. The patient was asked to participate by confirming the accuracy of the "Time Out" information. Verification of the correct person, site, and procedure were performed and confirmed by me, the nursing staff, and the patient. "Time-out" conducted as per Joint Commission's Universal Protocol (UP.01.01.01). Time:   Start Time:   hrs.  Narrative                Rationale (medical necessity): procedure needed and proper  for the diagnosis and/or treatment of the patient's medical symptoms and needs. Procedural Technique Safety Precautions: Aspiration looking for blood return was conducted prior to all injections. At no point did we inject any substances, as a needle was being advanced. No attempts were made at seeking any paresthesias. Safe injection practices and  needle disposal techniques used. Medications properly checked for expiration dates. SDV (single dose vial) medications used. Description of the Procedure: Protocol guidelines were followed. The patient was assisted into a comfortable position. The target area was identified and the area prepped in the usual manner. Skin & deeper tissues infiltrated with local anesthetic. Appropriate amount of time allowed to pass for local anesthetics to take effect. The procedure needles were then advanced to the target area. Proper needle placement secured. Negative aspiration confirmed. Solution injected in intermittent fashion, asking for systemic symptoms every 0.5cc of injectate. The needles were then removed and the area cleansed, making sure to leave some of the prepping solution back to take advantage of its long term bactericidal properties.  Technical description of procedure:  Skin & deeper tissues infiltrated with local anesthetic. Appropriate amount of time allowed to pass for local anesthetics to take effect. The procedure needles were then advanced to the target area. Proper needle placement secured. Negative aspiration confirmed. Solution injected in intermittent fashion, asking for systemic symptoms every 0.5cc of injectate. The needles were then removed and the area cleansed, making sure to leave some of the prepping solution back to take advantage of its long term bactericidal properties.             Vitals:   08/01/23 1022 08/01/23 1115  BP: 121/87 129/88  Pulse: 65   Resp: 16 18  Temp: (!) 97.2 F (36.2 C)   TempSrc: Temporal   SpO2: 99% 96%   Weight: 253 lb (114.8 kg)   Height: 5\' 11"  (1.803 m)      Start Time:   hrs. End Time:   hrs.  Imaging Guidance (Non-Spinal):          Type of Imaging Technique: Fluoroscopy Guidance (Non-Spinal) Indication(s): Fluoroscopy guidance for needle placement to enhance accuracy in procedures requiring precise needle localization for targeted delivery of medication in or near specific anatomical locations not easily accessible without such real-time imaging assistance. Exposure Time: Please see nurses notes. Contrast: Before injecting any contrast, we confirmed that the patient did not have an allergy to iodine, shellfish, or radiological contrast. Once satisfactory needle placement was completed at the desired level, radiological contrast was injected. Contrast injected under live fluoroscopy. No contrast complications. See chart for type and volume of contrast used. Fluoroscopic Guidance: I was personally present during the use of fluoroscopy. "Tunnel Vision Technique" used to obtain the best possible view of the target area. Parallax error corrected before commencing the procedure. "Direction-depth-direction" technique used to introduce the needle under continuous pulsed fluoroscopy. Once target was reached, antero-posterior, oblique, and lateral fluoroscopic projection used confirm needle placement in all planes. Images permanently stored in EMR. Interpretation: I personally interpreted the imaging intraoperatively. Adequate needle placement confirmed in multiple planes. Appropriate spread of contrast into desired area was observed. No evidence of afferent or efferent intravascular uptake. Permanent images saved into the patient's record.  Post-operative Assessment:  Post-procedure Vital Signs:  Pulse/HCG Rate: 6560 Temp: (!) 97.2 F (36.2 C) Resp: 18 BP: 129/88 SpO2: 96 %  EBL: None  Complications: No immediate post-treatment complications observed by team, or reported by patient.  Note:  The patient tolerated the entire procedure well. A repeat set of vitals were taken after the procedure and the patient was kept under observation following institutional policy, for this type of procedure. Post-procedural neurological assessment was performed, showing return to baseline, prior to discharge. The patient was provided with post-procedure discharge instructions, including a section on how  to identify potential problems. Should any problems arise concerning this procedure, the patient was given instructions to immediately contact us, at any time, without hesitation. In any case, we plan to contact the patient by telephone for a follow-up status report regarding this interventional procedure.  Comments:  No additional relevant information.  Plan of Care (POC)  Orders:  Orders Placed This Encounter  Procedures   HIP INJECTION    Scheduling Instructions:     Side: Right-sided     Sedation: Patient's choice.     Timeframe: Today   DG PA[facility] C-ARM 1-60 MIN NO REPORT    Intraoperative interpretation by procedural physician at [facility].    Standing Status:   Standing    Number of Occurrences:   1    Reason for exam::   Assistance in needle guidance and placement for procedures requiring needle placement in or near specific anatomical locations not easily accessible without such assistance.   Informed Consent Details: Physician/Practitioner Attestation; Transcribe to consent form and obtain patient signature    Nursing Order: Transcribe to consent form and obtain patient signature. Note: Always confirm laterality of pain with Mr. Elko, before procedure.    Physician/Practitioner attestation of informed consent for procedure/surgical case:   I, the physician/practitioner, attest that I have discussed with the patient the benefits, risks, side effects, alternatives, likelihood of achieving goals and potential problems during recovery for the procedure that I have  provided informed consent.    Procedure:   Hip injection    Physician/Practitioner performing the procedure:   Alyssha Housh A. Laban Emperor, MD    Indication/Reason:   Hip Joint Pain (Arthralgia)   Care order/instruction: Please confirm that the patient has stopped the Xarelto (Rivaroxaban) x 3 days prior to procedure or surgery.    Please confirm that the patient has stopped the Xarelto (Rivaroxaban) x 3 days prior to procedure or surgery.    Standing Status:   Standing    Number of Occurrences:   1   Provide equipment / supplies at bedside    Procedure tray: "Block Tray" (Disposable  single use) Skin infiltration needle: Regular 1.5-in, 25-G, (x1) Block Needle type: Spinal Amount/quantity: 1 Size: Long (7-inch) Gauge: 22G    Standing Status:   Standing    Number of Occurrences:   1    Specify:   Block Tray   Saline lock IV    Have LR [phone] mL available and administer at 125 mL/hr if patient becomes hypotensive.    Standing Status:   Standing    Number of Occurrences:   1   Bleeding precautions    Standing Status:   Standing    Number of Occurrences:   1   Chronic Opioid Analgesic:  No opioid analgesics prescribed by our practice. Highest recorded MME/day: 26.67 mg/day MME/day: 0 mg/day    Medications ordered for procedure: Meds ordered this encounter  Medications   DISCONTD: iopamidol (ISOVUE-M) 41 % intrathecal injection 10 mL    Must be Myelogram-compatible. If not available, you may substitute with a water-soluble, non-ionic, hypoallergenic, myelogram-compatible radiological contrast medium.   lidocaine (XYLOCAINE) 2 % (with pres) injection 400 mg   pentafluoroprop-tetrafluoroeth (GEBAUERS) aerosol   midazolam (VERSED) injection 0.5-2 mg    Make sure Flumazenil is available in the pyxis when using this medication. If oversedation occurs, administer 0.2 mg IV over 15 sec. If after 45 sec no response, administer 0.2 mg again over 1 min; may repeat at 1 min intervals; not to  exceed 4 doses (1 mg)   ropivacaine (PF) 2 mg/mL (0.2%) (NAROPIN) injection 9 mL   methylPREDNISolone acetate (DEPO-MEDROL) injection 80 mg   iohexol (OMNIPAQUE) 180 MG/ML injection 10 mL    Must be Myelogram-compatible. If not available, you may substitute with a water-soluble, non-ionic, hypoallergenic, myelogram-compatible radiological contrast medium.   Medications administered: Clifton James had no medications administered during this visit.  See the medical record for exact dosing, route, and time of administration.  Follow-up plan:   Return in about 2 weeks (around 08/15/2023) for (Face2F), (PPE).       Interventional Therapies  Risk factors  Considerations:   NOTE: Xarelto Anticoagulation (Stop: 3 days  Restart: 6 hours)  HTN  CKD     Planned  Pending:   Therapeutic right IA hip #4 + right trochanteric bursa injection #2   Under consideration:   Therapeutic left lumbar facet RFA #3 Possible right opturator + femoral NB  Possible right opturator + femoral nerve RFA    Completed:   Therapeutic right IA hip joint inj. x3 (04/27/2023) (100/100/100/90) Therapeutic right trochanteric bursa inj. x1 (04/27/2023) (100/100/100/90) Diagnostic bilateral SI joint Blk x2 (03/10/2020) (50/50/100 x 5 wks/0)  Diagnostic/therapeutic right SI joint Blk x3 (12/15/2022) (100/100/0/0)  Diagnostic bilateral lumbar facet MBB x1 (05/07/2020) (100/100/100 x1 week/>75)  Therapeutic left lumbar facet RFA x3 (06/16/2022) (100/100/100/100)  Therapeutic right lumbar facet RFA x3 (05/24/2022) (100/100/80/80) (by 06/16/2022 it had progressed to 100% ongoing relief)   Palliative options:   Palliative right IA hip joint inj.  Palliative bilateral SI joint Blk  Therapeutic/palliative lumbar facet RFA      Recent Visits Date Type Provider Dept  07/05/23 Office Visit Delano Metz, MD [facility]  05/11/23 Office Visit Delano Metz, MD [facility]  Showing recent  visits within past 90 days and meeting all other requirements Today's Visits Date Type Provider Dept  08/01/23 Procedure visit Delano Metz, MD [facility]  Showing today's visits and meeting all other requirements Future Appointments No visits were found meeting these conditions. Showing future appointments within next 90 days and meeting all other requirements  Disposition: Discharge home  Discharge (Date  Time): 08/01/2023;   hrs.   Primary Care Physician: [facility] Location: [facility] Note by: Oswaldo Done, MD (TTS technology used. I apologize for any typographical errors that were not detected and corrected.) Date: 08/01/2023; Time: 11:20 AM  Disclaimer:  Medicine is not an Visual merchandiser. The only guarantee in medicine is that nothing is guaranteed. It is important to note that the decision to proceed with this intervention was based on the information collected from the patient. The Data and conclusions were drawn from the patient's questionnaire, the interview, and the physical examination. Because the information was provided in large part by the patient, it cannot be guaranteed that it has not been purposely or unconsciously manipulated. Every effort has been made to obtain as much relevant data as possible for this evaluation. It is important to note that the conclusions that lead to this procedure are derived in large part from the available data. Always take into account that the treatment will also be dependent on availability of resources and existing treatment guidelines, considered by other Pain Management Practitioners as being common knowledge and practice, at the time of the intervention. For Medico-Legal purposes, it is also important to point out that variation in procedural techniques and pharmacological choices are the acceptable norm. The indications, contraindications,  technique, and results of the above  procedure should only be interpreted and judged by a Board-Certified Interventional Pain Specialist with extensive familiarity and expertise in the same exact procedure and technique.

## 2023-08-01 NOTE — Progress Notes (Signed)
 Safety precautions to be maintained throughout the outpatient stay will include: orient to surroundings, keep bed in low position, maintain call bell within reach at all times, provide assistance with transfer out of bed and ambulation.

## 2023-08-02 ENCOUNTER — Telehealth: Payer: Self-pay

## 2023-08-02 NOTE — Telephone Encounter (Signed)
 No issues post-procedure.

## 2023-08-02 NOTE — Telephone Encounter (Signed)
 Post procedure follow up.  LM

## 2023-08-02 NOTE — Telephone Encounter (Signed)
 Left voicemail to return call to office

## 2023-08-03 ENCOUNTER — Ambulatory Visit
Admission: RE | Admit: 2023-08-03 | Discharge: 2023-08-03 | Disposition: A | Discharge status: Home / Self Care | Source: Ambulatory Visit | Attending: Pain Medicine | Admitting: Pain Medicine

## 2023-08-03 DIAGNOSIS — M25551 Pain in right hip: Secondary | ICD-10-CM | POA: Diagnosis present

## 2023-08-03 DIAGNOSIS — M7061 Trochanteric bursitis, right hip: Secondary | ICD-10-CM | POA: Insufficient documentation

## 2023-08-03 DIAGNOSIS — M1611 Unilateral primary osteoarthritis, right hip: Secondary | ICD-10-CM | POA: Diagnosis present

## 2023-08-03 DIAGNOSIS — G8929 Other chronic pain: Secondary | ICD-10-CM | POA: Insufficient documentation

## 2023-08-04 ENCOUNTER — Other Ambulatory Visit: Payer: Self-pay

## 2023-08-04 ENCOUNTER — Emergency Department (HOSPITAL_BASED_OUTPATIENT_CLINIC_OR_DEPARTMENT_OTHER)
Admission: EM | Admit: 2023-08-04 | Discharge: 2023-08-04 | Disposition: A | Discharge status: Home / Self Care | Attending: Emergency Medicine | Admitting: Emergency Medicine

## 2023-08-04 ENCOUNTER — Ambulatory Visit (HOSPITAL_COMMUNITY): Payer: Medicare Other | Attending: Cardiology

## 2023-08-04 ENCOUNTER — Encounter: Payer: Self-pay | Admitting: Cardiology

## 2023-08-04 ENCOUNTER — Encounter (HOSPITAL_COMMUNITY): Payer: Self-pay | Admitting: Cardiology

## 2023-08-04 ENCOUNTER — Ambulatory Visit (INDEPENDENT_AMBULATORY_CARE_PROVIDER_SITE_OTHER): Admitting: Cardiology

## 2023-08-04 ENCOUNTER — Encounter (HOSPITAL_BASED_OUTPATIENT_CLINIC_OR_DEPARTMENT_OTHER): Payer: Self-pay | Admitting: Emergency Medicine

## 2023-08-04 ENCOUNTER — Emergency Department (HOSPITAL_BASED_OUTPATIENT_CLINIC_OR_DEPARTMENT_OTHER)

## 2023-08-04 VITALS — BP 178/110

## 2023-08-04 DIAGNOSIS — I77819 Aortic ectasia, unspecified site: Secondary | ICD-10-CM | POA: Insufficient documentation

## 2023-08-04 DIAGNOSIS — Z9189 Other specified personal risk factors, not elsewhere classified: Secondary | ICD-10-CM | POA: Insufficient documentation

## 2023-08-04 DIAGNOSIS — I77810 Thoracic aortic ectasia: Secondary | ICD-10-CM | POA: Diagnosis present

## 2023-08-04 DIAGNOSIS — Z79899 Other long term (current) drug therapy: Secondary | ICD-10-CM | POA: Diagnosis not present

## 2023-08-04 DIAGNOSIS — I10 Essential (primary) hypertension: Secondary | ICD-10-CM | POA: Diagnosis not present

## 2023-08-04 LAB — CBC
HCT: 43.9 % (ref 39.0–52.0)
Hemoglobin: 14.6 g/dL (ref 13.0–17.0)
MCH: 29 pg (ref 26.0–34.0)
MCHC: 33.3 g/dL (ref 30.0–36.0)
MCV: 87.1 fL (ref 80.0–100.0)
Platelets: 225 10*3/uL (ref 150–400)
RBC: 5.04 MIL/uL (ref 4.22–5.81)
RDW: 14.4 % (ref 11.5–15.5)
WBC: 7.5 10*3/uL (ref 4.0–10.5)
nRBC: 0 % (ref 0.0–0.2)

## 2023-08-04 LAB — ECHOCARDIOGRAM COMPLETE
Area-P 1/2: 3.28 cm2
P 1/2 time: 554 ms
S' Lateral: 3.7 cm

## 2023-08-04 LAB — BASIC METABOLIC PANEL
Anion gap: 8 (ref 5–15)
BUN: 9 mg/dL (ref 8–23)
CO2: 30 mmol/L (ref 22–32)
Calcium: 8.7 mg/dL — ABNORMAL LOW (ref 8.9–10.3)
Chloride: 105 mmol/L (ref 98–111)
Creatinine, Ser: 1.33 mg/dL — ABNORMAL HIGH (ref 0.61–1.24)
GFR, Estimated: 58 mL/min — ABNORMAL LOW (ref >60)
Glucose, Bld: 116 mg/dL — ABNORMAL HIGH (ref 70–99)
Potassium: 3 mmol/L — ABNORMAL LOW (ref 3.5–5.1)
Sodium: 143 mmol/L (ref 135–145)

## 2023-08-04 LAB — D-DIMER, QUANTITATIVE: D-Dimer, Quant: 0.37 ug{FEU}/mL (ref 0.00–0.50)

## 2023-08-04 MED ORDER — IOHEXOL 350 MG/ML SOLN
100.0000 mL | Freq: Once | INTRAVENOUS | Status: AC | PRN
  Administered 2023-08-04: 100 mL via INTRAVENOUS

## 2023-08-04 NOTE — Progress Notes (Signed)
 Cardiology Office Note:  .   Date:  08/04/2023  ID:  [name] Whitaker, DOB 1950/12/29, MRN [id number] [facility]CP: [facility]  [facility] Providers Cardiologist:  Truett Mainland, MD PCP: [facility]  Chief Complaint  Patient presents with   Cardiomyopathy   Ascending arota aneurysm      History of Present Illness: [name] Whitaker    [name] Whitaker is a 73 y.o. male with hypertension, hyperlipidemia, coronary artery disease, h/o recurrent DVT, PE, ascending aorta aneurysm  Patient was here today for surveillance echocardiogram for his ascending aorta aneurysm.  I was alerted by sonography staff that his echocardiogram from today was read by Dr. 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  Patient is sitting comfortably in the chair, denies any complaints of chest pain, shortness of breath, presyncope or syncope.  See below regarding physical exam and my personal and independent review of echocardiogram from today and 2023.  Vitals:   08/04/23 0928  BP: (!) 178/110     ROS:  Review of Systems  Cardiovascular:  Negative for chest pain, dyspnea on exertion, leg swelling, palpitations and syncope.     Studies Reviewed: [name] Whitaker        Independently interpreted 05/2023: Hb 15.2 Cr 1.41, eGFR 54  05/2021: Chol 116, TG 93, HDL 29, LDL 69     Physical Exam:   Physical Exam Vitals and nursing note reviewed.  Constitutional:      General: He is not in acute distress. Neck:     Vascular: No JVD.  Cardiovascular:     Rate and Rhythm: Normal rate and regular rhythm.     Pulses:          Radial pulses are 2+ on the right side and 2+ on the left side.     Heart sounds: Murmur heard.     High-pitched blowing decrescendo early diastolic murmur is present with a grade of 1/4 at the upper right sternal border radiating to the apex.  Pulmonary:     Effort: Pulmonary effort is normal.     Breath sounds: Normal breath sounds. No  wheezing or rales.  Musculoskeletal:     Right lower leg: No edema.     Left lower leg: No edema.      VISIT DIAGNOSES:   ICD-10-CM   1. Primary hypertension  I10 CT CORONARY MORPH W/CTA COR W/SCORE W/CA W/CM &/OR WO/CM    2. At risk for aneurysm of ascending aorta  Z91.89        ASSESSMENT AND PLAN: .    [name] Whitaker is a 73 y.o. male with  hypertension, hyperlipidemia, coronary artery disease, h/o recurrent DVT, PE, ascending aorta aneurysm  Ascending aorta aneurysm: Known ascending aorta aneurysm with no significant change in diameter between 2023 and now.  There is trivial aortic regurgitation, unchanged from before.  There is a flap-like appearance in the ascending aorta noted today.  I compared this with previous echocardiogram in 2023.  While ascending aorta is better visualized on echocardiogram today, I am convinced that there was a flap-like appearance noted even on the previous echocardiogram in 2023.  Obviously, dissection cannot be ruled out.  However, patient is having no chest pain at this time.  Bilateral pulses are equal.  Blood pressure is elevated, but he is also anxious after our discussion.  My clinical suspicion for acute dissection is very low.  That said, we do need to rule out a chronic and  contained dissection.    The quickest and safest way to do this would be urgent gated CTA at ER, along with stat d-Dimer. We will expedite this process.  We have discussed this with the patient, and his daughter [name] Whitaker at length.  Both verbalized understanding.    F/u in 3 months w/Dr. Anne Fu  Signed, Elder Negus, MD

## 2023-08-04 NOTE — Patient Instructions (Addendum)
 Medication Instructions:  Your physician recommends that you continue on your current medications as directed. Please refer to the Current Medication list given to you today.  *If you need a refill on your cardiac medications before your next appointment, please call your pharmacy*  Testing/Procedures: Chest CTA  - please go to [facility] to have this done   Follow-Up: At [facility], you and your health needs are our priority.  As part of our continuing mission to provide you with exceptional heart care, we have created designated Provider Care Teams.  These Care Teams include your primary Cardiologist (physician) and Advanced Practice Providers (APPs -  Physician Assistants and Nurse Practitioners) who all work together to provide you with the care you need, when you need it.    You saw Dr. Carmie Kanner today

## 2023-08-04 NOTE — ED Triage Notes (Signed)
 Sent from cardiology for abnormal ECHO this morning. Sent for UC to r/o dissection.  Denies CP or any symptoms .

## 2023-08-04 NOTE — ED Notes (Signed)
 Pt c/o no pain or distress at this time. Was just sent here for abnormal ECHO.

## 2023-08-04 NOTE — Discharge Instructions (Addendum)
 Your CT scan did not show a flap and the diameter was 4.3 cm

## 2023-08-04 NOTE — Progress Notes (Signed)
 Doctor of the day, Dr. Rosemary Holms, and Dr. Eden Emms, echo reader, notified of possible abnormal echo results. Dr. Eden Emms, reviewed echo images and suggested patient be seen by DOD and have further testing. Patient was seen by Dr. Rosemary Holms, who will continue his care.

## 2023-08-04 NOTE — ED Notes (Signed)
 [place] in triage, speaking with patient about plan of care.

## 2023-08-04 NOTE — ED Provider Notes (Signed)
 [name] Whitaker Provider Note   CSN: [id number] Arrival date & time: 08/04/23  1010     History  Chief Complaint  Patient presents with   Abnormal Labs    [name] Whitaker is a 73 y.o. male.  HPI Patient with known dilated aortic root.  Sent from cardiology for potential dissection.  Had echocardiogram today that showed potentially ascending flap.  However per cardiology note apparently may have been present on previous echo.  No chest pain.  No lightheadedness or dizziness.  Patient appears to be on Xarelto for previous pulmonary embolism.   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   DVT, lower extremity (HCC) 10/27/2011   Dyspnea 06/27/2014   2/5 /2016  Walked RA x 3 laps @ 185 ft each stopped due to  End of study, slow pace min sob  - PFTs 08/01/14  FEV1  2.89 (90%) ratio 81 and nl dlco     GERD (gastroesophageal reflux disease)    Gout    History of DVT (deep vein thrombosis) 04/14/2019   History of pulmonary embolism 04/14/2019   HTN (hypertension) 10/25/2011   Hypercholesteremia    Migraine    1-2x/wk   OSA (obstructive sleep apnea)    on CPAP    Pre-diabetes     Home Medications Prior to Admission medications   Medication Sig Start Date End Date Taking? Authorizing Provider  acetaminophen (TYLENOL) 325 MG tablet Take 650 mg by mouth as needed.    [provider]  allopurinol (ZYLOPRIM) 300 MG tablet Take 300 mg by mouth daily.    [provider]  ascorbic acid (VITAMIN C) 500 MG tablet Take 1 tablet by mouth daily. 01/16/20   [provider]  candesartan (ATACAND) 32 MG tablet Take 32 mg by mouth daily.    [provider]  chlorthalidone (HYGROTON) 50 MG tablet Take 25 mg by mouth daily.    [provider]  Cholecalciferol 25 MCG (1000 UT) tablet Take by mouth. 06/27/19   [provider]   Cyanocobalamin 1000 MCG CAPS Take 1 capsule by mouth daily. 06/03/22   [provider]  cyclobenzaprine (FLEXERIL) 10 MG tablet Take 10 mg by mouth as needed.     [provider]  Dulaglutide (TRULICITY [place]) Inject 1 Dose into the skin once a week.    [provider]  EPINEPHrine 0.3 mg/0.3 mL IJ SOAJ injection Inject 0.3 mg into the muscle as needed.    [provider]  famotidine (PEPCID) 20 MG tablet One at bedtime 06/27/14   Nyoka Cowden, MD  fexofenadine (ALLEGRA) 180 MG tablet Take 180 mg by mouth daily. 12/02/22   [provider]  finasteride (PROSCAR) 5 MG tablet Take 5 mg by mouth daily.    [provider]  fluticasone (FLONASE) 50 MCG/ACT nasal spray Place into both nostrils daily.    [provider]  furosemide (LASIX) 20 MG tablet Take by mouth. 03/07/23   [provider]  gabapentin (NEURONTIN) 400 MG capsule Take 600 mg by mouth 2 (two) times daily. Increased to 600mg     [provider]  hydrocortisone (CORTEF) 5 MG tablet Take 5 mg by mouth 2 (two) times daily.    [provider]  ketoconazole (NIZORAL) 2 % cream Apply 1 application topically daily as needed for irritation (feet).  10/07/13   [provider]  lidocaine (LIDODERM) 5 % Place 1 patch onto the skin as needed. 06/03/22   [provider]  propranolol (INDERAL) 80 MG tablet Take 120 mg by mouth daily. 01/17/22   [provider]  rivaroxaban (XARELTO) 20 MG TABS tablet Take 20 mg by mouth daily with supper. 06/03/22   [provider]  rosuvastatin (CRESTOR) 20 MG tablet Take 1 tablet (20 mg total) by mouth daily. 02/26/21   Jake Bathe, MD  spironolactone (ALDACTONE) 25 MG tablet Take 0.5 tablets (12.5 mg total) by mouth daily. 06/05/23   Jake Bathe, MD  topiramate (TOPAMAX) 50 MG tablet TAKE ONE TABLET BY MOUTH TWO TIMES A DAY START AFTER COMPLETING 25 MG TABLETS 08/04/20   [provider]   traMADol (ULTRAM) 50 MG tablet Take 50 mg by mouth 2 (two) times daily as needed. 12/26/22   [provider]      Allergies    Penicillins, Amlodipine, Amlodipine besylate, Atorvastatin, Erenumab-aooe, Lisinopril, Penicillin g, and Simvastatin    Review of Systems   Review of Systems  Physical Exam Updated Vital Signs BP (!) 162/95 (BP Location: Right Arm)   Pulse (!) 56   Temp 97.7 F (36.5 C)   Resp 18   Ht 5\' 11"  (1.803 m)   Wt 114 kg   SpO2 99%   BMI 35.05 kg/m  Physical Exam Vitals and nursing note reviewed.  Cardiovascular:     Rate and Rhythm: Bradycardia present.  Pulmonary:     Breath sounds: No wheezing.  Abdominal:     Tenderness: There is no abdominal tenderness.  Neurological:     Mental Status: He is alert.     ED Results / Procedures / Treatments   Labs (all labs ordered are listed, but only abnormal results are displayed) Labs Reviewed  D-DIMER, QUANTITATIVE  BASIC METABOLIC PANEL  CBC    EKG None  Radiology ECHOCARDIOGRAM COMPLETE Result Date: 08/04/2023    ECHOCARDIOGRAM REPORT   Patient Name:   [name] Whitaker [name] Whitaker Date of Exam: 08/04/2023 Medical Rec #:  [id number]       Height:       71.0 in Accession #:    [id number]      Weight:       253.0 lb Date of Birth:  Jul 28, 1950        BSA:          2.330 m Patient Age:    72 years        BP:           160/88 mmHg Patient Gender: M               HR:           64 bpm. Exam Location:  [address] Procedure: 2D Echo, Cardiac Doppler and Color Doppler (Both Spectral and Color            Flow Doppler were utilized during procedure). Indications:    R94.31 Abnormal EKG                 I77.810 Dilated aortic root  History:        Patient has prior history of Echocardiogram examinations, most                 recent 02/21/2022. Abnormal ECG, Dilated aortic root; Risk                 Factors:Hypertension, Dyslipidemia and Obesity.  Sonographer:    Samule Ohm RDCS  Referring Phys: [id number] MARK C SKAINS  IMPRESSIONS  1. Left ventricular ejection fraction, by estimation, is 60 to 65%. The left ventricle has normal function. The left ventricle has no regional wall motion abnormalities. Left ventricular diastolic parameters were normal.  2. Right ventricular systolic function is normal. The right ventricular size is normal.  3. The mitral valve is abnormal. Trivial mitral valve regurgitation. No evidence of mitral stenosis.  4. The aortic valve is tricuspid. Aortic valve regurgitation is mild. No aortic stenosis is present.  5. Cannot r/o dissection plane in ascending aorta. Tech to have patient see office DOD and order gated chest CTA today. Aortic dilatation noted. There is severe dilatation of the ascending aorta, measuring 47 mm.  6. The inferior vena cava is normal in size with greater than 50% respiratory variability, suggesting right atrial pressure of 3 mmHg. FINDINGS  Left Ventricle: Left ventricular ejection fraction, by estimation, is 60 to 65%. The left ventricle has normal function. The left ventricle has no regional wall motion abnormalities. Strain was performed and the global longitudinal strain is indeterminate. The left ventricular internal cavity size was normal in size. There is no left ventricular hypertrophy. Left ventricular diastolic parameters were normal. Right Ventricle: The right ventricular size is normal. No increase in right ventricular wall thickness. Right ventricular systolic function is normal. Left Atrium: Left atrial size was normal in size. Right Atrium: Right atrial size was normal in size. Pericardium: There is no evidence of pericardial effusion. Mitral Valve: The mitral valve is abnormal. There is mild thickening of the mitral valve leaflet(s). Trivial mitral valve regurgitation. No evidence of mitral valve stenosis. Tricuspid Valve: The tricuspid valve is normal in structure. Tricuspid valve regurgitation is not demonstrated. No evidence of tricuspid stenosis. Aortic Valve:  The aortic valve is tricuspid. Aortic valve regurgitation is mild. Aortic regurgitation PHT measures 554 msec. No aortic stenosis is present. Pulmonic Valve: The pulmonic valve was normal in structure. Pulmonic valve regurgitation is not visualized. No evidence of pulmonic stenosis. Aorta: Cannot r/o dissection plane in ascending aorta. Tech to have patient see office DOD and order gated chest CTA today. Aortic dilatation noted. There is severe dilatation of the ascending aorta, measuring 47 mm. Venous: The inferior vena cava is normal in size with greater than 50% respiratory variability, suggesting right atrial pressure of 3 mmHg. IAS/Shunts: No atrial level shunt detected by color flow Doppler. Additional Comments: 3D was performed not requiring image post processing on an independent workstation and was indeterminate.  LEFT VENTRICLE PLAX 2D LVIDd:         5.20 cm   Diastology LVIDs:         3.70 cm   LV e' medial:    4.57 cm/s LV PW:         1.10 cm   LV E/e' medial:  16.4 LV IVS:        1.40 cm   LV e' lateral:   8.70 cm/s LVOT diam:     2.20 cm   LV E/e' lateral: 8.6 LV SV:         74 LV SV Index:   32 LVOT Area:     3.80 cm  RIGHT VENTRICLE             IVC RV S prime:     15.40 cm/s  IVC diam: 1.10 cm TAPSE (M-mode): 1.5 cm LEFT ATRIUM             Index  RIGHT ATRIUM           Index LA diam:        3.70 cm 1.59 cm/m   RA Pressure: 3.00 mmHg LA Vol (A2C):   44.8 ml 19.23 ml/m  RA Area:     11.50 cm LA Vol (A4C):   43.6 ml 18.71 ml/m  RA Volume:   24.20 ml  10.39 ml/m LA Biplane Vol: 44.1 ml 18.93 ml/m  AORTIC VALVE LVOT Vmax:   81.40 cm/s LVOT Vmean:  55.500 cm/s LVOT VTI:    0.194 m AI PHT:      554 msec  AORTA Ao Root diam: 3.50 cm Ao Asc diam:  4.70 cm MITRAL VALVE               TRICUSPID VALVE MV Area (PHT): 3.28 cm    Estimated RAP:  3.00 mmHg MV Decel Time: 231 msec MV E velocity: 74.90 cm/s  SHUNTS MV A velocity: 91.20 cm/s  Systemic VTI:  0.19 m MV E/A ratio:  0.82        Systemic Diam:  2.20 cm Charlton Haws MD Electronically signed by Charlton Haws MD Signature Date/Time: 08/04/2023/9:00:40 AM    Final     Procedures Procedures    Medications Ordered in ED Medications - No data to display  ED Course/ Medical Decision Making/ A&P                                 Medical Decision Making Amount and/or Complexity of Data Reviewed Labs: ordered. Radiology: ordered.  Risk Prescription drug management.   Patient with potential ascending aortic dissection.  Asymptomatic.  Potentially even was there a year and a half ago.  Discussed with Dr. Abby Potash and Dr.Nishan .  Gated CT had been preferred but able to get nongated CT scan here.  CT scan done and shows no dissection and actually has less aortic diameter than on the echo.  Stable for discharge home.        Final Clinical Impression(s) / ED Diagnoses Final diagnoses:  None    Rx / DC Orders ED Discharge Orders     None         Benjiman Core, MD 08/05/23 1027

## 2023-08-07 ENCOUNTER — Telehealth: Payer: Self-pay | Admitting: Cardiology

## 2023-08-07 ENCOUNTER — Encounter: Payer: Self-pay | Admitting: Cardiology

## 2023-08-07 MED ORDER — SPIRONOLACTONE 25 MG PO TABS: 25.0000 mg | ORAL_TABLET | Freq: Every day | ORAL | 3 refills | Status: DC

## 2023-08-07 NOTE — Telephone Encounter (Signed)
 Pt has been rescheduled to 08/08/23 at 9"20 am with Dr Anne Fu

## 2023-08-07 NOTE — Telephone Encounter (Signed)
 Patient calling for results to his CT scan.  He insisted he be scheduled for a f/u appt with Dr Anne Fu to go over the results, first available was 6/4 with Dr. Anne Fu.  He is currently schedule for that day.

## 2023-08-07 NOTE — Telephone Encounter (Signed)
 Spoke with patient and he will increase spironolactone to 25 mg daily and repeat BMET in two weeks.

## 2023-08-07 NOTE — Telephone Encounter (Signed)
 Pt had an echo done (ordered by Primary Cards Dr. Anne Fu) on this past Friday and findings resulted in consultation with the DOD Dr. Rosemary Holms at that time.  From there the DOD ordered for him to go to [facility] ER for a CT ANGIO/ABD/PEL for abnormal echo finding.  This was also advised by the Echo Reader on that day, Dr. Eden Emms.   Pt did report to [facility] ER for a CT ANGIO/ABD/PEL For Dissection and results were provided to the pt same day.  Results are visible in epic.  Pt is now calling to follow-up with Dr. Anne Fu to further discuss CT findings, and as [facility] ER Provider advised for him to do at discharge.   Scheduling was not able to provide him an appt in the near future with Dr. Anne Fu, telling him first available is sometime in June with him.  Pt is needing assistance from Dr. Anne Fu RN in getting him a sooner appt that June.  Will route this call to Dr. Anne Fu RN for further assistance with this matter.  Pt may initially have to see an APP before that time, and he is in agreement with this plan.

## 2023-08-08 ENCOUNTER — Ambulatory Visit: Attending: Cardiology | Admitting: Cardiology

## 2023-08-08 ENCOUNTER — Encounter: Payer: Self-pay | Admitting: Cardiology

## 2023-08-08 VITALS — BP 130/90 | HR 58 | Ht 70.5 in | Wt 255.4 lb

## 2023-08-08 DIAGNOSIS — I2510 Atherosclerotic heart disease of native coronary artery without angina pectoris: Secondary | ICD-10-CM

## 2023-08-08 DIAGNOSIS — I10 Essential (primary) hypertension: Secondary | ICD-10-CM | POA: Diagnosis not present

## 2023-08-08 DIAGNOSIS — I77810 Thoracic aortic ectasia: Secondary | ICD-10-CM | POA: Diagnosis not present

## 2023-08-08 DIAGNOSIS — E7800 Pure hypercholesterolemia, unspecified: Secondary | ICD-10-CM

## 2023-08-08 MED ORDER — POTASSIUM CHLORIDE ER 10 MEQ PO TBCR
10.0000 meq | EXTENDED_RELEASE_TABLET | Freq: Every day | ORAL | 3 refills | Status: AC
Start: 2023-08-08 — End: ?

## 2023-08-08 NOTE — Progress Notes (Signed)
 Cardiology Office Note:  .   Date:  08/08/2023  ID:  [name] Whitaker, DOB 22-Feb-1951, MRN [id number] PCP: [facility] Providers Cardiologist:  Donato Schultz, MD    History of Present Illness: [name] Whitaker   [name] Whitaker is a 73 y.o. male Discussed the use of AI scribe software for clinical note transcription with the patient, who gave verbal consent to proceed.  History of Present Illness [name] Whitaker is a 73 year old male with coronary artery disease, hypertension, and recurrent DVTPE who presents for follow-up after a recent echocardiogram.  He was recently evaluated after an echocardiogram raised concerns for an ascending aortic dissection. A subsequent CT scan showed no evidence of dissection, and the ascending aorta was measured at 4.3 cm, indicating mild dilation. The echocardiogram had shown an artifact that was misinterpreted as a dissection.  He has been experiencing difficulty controlling his blood pressure, which has been fluctuating. On a recent occasion, his blood pressure was over 200 mmHg, possibly due to anxiety. He is currently on candesartan 32 mg daily, chlorthalidone 25 mg daily, furosemide, and spironolactone 25 mg daily. He mentions splitting the chlorthalidone pill, which may be affecting his dosage.  He has a history of coronary artery disease, which remains moderate and stable. He underwent a cardiac catheterization in 2014 with no prior PCI. A nuclear stress test in October 2023 was low risk and reassuring, with an ejection fraction of 50%, considered low normal.  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  Recent lab results show a creatinine level of 1.3, indicating stable kidney function, and a hemoglobin level of 14.6. His INR was 1.8, and his hemoglobin A1c was 6.8, indicating good diabetes control. His potassium level was 3.0, which is low, and he was previously taken off potassium supplements.      ROS: no  cp  Studies Reviewed: .        Results LABS Cr: 1.3 (08/04/2023) Hb: 14.6 (08/04/2023) INR: 1.8 (08/04/2023) HbA1c: 6.8 (08/04/2023) K: 3.0 (08/04/2023)  RADIOLOGY CT scan: No evidence of dissection, ascending aorta 43 mm (08/04/2023)  DIAGNOSTIC Nuclear stress test: Low risk, EF 50% (October 2023) Risk Assessment/Calculations:           Physical Exam:   VS:  BP (!) 130/90   Pulse (!) 58   Ht 5' 10.5" (1.791 m)   Wt 255 lb 6.4 oz (115.8 kg)   SpO2 96%   BMI 36.13 kg/m    Wt Readings from Last 3 Encounters:  08/08/23 255 lb 6.4 oz (115.8 kg)  08/04/23 251 lb 5.2 oz (114 kg)  08/01/23 253 lb (114.8 kg)    GEN: Well nourished, well developed in no acute distress NECK: No JVD; No carotid bruits CARDIAC: RRR, no murmurs, no rubs, no gallops RESPIRATORY:  Clear to auscultation without rales, wheezing or rhonchi  ABDOMEN: Soft, non-tender, non-distended EXTREMITIES:  No edema; No deformity   ASSESSMENT AND PLAN: .    Assessment and Plan Assessment & Plan Dilated Ascending Aorta The ascending aorta is mildly dilated at 43 mm, confirmed by CT scan. Previous echocardiogram suggested a larger measurement due to artifact, clarified by CT. No dissection evidence. The condition is stable, and monitoring with a scan in two years is planned to avoid unnecessary alarm from echocardiogram artifacts. - Schedule a follow-up scan in two years to monitor the ascending aorta.  Hypertension Blood pressure is difficult to control, with recent systolic readings over 200 mmHg.  Current medications include candesartan, chlorthalidone, furosemide, and spironolactone. Blood pressure improved today but remains suboptimal with a diastolic pressure of 90 mmHg. He is advised to monitor blood pressure at home with a new monitor. Potassium supplementation is considered to address low potassium levels, potentially aiding blood pressure control. - Add potassium 10 mEq daily to address low potassium  levels. - Advise obtaining a new blood pressure monitor for home use. - Monitor blood pressure regularly at home.  Hypokalemia Potassium level is 3.0, which is low. He was previously taken off potassium supplements due to spironolactone use, but supplementation is now necessary to maintain appropriate potassium levels. - Add potassium 10 mEq daily.  Coronary Artery Disease (CAD) CAD remains moderate. A nuclear stress test in October 2023 was low risk and reassuring. The ejection fraction is 50%, which is low normal. No prior PCI on cardiac catheterization in 2014.  Chronic Kidney Disease Stage 3A Creatinine levels are stable at 1.3-1.4, indicating stage 3A chronic kidney disease. This is monitored due to hypertension medications. Kidney function will be regularly monitored to ensure stability. - Monitor kidney function regularly.  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 INR was 1.8 at last check, which is acceptable.  Diabetes Mellitus Diabetes is well-controlled with a hemoglobin A1c of 6.8%.  Follow-up He will be followed up in one year unless there are changes in his condition. - Schedule a follow-up appointment in one year.          Signed, Donato Schultz, MD

## 2023-08-08 NOTE — Patient Instructions (Signed)
 Medication Instructions:  Please start potassium chloride 10 mEq daily. Continue all other medications as listed.  *If you need a refill on your cardiac medications before your next appointment, please call your pharmacy*  Follow-Up: At [facility], you and your health needs are our priority.  As part of our continuing mission to provide you with exceptional heart care, we have created designated Provider Care Teams.  These Care Teams include your primary Cardiologist (physician) and Advanced Practice Providers (APPs -  Physician Assistants and Nurse Practitioners) who all work together to provide you with the care you need, when you need it.  We recommend signing up for the patient portal called "MyChart".  Sign up information is provided on this After Visit Summary.  MyChart is used to connect with patients for Virtual Visits (Telemedicine).  Patients are able to view lab/test results, encounter notes, upcoming appointments, etc.  Non-urgent messages can be sent to your provider as well.   To learn more about what you can do with MyChart, go to [email or web address].    Your next appointment:   1 year(s)  Provider:   Donato Schultz, MD       1st Floor: - Lobby - Registration  - Pharmacy  - Lab - Cafe  2nd Floor: - PV Lab - Diagnostic Testing (echo, CT, nuclear med)  3rd Floor: - Vacant  4th Floor: - TCTS (cardiothoracic surgery) - AFib Clinic - Structural Heart Clinic - Vascular Surgery  - Vascular Ultrasound  5th Floor: - HeartCare Cardiology (general and EP) - Clinical Pharmacy for coumadin, hypertension, lipid, weight-loss medications, and med management appointments    Valet parking services will be available as well.

## 2023-08-21 NOTE — Progress Notes (Unsigned)
 PROVIDER NOTE: Information contained herein reflects review and annotations entered in association with encounter. Interpretation of such information and data should be left to medically-trained personnel. Information provided to patient can be located elsewhere in the medical record under "Patient Instructions". Document created using STT-dictation technology, any transcriptional errors that may result from process are unintentional.    Patient: [name] Whitaker  Service Category: E/M  Provider: Oswaldo Done, MD  DOB: [birth year]  DOS: 08/22/2023  Referring Provider: [facility], [facility]  MRN: [id number]  Specialty: Interventional Pain Management  PCP: [facility], [facility]  Type: Established Patient  Setting: Ambulatory outpatient    Location: Office  Delivery: Face-to-face     HPI  [name], a 73 y.o. year old male, is here today because of his Chronic hip pain, right [M25.551, G89.29]. Mr. Beavers's primary complain today is No chief complaint on file.  Pertinent problems: 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, L4-5, L5-S1); Lumbar facet joint syndrome (Bilateral) (R>L); DDD (degenerative disc disease), lumbosacral; Chronic hip pain (Bilateral); Chronic low back pain (1ry area of Pain) (Bilateral) (R>L) w/o sciatica; Chronic sacroiliac joint pain (Bilateral) (R>L); Other intervertebral disc degeneration, lumbar region; Osteoarthritis of hip (Right); Arthritis of hip (Right); Osteoarthritis of hips (Bilateral); Somatic dysfunction of sacroiliac joints (Bilateral) (R>L); Other spondylosis, sacral and sacrococcygeal region; Migraine with aura, not intractable,  without status migrainosus; Primary osteoarthritis, left ankle and foot; Osteoarthritis of left foot; Lumbosacral spondylosis without myelopathy; Spinal stenosis of lumbar region; Grade 1 Anterolisthesis (2mm) of lumbar spine of L4/L5; Statin myopathy; Chronic hip pain (Right); Lumbar facet joint pain; Chronic gouty arthritis; Trigger point with back pain; Lumbar trigger point syndrome; and Greater trochanteric bursitis of hip (Right) on their pertinent problem list. Pain Assessment: Severity of   is reported as a  /10. Location:    / . Onset:  . Quality:  . Timing:  . Modifying factor(s):  Marland Kitchen Vitals:  vitals were not taken for this visit.  BMI: Estimated body mass index is 36.13 kg/m as calculated from the following:   Height as of 08/08/23: 5' 10.5" (1.791 m).   Weight as of 08/08/23: 255 lb 6.4 oz (115.8 kg). Last encounter: 07/05/2023. Last procedure: 08/01/2023.  Reason for encounter: post-procedure evaluation and assessment. ***  Discussed the use of AI scribe software for clinical note transcription with the patient, who gave verbal consent to proceed.  History of Present Illness          Post-procedure evaluation   Type: Hip & bursae injection #4  Laterality: Right (-RT)  Bursae: Trochanteric #2 Laterality: Right (-RT)  Approach: Percutaneous posterolateral approach. Level: Lower pelvic and hip joint level.  Imaging: Fluoroscopy-guided Non-spinal ([id number]) Anesthesia: Local anesthesia (1-2% Lidocaine) Anxiolysis: IV                 Sedation: No Sedation                       DOS: 08/01/2023  Performed by: Oswaldo Done, MD  Purpose: Diagnostic/Therapeutic Indications: Hip pain severe enough to impact quality of life or function. Rationale (medical necessity): procedure needed and proper for the diagnosis and/or treatment of [name]'s medical symptoms and needs. 1. Chronic hip pain (Right)  2. Arthritis of hip (Right)   3. Greater trochanteric bursitis of hip  (Right)   4. Osteoarthritis of hip (Right)   5. Chronic anticoagulation (Xarelto)    NAS-11 Pain score:   Pre-procedure: 7 /10   Post-procedure: 7 /10       Effectiveness:  Initial hour after procedure:   ***. Subsequent 4-6 hours post-procedure:   ***. Analgesia past initial 6 hours:   ***. Ongoing improvement:  Analgesic:  *** Function:    ***    ROM:    ***      Pharmacotherapy Assessment  Analgesic: No opioid analgesics prescribed by our practice. Highest recorded MME/day: 26.67 mg/day MME/day: 0 mg/day    Monitoring: [place] PMP: PDMP reviewed during this encounter.       Pharmacotherapy: No side-effects or adverse reactions reported. Compliance: No problems identified. Effectiveness: Clinically acceptable.  No notes on file  No results found for: "CBDTHCR" No results found for: "D8THCCBX" No results found for: "D9THCCBX"  UDS:  No results found for: "SUMMARY"    ROS  Constitutional: Denies any fever or chills Gastrointestinal: No reported hemesis, hematochezia, vomiting, or acute GI distress Musculoskeletal: Denies any acute onset joint swelling, redness, loss of ROM, or weakness Neurological: No reported episodes of acute onset apraxia, aphasia, dysarthria, agnosia, amnesia, paralysis, loss of coordination, or loss of consciousness  Medication Review  Cholecalciferol, Cyanocobalamin, Dulaglutide, EPINEPHrine, acetaminophen, allopurinol, ascorbic acid, candesartan, chlorthalidone, cyclobenzaprine, famotidine, fexofenadine, finasteride, fluticasone, furosemide, gabapentin, hydrocortisone, ketoconazole, lidocaine, potassium chloride, propranolol, rivaroxaban, rosuvastatin, spironolactone, topiramate, and traMADol  History Review  Allergy: [name] is allergic to penicillins, amlodipine, amlodipine besylate, atorvastatin, erenumab-aooe, lisinopril, penicillin g, and simvastatin. Drug: [name]  reports no history of drug use. Alcohol:  reports no history of  alcohol use. Tobacco:  reports that he quit smoking about 40 years ago. His smoking use included pipe. He has never used smokeless tobacco. Social: [name]  reports that he quit smoking about 40 years ago. His smoking use included pipe. He has never used smokeless tobacco. He reports that he does not drink alcohol and does not use drugs. Medical:  has a past medical history of Abnormal ECG (09/27/2013), Chest pain (09/27/2013), Dilated aortic root (HCC) (09/27/2013), DVT, lower extremity (HCC) (10/27/2011), Dyspnea (06/27/2014), GERD (gastroesophageal reflux disease), Gout, History of DVT (deep vein thrombosis) (04/14/2019), History of pulmonary embolism (04/14/2019), HTN (hypertension) (10/25/2011), Hypercholesteremia, Migraine, OSA (obstructive sleep apnea), and Pre-diabetes. Surgical: Mr. Milewski  has a past surgical history that includes Appendectomy; Nasal septum surgery; Tonsillectomy; Testicle surgery; Cardiac catheterization; Back surgery; Cataract extraction w/PHACO (Right, 10/20/2020); and Cataract extraction w/PHACO (Left, 11/03/2020). Family: family history includes Clotting disorder in his brother; Hypertension in his father and mother.  Laboratory Chemistry Profile   Renal Lab Results  Component Value Date   BUN 9 08/04/2023   CREATININE 1.33 (H) 08/04/2023   BCR 9 (L) 02/25/2021   GFRAA 62 04/15/2019   GFRNONAA 58 (L) 08/04/2023    Hepatic Lab Results  Component Value Date   AST 20 07/18/2022   ALT 20 07/18/2022   ALBUMIN 3.7 07/18/2022   ALKPHOS 55 07/18/2022   LIPASE 19 03/15/2007    Electrolytes Lab Results  Component Value Date   NA 143 08/04/2023   K 3.0 (L) 08/04/2023   CL 105 08/04/2023   CALCIUM 8.7 (L) 08/04/2023   MG 2.2 04/15/2019    Bone Lab Results  Component Value Date   25OHVITD1 43 04/15/2019   25OHVITD2 <1.0 04/15/2019  25OHVITD3 43 04/15/2019    Inflammation (CRP: Acute Phase) (ESR: Chronic Phase) Lab Results  Component Value Date   CRP 2  04/15/2019   ESRSEDRATE 6 04/15/2019         Note: Above Lab results reviewed.  Recent Imaging Review  MR HIP RIGHT WO CONTRAST CLINICAL DATA:  Chronic right hip pain  EXAM: MR OF THE RIGHT HIP WITHOUT CONTRAST  TECHNIQUE: Multiplanar, multisequence MR imaging was performed. No intravenous contrast was administered.  COMPARISON:  None Available.  FINDINGS: Bones:  No hip fracture, dislocation or avascular necrosis.  No periosteal reaction or bone destruction. No aggressive osseous lesion.  Mild osteoarthritis of bilateral SI joints. No SI joint widening or erosive changes.  Articular cartilage and labrum  Articular cartilage: High-grade partial-thickness cartilage loss of the right femoral head and acetabulum. Partial-thickness cartilage loss of the left femoral head and acetabulum.  Labrum: Degenerative tearing of the right anterosuperior labrum and degeneration of the right superior labrum.  Joint or bursal effusion  Joint effusion:  No hip joint effusion.  No SI joint effusion.  Bursae:  No bursal fluid.  Muscles and tendons  Flexors: Normal.  Extensors: Normal.  Abductors: Normal.  Adductors: Normal.  Gluteals: Normal.  Hamstrings: Mild tendinosis and partial-thickness tear of the right hamstring origin.  Other findings  No pelvic free fluid. No fluid collection or hematoma. No inguinal lymphadenopathy. No inguinal hernia.  IMPRESSION: 1. Moderate osteoarthritis of bilateral hips. 2. Degenerative tearing of the right anterosuperior labrum and degeneration of the right superior labrum. 3. Mild tendinosis and partial-thickness tear of the right hamstring origin.  Electronically Signed   By: Elige Ko M.D.   On: 08/14/2023 15:42 Note: Reviewed        Physical Exam  General appearance: Well nourished, well developed, and well hydrated. In no apparent acute distress Mental status: Alert, oriented x 3 (person, place, & time)        Respiratory: No evidence of acute respiratory distress Eyes: PERLA Vitals: There were no vitals taken for this visit. BMI: Estimated body mass index is 36.13 kg/m as calculated from the following:   Height as of 08/08/23: 5' 10.5" (1.791 m).   Weight as of 08/08/23: 255 lb 6.4 oz (115.8 kg). Ideal: Ideal body weight: 74.1 kg (163 lb 7.5 oz) Adjusted ideal body weight: 90.8 kg (200 lb 3.9 oz)  Assessment   Diagnosis Status  1. Chronic hip pain (Right)   2. Arthritis of hip (Right)   3. Greater trochanteric bursitis of hip (Right)   4. Postop check    Controlled Controlled Controlled   Updated Problems: No problems updated.  Plan of Care  Problem-specific:  Assessment and Plan            Mr. Dervin Vore has a current medication list which includes the following long-term medication(s): allopurinol, candesartan, chlorthalidone, famotidine, fexofenadine, furosemide, potassium chloride, rosuvastatin, spironolactone, and topiramate.  Pharmacotherapy (Medications Ordered): No orders of the defined types were placed in this encounter.  Orders:  No orders of the defined types were placed in this encounter.  Follow-up plan:   No follow-ups on file.      Interventional Therapies  Risk factors  Considerations:   NOTE: Xarelto Anticoagulation (Stop: 3 days  Restart: 6 hours)  HTN  CKD     Planned  Pending:   Therapeutic right IA hip #4 + right trochanteric bursa injection #2   Under consideration:   Therapeutic left lumbar facet RFA #3 Possible right  opturator + femoral NB  Possible right opturator + femoral nerve RFA    Completed:   Therapeutic right IA hip joint inj. x3 (04/27/2023) (100/100/100/90) Therapeutic right trochanteric bursa inj. x1 (04/27/2023) (100/100/100/90) Diagnostic bilateral SI joint Blk x2 (03/10/2020) (50/50/100 x 5 wks/0)  Diagnostic/therapeutic right SI joint Blk x3 (12/15/2022) (100/100/0/0)  Diagnostic bilateral lumbar facet MBB x1  (05/07/2020) (100/100/100 x1 week/>75)  Therapeutic left lumbar facet RFA x3 (06/16/2022) (100/100/100/100)  Therapeutic right lumbar facet RFA x3 (05/24/2022) (100/100/80/80) (by 06/16/2022 it had progressed to 100% ongoing relief)   Palliative options:   Palliative right IA hip joint inj.  Palliative bilateral SI joint Blk  Therapeutic/palliative lumbar facet RFA       Recent Visits Date Type Provider Dept  08/01/23 Procedure visit Delano Metz, MD [facility]  07/05/23 Office Visit Delano Metz, MD [facility]  Showing recent visits within past 90 days and meeting all other requirements Future Appointments Date Type Provider Dept  08/22/23 Appointment Delano Metz, MD [facility]  Showing future appointments within next 90 days and meeting all other requirements  I discussed the assessment and treatment plan with the patient. The patient was provided an opportunity to ask questions and all were answered. The patient agreed with the plan and demonstrated an understanding of the instructions.  Patient advised to call back or seek an in-person evaluation if the symptoms or condition worsens.  Duration of encounter: *** minutes.  Total time on encounter, as per AMA guidelines included both the face-to-face and non-face-to-face time personally spent by the physician and/or other qualified health care professional(s) on the day of the encounter (includes time in activities that require the physician or other qualified health care professional and does not include time in activities normally performed by clinical staff). Physician's time may include the following activities when performed: Preparing to see the patient (e.g., pre-charting review of records, searching for previously ordered imaging, lab work, and nerve conduction tests) Review of prior analgesic pharmacotherapies. Reviewing PMP Interpreting ordered tests (e.g., lab work, imaging,  nerve conduction tests) Performing post-procedure evaluations, including interpretation of diagnostic procedures Obtaining and/or reviewing separately obtained history Performing a medically appropriate examination and/or evaluation Counseling and educating the patient/family/caregiver Ordering medications, tests, or procedures Referring and communicating with other health care professionals (when not separately reported) Documenting clinical information in the electronic or other health record Independently interpreting results (not separately reported) and communicating results to the patient/ family/caregiver Care coordination (not separately reported)  Note by: Oswaldo Done, MD Date: 08/22/2023; Time: 4:42 AM

## 2023-08-22 ENCOUNTER — Ambulatory Visit: Attending: Pain Medicine | Admitting: Pain Medicine

## 2023-08-22 ENCOUNTER — Encounter: Payer: Self-pay | Admitting: Pain Medicine

## 2023-08-22 VITALS — BP 134/86 | HR 69 | Temp 97.2°F | Resp 16 | Ht 70.5 in | Wt 255.0 lb

## 2023-08-22 DIAGNOSIS — G8929 Other chronic pain: Secondary | ICD-10-CM | POA: Insufficient documentation

## 2023-08-22 DIAGNOSIS — M25551 Pain in right hip: Secondary | ICD-10-CM | POA: Diagnosis not present

## 2023-08-22 DIAGNOSIS — M7061 Trochanteric bursitis, right hip: Secondary | ICD-10-CM | POA: Insufficient documentation

## 2023-08-22 DIAGNOSIS — Z7901 Long term (current) use of anticoagulants: Secondary | ICD-10-CM | POA: Diagnosis not present

## 2023-08-22 DIAGNOSIS — I719 Aortic aneurysm of unspecified site, without rupture: Secondary | ICD-10-CM | POA: Insufficient documentation

## 2023-08-22 DIAGNOSIS — M1611 Unilateral primary osteoarthritis, right hip: Secondary | ICD-10-CM | POA: Diagnosis not present

## 2023-08-22 DIAGNOSIS — Z09 Encounter for follow-up examination after completed treatment for conditions other than malignant neoplasm: Secondary | ICD-10-CM | POA: Diagnosis not present

## 2023-08-22 NOTE — Patient Instructions (Signed)

## 2023-08-22 NOTE — Progress Notes (Signed)
 Safety precautions to be maintained throughout the outpatient stay will include: orient to surroundings, keep bed in low position, maintain call bell within reach at all times, provide assistance with transfer out of bed and ambulation.

## 2023-08-26 ENCOUNTER — Encounter (HOSPITAL_COMMUNITY): Payer: Self-pay

## 2023-08-26 ENCOUNTER — Emergency Department (HOSPITAL_COMMUNITY)
Admission: EM | Admit: 2023-08-26 | Discharge: 2023-08-26 | Disposition: A | Discharge status: Home / Self Care | Attending: Emergency Medicine | Admitting: Emergency Medicine

## 2023-08-26 ENCOUNTER — Emergency Department (HOSPITAL_COMMUNITY)

## 2023-08-26 DIAGNOSIS — M546 Pain in thoracic spine: Secondary | ICD-10-CM | POA: Diagnosis present

## 2023-08-26 DIAGNOSIS — M549 Dorsalgia, unspecified: Secondary | ICD-10-CM | POA: Diagnosis not present

## 2023-08-26 DIAGNOSIS — R1031 Right lower quadrant pain: Secondary | ICD-10-CM | POA: Insufficient documentation

## 2023-08-26 DIAGNOSIS — Z743 Need for continuous supervision: Secondary | ICD-10-CM | POA: Diagnosis not present

## 2023-08-26 DIAGNOSIS — Y92410 Unspecified street and highway as the place of occurrence of the external cause: Secondary | ICD-10-CM | POA: Diagnosis not present

## 2023-08-26 LAB — COMPREHENSIVE METABOLIC PANEL WITH GFR
ALT: 22 U/L (ref 0–44)
AST: 23 U/L (ref 15–41)
Albumin: 3.6 g/dL (ref 3.5–5.0)
Alkaline Phosphatase: 51 U/L (ref 38–126)
Anion gap: 13 (ref 5–15)
BUN: 13 mg/dL (ref 8–23)
CO2: 22 mmol/L (ref 22–32)
Calcium: 9.4 mg/dL (ref 8.9–10.3)
Chloride: 106 mmol/L (ref 98–111)
Creatinine, Ser: 1.5 mg/dL — ABNORMAL HIGH (ref 0.61–1.24)
GFR, Estimated: 50 mL/min — ABNORMAL LOW (ref >60)
Glucose, Bld: 106 mg/dL — ABNORMAL HIGH (ref 70–99)
Potassium: 4.1 mmol/L (ref 3.5–5.1)
Sodium: 141 mmol/L (ref 135–145)
Total Bilirubin: 1 mg/dL (ref 0.0–1.2)
Total Protein: 6.6 g/dL (ref 6.5–8.1)

## 2023-08-26 LAB — I-STAT CHEM 8, ED
BUN: 15 mg/dL (ref 8–23)
Calcium, Ion: 1.09 mmol/L — ABNORMAL LOW (ref 1.15–1.40)
Chloride: 108 mmol/L (ref 98–111)
Creatinine, Ser: 1.5 mg/dL — ABNORMAL HIGH (ref 0.61–1.24)
Glucose, Bld: 103 mg/dL — ABNORMAL HIGH (ref 70–99)
HCT: 42 % (ref 39.0–52.0)
Hemoglobin: 14.3 g/dL (ref 13.0–17.0)
Potassium: 4.2 mmol/L (ref 3.5–5.1)
Sodium: 143 mmol/L (ref 135–145)
TCO2: 26 mmol/L (ref 22–32)

## 2023-08-26 LAB — CBC
HCT: 44.2 % (ref 39.0–52.0)
Hemoglobin: 14.1 g/dL (ref 13.0–17.0)
MCH: 28.5 pg (ref 26.0–34.0)
MCHC: 31.9 g/dL (ref 30.0–36.0)
MCV: 89.5 fL (ref 80.0–100.0)
Platelets: 223 10*3/uL (ref 150–400)
RBC: 4.94 MIL/uL (ref 4.22–5.81)
RDW: 14.6 % (ref 11.5–15.5)
WBC: 6.7 10*3/uL (ref 4.0–10.5)
nRBC: 0 % (ref 0.0–0.2)

## 2023-08-26 LAB — SAMPLE TO BLOOD BANK

## 2023-08-26 LAB — PROTIME-INR
INR: 2.7 — ABNORMAL HIGH (ref 0.8–1.2)
Prothrombin Time: 29.1 s — ABNORMAL HIGH (ref 11.4–15.2)

## 2023-08-26 LAB — I-STAT CG4 LACTIC ACID, ED: Lactic Acid, Venous: 1.5 mmol/L (ref 0.5–1.9)

## 2023-08-26 LAB — ETHANOL: Alcohol, Ethyl (B): <10 mg/dL (ref <10)

## 2023-08-26 MED ORDER — ONDANSETRON HCL 4 MG/2ML IJ SOLN
4.0000 mg | Freq: Once | INTRAMUSCULAR | Status: AC
  Administered 2023-08-26: 4 mg via INTRAVENOUS
  Filled 2023-08-26: qty 2

## 2023-08-26 MED ORDER — IOHEXOL 350 MG/ML SOLN
75.0000 mL | Freq: Once | INTRAVENOUS | Status: AC | PRN
  Administered 2023-08-26: 75 mL via INTRAVENOUS

## 2023-08-26 MED ORDER — DICLOFENAC SODIUM 1 % EX GEL: 4.0000 g | Freq: Four times a day (QID) | CUTANEOUS | 0 refills | Status: AC

## 2023-08-26 MED ORDER — MORPHINE SULFATE (PF) 4 MG/ML IV SOLN
4.0000 mg | Freq: Once | INTRAVENOUS | Status: AC
  Administered 2023-08-26: 4 mg via INTRAVENOUS
  Filled 2023-08-26: qty 1

## 2023-08-26 NOTE — Progress Notes (Signed)
 Orthopedic Tech Progress Note Patient Details:  [name] Whitaker Jan 02, 1951 [id number] Level 2 Trauma Patient ID: [name] Whitaker, male   DOB: 23-Sep-1950, 73 y.o.   MRN: [id number]  [name] Whitaker 08/26/2023, 6:27 PM
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
## 2023-08-26 NOTE — ED Notes (Signed)
 Pt provided with paper scrubs upon discharge home.

## 2023-08-26 NOTE — ED Notes (Signed)
 Patient transported to CT with Penni Bombard RN.

## 2023-08-28 DIAGNOSIS — I7121 Aneurysm of the ascending aorta, without rupture: Secondary | ICD-10-CM | POA: Diagnosis not present

## 2023-08-28 DIAGNOSIS — M62830 Muscle spasm of back: Secondary | ICD-10-CM | POA: Diagnosis not present

## 2023-08-28 DIAGNOSIS — K862 Cyst of pancreas: Secondary | ICD-10-CM | POA: Diagnosis not present
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
## 2023-09-07 ENCOUNTER — Ambulatory Visit: Admitting: Pharmacist

## 2023-09-18 DIAGNOSIS — I129 Hypertensive chronic kidney disease with stage 1 through stage 4 chronic kidney disease, or unspecified chronic kidney disease: Secondary | ICD-10-CM | POA: Diagnosis not present

## 2023-09-18 DIAGNOSIS — N1832 Chronic kidney disease, stage 3b: Secondary | ICD-10-CM | POA: Diagnosis not present

## 2023-09-18 DIAGNOSIS — I5020 Unspecified systolic (congestive) heart failure: Secondary | ICD-10-CM | POA: Diagnosis not present

## 2023-09-18 DIAGNOSIS — E1122 Type 2 diabetes mellitus with diabetic chronic kidney disease: Secondary | ICD-10-CM | POA: Diagnosis not present

## 2023-09-18 DIAGNOSIS — I2699 Other pulmonary embolism without acute cor pulmonale: Secondary | ICD-10-CM | POA: Diagnosis not present

## 2023-09-18 DIAGNOSIS — N1330 Unspecified hydronephrosis: Secondary | ICD-10-CM | POA: Diagnosis not present

## 2023-09-18 DIAGNOSIS — D352 Benign neoplasm of pituitary gland: Secondary | ICD-10-CM | POA: Diagnosis not present

## 2023-09-25 DIAGNOSIS — E221 Hyperprolactinemia: Secondary | ICD-10-CM | POA: Diagnosis not present

## 2023-09-25 DIAGNOSIS — N1832 Chronic kidney disease, stage 3b: Secondary | ICD-10-CM | POA: Diagnosis not present

## 2023-09-25 DIAGNOSIS — E1122 Type 2 diabetes mellitus with diabetic chronic kidney disease: Secondary | ICD-10-CM | POA: Diagnosis not present

## 2023-09-25 DIAGNOSIS — D352 Benign neoplasm of pituitary gland: Secondary | ICD-10-CM | POA: Diagnosis not present

## 2023-09-27 ENCOUNTER — Other Ambulatory Visit: Payer: Self-pay | Admitting: Internal Medicine

## 2023-09-27 DIAGNOSIS — N1832 Chronic kidney disease, stage 3b: Secondary | ICD-10-CM

## 2023-10-05 ENCOUNTER — Ambulatory Visit
Admission: RE | Admit: 2023-10-05 | Discharge: 2023-10-05 | Disposition: A | Discharge status: Home / Self Care | Source: Ambulatory Visit | Attending: Internal Medicine | Admitting: Internal Medicine

## 2023-10-05 DIAGNOSIS — N1832 Chronic kidney disease, stage 3b: Secondary | ICD-10-CM

## 2023-10-05 MED ORDER — GADOPICLENOL 0.5 MMOL/ML IV SOLN
10.0000 mL | Freq: Once | INTRAVENOUS | Status: AC | PRN
Start: 2023-10-05 — End: 2023-10-05
  Administered 2023-10-05: 10 mL via INTRAVENOUS

## 2023-10-25 ENCOUNTER — Ambulatory Visit: Admitting: Cardiology

## 2023-11-02 ENCOUNTER — Ambulatory Visit: Attending: Cardiology | Admitting: Pharmacist

## 2023-11-02 ENCOUNTER — Encounter: Payer: Self-pay | Admitting: Pharmacist

## 2023-11-02 DIAGNOSIS — I10 Essential (primary) hypertension: Secondary | ICD-10-CM

## 2023-11-02 NOTE — Patient Instructions (Signed)
 NO Changes made to your BP medications  by your pharmacist Nickola Baron, PharmD at today's visit: Bring all of your meds, your BP cuff and your record of home blood pressures to your next appointment.    HOW TO TAKE YOUR BLOOD PRESSURE AT HOME  Rest 5 minutes before taking your blood pressure.  Don't smoke or drink caffeinated beverages for at least 30 minutes before. Take your blood pressure before (not after) you eat. Sit comfortably with your back supported and both feet on the floor (don't cross your legs). Elevate your arm to heart level on a table or a desk. Use the proper sized cuff. It should fit smoothly and snugly around your bare upper arm. There should be enough room to slip a fingertip under the cuff. The bottom edge of the cuff should be 1 inch above the crease of the elbow. Ideally, take 3 measurements at one sitting and record the average.  Important lifestyle changes to control high blood pressure  Intervention  Effect on the BP  Lose extra pounds and watch your waistline Weight loss is one of the most effective lifestyle changes for controlling blood pressure. If you're overweight or obese, losing even a small amount of weight can help reduce blood pressure. Blood pressure might go down by about 1 millimeter of mercury (mm Hg) with each kilogram (about 2.2 pounds) of weight lost.  Exercise regularly As a general goal, aim for at least 30 minutes of moderate physical activity every day. Regular physical activity can lower high blood pressure by about 5 to 8 mm Hg.  Eat a healthy diet Eating a diet rich in whole grains, fruits, vegetables, and low-fat dairy products and low in saturated fat and cholesterol. A healthy diet can lower high blood pressure by up to 11 mm Hg.  Reduce salt (sodium) in your diet Even a small reduction of sodium in the diet can improve heart health and reduce high blood pressure by about 5 to 6 mm Hg.  Limit alcohol One drink equals 12 ounces of beer,  5 ounces of wine, or 1.5 ounces of 80-proof liquor.  Limiting alcohol to less than one drink a day for women or two drinks a day for men can help lower blood pressure by about 4 mm Hg.   If you have any questions or concerns please use My Chart to send questions or call the office at [phone]
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
## 2023-11-02 NOTE — Assessment & Plan Note (Addendum)
 Assessment: BP is controlled in office BP 109/73 mmHg heart rate 73  above the goal (<130/80) Home BP varies a lot and reads high compared to today's reading - reports machine is never validated  Takes current BP meds and tolerates them well without any side effects  Denies SOB, palpitation, chest pain, headaches,or swelling Diet need improvement in lowering sodium intake and water intake; patient is wiling to work on that  [employer] to restart 30 min walks per day   Plan:  Given normal BP no changes to the current BP medications  Continue taking candesartan 32 mg, spironolactone  25 mg, chlorthalidone 12.5 mg (splits 25 mg tablet in half), furosemide 20 mg, propranolol  120 mg (for headaches)  Patient to keep record of BP readings with heart rate and report to us  at the next visit Patient to bring BP monitor for validation at next OV Patient to see PharmD in 6 weeks for follow up  Follow up lab(s): none

## 2023-12-18 ENCOUNTER — Encounter: Payer: Self-pay | Admitting: Pharmacist

## 2023-12-18 ENCOUNTER — Ambulatory Visit: Attending: Internal Medicine | Admitting: Pharmacist

## 2023-12-18 VITALS — BP 138/87 | HR 71

## 2023-12-18 DIAGNOSIS — I10 Essential (primary) hypertension: Secondary | ICD-10-CM

## 2023-12-18 MED ORDER — OMRON 3 SERIES BP MONITOR DEVI: 1.0000 | Freq: Every day | 0 refills | Status: DC

## 2023-12-18 MED ORDER — OMRON 3 SERIES BP MONITOR DEVI: 1.0000 | Freq: Every day | 0 refills | Status: AC

## 2023-12-18 NOTE — Assessment & Plan Note (Addendum)
 Assessment: BP is uncontrolled in office BP 144/89 mmHg heart rate 71; 2nd reading 138/87 (goal <130/80) Need prescription for home BP monitor so can take it to [facility] pharmacy  Takes current BP meds and tolerates them well without any side effects  Denies SOB, palpitation, chest pain, headaches,or swelling Follows low salt diet and started short walks    Plan:  Will call us  back later today or tomorrow to inform what dose of chlorthalidone he is actually taking  Continue taking candesartan 32 mg, spironolactone  25 mg, furosemide 20 mg, propranolol  120 mg (for headaches)  Prescription for BP monitor printed for the patient  Patient to keep record of BP readings with heart rate and report to us  at the next visit Patient to bring BP monitor for validation at next OV Patient to see PharmD in 3 weeks for follow up  Follow up lab(s): none
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
## 2023-12-18 NOTE — Patient Instructions (Signed)
  Bring all of your meds, your BP cuff and your record of home blood pressures to your next appointment.    HOW TO TAKE YOUR BLOOD PRESSURE AT HOME  Rest 5 minutes before taking your blood pressure.  Don't smoke or drink caffeinated beverages for at least 30 minutes before. Take your blood pressure before (not after) you eat. Sit comfortably with your back supported and both feet on the floor (don't cross your legs). Elevate your arm to heart level on a table or a desk. Use the proper sized cuff. It should fit smoothly and snugly around your bare upper arm. There should be enough room to slip a fingertip under the cuff. The bottom edge of the cuff should be 1 inch above the crease of the elbow. Ideally, take 3 measurements at one sitting and record the average.  Important lifestyle changes to control high blood pressure  Intervention  Effect on the BP  Lose extra pounds and watch your waistline Weight loss is one of the most effective lifestyle changes for controlling blood pressure. If you're overweight or obese, losing even a small amount of weight can help reduce blood pressure. Blood pressure might go down by about 1 millimeter of mercury (mm Hg) with each kilogram (about 2.2 pounds) of weight lost.  Exercise regularly As a general goal, aim for at least 30 minutes of moderate physical activity every day. Regular physical activity can lower high blood pressure by about 5 to 8 mm Hg.  Eat a healthy diet Eating a diet rich in whole grains, fruits, vegetables, and low-fat dairy products and low in saturated fat and cholesterol. A healthy diet can lower high blood pressure by up to 11 mm Hg.  Reduce salt (sodium) in your diet Even a small reduction of sodium in the diet can improve heart health and reduce high blood pressure by about 5 to 6 mm Hg.  Limit alcohol One drink equals 12 ounces of beer, 5 ounces of wine, or 1.5 ounces of 80-proof liquor.  Limiting alcohol to less than one drink a  day for women or two drinks a day for men can help lower blood pressure by about 4 mm Hg.   If you have any questions or concerns please use My Chart to send questions or call the office at [phone]

## 2023-12-20 ENCOUNTER — Telehealth: Payer: Self-pay | Admitting: Pharmacist

## 2023-12-20 NOTE — Telephone Encounter (Signed)
 Pt LVM reported he is on 25 mg of chlorthalidone and he takes it every day. Given slightly above goal BP we will add amlodipine to his current BP meds at next office visit in 3 weeks

## 2024-01-05 ENCOUNTER — Ambulatory Visit: Admitting: Pharmacist
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
## 2024-01-15 DIAGNOSIS — Z86711 Personal history of pulmonary embolism: Secondary | ICD-10-CM | POA: Diagnosis not present

## 2024-01-15 DIAGNOSIS — Z0000 Encounter for general adult medical examination without abnormal findings: Secondary | ICD-10-CM | POA: Diagnosis not present

## 2024-01-15 DIAGNOSIS — E1122 Type 2 diabetes mellitus with diabetic chronic kidney disease: Secondary | ICD-10-CM | POA: Diagnosis not present

## 2024-01-15 DIAGNOSIS — I10 Essential (primary) hypertension: Secondary | ICD-10-CM | POA: Diagnosis not present

## 2024-01-26 DIAGNOSIS — K862 Cyst of pancreas: Secondary | ICD-10-CM | POA: Diagnosis not present

## 2024-02-12 ENCOUNTER — Ambulatory Visit: Attending: Cardiovascular Disease | Admitting: Pharmacist

## 2024-02-12 ENCOUNTER — Encounter: Payer: Self-pay | Admitting: Pharmacist

## 2024-02-12 VITALS — BP 149/89 | HR 61

## 2024-02-12 DIAGNOSIS — I10 Essential (primary) hypertension: Secondary | ICD-10-CM

## 2024-02-12 MED ORDER — AMLODIPINE BESYLATE 5 MG PO TABS
5.0000 mg | ORAL_TABLET | Freq: Every day | ORAL | 3 refills | Status: AC
Start: 2024-02-12 — End: 2024-05-12

## 2024-02-12 NOTE — Patient Instructions (Signed)
 Changes made by your pharmacist Robbi Blanch, PharmD at today's visit:    Instructions/Changes  (what do you need to do) Your Notes  (what you did and when you did it)  Start taking amlodipine  5 mg daily and continue taking rest of the other BP meds same as before except furosemide    Lower the use of salted seasoning     Bring all of your meds, your BP cuff and your record of home blood pressures to your next appointment.    HOW TO TAKE YOUR BLOOD PRESSURE AT HOME  Rest 5 minutes before taking your blood pressure.  Don't smoke or drink caffeinated beverages for at least 30 minutes before. Take your blood pressure before (not after) you eat. Sit comfortably with your back supported and both feet on the floor (don't cross your legs). Elevate your arm to heart level on a table or a desk. Use the proper sized cuff. It should fit smoothly and snugly around your bare upper arm. There should be enough room to slip a fingertip under the cuff. The bottom edge of the cuff should be 1 inch above the crease of the elbow. Ideally, take 3 measurements at one sitting and record the average.  Important lifestyle changes to control high blood pressure  Intervention  Effect on the BP  Lose extra pounds and watch your waistline Weight loss is one of the most effective lifestyle changes for controlling blood pressure. If you're overweight or obese, losing even a small amount of weight can help reduce blood pressure. Blood pressure might go down by about 1 millimeter of mercury (mm Hg) with each kilogram (about 2.2 pounds) of weight lost.  Exercise regularly As a general goal, aim for at least 30 minutes of moderate physical activity every day. Regular physical activity can lower high blood pressure by about 5 to 8 mm Hg.  Eat a healthy diet Eating a diet rich in whole grains, fruits, vegetables, and low-fat dairy products and low in saturated fat and cholesterol. A healthy diet can lower high blood  pressure by up to 11 mm Hg.  Reduce salt (sodium) in your diet Even a small reduction of sodium in the diet can improve heart health and reduce high blood pressure by about 5 to 6 mm Hg.  Limit alcohol One drink equals 12 ounces of beer, 5 ounces of wine, or 1.5 ounces of 80-proof liquor.  Limiting alcohol to less than one drink a day for women or two drinks a day for men can help lower blood pressure by about 4 mm Hg.   If you have any questions or concerns please use My Chart to send questions or call the office at [phone]
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
## 2024-02-12 NOTE — Assessment & Plan Note (Signed)
 Assessment: BP is uncontrolled in office BP 147/88 mmHg heart rate 58; 2nd reading 149/89 heart rate 61  (goal <130/80) Takes current BP meds and tolerates them well without any side effects Denies SOB, palpitation, chest pain, headaches,or swelling Need to improve on salt intake - lost 32 lbs on Trulicity  Amlodipine  higher dose was causing lower legs swelling   Home BP ~ 131/87; home BP monitor validated - found to be accurate    Plan:  Start taking amlodipine  5 mg daily  Continue taking candesartan 32 mg, spironolactone  25 mg, propranolol  120 mg twice daily  (for headaches)  Patient to keep record of BP readings with heart rate and report to us  at the next visit Patient to bring BP monitor for validation at next OV Patient to see PharmD in 4-5 weeks for follow up  Follow up lab(s): none

## 2024-02-13 NOTE — Progress Notes (Unsigned)
 PROVIDER NOTE: Interpretation of information contained herein should be left to medically-trained personnel. Specific patient instructions are provided elsewhere under Patient Instructions section of medical record. This document was created in part using AI and STT-dictation technology, any transcriptional errors that may result from this process are unintentional.  Patient: Seena Mink  Service: E/M   PCP: [facility]  DOB: Jul 28, 1950  DOS: 02/14/2024  Provider: Eric DELENA Como, MD  MRN: [id number]  Delivery: Face-to-face  Specialty: Interventional Pain Management  Type: Established Patient  Setting: Ambulatory outpatient facility  Specialty designation: 09  Referring Prov.: [facility]  Location: Outpatient office facility       History of present illness (HPI) Mr. Taiwan Talcott, a 73 y.o. year old male, is here today because of his No primary diagnosis found.. Mr. Catoe's primary complain today is No chief complaint on file.  Pertinent problems: Mr. 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, L4-5, L5-S1); Lumbar facet joint syndrome (Bilateral) (R>L); DDD (degenerative disc disease), lumbosacral; Chronic hip pain (Bilateral); Chronic low back pain (1ry area of Pain) (Bilateral) (R>L) w/o sciatica; Chronic sacroiliac joint pain (Bilateral) (R>L); Other intervertebral disc degeneration, lumbar region; Osteoarthritis of hip (Right); Arthritis of hip (Right); Osteoarthritis of hips (Bilateral); Somatic dysfunction of sacroiliac joints (Bilateral) (R>L); Other spondylosis, sacral and sacrococcygeal region; Migraine with aura, not intractable, without  status migrainosus; Primary osteoarthritis, left ankle and foot; Osteoarthritis of left foot; Lumbosacral spondylosis without myelopathy; Spinal stenosis of lumbar region; Grade 1 Anterolisthesis (2mm) of lumbar spine of L4/L5; Statin myopathy; Chronic hip pain (Right); Lumbar facet joint pain; Chronic gouty arthritis; Trigger point with back pain; Lumbar trigger point syndrome; and Greater trochanteric bursitis of hip (Right) on their pertinent problem list.  Pain Assessment: Severity of   is reported as a  /10. Location:    / . Onset:  . Quality:  . Timing:  . Modifying factor(s):  SABRA Vitals:  vitals were not taken for this visit.  BMI: Estimated body mass index is 35.57 kg/m as calculated from the following:   Height as of 08/26/23: 5' 11 (1.803 m).   Weight as of 08/26/23: 255 lb (115.7 kg).  Last encounter: 08/22/2023. Last procedure: 08/01/2023.  Reason for encounter: evaluation of worsening, or previously known (established) problem.   Discussed the use of AI scribe software for clinical note transcription with the patient, who gave verbal consent to proceed.  History of Present Illness           Pharmacotherapy Assessment   No opioid analgesics prescribed by our practice. Highest recorded MME/day: 26.67 mg/day MME/day: 0 mg/day   Monitoring: [place] PMP: PDMP reviewed during this encounter.       Pharmacotherapy: No side-effects or adverse reactions reported. Compliance: No problems identified. Effectiveness: Clinically acceptable.  No notes on file  UDS:  No results found for: SUMMARY  No results found for: CBDTHCR No results found for: D8THCCBX No results found for: D9THCCBX  ROS  Constitutional: Denies any fever or chills Gastrointestinal: No reported hemesis, hematochezia, vomiting, or acute GI distress Musculoskeletal: Denies any acute onset joint swelling, redness, loss of ROM, or weakness Neurological: No reported episodes of acute onset apraxia, aphasia,  dysarthria, agnosia, amnesia, paralysis, loss of coordination, or loss of consciousness  Medication Review  Cholecalciferol, Cyanocobalamin , Dulaglutide, EPINEPHrine , Omron 3 Series BP Monitor, acetaminophen , allopurinol , amLODipine , ascorbic acid, candesartan, chlorthalidone, cyclobenzaprine , diclofenac  Sodium, famotidine , fexofenadine, finasteride , fluticasone, furosemide, gabapentin , hydrocortisone, ketoconazole, lidocaine , potassium chloride , propranolol , rivaroxaban , rosuvastatin , spironolactone , topiramate, and traMADol   History Review  Allergy: Mr. Stecklein is allergic to penicillins, amlodipine , amlodipine  besylate, atorvastatin , erenumab-aooe, lisinopril, penicillin g, and simvastatin. Drug: Mr. Paskett  reports no history of drug use. Alcohol:  reports no history of alcohol use. Tobacco:  reports that he quit smoking about 40 years ago. His smoking use included pipe. He has never used smokeless tobacco. Social: Mr. Behrens  reports that he quit smoking about 40 years ago. His smoking use included pipe. He has never used smokeless tobacco. He reports that he does not drink alcohol and does not use drugs. Medical:  has a past medical history of Abnormal ECG (09/27/2013), Chest pain (09/27/2013), Dilated aortic root (09/27/2013), DVT, lower extremity (HCC) (10/27/2011), Dyspnea (06/27/2014), GERD (gastroesophageal reflux disease), Gout, History of DVT (deep vein thrombosis) (04/14/2019), History of pulmonary embolism (04/14/2019), HTN (hypertension) (10/25/2011), Hypercholesteremia, Migraine, OSA (obstructive sleep apnea), and Pre-diabetes. Surgical: Mr. Cart  has a past surgical history that includes Appendectomy; Nasal septum surgery; Tonsillectomy; Testicle surgery; Cardiac catheterization; Back surgery; Cataract extraction w/PHACO (Right, 10/20/2020); and Cataract extraction w/PHACO (Left, 11/03/2020). Family: family history includes Clotting disorder in his brother; Hypertension in his father and  mother.  Laboratory Chemistry Profile   Renal Lab Results  Component Value Date   BUN 15 08/26/2023   CREATININE 1.50 (H) 08/26/2023   BCR 9 (L) 02/25/2021   GFRAA 62 04/15/2019   GFRNONAA 50 (L) 08/26/2023    Hepatic Lab Results  Component Value Date   AST 23 08/26/2023   ALT 22 08/26/2023   ALBUMIN 3.6 08/26/2023   ALKPHOS 51 08/26/2023   LIPASE 19 03/15/2007    Electrolytes Lab Results  Component Value Date   NA 143 08/26/2023   K 4.2 08/26/2023   CL 108 08/26/2023   CALCIUM  9.4 08/26/2023   MG 2.2 04/15/2019    Bone Lab Results  Component Value Date   25OHVITD1 43 04/15/2019   25OHVITD2 <1.0 04/15/2019   25OHVITD3 43 04/15/2019    Inflammation (CRP: Acute Phase) (ESR: Chronic Phase) Lab Results  Component Value Date   CRP 2 04/15/2019   ESRSEDRATE 6 04/15/2019   LATICACIDVEN 1.5 08/26/2023         Note: Above Lab results reviewed.  Recent Imaging Review  MR ANGIO ABDOMEN W WO CONTRAST CLINICAL DATA:  Right interpolar renal lesion.  EXAM: MRA ABDOMEN AND PELVIS WITH CONTRAST  TECHNIQUE: Multiplanar, multiecho pulse sequences of the abdomen and pelvis were obtained with intravenous contrast. Angiographic images of abdomen and pelvis were obtained using MRA technique with intravenous contrast.  CONTRAST:  10 mL Vueway   COMPARISON:  CT of the chest, abdomen and pelvis 08/26/2023  FINDINGS: MRA ABDOMEN FINDINGS  Aorta: Normal in caliber.  No evidence of aneurysm or dissection.  Celiac artery: Widely patent.  No aneurysm, dissection or stenosis.  SMA: Widely patent.  No aneurysm, dissection or stenosis.  Renal arteries: Single renal arteries bilaterally which are widely patent. No aneurysm, dissection or changes of the of the.  IMA: Widely patent and unremarkable.  Inflow: Tortuous iliac system without significant stenosis, occlusion, dissection or stenosis.  MRI ABDOMEN/PELVIS FINDINGS  Hepatobiliary: Normal hepatic contour morphology.  No discrete hepatic lesion. The gallbladder is unremarkable. No biliary ductal dilatation.  Pancreas: Circumscribed 1.9 x 1.6 cm T2  hyperintense, T1 hypointense nonenhancing cystic lesion within the pancreatic head demonstrates minimal lobulations. 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  Kidneys: Circumscribed T2 hyperintense cyst partially exophytic from the interpolar right kidney measures up to 1.1 cm. No evidence of enhancement following contrast administration. Incompletely evaluated given MR arteriogram imaging. However, the absence of any enhancement on arterial phase imaging is highly suggestive of a benign lesion.  Other: No other focal signal abnormality or abnormal enhancement.  IMPRESSION: 1. No evidence of arterial aneurysm, dissection, stenosis or other acute abnormality. 2. No evidence of enhancement of the cystic lesion exophytic from the interpolar right kidney. Findings are most consistent with a simple cyst. No further follow-up imaging is recommended. 3. Nonenhancing cystic lesion is again noted in the head of the pancreas. There appears to be communication with the pancreatic duct. 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  Electronically Signed   By: Wilkie Lent M.D.   On: 10/08/2023 11:41 Note: Reviewed        Physical Exam  Vitals: There were no vitals taken for this visit. BMI: Estimated body mass index is 35.57 kg/m as calculated from the following:   Height as of 08/26/23: 5' 11 (1.803 m).   Weight as of 08/26/23: 255 lb (115.7 kg). Ideal: Patient weight not recorded General appearance: Well nourished, well developed, and well hydrated. In no apparent acute distress Mental status: Alert, oriented x 3 (person, place, & time)       Respiratory: No evidence of acute respiratory distress Eyes: PERLA   Assessment   Diagnosis Status  No diagnosis  found. Controlled Controlled Controlled   Updated Problems: No problems updated.  Plan of Care  Problem-specific:  Assessment and Plan            Mr. Takeem Krotzer has a current medication list which includes the following long-term medication(s): allopurinol , amlodipine , candesartan, chlorthalidone, famotidine , fexofenadine, furosemide, potassium chloride , rosuvastatin , spironolactone , and topiramate.  Pharmacotherapy (Medications Ordered): No orders of the defined types were placed in this encounter.  Orders:  No orders of the defined types were placed in this encounter.    Interventional Therapies  Risk factors  Considerations:   NOTE: Xarelto  Anticoagulation (Stop: 3 days  Restart: 6 hours)  HTN  CKD     Planned  Pending:   Therapeutic right IA hip #4 + right trochanteric bursa injection #2   Under consideration:   Therapeutic left lumbar facet RFA #3 Possible right opturator + femoral NB  Possible right opturator + femoral nerve RFA    Completed:   Therapeutic right IA hip joint inj. x3 (04/27/2023) (100/100/100/90) Therapeutic right trochanteric bursa inj. x1 (04/27/2023) (100/100/100/90) Diagnostic bilateral SI joint Blk x2 (03/10/2020) (50/50/100 x 5 wks/0)  Diagnostic/therapeutic right SI joint Blk x3 (12/15/2022) (100/100/0/0)  Diagnostic bilateral lumbar facet MBB x1 (05/07/2020) (100/100/100 x1 week/>75)  Therapeutic left lumbar facet RFA x3 (06/16/2022) (100/100/100/100)  Therapeutic right lumbar facet RFA x3 (05/24/2022) (100/100/80/80) (by 06/16/2022 it had progressed to 100% ongoing relief)   Palliative options:   Palliative right IA hip joint inj.  Palliative bilateral SI joint Blk  Therapeutic/palliative lumbar facet RFA      No follow-ups on file.    Recent Visits No visits were found meeting these conditions. Showing recent visits within past 90 days and meeting all other requirements Future Appointments Date Type Provider Dept   02/14/24 Appointment Tanya Glisson, MD [facility]  Showing future appointments within next 90 days and meeting all other requirements  I discussed the assessment and treatment plan with the patient. The patient was provided an opportunity to ask questions and all were answered. The patient agreed with the plan and demonstrated an understanding of the instructions.  Patient advised to call back or seek an in-person evaluation if the symptoms or condition worsens.  Duration of encounter: *** minutes.  Total time on encounter, as per AMA guidelines included both the face-to-face and non-face-to-face time personally spent by the physician and/or other qualified health care professional(s) on the day of the encounter (includes time in activities that require the physician or other qualified health care professional and does not include time in activities normally performed by clinical staff). Physician's time may include the following activities when performed: Preparing to see the patient (e.g., pre-charting review of records, searching for previously ordered imaging, lab work, and nerve conduction tests) Review of prior analgesic pharmacotherapies. Reviewing PMP Interpreting ordered tests (e.g., lab work, imaging, nerve conduction tests) Performing post-procedure evaluations, including interpretation of diagnostic procedures Obtaining and/or reviewing separately obtained history Performing a medically appropriate examination and/or evaluation Counseling and educating the patient/family/caregiver Ordering medications, tests, or procedures Referring and communicating with other health care professionals (when not separately reported) Documenting clinical information in the electronic or other health record Independently interpreting results (not separately reported) and communicating results to the patient/ family/caregiver Care coordination (not separately reported)  Note by:  Eric DELENA Como, MD (TTS and AI technology used. I apologize for any typographical errors that were not detected and corrected.) Date: 02/14/2024; Time: 11:24 AM

## 2024-02-14 ENCOUNTER — Ambulatory Visit: Attending: Pain Medicine | Admitting: Pain Medicine

## 2024-02-14 ENCOUNTER — Encounter: Payer: Self-pay | Admitting: Pain Medicine

## 2024-02-14 VITALS — BP 146/100 | HR 63 | Temp 96.8°F | Ht 70.0 in | Wt 230.0 lb

## 2024-02-14 DIAGNOSIS — M25579 Pain in unspecified ankle and joints of unspecified foot: Secondary | ICD-10-CM | POA: Insufficient documentation

## 2024-02-14 DIAGNOSIS — M4316 Spondylolisthesis, lumbar region: Secondary | ICD-10-CM | POA: Diagnosis not present

## 2024-02-14 DIAGNOSIS — M545 Low back pain, unspecified: Secondary | ICD-10-CM | POA: Diagnosis not present

## 2024-02-14 DIAGNOSIS — K862 Cyst of pancreas: Secondary | ICD-10-CM | POA: Insufficient documentation

## 2024-02-14 DIAGNOSIS — G8929 Other chronic pain: Secondary | ICD-10-CM | POA: Diagnosis not present

## 2024-02-14 DIAGNOSIS — M47816 Spondylosis without myelopathy or radiculopathy, lumbar region: Secondary | ICD-10-CM | POA: Insufficient documentation

## 2024-02-14 DIAGNOSIS — M5459 Other low back pain: Secondary | ICD-10-CM | POA: Insufficient documentation

## 2024-02-14 DIAGNOSIS — Z7901 Long term (current) use of anticoagulants: Secondary | ICD-10-CM | POA: Diagnosis not present

## 2024-02-14 DIAGNOSIS — D136 Benign neoplasm of pancreas: Secondary | ICD-10-CM | POA: Insufficient documentation

## 2024-02-14 DIAGNOSIS — N183 Chronic kidney disease, stage 3 unspecified: Secondary | ICD-10-CM | POA: Insufficient documentation

## 2024-02-14 DIAGNOSIS — M47817 Spondylosis without myelopathy or radiculopathy, lumbosacral region: Secondary | ICD-10-CM | POA: Insufficient documentation

## 2024-02-14 DIAGNOSIS — I7121 Aneurysm of the ascending aorta, without rupture: Secondary | ICD-10-CM | POA: Insufficient documentation

## 2024-02-14 NOTE — Patient Instructions (Signed)
 ______________________________________________________________________    Blood Thinners  IMPORTANT NOTICE:  If you take any of these, make sure to notify the nursing staff.  Failure to do so may result in serious injury.  Recommended time intervals to stop and restart blood-thinners, before & after invasive procedures  Generic Name Brand Name Pre-procedure: Stop medication for this amount of time before your procedure: Post-procedure: Wait this amount of time after the procedure before restarting your medication:  Abciximab Reopro 15 days 2 hrs  Alteplase Activase 10 days 10 days  Anagrelide Agrylin    Apixaban Eliquis 3 days 6 hrs  Cilostazol Pletal 3 days 5 hrs  Clopidogrel Plavix 7-10 days 2 hrs  Dabigatran Pradaxa 5 days 6 hrs  Dalteparin Fragmin 24 hours 4 hrs  Dipyridamole Aggrenox 11days 2 hrs  Edoxaban Lixiana; Savaysa 3 days 2 hrs  Enoxaparin  Lovenox 24 hours 4 hrs  Eptifibatide Integrillin 8 hours 2 hrs  Fondaparinux  Arixtra 72 hours 12 hrs  Hydroxychloroquine Plaquenil 11 days   Prasugrel Effient 7-10 days 6 hrs  Reteplase Retavase 10 days 10 days  Rivaroxaban  Xarelto  3 days 6 hrs  Ticagrelor Brilinta 5-7 days 6 hrs  Ticlopidine Ticlid 10-14 days 2 hrs  Tinzaparin Innohep 24 hours 4 hrs  Tirofiban Aggrastat 8 hours 2 hrs  Warfarin Coumadin  5 days 2 hrs   Other medications with blood-thinning effects  NOTE: Consider stopping these if you have prolonged bleeding despite not taking any of the above blood thinners. Otherwise ask your provider and this will be decided on a case-by-case basis.  Product indications Generic (Brand) names Note  Cholesterol Lipitor Stop 4 days before procedure  Blood thinner (injectable) Heparin  (LMW or LMWH Heparin ) Stop 24 hours before procedure  Cancer Ibrutinib (Imbruvica) Stop 7 days before procedure  Malaria/Rheumatoid Hydroxychloroquine (Plaquenil) Stop 11 days before procedure  Thrombolytics  10 days before or after procedures    Over-the-counter (OTC) Products with blood-thinning effects  NOTE: Consider stopping these if you have prolonged bleeding despite not taking any of the above blood thinners. Otherwise ask your provider and this will be decided on a case-by-case basis.  Product Common names Stop Time  Aspirin > 325 mg Goody Powders, Excedrin, etc. 11 days  Aspirin <= 81 mg  7 days  Fish oil  4 days  Garlic supplements  7 days  Ginkgo biloba  36 hours  Ginseng  24 hours  NSAIDs Ibuprofen, Naprosyn , etc. 3 days  Vitamin E  4 days   ______________________________________________________________________     ______________________________________________________________________    Procedure instructions  Stop blood-thinners  Do not eat or drink fluids (other than water) for 6 hours before your procedure  No water for 2 hours before your procedure  Take your blood pressure medicine with a sip of water  Arrive 30 minutes before your appointment  If sedation is planned, bring suitable driver. [place], [place], & public transportation are NOT APPROVED)  Carefully read the Preparing for your procedure detailed instructions  If you have questions call us  at [phone]  Procedure appointments are for procedures only.   NO medication refills or new problem evaluations will be done on procedure days.   Only the scheduled, pre-approved procedure and side will be done.   ______________________________________________________________________     ______________________________________________________________________    Preparing for your procedure  Appointments: If you think you may not be able to keep your appointment, call 24-48 hours in advance to cancel. We need time to make it available to others.  Procedure visits are for procedures only. During your procedure appointment there will be: NO Prescription Refills*. NO medication changes or discussions*. NO discussion of disability  issues*. NO unrelated pain problem evaluations*. NO evaluations to order other pain procedures*. *These will be addressed at a separate and distinct evaluation encounter on the provider's evaluation schedule and not during procedure days.  Instructions: Food intake: Avoid eating anything solid for at least 8 hours prior to your procedure. Clear liquid intake: You may take clear liquids such as water up to 2 hours prior to your procedure. (No carbonated drinks. No soda.) Transportation: Unless otherwise stated by your physician, bring a driver. (Driver cannot be a Market researcher, Pharmacist, community, or any other form of public transportation.) Morning Medicines: Except for blood thinners, take all of your other morning medications with a sip of water. Make sure to take your heart and blood pressure medicines. If your blood pressure's lower number is above 100, the case will be rescheduled. Blood thinners: Make sure to stop your blood thinners as instructed.  If you take a blood thinner, but were not instructed to stop it, call our office [phone] and ask to talk to a nurse. Not stopping a blood thinner prior to certain procedures could lead to serious complications. Diabetics on insulin: Notify the staff so that you can be scheduled 1st case in the morning. If your diabetes requires high dose insulin, take only  of your normal insulin dose the morning of the procedure and notify the staff that you have done so. Preventing infections: Shower with an antibacterial soap the morning of your procedure.  Build-up your immune system: Take 1000 mg of Vitamin C with every meal (3 times a day) the day prior to your procedure. Antibiotics: Inform the nursing staff if you are taking any antibiotics or if you have any conditions that may require antibiotics prior to procedures. (Example: recent joint implants)   Pregnancy: If you are pregnant make sure to notify the nursing staff. Not doing so may result in injury to the fetus,  including death.  Sickness: If you have a cold, fever, or any active infections, call and cancel or reschedule your procedure. Receiving steroids while having an infection may result in complications. Arrival: You must be in the facility at least 30 minutes prior to your scheduled procedure. Tardiness: Your scheduled time is also the cutoff time. If you do not arrive at least 15 minutes prior to your procedure, you will be rescheduled.  Children: Do not bring any children with you. Make arrangements to keep them home. Dress appropriately: There is always a possibility that your clothing may get soiled. Avoid long dresses. Valuables: Do not bring any jewelry or valuables.  Reasons to call and reschedule or cancel your procedure: (Following these recommendations will minimize the risk of a serious complication.) Surgeries: Avoid having procedures within 2 weeks of any surgery. (Avoid for 2 weeks before or after any surgery). Flu Shots: Avoid having procedures within 2 weeks of a flu shots or . (Avoid for 2 weeks before or after immunizations). Barium: Avoid having a procedure within 7-10 days after having had a radiological study involving the use of radiological contrast. (Myelograms, Barium swallow or enema study). Heart attacks: Avoid any elective procedures or surgeries for the initial 6 months after a Myocardial Infarction (Heart Attack). Blood thinners: It is imperative that you stop these medications before procedures. Let us  know if you if you take any blood thinner.  Infection: Avoid procedures during or  within two weeks of an infection (including chest colds or gastrointestinal problems). Symptoms associated with infections include: Localized redness, fever, chills, night sweats or profuse sweating, burning sensation when voiding, cough, congestion, stuffiness, runny nose, sore throat, diarrhea, nausea, vomiting, cold or Flu symptoms, recent or current infections. It is specially important if  the infection is over the area that we intend to treat. Heart and lung problems: Symptoms that may suggest an active cardiopulmonary problem include: cough, chest pain, breathing difficulties or shortness of breath, dizziness, ankle swelling, uncontrolled high or unusually low blood pressure, and/or palpitations. If you are experiencing any of these symptoms, cancel your procedure and contact your primary care physician for an evaluation.  Remember:  Regular Business hours are:  Monday to Thursday 8:00 AM to 4:00 PM  Provider's Schedule: Eric Como, MD:  Procedure days: Tuesday and Thursday 7:30 AM to 4:00 PM  Wallie Sherry, MD:  Procedure days: Monday and Wednesday 7:30 AM to 4:00 PM Last  Updated: 05/02/2023 ______________________________________________________________________     ______________________________________________________________________    General Risks and Possible Complications  Patient Responsibilities: It is important that you read this as it is part of your informed consent. It is our duty to inform you of the risks and possible complications associated with treatments offered to you. It is your responsibility as a patient to read this and to ask questions about anything that is not clear or that you believe was not covered in this document.  Patient's Rights: You have the right to refuse treatment. You also have the right to change your mind, even after initially having agreed to have the treatment done. However, under this last option, if you wait until the last second to change your mind, you may be charged for the materials used up to that point.  Introduction: Medicine is not an Visual merchandiser. Everything in Medicine, including the lack of treatment(s), carries the potential for danger, harm, or loss (which is by definition: Risk). In Medicine, a complication is a secondary problem, condition, or disease that can aggravate an already existing one. All  treatments carry the risk of possible complications. The fact that a side effects or complications occurs, does not imply that the treatment was conducted incorrectly. It must be clearly understood that these can happen even when everything is done following the highest safety standards.  No treatment: You can choose not to proceed with the proposed treatment alternative. The "PRO(s)" would include: avoiding the risk of complications associated with the therapy. The "CON(s)" would include: not getting any of the treatment benefits. These benefits fall under one of three categories: diagnostic; therapeutic; and/or palliative. Diagnostic benefits include: getting information which can ultimately lead to improvement of the disease or symptom(s). Therapeutic benefits are those associated with the successful treatment of the disease. Finally, palliative benefits are those related to the decrease of the primary symptoms, without necessarily curing the condition (example: decreasing the pain from a flare-up of a chronic condition, such as incurable terminal cancer).  General Risks and Complications: These are associated to most interventional treatments. They can occur alone, or in combination. They fall under one of the following six (6) categories: no benefit or worsening of symptoms; bleeding; infection; nerve damage; allergic reactions; and/or death. No benefits or worsening of symptoms: In Medicine there are no guarantees, only probabilities. No healthcare provider can ever guarantee that a medical treatment will work, they can only state the probability that it may. Furthermore, there is always the possibility that the  condition may worsen, either directly, or indirectly, as a consequence of the treatment. Bleeding: This is more common if the patient is taking a blood thinner, either prescription or over the counter (example: Goody Powders, Fish oil, Aspirin, Garlic, etc.), or if suffering a condition  associated with impaired coagulation (example: Hemophilia, cirrhosis of the liver, low platelet counts, etc.). However, even if you do not have one on these, it can still happen. If you have any of these conditions, or take one of these drugs, make sure to notify your treating physician. Infection: This is more common in patients with a compromised immune system, either due to disease (example: diabetes, cancer, human immunodeficiency virus [HIV], etc.), or due to medications or treatments (example: therapies used to treat cancer and rheumatological diseases). However, even if you do not have one on these, it can still happen. If you have any of these conditions, or take one of these drugs, make sure to notify your treating physician. Nerve Damage: This is more common when the treatment is an invasive one, but it can also happen with the use of medications, such as those used in the treatment of cancer. The damage can occur to small secondary nerves, or to large primary ones, such as those in the spinal cord and brain. This damage may be temporary or permanent and it may lead to impairments that can range from temporary numbness to permanent paralysis and/or brain death. Allergic Reactions: Any time a substance or material comes in contact with our body, there is the possibility of an allergic reaction. These can range from a mild skin rash (contact dermatitis) to a severe systemic reaction (anaphylactic reaction), which can result in death. Death: In general, any medical intervention can result in death, most of the time due to an unforeseen complication. ______________________________________________________________________      ______________________________________________________________________    Steroid injections  Common steroids for injections Triamcinolone : Used by many sports medicine physicians for large joint and bursal injections, often combined with a local anesthetic like lidocaine . A  study focusing on coccydynia (tailbone pain) found triamcinolone  was more effective than betamethasone, suggesting it may also be preferable for other localized inflammation conditions. Methylprednisolone : A common alternative to triamcinolone  that is also a strong anti-inflammatory. It is available in different formulations, with the acetate suspension being the long-acting option for intra-articular injections. Dexamethasone: This is a non-particulate steroid, meaning it has a lower risk of tissue damage compared to particulate steroids like triamcinolone  and methylprednisolone . While less common for this specific use, it is an option for targeted injections.   Considerations for physicians Particulate vs. non-particulate steroids: Triamcinolone  and methylprednisolone  are particulate, meaning they can clump together. Dexamethasone is non-particulate. Particulate steroids are often preferred for their longer-lasting effects but carry a theoretical higher risk for certain injections (though this is less of a concern in the costochondral joints). Combined injectate: Corticosteroids are typically mixed with a local anesthetic like lidocaine  to provide both immediate pain relief (from the anesthetic) and longer-term inflammation reduction (from the steroid). Imaging guidance: To ensure accurate placement of the needle and medication, physicians may use ultrasound or fluoroscopic guidance for the injection, especially in complex or refractory cases.   Patient guidance Before undergoing a steroid injection, discuss the options with your physician. They will determine the best steroid, dosage, and procedure for your specific case based on factors like: Severity of your condition History of response to other treatments Your overall health status Experience and preference of the physician  Last  Updated: 01/16/2024 ______________________________________________________________________

## 2024-02-14 NOTE — Progress Notes (Signed)
 Safety precautions to be maintained throughout the outpatient stay will include: orient to surroundings, keep bed in low position, maintain call bell within reach at all times, provide assistance with transfer out of bed and ambulation.

## 2024-02-27 DIAGNOSIS — I2699 Other pulmonary embolism without acute cor pulmonale: Secondary | ICD-10-CM | POA: Diagnosis not present

## 2024-02-27 DIAGNOSIS — E1122 Type 2 diabetes mellitus with diabetic chronic kidney disease: Secondary | ICD-10-CM | POA: Diagnosis not present

## 2024-02-27 DIAGNOSIS — N1832 Chronic kidney disease, stage 3b: Secondary | ICD-10-CM | POA: Diagnosis not present

## 2024-02-27 DIAGNOSIS — D352 Benign neoplasm of pituitary gland: Secondary | ICD-10-CM | POA: Diagnosis not present

## 2024-02-27 DIAGNOSIS — I129 Hypertensive chronic kidney disease with stage 1 through stage 4 chronic kidney disease, or unspecified chronic kidney disease: Secondary | ICD-10-CM | POA: Diagnosis not present

## 2024-02-27 DIAGNOSIS — D490 Neoplasm of unspecified behavior of digestive system: Secondary | ICD-10-CM | POA: Diagnosis not present

## 2024-02-27 DIAGNOSIS — E785 Hyperlipidemia, unspecified: Secondary | ICD-10-CM | POA: Diagnosis not present

## 2024-03-07 ENCOUNTER — Other Ambulatory Visit: Payer: Self-pay | Admitting: Internal Medicine

## 2024-03-07 DIAGNOSIS — D352 Benign neoplasm of pituitary gland: Secondary | ICD-10-CM

## 2024-03-14 ENCOUNTER — Encounter: Payer: Self-pay | Admitting: Pain Medicine

## 2024-03-14 ENCOUNTER — Ambulatory Visit: Admitting: Pain Medicine

## 2024-03-14 ENCOUNTER — Ambulatory Visit
Admission: RE | Admit: 2024-03-14 | Discharge: 2024-03-14 | Disposition: A | Discharge status: Home / Self Care | Source: Ambulatory Visit | Attending: Pain Medicine | Admitting: Pain Medicine

## 2024-03-14 VITALS — BP 121/80 | HR 62 | Temp 97.3°F | Resp 14 | Ht 70.0 in | Wt 232.0 lb

## 2024-03-14 DIAGNOSIS — Z7901 Long term (current) use of anticoagulants: Secondary | ICD-10-CM | POA: Diagnosis present

## 2024-03-14 DIAGNOSIS — M545 Low back pain, unspecified: Secondary | ICD-10-CM | POA: Diagnosis present

## 2024-03-14 DIAGNOSIS — G8929 Other chronic pain: Secondary | ICD-10-CM | POA: Insufficient documentation

## 2024-03-14 DIAGNOSIS — M5459 Other low back pain: Secondary | ICD-10-CM

## 2024-03-14 DIAGNOSIS — R933 Abnormal findings on diagnostic imaging of other parts of digestive tract: Secondary | ICD-10-CM | POA: Insufficient documentation

## 2024-03-14 DIAGNOSIS — M1A0790 Idiopathic chronic gout, unspecified ankle and foot, without tophus (tophi): Secondary | ICD-10-CM | POA: Insufficient documentation

## 2024-03-14 DIAGNOSIS — E1122 Type 2 diabetes mellitus with diabetic chronic kidney disease: Secondary | ICD-10-CM | POA: Insufficient documentation

## 2024-03-14 DIAGNOSIS — M4316 Spondylolisthesis, lumbar region: Secondary | ICD-10-CM | POA: Diagnosis present

## 2024-03-14 DIAGNOSIS — G720 Drug-induced myopathy: Secondary | ICD-10-CM | POA: Insufficient documentation

## 2024-03-14 DIAGNOSIS — M47816 Spondylosis without myelopathy or radiculopathy, lumbar region: Secondary | ICD-10-CM

## 2024-03-14 DIAGNOSIS — G8918 Other acute postprocedural pain: Secondary | ICD-10-CM | POA: Insufficient documentation

## 2024-03-14 DIAGNOSIS — G43009 Migraine without aura, not intractable, without status migrainosus: Secondary | ICD-10-CM | POA: Insufficient documentation

## 2024-03-14 MED ORDER — LIDOCAINE HCL 2 % IJ SOLN
INTRAMUSCULAR | Status: AC
  Filled 2024-03-14: qty 20

## 2024-03-14 MED ORDER — TRIAMCINOLONE ACETONIDE 40 MG/ML IJ SUSP
INTRAMUSCULAR | Status: AC
  Filled 2024-03-14: qty 1

## 2024-03-14 MED ORDER — ROPIVACAINE HCL 2 MG/ML IJ SOLN
9.0000 mL | Freq: Once | INTRAMUSCULAR | Status: AC
  Administered 2024-03-14: 9 mL via PERINEURAL

## 2024-03-14 MED ORDER — LIDOCAINE HCL 2 % IJ SOLN
20.0000 mL | Freq: Once | INTRAMUSCULAR | Status: AC
  Administered 2024-03-14: 400 mg

## 2024-03-14 MED ORDER — ROPIVACAINE HCL 2 MG/ML IJ SOLN
INTRAMUSCULAR | Status: AC
  Filled 2024-03-14: qty 20

## 2024-03-14 MED ORDER — FENTANYL CITRATE (PF) 100 MCG/2ML IJ SOLN
25.0000 ug | INTRAMUSCULAR | Status: DC | PRN
  Administered 2024-03-14: 50 ug via INTRAVENOUS

## 2024-03-14 MED ORDER — TRIAMCINOLONE ACETONIDE 40 MG/ML IJ SUSP
40.0000 mg | Freq: Once | INTRAMUSCULAR | Status: AC
  Administered 2024-03-14: 40 mg

## 2024-03-14 MED ORDER — HYDROCODONE-ACETAMINOPHEN 5-325 MG PO TABS: 1.0000 | ORAL_TABLET | Freq: Three times a day (TID) | ORAL | 0 refills | Status: AC | PRN

## 2024-03-14 MED ORDER — MIDAZOLAM HCL 5 MG/5ML IJ SOLN
INTRAMUSCULAR | Status: AC
  Filled 2024-03-14: qty 5

## 2024-03-14 MED ORDER — FENTANYL CITRATE (PF) 100 MCG/2ML IJ SOLN
INTRAMUSCULAR | Status: AC
  Filled 2024-03-14: qty 2

## 2024-03-14 MED ORDER — MIDAZOLAM HCL 5 MG/5ML IJ SOLN
0.5000 mg | Freq: Once | INTRAMUSCULAR | Status: AC
  Administered 2024-03-14: 2 mg via INTRAVENOUS

## 2024-03-14 MED ORDER — PENTAFLUOROPROP-TETRAFLUOROETH EX AERO
INHALATION_SPRAY | Freq: Once | CUTANEOUS | Status: AC
  Administered 2024-03-14: 30 via TOPICAL

## 2024-03-14 NOTE — Progress Notes (Signed)
 PROVIDER NOTE: Interpretation of information contained herein should be left to medically-trained personnel. Specific patient instructions are provided elsewhere under Patient Instructions section of medical record. This document was created in part using STT-dictation technology, any transcriptional errors that may result from this process are unintentional.  Patient: [name] Whitaker Type: Established DOB: [birth year] MRN: [id number] PCP: [facility]  Service: Procedure DOS: 03/14/2024 Setting: Ambulatory Location: Ambulatory outpatient facility Delivery: Face-to-face Provider: Eric DELENA Como, MD Specialty: Interventional Pain Management Specialty designation: 09 Location: Outpatient facility Ref. Prov.: [facility]       Interventional Therapy   Procedure: Lumbar Facet, Medial Branch Radiofrequency Ablation (RFA) #4  Laterality: Right (-RT)  Level: L2, L3, L4, L5, and S1 Medial Branch Level(s). These levels will denervate the L3-4, L4-5, and L5-S1 lumbar facet joints.  Imaging: Fluoroscopy-guided Spinal ([id number]) Anesthesia: Local anesthesia (1-2% Lidocaine ) Anxiolysis: IV Versed  2.0 mg Sedation: Minimal Sedation Fentanyl  1 mL (50 mcg) DOS: 03/14/2024  Performed by: Eric DELENA Como, MD  Purpose: Therapeutic/Palliative Indications: Low back pain severe enough to impact quality of life or function. Indications: 1. Chronic low back pain (1ry area of Pain) (Bilateral) (R>L) w/o sciatica   2. Grade 1 Anterolisthesis (2mm) of lumbar spine of L4/L5   3. Lumbar facet joint pain   4. Lumbar facet hypertrophy (Multilevel)    5. Lumbar facet joint syndrome (Bilateral) (R>L)   6. Low back pain of over 3 months duration   7. Spondylosis of lumbar region without myelopathy or radiculopathy   8. Chronic anticoagulation (Xarelto )   9. Acute postoperative pain    [name] Whitaker has been dealing with the above chronic pain for longer than three months and has  either failed to respond, was unable to tolerate, or simply did not get enough benefit from other more conservative therapies including, but not limited to: 1. Over-the-counter medications 2. Anti-inflammatory medications 3. Muscle relaxants 4. Membrane stabilizers 5. Opioids 6. Physical therapy and/or chiropractic manipulation 7. Modalities (Heat, ice, etc.) 8. Invasive techniques such as nerve blocks. [name] Whitaker has attained more than 50% relief of the pain from a series of diagnostic injections conducted in separate occasions.  Pain Score: Pre-procedure: 7 /10 Post-procedure: 0-No pain/10     Position / Prep / Materials:  Position: Prone  Prep solution: ChloraPrep (2% chlorhexidine gluconate and 70% isopropyl alcohol) Prep Area: Entire Lumbosacral Region (Lower back from mid-thoracic region to end of tailbone and from flank to flank.) Materials:  Tray: RFA (Radiofrequency) tray Needle(s):  Type: RFA (Teflon-coated radiofrequency ablation needles) Gauge (G): 22  Length: Regular (10cm) Qty: 5     H&P (Pre-op Assessment):  [name] Whitaker is a 73 y.o. (year old), male patient, seen today for interventional treatment. He  has a past surgical history that includes Appendectomy; Nasal septum surgery; Tonsillectomy; Testicle surgery; Cardiac catheterization; Back surgery; Cataract extraction w/PHACO (Right, 10/20/2020); and Cataract extraction w/PHACO (Left, 11/03/2020). [name] Whitaker has a current medication list which includes the following prescription(s): acetaminophen , allopurinol , amlodipine , ascorbic acid, omron 3 series bp monitor, candesartan, chlorthalidone, cholecalciferol, cyanocobalamin , cyclobenzaprine , diclofenac  sodium, dulaglutide, epinephrine , famotidine , fexofenadine, finasteride , fluticasone, gabapentin , hydrocodone -acetaminophen , [START ON 03/21/2024] hydrocodone -acetaminophen , hydrocortisone, ketoconazole, lidocaine , potassium chloride , propranolol , rivaroxaban ,  rosuvastatin , spironolactone , topiramate, and tramadol , and the following Facility-Administered Medications: fentanyl . His primarily concern today is the Back Pain (Right, lower)  Initial Vital Signs:  Pulse/HCG Rate: 62ECG Heart Rate: 66 (nsr) Temp: 97.8 F (36.6 C) Resp: 18 BP: (!) 142/95 SpO2: 99 %  BMI: Estimated body mass  index is 33.29 kg/m as calculated from the following:   Height as of this encounter: 5' 10 (1.778 m).   Weight as of this encounter: 232 lb (105.2 kg).  Risk Assessment: Allergies: Reviewed. He is allergic to penicillins, amlodipine , atorvastatin , erenumab-aooe, lisinopril, penicillin g, and simvastatin.  Allergy Precautions: None required Coagulopathies: Reviewed. None identified.  Blood-thinner therapy: None at this time Active Infection(s): Reviewed. None identified. [name] Whitaker is afebrile  Site Confirmation: [name] Whitaker was asked to confirm the procedure and laterality before marking the site Procedure checklist: Completed Consent: Before the procedure and under the influence of no sedative(s), amnesic(s), or anxiolytics, the patient was informed of the treatment options, risks and possible complications. To fulfill our ethical and legal obligations, as recommended by the American Medical Association's Code of Ethics, I have informed the patient of my clinical impression; the nature and purpose of the treatment or procedure; the risks, benefits, and possible complications of the intervention; the alternatives, including doing nothing; the risk(s) and benefit(s) of the alternative treatment(s) or procedure(s); and the risk(s) and benefit(s) of doing nothing. The patient was provided information about the general risks and possible complications associated with the procedure. These may include, but are not limited to: failure to achieve desired goals, infection, bleeding, organ or nerve damage, allergic reactions, paralysis, and death. In addition, the patient  was informed of those risks and complications associated to Spine-related procedures, such as failure to decrease pain; infection (i.e.: Meningitis, epidural or intraspinal abscess); bleeding (i.e.: epidural hematoma, subarachnoid hemorrhage, or any other type of intraspinal or peri-dural bleeding); organ or nerve damage (i.e.: Any type of peripheral nerve, nerve root, or spinal cord injury) with subsequent damage to sensory, motor, and/or autonomic systems, resulting in permanent pain, numbness, and/or weakness of one or several areas of the body; allergic reactions; (i.e.: anaphylactic reaction); and/or death. Furthermore, the patient was informed of those risks and complications associated with the medications. These include, but are not limited to: allergic reactions (i.e.: anaphylactic or anaphylactoid reaction(s)); adrenal axis suppression; blood sugar elevation that in diabetics may result in ketoacidosis or comma; water retention that in patients with history of congestive heart failure may result in shortness of breath, pulmonary edema, and decompensation with resultant heart failure; weight gain; swelling or edema; medication-induced neural toxicity; particulate matter embolism and blood vessel occlusion with resultant organ, and/or nervous system infarction; and/or aseptic necrosis of one or more joints. Finally, the patient was informed that Medicine is not an exact science; therefore, there is also the possibility of unforeseen or unpredictable risks and/or possible complications that may result in a catastrophic outcome. The patient indicated having understood very clearly. We have given the patient no guarantees and we have made no promises. Enough time was given to the patient to ask questions, all of which were answered to the patient's satisfaction. [name] Whitaker has indicated that he wanted to continue with the procedure. Attestation: I, the ordering provider, attest that I have discussed with  the patient the benefits, risks, side-effects, alternatives, likelihood of achieving goals, and potential problems during recovery for the procedure that I have provided informed consent. Date  Time: 03/14/2024  9:51 AM  Pre-Procedure Preparation:  Monitoring: As per clinic protocol. Respiration, ETCO2, SpO2, BP, heart rate and rhythm monitor placed and checked for adequate function Safety Precautions: Patient was assessed for positional comfort and pressure points before starting the procedure. Time-out: I initiated and conducted the Time-out before starting the procedure, as per protocol. The patient was  asked to participate by confirming the accuracy of the Time Out information. Verification of the correct person, site, and procedure were performed and confirmed by me, the nursing staff, and the patient. Time-out conducted as per Joint Commission's Universal Protocol (UP.01.01.01). Time: 1026 Start Time: 1026 hrs.  Description of Procedure:          Laterality: See above. Levels:  See above. Safety Precautions: Aspiration looking for blood return was conducted prior to all injections. At no point did we inject any substances, as a needle was being advanced. Before injecting, the patient was told to immediately notify me if he was experiencing any new onset of ringing in the ears, or metallic taste in the mouth. No attempts were made at seeking any paresthesias. Safe injection practices and needle disposal techniques used. Medications properly checked for expiration dates. SDV (single dose vial) medications used. After the completion of the procedure, all disposable equipment used was discarded in the proper designated medical waste containers. Local Anesthesia: Protocol guidelines were followed. The patient was positioned over the fluoroscopy table. The area was prepped in the usual manner. The time-out was completed. The target area was identified using fluoroscopy. A 12-in long, straight,  sterile hemostat was used with fluoroscopic guidance to locate the targets for each level blocked. Once located, the skin was marked with an approved surgical skin marker. Once all sites were marked, the skin (epidermis, dermis, and hypodermis), as well as deeper tissues (fat, connective tissue and muscle) were infiltrated with a small amount of a short-acting local anesthetic, loaded on a 10cc syringe with a 25G, 1.5-in  Needle. An appropriate amount of time was allowed for local anesthetics to take effect before proceeding to the next step. Technical description of process:  Radiofrequency Ablation (RFA) L2 Medial Branch Nerve RFA: The target area for the L2 medial branch is at the junction of the postero-lateral aspect of the superior articular process and the superior, posterior, and medial edge of the transverse process of L3. Under fluoroscopic guidance, a Radiofrequency needle was inserted until contact was made with os over the superior postero-lateral aspect of the pedicular shadow (target area). Sensory and motor testing was conducted to properly adjust the position of the needle. Once satisfactory placement of the needle was achieved, the numbing solution was slowly injected after negative aspiration for blood. 2.0 mL of the nerve block solution was injected without difficulty or complication. After waiting for at least 3 minutes, the ablation was performed. Once completed, the needle was removed intact. L3 Medial Branch Nerve RFA: The target area for the L3 medial branch is at the junction of the postero-lateral aspect of the superior articular process and the superior, posterior, and medial edge of the transverse process of L4. Under fluoroscopic guidance, a Radiofrequency needle was inserted until contact was made with os over the superior postero-lateral aspect of the pedicular shadow (target area). Sensory and motor testing was conducted to properly adjust the position of the needle. Once  satisfactory placement of the needle was achieved, the numbing solution was slowly injected after negative aspiration for blood. 2.0 mL of the nerve block solution was injected without difficulty or complication. After waiting for at least 3 minutes, the ablation was performed. Once completed, the needle was removed intact. L4 Medial Branch Nerve RFA: The target area for the L4 medial branch is at the junction of the postero-lateral aspect of the superior articular process and the superior, posterior, and medial edge of the transverse process of L5.  Under fluoroscopic guidance, a Radiofrequency needle was inserted until contact was made with os over the superior postero-lateral aspect of the pedicular shadow (target area). Sensory and motor testing was conducted to properly adjust the position of the needle. Once satisfactory placement of the needle was achieved, the numbing solution was slowly injected after negative aspiration for blood. 2.0 mL of the nerve block solution was injected without difficulty or complication. After waiting for at least 3 minutes, the ablation was performed. Once completed, the needle was removed intact. L5 Medial Branch Nerve RFA: The target area for the L5 medial branch is at the junction of the postero-lateral aspect of the superior articular process of S1 and the superior, posterior, and medial edge of the sacral ala. Under fluoroscopic guidance, a Radiofrequency needle was inserted until contact was made with os over the superior postero-lateral aspect of the pedicular shadow (target area). Sensory and motor testing was conducted to properly adjust the position of the needle. Once satisfactory placement of the needle was achieved, the numbing solution was slowly injected after negative aspiration for blood. 2.0 mL of the nerve block solution was injected without difficulty or complication. After waiting for at least 3 minutes, the ablation was performed. Once completed, the needle  was removed intact. S1 Medial Branch Nerve RFA: The target area for the S1 medial branch is located inferior to the junction of the S1 superior articular process and the L5 inferior articular process, posterior, inferior, and lateral to the 6 o'clock position of the L5-S1 facet joint, just superior to the S1 posterior foramen. Under fluoroscopic guidance, the Radiofrequency needle was advanced until contact was made with os over the Target area. Sensory and motor testing was conducted to properly adjust the position of the needle. Once satisfactory placement of the needle was achieved, the numbing solution was slowly injected after negative aspiration for blood. 2.0 mL of the nerve block solution was injected without difficulty or complication. After waiting for at least 3 minutes, the ablation was performed. Once completed, the needle was removed intact. Radiofrequency lesioning (ablation):  Radiofrequency Generator: Medtronic AccurianTM AG 1000 RF Generator Sensory Stimulation Parameters: 50 Hz was used to locate & identify the nerve, making sure that the needle was positioned such that there was no sensory stimulation below 0.3 V or above 0.7 V. Motor Stimulation Parameters: 2 Hz was used to evaluate the motor component. Care was taken not to lesion any nerves that demonstrated motor stimulation of the lower extremities at an output of less than 2.5 times that of the sensory threshold, or a maximum of 2.0 V. Lesioning Technique Parameters: Standard Radiofrequency settings. (Not bipolar or pulsed.) Temperature Settings: 80 degrees C Lesioning time: 60 seconds Stationary intra-operative compliance: Compliant  Once the entire procedure was completed, the treated area was cleaned, making sure to leave some of the prepping solution back to take advantage of its long term bactericidal properties.    Illustration of the posterior view of the lumbar spine and the posterior neural structures. Laminae of L2  through S1 are labeled. DPRL5, dorsal primary ramus of L5; DPRS1, dorsal primary ramus of S1; DPR3, dorsal primary ramus of L3; FJ, facet (zygapophyseal) joint L3-L4; I, inferior articular process of L4; LB1, lateral branch of dorsal primary ramus of L1; IAB, inferior articular branches from L3 medial branch (supplies L4-L5 facet joint); IBP, intermediate branch plexus; MB3, medial branch of dorsal primary ramus of L3; NR3, third lumbar nerve root; S, superior articular process of L5; SAB, superior  articular branches from L4 (supplies L4-5 facet joint also); TP3, transverse process of L3.  Facet Joint Innervation (* possible contribution)  L1-2 T12, L1 (L2*)  Medial Branch  L2-3 L1, L2 (L3*)                     L3-4 L2, L3 (L4*)                     L4-5 L3, L4 (L5*)                     L5-S1 L4, L5, S1                        Vitals:   03/14/24 1058 03/14/24 1108 03/14/24 1118 03/14/24 1126  BP: (!) 124/94 128/88 118/77 121/80  Pulse:      Resp: 15 14 17 14   Temp:  (!) 97.2 F (36.2 C)  (!) 97.3 F (36.3 C)  TempSrc:  Temporal  Temporal  SpO2: 95% 95% 98% 97%  Weight:      Height:        Start Time: 1026 hrs. End Time: 1058 hrs.  Imaging Guidance (Spinal):         Type of Imaging Technique: Fluoroscopy Guidance (Spinal) Indication(s): Fluoroscopy guidance for needle placement to enhance accuracy in procedures requiring precise needle localization for targeted delivery of medication in or near specific anatomical locations not easily accessible without such real-time imaging assistance. Exposure Time: Please see nurses notes. Contrast: None used. Fluoroscopic Guidance: I was personally present during the use of fluoroscopy. Tunnel Vision Technique used to obtain the best possible view of the target area. Parallax error corrected before commencing the procedure. Direction-depth-direction technique used to introduce the needle under continuous pulsed fluoroscopy. Once target  was reached, antero-posterior, oblique, and lateral fluoroscopic projection used confirm needle placement in all planes. Images permanently stored in EMR. Interpretation: No contrast injected. I personally interpreted the imaging intraoperatively. Adequate needle placement confirmed in multiple planes. Permanent images saved into the patient's record.  Antibiotic Prophylaxis:   Anti-infectives (From admission, onward)    None      Indication(s): None identified  Post-operative Assessment:  Post-procedure Vital Signs:  Pulse/HCG Rate: 6276 Temp: (!) 97.3 F (36.3 C) Resp: 14 BP: 121/80 SpO2: 97 %  EBL: None  Complications: No immediate post-treatment complications observed by team, or reported by patient.  Note: The patient tolerated the entire procedure well. A repeat set of vitals were taken after the procedure and the patient was kept under observation following institutional policy, for this type of procedure. Post-procedural neurological assessment was performed, showing return to baseline, prior to discharge. The patient was provided with post-procedure discharge instructions, including a section on how to identify potential problems. Should any problems arise concerning this procedure, the patient was given instructions to immediately contact us , at any time, without hesitation. In any case, we plan to contact the patient by telephone for a follow-up status report regarding this interventional procedure.  Comments:  No additional relevant information.  Plan of Care (POC)  Orders:  Orders Placed This Encounter  Procedures   Radiofrequency,Lumbar    Scheduling Instructions:     Side(s): Right-sided     Level: L3-4, L4-5, and L5-S1 Facets (L2, L3, L4, L5, and S1 Medial Branch)     Sedation: With Sedation.     Date: 03/14/2024    Where will this procedure be performed?:  [facility]   DG PAIN [facility] C-ARM 1-60 MIN NO REPORT    Intraoperative interpretation by  procedural physician at [facility].    Standing Status:   Standing    Number of Occurrences:   1    Reason for exam::   Assistance in needle guidance and placement for procedures requiring needle placement in or near specific anatomical locations not easily accessible without such assistance.   Informed Consent Details: Physician/Practitioner Attestation; Transcribe to consent form and obtain patient signature    Nursing Order: Transcribe to consent form and obtain patient signature. Note: Always confirm laterality of pain with [name] Whitaker, before procedure.    Physician/Practitioner attestation of informed consent for procedure/surgical case:   I, the physician/practitioner, attest that I have discussed with the patient the benefits, risks, side effects, alternatives, likelihood of achieving goals and potential problems during recovery for the procedure that I have provided informed consent.    Procedure:   Lumbar Facet Radiofrequency Ablation    Physician/Practitioner performing the procedure:   Hayes Czaja A. Tanya, MD    Indication/Reason:   Low Back Pain, with our without leg pain, due to Facet Joint Arthralgia (Joint Pain) known as Lumbar Facet Syndrome, secondary to Lumbar, and/or Lumbosacral Spondylosis (Arthritis of the Spine), without myelopathy or radiculopathy (Nerve Damage).   Care order/instruction: Please confirm that the patient has stopped the Xarelto  (Rivaroxaban ) x 3 days prior to procedure or surgery.    Please confirm that the patient has stopped the Xarelto  (Rivaroxaban ) x 3 days prior to procedure or surgery.    Standing Status:   Standing    Number of Occurrences:   1   Provide equipment / supplies at bedside    Procedure tray: Radiofrequency Tray Additional material: Large hemostat (x1); Small hemostat (x1); Towels (x8); 4x4 sterile sponge pack (x1) Needle type: Teflon-coated Radiofrequency Needle (Disposable  single use) Size: Regular Quantity: 5     Standing Status:   Standing    Number of Occurrences:   1    Specify:   Radiofrequency Tray   Saline lock IV    Have LR [phone] mL available and administer at 125 mL/hr if patient becomes hypotensive.    Standing Status:   Standing    Number of Occurrences:   1   Bleeding precautions    Standing Status:   Standing    Number of Occurrences:   1     Opioid Analgesic: No opioid analgesics prescribed by our practice. Highest recorded MME/day: 26.67 mg/day MME/day: 0 mg/day    Medications ordered for procedure: Meds ordered this encounter  Medications   lidocaine  (XYLOCAINE ) 2 % (with pres) injection 400 mg   pentafluoroprop-tetrafluoroeth (GEBAUERS) aerosol   midazolam  (VERSED ) 5 MG/5ML injection 0.5-2 mg    Make sure Flumazenil is available in the pyxis when using this medication. If oversedation occurs, administer 0.2 mg IV over 15 sec. If after 45 sec no response, administer 0.2 mg again over 1 min; may repeat at 1 min intervals; not to exceed 4 doses (1 mg)   fentaNYL  (SUBLIMAZE ) injection 25-50 mcg    Make sure Narcan is available in the pyxis when using this medication. In the event of respiratory depression (RR< 8/min): Titrate NARCAN (naloxone) in increments of 0.1 to 0.2 mg IV at 2-3 minute intervals, until desired degree of reversal.   ropivacaine  (PF) 2 mg/mL (0.2%) (NAROPIN ) injection 9 mL   triamcinolone  acetonide (KENALOG -40) injection 40 mg   HYDROcodone -acetaminophen  (NORCO/VICODIN) 5-325 MG  tablet    Sig: Take 1 tablet by mouth every 8 (eight) hours as needed for up to 7 days for severe pain (pain score 7-10). Must last 7 days.    Dispense:  21 tablet    Refill:  0    For acute post-operative pain. Not to be refilled. Must last 7 days.   HYDROcodone -acetaminophen  (NORCO/VICODIN) 5-325 MG tablet    Sig: Take 1 tablet by mouth every 8 (eight) hours as needed for up to 7 days for severe pain (pain score 7-10). Must last for 7 days.    Dispense:  21 tablet    Refill:  0     For acute post-operative pain. Not to be refilled. Must last 7 days.   Medications administered: We administered lidocaine , pentafluoroprop-tetrafluoroeth, midazolam , fentaNYL , ropivacaine  (PF) 2 mg/mL (0.2%), and triamcinolone  acetonide.  See the medical record for exact dosing, route, and time of administration.    Interventional Therapies  Risk factors  Considerations:   NOTE: Xarelto  Anticoagulation (Stop: 3 days  Restart: 6 hours)  HTN  Stage 3a CKD  AAA  Hx. LE-DVT & PE  OSA on CPAP  [id number]     Planned  Pending:   Therapeutic bilateral lumbar facet RFA #4, starting with the right side and following with the left 2 weeks later.   Under consideration:   Therapeutic bilateral lumbar facet RFA #4 (left last done on 06/16/2022.  Right 05/24/2022)  Possible right opturator + femoral NB  Possible right opturator + femoral nerve RFA    Completed:   Diagnostic/therapeutic right buttocks TPI/MNB x1 (02/23/2023) (100/100/100/100)  Therapeutic right IA hip joint inj. x4 (08/01/2023) (100/100/100/100) Therapeutic right trochanteric bursa inj. x2 (08/01/2023) (100/100/100/100) Diagnostic bilateral SI joint Blk x2 (03/10/2020) (50/50/100 x 5 wks/0)  Diagnostic/therapeutic right SI joint Blk x4 (02/23/2023) (100/100/100/100)  Diagnostic left lumbar facet MBB x2 (05/07/2020) (100/100/100 x1 week/>75)  Therapeutic right lumbar facet block x3 (01/17/2023) (100/90/50 x 1 week)  Therapeutic left lumbar facet RFA x3 (06/16/2022) (100/100/100/100)  Therapeutic right lumbar facet RFA x3 (05/24/2022) (100/100/80/80) (by 06/16/2022 it had progressed to 100% ongoing relief)   Palliative options:   Palliative right IA hip joint inj.  Palliative bilateral SI joint Blk  Therapeutic/palliative lumbar facet RFA       Follow-up plan:   Return in about 6 weeks (around 04/25/2024) for (Face2F), (PPE).     Recent Visits Date Type Provider Dept  02/14/24 Office Visit Tanya Glisson, MD  [facility]  Showing recent visits within past 90 days and meeting all other requirements Today's Visits Date Type Provider Dept  03/14/24 Procedure visit Tanya Glisson, MD [facility]  Showing today's visits and meeting all other requirements Future Appointments Date Type Provider Dept  04/22/24 Appointment Tanya Glisson, MD [facility]  Showing future appointments within next 90 days and meeting all other requirements   Disposition: Discharge home  Discharge (Date  Time): 03/14/2024; 1127 hrs.   Primary Care Physician: [facility] Location: [facility] Note by: Glisson DELENA Tanya, MD (TTS technology used. I apologize for any typographical errors that were not detected and corrected.) Date: 03/14/2024; Time: 11:50 AM  Disclaimer:  Medicine is not an Visual merchandiser. The only guarantee in medicine is that nothing is guaranteed. It is important to note that the decision to proceed with this intervention was based on the information collected from the patient. The Data and conclusions were drawn from the patient's questionnaire, the interview, and  the physical examination. Because the information was provided in large part by the patient, it cannot be guaranteed that it has not been purposely or unconsciously manipulated. Every effort has been made to obtain as much relevant data as possible for this evaluation. It is important to note that the conclusions that lead to this procedure are derived in large part from the available data. Always take into account that the treatment will also be dependent on availability of resources and existing treatment guidelines, considered by other Pain Management Practitioners as being common knowledge and practice, at the time of the intervention. For Medico-Legal purposes, it is also important to point out that variation in procedural techniques and pharmacological choices are  the acceptable norm. The indications, contraindications, technique, and results of the above procedure should only be interpreted and judged by a Board-Certified Interventional Pain Specialist with extensive familiarity and expertise in the same exact procedure and technique.

## 2024-03-14 NOTE — Patient Instructions (Signed)
 ______________________________________________________________________    Post-Radiofrequency (RF) Discharge Instructions  You have just completed a Radiofrequency Neurotomy.  The following instructions will provide you with information and guidelines for self-care upon discharge.  If at any time you have questions or concerns please call your physician. DO NOT DRIVE YOURSELF!!  Instructions: Apply ice: Fill a plastic sandwich bag with crushed ice. Cover it with a small towel and apply to injection site. Apply for 15 minutes then remove x 15 minutes. Repeat sequence on day of procedure, until you go to bed. The purpose is to minimize swelling and discomfort after procedure. Apply heat: Apply heat to procedure site starting the day following the procedure. The purpose is to treat any soreness and discomfort from the procedure. Food intake: No eating limitations, unless stipulated above.  Nevertheless, if you have had sedation, you may experience some nausea.  In this case, it may be wise to wait at least two hours prior to resuming regular diet. Physical activities: Keep activities to a minimum for the first 8 hours after the procedure. For the first 24 hours after the procedure, do not drive a motor vehicle,  Operate heavy machinery, power tools, or handle any weapons.  Consider walking with the use of an assistive device or accompanied by an adult for the first 24 hours.  Do not drink alcoholic beverages including beer.  Do not make any important decisions or sign any legal documents. Go home and rest today.  Resume activities tomorrow, as tolerated.  Use caution in moving about as you may experience mild leg weakness.  Use caution in cooking, use of household electrical appliances and climbing steps. Driving: If you have received any sedation, you are not allowed to drive for 24 hours after your procedure. Blood thinner: Restart your blood thinner 6 hours after your procedure. (Only for those taking  blood thinners) Insulin: As soon as you can eat, you may resume your normal dosing schedule. (Only for those taking insulin) Medications: May resume pre-procedure medications.  Do not take any drugs, other than what has been prescribed to you. Infection prevention: Keep procedure site clean and dry. Post-procedure Pain Diary: Extremely important that this be done correctly and accurately. Recorded information will be used to determine the next step in treatment. Pain evaluated is that of treated area only. Do not include pain from an untreated area. Complete every hour, on the hour, for the initial 8 hours. Set an alarm to help you do this part accurately. Do not go to sleep and have it completed later. It will not be accurate. Follow-up appointment: Keep your follow-up appointment after the procedure. Usually 2-6 weeks after radiofrequency. Bring you pain diary. The information collected will be essential for your long-term care.   Expect: From numbing medicine (AKA: Local Anesthetics): Numbness or decrease in pain. Onset: Full effect within 15 minutes of injected. Duration: It will depend on the type of local anesthetic used. On the average, 1 to 8 hours.  From steroids (when added): Decrease in swelling or inflammation. Once inflammation is improved, relief of the pain will follow. Onset of benefits: Depends on the amount of swelling present. The more swelling, the longer it will take for the benefits to be seen. In some cases, up to 10 days. Duration: Steroids will stay in the system x 2 weeks. Duration of benefits will depend on multiple posibilities including persistent irritating factors. From procedure: Some discomfort is to be expected once the numbing medicine wears off. In the case of  radiofrequency procedures, this may last as long as 6-8 weeks. Additional post-procedure pain medication is provided for this. Discomfort is minimized if ice and heat are applied as instructed.  Call  if: You experience numbness and weakness that gets worse with time, as opposed to wearing off. He experience any unusual bleeding, difficulty breathing, or loss of the ability to control your bowel and bladder. (This applies to Spinal procedures only) You experience any redness, swelling, heat, red streaks, elevated temperature, fever, or any other signs of a possible infection.  Emergency Numbers: Durning business hours (Monday - Thursday, 8:00 AM - 4:00 PM) (Friday, 9:00 AM - 12:00 Noon): [phone] After hours: [phone] ______________________________________________________________________     ______________________________________________________________________    Blood Thinners  IMPORTANT NOTICE:  If you take any of these, make sure to notify the nursing staff.  Failure to do so may result in serious injury.  Recommended time intervals to stop and restart blood-thinners, before & after invasive procedures  Generic Name Brand Name Pre-procedure: Stop medication for this amount of time before your procedure: Post-procedure: Wait this amount of time after the procedure before restarting your medication:  Abciximab Reopro 15 days 2 hrs  Alteplase Activase 10 days 10 days  Anagrelide Agrylin    Apixaban Eliquis 3 days 6 hrs  Cilostazol Pletal 3 days 5 hrs  Clopidogrel Plavix 7-10 days 2 hrs  Dabigatran Pradaxa 5 days 6 hrs  Dalteparin Fragmin 24 hours 4 hrs  Dipyridamole Aggrenox 11days 2 hrs  Edoxaban Lixiana; Savaysa 3 days 2 hrs  Enoxaparin  Lovenox 24 hours 4 hrs  Eptifibatide Integrillin 8 hours 2 hrs  Fondaparinux  Arixtra 72 hours 12 hrs  Hydroxychloroquine Plaquenil 11 days   Prasugrel Effient 7-10 days 6 hrs  Reteplase Retavase 10 days 10 days  Rivaroxaban  Xarelto  3 days 6 hrs  Ticagrelor Brilinta 5-7 days 6 hrs  Ticlopidine Ticlid 10-14 days 2 hrs  Tinzaparin Innohep 24 hours 4 hrs  Tirofiban Aggrastat 8 hours 2 hrs  Warfarin Coumadin  5 days 2 hrs    Other medications with blood-thinning effects  NOTE: Consider stopping these if you have prolonged bleeding despite not taking any of the above blood thinners. Otherwise ask your provider and this will be decided on a case-by-case basis.  Product indications Generic (Brand) names Note  Cholesterol Lipitor Stop 4 days before procedure  Blood thinner (injectable) Heparin  (LMW or LMWH Heparin ) Stop 24 hours before procedure  Cancer Ibrutinib (Imbruvica) Stop 7 days before procedure  Malaria/Rheumatoid Hydroxychloroquine (Plaquenil) Stop 11 days before procedure  Thrombolytics  10 days before or after procedures   Over-the-counter (OTC) Products with blood-thinning effects  NOTE: Consider stopping these if you have prolonged bleeding despite not taking any of the above blood thinners. Otherwise ask your provider and this will be decided on a case-by-case basis.  Product Common names Stop Time  Aspirin > 325 mg Goody Powders, Excedrin, etc. 11 days  Aspirin <= 81 mg  7 days  Fish oil  4 days  Garlic supplements  7 days  Ginkgo biloba  36 hours  Ginseng  24 hours  NSAIDs Ibuprofen, Naprosyn , etc. 3 days  Vitamin E  4 days   ______________________________________________________________________

## 2024-03-15 ENCOUNTER — Telehealth: Payer: Self-pay | Admitting: *Deleted

## 2024-03-15 NOTE — Telephone Encounter (Signed)
 Post procedure call; voicemail left

## 2024-03-20 ENCOUNTER — Ambulatory Visit: Admitting: Pharmacist

## 2024-03-27 ENCOUNTER — Ambulatory Visit: Attending: Cardiology | Admitting: Pharmacist

## 2024-03-27 VITALS — BP 158/94 | HR 59

## 2024-03-27 DIAGNOSIS — I10 Essential (primary) hypertension: Secondary | ICD-10-CM

## 2024-03-27 MED ORDER — SPIRONOLACTONE 50 MG PO TABS: 50.0000 mg | ORAL_TABLET | Freq: Every day | ORAL | 2 refills | Status: AC

## 2024-03-27 NOTE — Patient Instructions (Addendum)
 Changes made by your pharmacist Robbi Blanch, PharmD at today's visit:    Instructions/Changes  (what do you need to do) Your Notes  (what you did and when you did it)  Increase spironolactone  dose from 25 mg to 50 mg daily and continue taking candesartan 32 mg,  chlorthalidone 25 mg daily, propranolol  80 mg once a day (for headaches), Norvasc  5 mg daily    Get the lab checked on Nov 19 at [facility]    Start doing some exercise eg. 10 min walks twice daily most days of the week     Bring all of your meds, your BP cuff and your record of home blood pressures to your next appointment.    HOW TO TAKE YOUR BLOOD PRESSURE AT HOME  Rest 5 minutes before taking your blood pressure.  Don't smoke or drink caffeinated beverages for at least 30 minutes before. Take your blood pressure before (not after) you eat. Sit comfortably with your back supported and both feet on the floor (don't cross your legs). Elevate your arm to heart level on a table or a desk. Use the proper sized cuff. It should fit smoothly and snugly around your bare upper arm. There should be enough room to slip a fingertip under the cuff. The bottom edge of the cuff should be 1 inch above the crease of the elbow. Ideally, take 3 measurements at one sitting and record the average.  Important lifestyle changes to control high blood pressure  Intervention  Effect on the BP  Lose extra pounds and watch your waistline Weight loss is one of the most effective lifestyle changes for controlling blood pressure. If you're overweight or obese, losing even a small amount of weight can help reduce blood pressure. Blood pressure might go down by about 1 millimeter of mercury (mm Hg) with each kilogram (about 2.2 pounds) of weight lost.  Exercise regularly As a general goal, aim for at least 30 minutes of moderate physical activity every day. Regular physical activity can lower high blood pressure by about 5 to 8 mm Hg.  Eat a healthy  diet Eating a diet rich in whole grains, fruits, vegetables, and low-fat dairy products and low in saturated fat and cholesterol. A healthy diet can lower high blood pressure by up to 11 mm Hg.  Reduce salt (sodium) in your diet Even a small reduction of sodium in the diet can improve heart health and reduce high blood pressure by about 5 to 6 mm Hg.  Limit alcohol One drink equals 12 ounces of beer, 5 ounces of wine, or 1.5 ounces of 80-proof liquor.  Limiting alcohol to less than one drink a day for women or two drinks a day for men can help lower blood pressure by about 4 mm Hg.   If you have any questions or concerns please use My Chart to send questions or call the office at [phone]
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
## 2024-03-28 ENCOUNTER — Encounter: Payer: Self-pay | Admitting: Pharmacist

## 2024-03-28 NOTE — Assessment & Plan Note (Signed)
 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

## 2024-04-21 NOTE — Progress Notes (Unsigned)
 PROVIDER NOTE: Interpretation of information contained herein should be left to medically-trained personnel. Specific patient instructions are provided elsewhere under Patient Instructions section of medical record. This document was created in part using AI and STT-dictation technology, any transcriptional errors that may result from this process are unintentional.  Patient: [name] Whitaker  Service: E/M   PCP: [facility]  DOB: 1951-01-15  DOS: 04/22/2024  Provider: Eric DELENA Como, MD  MRN: [id number]  Delivery: Face-to-face  Specialty: Interventional Pain Management  Type: Established Patient  Setting: Ambulatory outpatient facility  Specialty designation: 09  Referring Prov.: [facility]  Location: Outpatient office facility       History of present illness (HPI) [name], a 73 y.o. year old male, is here today because of his Chronic bilateral low back pain without sciatica [M54.50, G89.29]. [name]'s primary complain today is No chief complaint on file.  Pertinent problems: Mr. 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; Spondylosis of lumbar region without myelopathy or radiculopathy; Chronic pain syndrome; Abnormal MRI, lumbar spine (05/10/2023); Lumbar facet hypertrophy (Multilevel) ; Lumbar foraminal stenosis (L3-4, L4-5, L5-S1); Lumbar facet joint syndrome (Bilateral) (R>L); DDD (degenerative disc disease), lumbosacral; Chronic hip pain (Bilateral); Chronic low back pain (1ry area of Pain) (Bilateral) (R>L) w/o sciatica; Chronic sacroiliac joint pain (Bilateral) (R>L); Other intervertebral disc degeneration, lumbar region; Osteoarthritis of hip (Right); Arthritis of hip (Right); Osteoarthritis of hips (Bilateral); Somatic dysfunction of sacroiliac joints (Bilateral) (R>L); Other spondylosis, sacral and sacrococcygeal region; Migraine  with aura, not intractable, without status migrainosus; Primary osteoarthritis, left ankle and foot; Osteoarthritis of left foot; Lumbosacral spondylosis without myelopathy; Spinal stenosis of lumbar region; Grade 1 Anterolisthesis (2mm) of lumbar spine of L4/L5; Statin myopathy; Chronic hip pain (Right); Lumbar facet joint pain; Chronic gouty arthritis; Trigger point with back pain; Lumbar trigger point syndrome; Greater trochanteric bursitis of hip (Right); Pain in joint involving ankle and foot; Low back pain of over 3 months duration; Intermittent low back pain; and Multifactorial low back pain on their pertinent problem list.  Pain Assessment: Severity of   is reported as a  /10. Location:    / . Onset:  . Quality:  . Timing:  . Modifying factor(s):  SABRA Vitals:  vitals were not taken for this visit.  BMI: Estimated body mass index is 33.29 kg/m as calculated from the following:   Height as of 03/14/24: 5' 10 (1.778 m).   Weight as of 03/14/24: 232 lb (105.2 kg).  Last encounter: 02/14/2024. Last procedure: 03/14/2024.  Reason for encounter: post-procedure evaluation and assessment.   Discussed the use of AI scribe software for clinical note transcription with the patient, who gave verbal consent to proceed.  History of Present Illness          Post-Procedure Evaluation   Procedure: Lumbar Facet, Medial Branch Radiofrequency Ablation (RFA) #4  Laterality: Right (-RT)  Level: L2, L3, L4, L5, and S1 Medial Branch Level(s). These levels will denervate the L3-4, L4-5, and L5-S1 lumbar facet joints.  Imaging: Fluoroscopy-guided Spinal ([id number]) Anesthesia: Local anesthesia (1-2% Lidocaine ) Anxiolysis: IV Versed  2.0 mg Sedation: Minimal Sedation Fentanyl  1 mL (50 mcg) DOS: 03/14/2024  Performed by: Eric DELENA Como, MD  Purpose: Therapeutic/Palliative Indications: Low back pain severe enough to impact quality of life or function. Indications: 1. Chronic low back pain (1ry area of  Pain) (Bilateral) (R>L) w/o sciatica   2. Grade 1 Anterolisthesis (  2mm) of lumbar spine of L4/L5   3. Lumbar facet joint pain   4. Lumbar facet hypertrophy (Multilevel)    5. Lumbar facet joint syndrome (Bilateral) (R>L)   6. Low back pain of over 3 months duration   7. Spondylosis of lumbar region without myelopathy or radiculopathy    Pain Score: Pre-procedure: 7 /10 Post-procedure: 0-No pain/10    Effectiveness:  Initial hour after procedure:   ***. Subsequent 4-6 hours post-procedure:   ***. Analgesia past initial 6 hours:   ***. Ongoing improvement:  Analgesic:  *** Function:    ***    ROM:    ***    Interpretation: ***  Pharmacotherapy Assessment   Opioid Analgesic: No opioid analgesics prescribed by our practice. Highest recorded MME/day: 26.67 mg/day MME/day: 0 mg/day   Monitoring: [place] PMP: PDMP reviewed during this encounter.       Pharmacotherapy: No side-effects or adverse reactions reported. Compliance: No problems identified. Effectiveness: Clinically acceptable.  No notes on file  UDS:  No results found for: SUMMARY  No results found for: CBDTHCR No results found for: D8THCCBX No results found for: D9THCCBX  ROS  Constitutional: Denies any fever or chills Gastrointestinal: No reported hemesis, hematochezia, vomiting, or acute GI distress Musculoskeletal: Denies any acute onset joint swelling, redness, loss of ROM, or weakness Neurological: No reported episodes of acute onset apraxia, aphasia, dysarthria, agnosia, amnesia, paralysis, loss of coordination, or loss of consciousness  Medication Review  Cholecalciferol, Cyanocobalamin , Dulaglutide, EPINEPHrine , Omron 3 Series BP Monitor, acetaminophen , allopurinol , amLODipine , ascorbic acid, candesartan, chlorthalidone, cyclobenzaprine , diclofenac  Sodium, famotidine , fexofenadine, finasteride , fluticasone, gabapentin , hydrocortisone, ketoconazole, lidocaine , potassium chloride , propranolol ,  rivaroxaban , rosuvastatin , spironolactone , topiramate, and traMADol   History Review  Allergy: [name] is allergic to penicillins, amlodipine , atorvastatin , erenumab-aooe, lisinopril, penicillin g, and simvastatin. Drug: [name]  reports no history of drug use. Alcohol:  reports no history of alcohol use. Tobacco:  reports that he quit smoking about 40 years ago. His smoking use included pipe. He has never used smokeless tobacco. Social: [name] Whitaker  reports that he quit smoking about 40 years ago. His smoking use included pipe. He has never used smokeless tobacco. He reports that he does not drink alcohol and does not use drugs. Medical:  has a past medical history of Abnormal ECG (09/27/2013), Chest pain (09/27/2013), Dilated aortic root (09/27/2013), DVT, lower extremity (HCC) (10/27/2011), Dyspnea (06/27/2014), GERD (gastroesophageal reflux disease), Gout, History of DVT (deep vein thrombosis) (04/14/2019), History of pulmonary embolism (04/14/2019), HTN (hypertension) (10/25/2011), Hypercholesteremia, Migraine, OSA (obstructive sleep apnea), and Pre-diabetes. Surgical: [name] Whitaker  has a past surgical history that includes Appendectomy; Nasal septum surgery; Tonsillectomy; Testicle surgery; Cardiac catheterization; Back surgery; Cataract extraction w/PHACO (Right, 10/20/2020); and Cataract extraction w/PHACO (Left, 11/03/2020). Family: family history includes Clotting disorder in his brother; Hypertension in his father and mother.  Laboratory Chemistry Profile   Renal Lab Results  Component Value Date   BUN 15 08/26/2023   CREATININE 1.50 (H) 08/26/2023   BCR 9 (L) 02/25/2021   GFRAA 62 04/15/2019   GFRNONAA 50 (L) 08/26/2023    Hepatic Lab Results  Component Value Date   AST 23 08/26/2023   ALT 22 08/26/2023   ALBUMIN 3.6 08/26/2023   ALKPHOS 51 08/26/2023   LIPASE 19 03/15/2007    Electrolytes Lab Results  Component Value Date   NA 143 08/26/2023   K 4.2 08/26/2023   CL 108  08/26/2023   CALCIUM  9.4 08/26/2023   MG 2.2 04/15/2019  Bone Lab Results  Component Value Date   25OHVITD1 43 04/15/2019   25OHVITD2 <1.0 04/15/2019   25OHVITD3 43 04/15/2019    Inflammation (CRP: Acute Phase) (ESR: Chronic Phase) Lab Results  Component Value Date   CRP 2 04/15/2019   ESRSEDRATE 6 04/15/2019   LATICACIDVEN 1.5 08/26/2023         Note: Above Lab results reviewed.  Recent Imaging Review  DG PAIN CLINIC C-ARM 1-60 MIN NO REPORT Fluoro was used, but no Radiologist interpretation will be provided.  Please refer to NOTES tab for provider progress note. Note: Reviewed        Physical Exam  Vitals: There were no vitals taken for this visit. BMI: Estimated body mass index is 33.29 kg/m as calculated from the following:   Height as of 03/14/24: 5' 10 (1.778 m).   Weight as of 03/14/24: 232 lb (105.2 kg). Ideal: Patient weight not recorded General appearance: Well nourished, well developed, and well hydrated. In no apparent acute distress Mental status: Alert, oriented x 3 (person, place, & time)       Respiratory: No evidence of acute respiratory distress Eyes: PERLA   Assessment   Diagnosis Status  1. Chronic low back pain (1ry area of Pain) (Bilateral) (R>L) w/o sciatica   2. Lumbar facet joint pain   3. Lumbar facet joint syndrome (Bilateral) (R>L)   4. Low back pain of over 3 months duration   5. Postop check    Controlled Controlled Controlled   Updated Problems: No problems updated.  Plan of Care  Problem-specific:  Assessment and Plan            [name] has a current medication list which includes the following long-term medication(s): allopurinol , amlodipine , candesartan, chlorthalidone, famotidine , fexofenadine, potassium chloride , rosuvastatin , spironolactone , and topiramate.  Pharmacotherapy (Medications Ordered): No orders of the defined types were placed in this encounter.  Orders:  No orders of the defined  types were placed in this encounter.    Interventional Therapies  Risk factors  Considerations:   NOTE: Xarelto  Anticoagulation (Stop: 3 days  Restart: 6 hours)  HTN  Stage 3a CKD  AAA  Hx. LE-DVT & PE  OSA on CPAP  T2NIDDM     Planned  Pending:   Therapeutic bilateral lumbar facet RFA #4, starting with the right side and following with the left 2 weeks later.   Under consideration:   Therapeutic bilateral lumbar facet RFA #4 (left last done on 06/16/2022.  Right 05/24/2022)  Possible right opturator + femoral NB  Possible right opturator + femoral nerve RFA    Completed:   Diagnostic/therapeutic right buttocks TPI/MNB x1 (02/23/2023) (100/100/100/100)  Therapeutic right IA hip joint inj. x4 (08/01/2023) (100/100/100/100) Therapeutic right trochanteric bursa inj. x2 (08/01/2023) (100/100/100/100) Diagnostic bilateral SI joint Blk x2 (03/10/2020) (50/50/100 x 5 wks/0)  Diagnostic/therapeutic right SI joint Blk x4 (02/23/2023) (100/100/100/100)  Diagnostic left lumbar facet MBB x2 (05/07/2020) (100/100/100 x1 week/>75)  Therapeutic right lumbar facet block x3 (01/17/2023) (100/90/50 x 1 week)  Therapeutic left lumbar facet RFA x3 (06/16/2022) (100/100/100/100)  Therapeutic right lumbar facet RFA x3 (05/24/2022) (100/100/80/80) (by 06/16/2022 it had progressed to 100% ongoing relief)   Palliative options:   Palliative right IA hip joint inj.  Palliative bilateral SI joint Blk  Therapeutic/palliative lumbar facet RFA      No follow-ups on file.    Recent Visits Date Type Provider Dept  03/14/24 Procedure visit Tanya Glisson, MD [facility]  02/14/24 Office Visit  Tanya Glisson, MD [facility]  Showing recent visits within past 90 days and meeting all other requirements Future Appointments Date Type Provider Dept  04/22/24 Appointment Tanya Glisson, MD [facility]  Showing future appointments within next 90 days and meeting all  other requirements  I discussed the assessment and treatment plan with the patient. The patient was provided an opportunity to ask questions and all were answered. The patient agreed with the plan and demonstrated an understanding of the instructions.  Patient advised to call back or seek an in-person evaluation if the symptoms or condition worsens.  Duration of encounter: *** minutes.  Total time on encounter, as per AMA guidelines included both the face-to-face and non-face-to-face time personally spent by the physician and/or other qualified health care professional(s) on the day of the encounter (includes time in activities that require the physician or other qualified health care professional and does not include time in activities normally performed by clinical staff). Physician's time may include the following activities when performed: Preparing to see the patient (e.g., pre-charting review of records, searching for previously ordered imaging, lab work, and nerve conduction tests) Review of prior analgesic pharmacotherapies. Reviewing PMP Interpreting ordered tests (e.g., lab work, imaging, nerve conduction tests) Performing post-procedure evaluations, including interpretation of diagnostic procedures Obtaining and/or reviewing separately obtained history Performing a medically appropriate examination and/or evaluation Counseling and educating the patient/family/caregiver Ordering medications, tests, or procedures Referring and communicating with other health care professionals (when not separately reported) Documenting clinical information in the electronic or other health record Independently interpreting results (not separately reported) and communicating results to the patient/ family/caregiver Care coordination (not separately reported)  Note by: Glisson DELENA Tanya, MD (TTS and AI technology used. I apologize for any typographical errors that were not detected and  corrected.) Date: 04/22/2024; Time: 10:37 AM

## 2024-04-22 ENCOUNTER — Ambulatory Visit: Attending: Pain Medicine | Admitting: Pain Medicine

## 2024-04-22 ENCOUNTER — Encounter: Payer: Self-pay | Admitting: Pain Medicine

## 2024-04-22 VITALS — BP 126/78 | HR 126 | Temp 98.1°F | Resp 16 | Ht 70.0 in | Wt 230.0 lb

## 2024-04-22 DIAGNOSIS — M47816 Spondylosis without myelopathy or radiculopathy, lumbar region: Secondary | ICD-10-CM | POA: Diagnosis not present

## 2024-04-22 DIAGNOSIS — M5459 Other low back pain: Secondary | ICD-10-CM | POA: Diagnosis not present

## 2024-04-22 DIAGNOSIS — M4316 Spondylolisthesis, lumbar region: Secondary | ICD-10-CM | POA: Insufficient documentation

## 2024-04-22 DIAGNOSIS — M545 Low back pain, unspecified: Secondary | ICD-10-CM | POA: Diagnosis not present

## 2024-04-22 DIAGNOSIS — Z09 Encounter for follow-up examination after completed treatment for conditions other than malignant neoplasm: Secondary | ICD-10-CM | POA: Insufficient documentation

## 2024-04-22 DIAGNOSIS — G8929 Other chronic pain: Secondary | ICD-10-CM | POA: Insufficient documentation

## 2024-04-22 NOTE — Patient Instructions (Signed)

## 2024-04-24 ENCOUNTER — Ambulatory Visit: Attending: Cardiology | Admitting: Pharmacist

## 2024-04-24 ENCOUNTER — Encounter: Payer: Self-pay | Admitting: Pharmacist

## 2024-04-24 VITALS — BP 126/85 | HR 64

## 2024-04-24 DIAGNOSIS — I10 Essential (primary) hypertension: Secondary | ICD-10-CM
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
## 2024-04-24 NOTE — Assessment & Plan Note (Signed)
 Assessment: In office BP today was 126/85 heart rate 64  Takes current BP meds and tolerates them well without any side effects Denies SOB, palpitation, chest pain, headaches,or swelling Amlodipine  higher dose was causing lower legs swelling however tolerates 5 mg dose well without any problem   Home BP mostly at goal post spironolactone  dose increment BMP due today    Plan:  Continue taking candesartan 32 mg, , propranolol  120 mg twice daily  (for headaches and BP) amlodipine  5 mg daily,chlorthalidone 25 mg daily and spironolactone  50 mg daily  Patient to keep record of BP readings with heart rate and report to us  at the next visit in 12 weeks  Follow up lab(s): BMP today to assess kidney function and K level post MRA dose adjustment

## 2024-07-03 ENCOUNTER — Ambulatory Visit: Admitting: Pharmacist
